# Patient Record
Sex: Female | Born: 1960 | Race: White | Hispanic: No
Health system: Southern US, Community
[De-identification: ages and names within clinical notes are randomized; demographics above are authoritative.]

## PROBLEM LIST (undated history)

## (undated) DIAGNOSIS — F419 Anxiety disorder, unspecified: Secondary | ICD-10-CM

## (undated) DIAGNOSIS — R7303 Prediabetes: Secondary | ICD-10-CM

## (undated) DIAGNOSIS — J449 Chronic obstructive pulmonary disease, unspecified: Secondary | ICD-10-CM

## (undated) DIAGNOSIS — I10 Essential (primary) hypertension: Secondary | ICD-10-CM

## (undated) DIAGNOSIS — E7800 Pure hypercholesterolemia, unspecified: Secondary | ICD-10-CM

## (undated) DIAGNOSIS — K219 Gastro-esophageal reflux disease without esophagitis: Secondary | ICD-10-CM

## (undated) DIAGNOSIS — J45909 Unspecified asthma, uncomplicated: Secondary | ICD-10-CM

## (undated) HISTORY — PX: TUBAL LIGATION: SHX77

---

## 2002-07-17 ENCOUNTER — Emergency Department (HOSPITAL_COMMUNITY): Admission: EM | Admit: 2002-07-17 | Discharge: 2002-07-18 | Payer: Self-pay | Admitting: Emergency Medicine

## 2002-08-24 ENCOUNTER — Encounter: Payer: Self-pay | Admitting: Emergency Medicine

## 2002-08-24 ENCOUNTER — Emergency Department (HOSPITAL_COMMUNITY): Admission: EM | Admit: 2002-08-24 | Discharge: 2002-08-24 | Payer: Self-pay | Admitting: Emergency Medicine

## 2002-10-20 ENCOUNTER — Emergency Department (HOSPITAL_COMMUNITY): Admission: EM | Admit: 2002-10-20 | Discharge: 2002-10-20 | Payer: Self-pay | Admitting: Emergency Medicine

## 2002-11-06 ENCOUNTER — Emergency Department (HOSPITAL_COMMUNITY): Admission: AD | Admit: 2002-11-06 | Discharge: 2002-11-06 | Payer: Self-pay | Admitting: Emergency Medicine

## 2002-11-19 ENCOUNTER — Emergency Department (HOSPITAL_COMMUNITY): Admission: EM | Admit: 2002-11-19 | Discharge: 2002-11-20 | Payer: Self-pay | Admitting: Emergency Medicine

## 2004-08-19 ENCOUNTER — Emergency Department (HOSPITAL_COMMUNITY): Admission: EM | Admit: 2004-08-19 | Discharge: 2004-08-19 | Payer: Self-pay | Admitting: Emergency Medicine

## 2004-08-31 ENCOUNTER — Emergency Department (HOSPITAL_COMMUNITY): Admission: EM | Admit: 2004-08-31 | Discharge: 2004-09-01 | Payer: Self-pay | Admitting: Emergency Medicine

## 2004-09-24 ENCOUNTER — Emergency Department (HOSPITAL_COMMUNITY): Admission: EM | Admit: 2004-09-24 | Discharge: 2004-09-24 | Payer: Self-pay | Admitting: Emergency Medicine

## 2017-10-24 ENCOUNTER — Encounter (HOSPITAL_COMMUNITY): Payer: Self-pay

## 2017-10-24 ENCOUNTER — Other Ambulatory Visit: Payer: Self-pay

## 2017-10-24 ENCOUNTER — Emergency Department (HOSPITAL_COMMUNITY)
Admission: EM | Admit: 2017-10-24 | Discharge: 2017-10-24 | Disposition: A | Discharge status: Home / Self Care | Payer: Medicare Other | Attending: Emergency Medicine | Admitting: Emergency Medicine

## 2017-10-24 DIAGNOSIS — Z5321 Procedure and treatment not carried out due to patient leaving prior to being seen by health care provider: Secondary | ICD-10-CM | POA: Diagnosis not present

## 2017-10-24 DIAGNOSIS — M79672 Pain in left foot: Secondary | ICD-10-CM | POA: Insufficient documentation

## 2017-10-24 HISTORY — DX: Essential (primary) hypertension: I10

## 2017-10-24 HISTORY — DX: Unspecified asthma, uncomplicated: J45.909

## 2017-10-24 HISTORY — DX: Pure hypercholesterolemia, unspecified: E78.00

## 2017-10-24 NOTE — ED Triage Notes (Addendum)
Pt endorses her left foot sliding off of her motorized wheelchair yesterday by accident and twisting her left ankle. Swelling noted and unable to bear full weight on it. PMS intact. Pt went to [facility] yesterday and was told ankle x-ray was negative. VSS.

## 2017-10-24 NOTE — ED Notes (Signed)
Called pt to check vitals. No response.

## 2017-10-28 ENCOUNTER — Other Ambulatory Visit: Payer: Self-pay

## 2017-10-28 ENCOUNTER — Ambulatory Visit (HOSPITAL_COMMUNITY)
Admission: EM | Admit: 2017-10-28 | Discharge: 2017-10-28 | Disposition: A | Discharge status: Home / Self Care | Payer: Medicare Other | Attending: Family Medicine | Admitting: Family Medicine

## 2017-10-28 ENCOUNTER — Encounter (HOSPITAL_COMMUNITY): Payer: Self-pay | Admitting: *Deleted

## 2017-10-28 ENCOUNTER — Ambulatory Visit (INDEPENDENT_AMBULATORY_CARE_PROVIDER_SITE_OTHER): Payer: Medicare Other

## 2017-10-28 DIAGNOSIS — S93402A Sprain of unspecified ligament of left ankle, initial encounter: Secondary | ICD-10-CM

## 2017-10-28 MED ORDER — NAPROXEN 375 MG PO TABS: 375.0000 mg | ORAL_TABLET | Freq: Two times a day (BID) | ORAL | 0 refills | Status: DC

## 2017-10-28 NOTE — ED Triage Notes (Addendum)
Reports getting left foot caught in wheelchair approx 2 wks ago.  States not much pain, but c/o continued intermittent swelling. Pain in left lateral ankle, not foot.

## 2017-10-28 NOTE — Discharge Instructions (Addendum)
Ice and elevate the ankle. Naproxen twice a day, take with food, don't take with additional ibuprofen. Activity as tolerated. See exercises. If worsening or no improvement please follow up with your primary care provider and/or sports medicine.

## 2017-10-28 NOTE — ED Provider Notes (Signed)
[facility]    CSN: [id number] Arrival date & time: 10/28/17  1720     History   Chief Complaint Chief Complaint  Patient presents with  . Foot Injury    HPI Ashley Hodge is a 57 y.o. female.   Ashley Hodge presents with complaints of left lateral ankle pain after injury approximately 6/9. Was seen in ER on 6/10 with negative foot films. Still has swelling. Occasional pain, worse with touching and some pain with ambulatory. States her foot caught under her wheelchair causing it to twist and hyperextend before she could reverse. No numbness or tingling. Pain 6/10. Has taken ibuprofen intermittently which has not helped, none today. No previous ankle or foot injury. Hx of asthma, htn, high cholesterol.   ROS per HPI.      Past Medical History:  Diagnosis Date  . Asthma   . High cholesterol   . Hypertension     There are no active problems to display for this patient.   Past Surgical History:  Procedure Laterality Date  . TUBAL LIGATION      OB History   None      Home Medications    Prior to Admission medications   Medication Sig Start Date End Date Taking? Authorizing Provider  amLODipine (NORVASC) 5 MG tablet Take 5 mg by mouth daily. 09/07/17  Yes [provider]  hydrochlorothiazide (HYDRODIURIL) 12.5 MG tablet Take 12.5 mg by mouth daily. 09/07/17  Yes [provider]  losartan (COZAAR) 50 MG tablet Take 50 mg by mouth daily. 09/07/17  Yes [provider]  pravastatin (PRAVACHOL) 80 MG tablet Take 80 mg by mouth daily. 09/07/17  Yes [provider]  PROAIR HFA 108 (90 Base) MCG/ACT inhaler Inhale 2 puffs into the lungs every 6 (six) hours as needed for wheezing. 09/07/17  Yes [provider]  naproxen (NAPROSYN) 375 MG tablet Take 1 tablet (375 mg total) by mouth 2 (two) times daily. 10/28/17   Georgetta Haber, NP    Family History Family History  Problem Relation Age of Onset  . Heart disease Mother   .  Hypertension Mother     Social History Social History   Tobacco Use  . Smoking status: Current Every Day Smoker    Packs/day: 1.00    Types: Cigarettes  . Smokeless tobacco: Never Used  Substance Use Topics  . Alcohol use: Not Currently    Frequency: Never    Comment: "clean x 4 yrs"  . Drug use: Never     Allergies   Lisinopril   Review of Systems Review of Systems   Physical Exam Triage Vital Signs ED Triage Vitals [10/28/17 1841]  Enc Vitals Group     BP 126/61     Pulse Rate 90     Resp 16     Temp 98.7 F (37.1 C)     Temp Source Oral     SpO2 99 %     Weight      Height      Head Circumference      Peak Flow      Pain Score      Pain Loc      Pain Edu?      Excl. in GC?    No data found.  Updated Vital Signs BP 126/61   Pulse 90   Temp 98.7 F (37.1 C) (Oral)   Resp 16   SpO2 99%   Physical Exam  Constitutional: She is oriented  to person, place, and time. She appears well-developed and well-nourished. No distress.  Cardiovascular: Normal rate, regular rhythm and normal heart sounds.  Pulmonary/Chest: Effort normal and breath sounds normal.  Musculoskeletal:       Left ankle: She exhibits swelling and ecchymosis. She exhibits normal range of motion, no deformity, no laceration and normal pulse. Tenderness. Lateral malleolus tenderness found. Achilles tendon normal.       Left foot: Normal.       Feet:  Tenderness to lateral malleolus and surrounding soft tissue with bruising and swelling noted; Full ROM noted   Neurological: She is alert and oriented to person, place, and time.  Skin: Skin is warm and dry.     UC Treatments / Results  Labs (all labs ordered are listed, but only abnormal results are displayed) Labs Reviewed - No data to display  EKG None  Radiology Dg Ankle Complete Left  Result Date: 10/28/2017 CLINICAL DATA:  Injury 2 weeks ago, lateral malleolar pain. Swelling. EXAM: LEFT ANKLE COMPLETE - 3+ VIEW COMPARISON:   None. FINDINGS: There is no evidence of fracture, dislocation, or joint effusion. There is no evidence of arthropathy or other focal bone abnormality. Soft tissues are unremarkable. Incidental note made of chronic spurring at the dorsal and plantar margins of the posterior calcaneus. IMPRESSION: No acute findings.  No osseous fracture or dislocation. Electronically Signed   By: Bary RichardStan  Maynard M.D.   On: 10/28/2017 19:13    Procedures Procedures (including critical care time)  Medications Ordered in UC Medications - No data to display  Initial Impression / Assessment and Plan / UC Course  I have reviewed the triage vital signs and the nursing notes.  Pertinent labs & imaging results that were available during my care of the patient were reviewed by me and considered in my medical decision making (see chart for details).     Xray without acute findings. Physical and xray consistent with strain. Ice, elevation, brace, ibuprofen for pain control. Patient verbalized understanding and agreeable to plan.    Final Clinical Impressions(s) / UC Diagnoses   Final diagnoses:  Sprain of left ankle, unspecified ligament, initial encounter     Discharge Instructions     Ice and elevate the ankle. Naproxen twice a day, take with food, don't take with additional ibuprofen. Activity as tolerated. See exercises. If worsening or no improvement please follow up with your primary care provider and/or sports medicine.     ED Prescriptions    Medication Sig Dispense Auth. Provider   naproxen (NAPROSYN) 375 MG tablet Take 1 tablet (375 mg total) by mouth 2 (two) times daily. 20 tablet Georgetta HaberBurky, Natalie B, NP     Controlled Substance Prescriptions [place] Controlled Substance Registry consulted? Not Applicable   Georgetta HaberBurky, Natalie B, NP 10/28/17 [phone]

## 2017-12-14 NOTE — ED Provider Notes (Addendum)
  [facility]   Error      Doreene ElandEniola, Azalynn Maxim T, MD 12/14/17 [phone]

## 2017-12-25 ENCOUNTER — Encounter (HOSPITAL_COMMUNITY): Payer: Self-pay | Admitting: Emergency Medicine

## 2017-12-25 ENCOUNTER — Other Ambulatory Visit: Payer: Self-pay

## 2017-12-25 ENCOUNTER — Emergency Department (HOSPITAL_COMMUNITY)
Admission: EM | Admit: 2017-12-25 | Discharge: 2017-12-25 | Disposition: A | Discharge status: Home / Self Care | Payer: Medicare Other | Attending: Emergency Medicine | Admitting: Emergency Medicine

## 2017-12-25 DIAGNOSIS — I10 Essential (primary) hypertension: Secondary | ICD-10-CM | POA: Diagnosis not present

## 2017-12-25 DIAGNOSIS — Z79899 Other long term (current) drug therapy: Secondary | ICD-10-CM | POA: Insufficient documentation

## 2017-12-25 DIAGNOSIS — F419 Anxiety disorder, unspecified: Secondary | ICD-10-CM | POA: Insufficient documentation

## 2017-12-25 DIAGNOSIS — F17210 Nicotine dependence, cigarettes, uncomplicated: Secondary | ICD-10-CM | POA: Diagnosis not present

## 2017-12-25 DIAGNOSIS — J45909 Unspecified asthma, uncomplicated: Secondary | ICD-10-CM | POA: Insufficient documentation

## 2017-12-25 NOTE — Discharge Instructions (Addendum)
Please read attached information. If you experience any new or worsening signs or symptoms please return to the emergency room for evaluation. Please follow-up with your primary care provider or specialist as discussed.  °

## 2017-12-25 NOTE — ED Provider Notes (Signed)
[facility] Provider Note   CSN: [id number] Arrival date & time: 12/25/17  1726     History   Chief Complaint Chief Complaint  Patient presents with  . Tachycardia  . Hypertension  . Anxiety    HPI Ashley Hodge is a 57 y.o. female.  HPI    57 year old female presents today with complaints of anxiety.  Patient notes her blood pressure was elevated today along with her heart rate.  She notes this was after reading effects on medications that were prescribed to her.  Patient also notes that she is homeless and has significant anxiety about this.  Patient notes a history of same in the past, she has had cardiac work-ups for similar presentations with no significant findings.  She denies any present chest pain or shortness of breath.  No  history of DVT in the past.   Past Medical History:  Diagnosis Date  . Asthma   . High cholesterol   . Hypertension     There are no active problems to display for this patient.   Past Surgical History:  Procedure Laterality Date  . TUBAL LIGATION       OB History   None      Home Medications    Prior to Admission medications   Medication Sig Start Date End Date Taking? Authorizing Provider  amLODipine (NORVASC) 5 MG tablet Take 5 mg by mouth daily. 09/07/17   [provider]  hydrochlorothiazide (HYDRODIURIL) 12.5 MG tablet Take 12.5 mg by mouth daily. 09/07/17   [provider]  losartan (COZAAR) 50 MG tablet Take 50 mg by mouth daily. 09/07/17   [provider]  naproxen (NAPROSYN) 375 MG tablet Take 1 tablet (375 mg total) by mouth 2 (two) times daily. 10/28/17   Georgetta HaberBurky, Natalie B, NP  pravastatin (PRAVACHOL) 80 MG tablet Take 80 mg by mouth daily. 09/07/17   [provider]  PROAIR HFA 108 (90 Base) MCG/ACT inhaler Inhale 2 puffs into the lungs every 6 (six) hours as needed for wheezing. 09/07/17   [provider]    Family History Family History   Problem Relation Age of Onset  . Heart disease Mother   . Hypertension Mother     Social History Social History   Tobacco Use  . Smoking status: Current Every Day Smoker    Packs/day: 1.00    Types: Cigarettes  . Smokeless tobacco: Never Used  Substance Use Topics  . Alcohol use: Not Currently    Frequency: Never    Comment: "clean x 4 yrs"  . Drug use: Never     Allergies   Lisinopril   Review of Systems Review of Systems  All other systems reviewed and are negative.    Physical Exam Updated Vital Signs BP 112/70 (BP Location: Left Arm)   Pulse 83   Temp 97.7 F (36.5 C) (Oral)   Resp 15   Ht 5' (1.524 m)   Wt 67.1 kg   SpO2 95%   BMI 28.90 kg/m   Physical Exam  Constitutional: She is oriented to person, place, and time. She appears well-developed and well-nourished.  HENT:  Head: Normocephalic and atraumatic.  Eyes: Pupils are equal, round, and reactive to light. Conjunctivae are normal. Right eye exhibits no discharge. Left eye exhibits no discharge. No scleral icterus.  Neck: Normal range of motion. No JVD present. No tracheal deviation present.  Cardiovascular: Normal rate, regular rhythm, normal heart sounds and intact distal pulses.  Pulmonary/Chest: Effort normal and breath sounds normal. No stridor. No respiratory distress. She has no wheezes. She has no rales. She exhibits no tenderness.  Neurological: She is alert and oriented to person, place, and time. Coordination normal.  Psychiatric: She has a normal mood and affect. Her behavior is normal. Judgment and thought content normal.  Nursing note and vitals reviewed.    ED Treatments / Results  Labs (all labs ordered are listed, but only abnormal results are displayed) Labs Reviewed - No data to display  EKG None  Radiology No results found.  Procedures Procedures (including critical care time)  Medications Ordered in ED Medications - No data to display   Initial Impression /  Assessment and Plan / ED Course  I have reviewed the triage vital signs and the nursing notes.  Pertinent labs & imaging results that were available during my care of the patient were reviewed by me and considered in my medical decision making (see chart for details).      Final Clinical Impressions(s) / ED Diagnoses   Final diagnoses:  Anxiety    Labs:   Imaging:  Consults:  Therapeutics:  Discharge Meds:   Assessment/Plan: 57 year old female presents today with complaints of anxiety. She has no pulmonary or cardiac etiology here today, vital signs are reassuring. Patient discharged with outpatient follow-up and return precautions. She verbalized understanding and agreement to today's plan.     ED Discharge Orders    None       Rosalio LoudHedges, Carlise Stofer, PA-C 12/25/17 2159    Loren RacerYelverton, David, MD 12/28/17 1536

## 2017-12-25 NOTE — ED Triage Notes (Addendum)
Pt checked her BP and pulse at home and noticed it was high. Pt reports BP 141/82 and P 104. Pt denies pain. Pt believes this is anxiety. Pt is homeless.

## 2017-12-27 ENCOUNTER — Encounter (HOSPITAL_COMMUNITY): Payer: Self-pay

## 2017-12-27 ENCOUNTER — Emergency Department (HOSPITAL_COMMUNITY)
Admission: EM | Admit: 2017-12-27 | Discharge: 2017-12-27 | Disposition: A | Discharge status: Home / Self Care | Payer: Medicare Other | Attending: Emergency Medicine | Admitting: Emergency Medicine

## 2017-12-27 ENCOUNTER — Emergency Department (HOSPITAL_COMMUNITY): Payer: Medicare Other

## 2017-12-27 ENCOUNTER — Other Ambulatory Visit: Payer: Self-pay

## 2017-12-27 DIAGNOSIS — F17210 Nicotine dependence, cigarettes, uncomplicated: Secondary | ICD-10-CM | POA: Insufficient documentation

## 2017-12-27 DIAGNOSIS — M546 Pain in thoracic spine: Secondary | ICD-10-CM

## 2017-12-27 DIAGNOSIS — J45909 Unspecified asthma, uncomplicated: Secondary | ICD-10-CM | POA: Insufficient documentation

## 2017-12-27 DIAGNOSIS — I10 Essential (primary) hypertension: Secondary | ICD-10-CM | POA: Diagnosis not present

## 2017-12-27 DIAGNOSIS — Z79899 Other long term (current) drug therapy: Secondary | ICD-10-CM | POA: Diagnosis not present

## 2017-12-27 MED ORDER — IBUPROFEN 600 MG PO TABS: 600.0000 mg | ORAL_TABLET | Freq: Three times a day (TID) | ORAL | 0 refills | Status: DC | PRN

## 2017-12-27 MED ORDER — IBUPROFEN 200 MG PO TABS
600.0000 mg | ORAL_TABLET | Freq: Once | ORAL | Status: AC
  Administered 2017-12-27: 600 mg via ORAL
  Filled 2017-12-27: qty 3

## 2017-12-27 NOTE — ED Notes (Signed)
Patient transported to X-ray 

## 2017-12-27 NOTE — ED Provider Notes (Signed)
[facility]-EMERGENCY DEPT Provider Note   CSN: [id number] Arrival date & time: 12/27/17  1016     History   Chief Complaint Chief Complaint  Patient presents with  . Back Pain  . Shoulder Pain    HPI Ashley Hodge is a 57 y.o. female.  HPI  57 year old female presents with left-sided back pain.  Started this morning around 7 AM.  She was previously up and feeling no pain when it seemed all of a sudden start.  She is wondering if she slept on her back wrong as she frequently sleeps on her left side.  She is also having some neck pain.  There is no pleuritic pain and it does seem a little worse with movement.  She has a chronic cough from smoking but no new cough or shortness of breath.  No chest pain.  No weakness or numbness in her extremities.  After this started she took a muscle relaxer that she does not know the name of at her motel but states that it has not helped and made her little nauseated.  No urinary symptoms.  She has also been feeling on and off anxious, currently at this time I am talking to her she states she is not feeling anxious but it seems to come and go without obvious cause.  No suicidal thoughts.  Past Medical History:  Diagnosis Date  . Asthma   . High cholesterol   . Hypertension     There are no active problems to display for this patient.   Past Surgical History:  Procedure Laterality Date  . TUBAL LIGATION       OB History   None      Home Medications    Prior to Admission medications   Medication Sig Start Date End Date Taking? Authorizing Provider  amLODipine (NORVASC) 5 MG tablet Take 5 mg by mouth daily. 09/07/17   [provider]  hydrochlorothiazide (HYDRODIURIL) 12.5 MG tablet Take 12.5 mg by mouth daily. 09/07/17   [provider]  losartan (COZAAR) 50 MG tablet Take 50 mg by mouth daily. 09/07/17   [provider]  naproxen (NAPROSYN) 375 MG tablet Take 1 tablet (375 mg total) by mouth  2 (two) times daily. 10/28/17   Georgetta Haber, NP  pravastatin (PRAVACHOL) 80 MG tablet Take 80 mg by mouth daily. 09/07/17   [provider]  PROAIR HFA 108 (90 Base) MCG/ACT inhaler Inhale 2 puffs into the lungs every 6 (six) hours as needed for wheezing. 09/07/17   [provider]    Family History Family History  Problem Relation Age of Onset  . Heart disease Mother   . Hypertension Mother     Social History Social History   Tobacco Use  . Smoking status: Current Every Day Smoker    Packs/day: 1.00    Types: Cigarettes  . Smokeless tobacco: Never Used  Substance Use Topics  . Alcohol use: Not Currently    Frequency: Never    Comment: "clean x 4 yrs"  . Drug use: Never     Allergies   Lisinopril   Review of Systems Review of Systems  Respiratory: Positive for cough. Negative for shortness of breath.   Cardiovascular: Negative for chest pain.  Gastrointestinal: Positive for nausea. Negative for abdominal pain and vomiting.  Musculoskeletal: Positive for back pain and neck pain.  Neurological: Negative for weakness and numbness.  Psychiatric/Behavioral: Negative for suicidal ideas. The patient is nervous/anxious.   All  other systems reviewed and are negative.    Physical Exam Updated Vital Signs BP 127/80   Pulse (!) 110   Temp 98.5 F (36.9 C) (Oral)   Resp 16   Ht 5' (1.524 m)   Wt 67.1 kg   SpO2 97%   BMI 28.90 kg/m   Physical Exam  Constitutional: She is oriented to person, place, and time. She appears well-developed and well-nourished. No distress.  HENT:  Head: Normocephalic and atraumatic.  Right Ear: External ear normal.  Left Ear: External ear normal.  Nose: Nose normal.  Eyes: Right eye exhibits no discharge. Left eye exhibits no discharge.  Neck: Normal range of motion. Neck supple. Muscular tenderness present.    Cardiovascular: Normal rate, regular rhythm and normal heart sounds.  HR~100  Pulmonary/Chest: Effort  normal and breath sounds normal.  Abdominal: Soft. There is no tenderness. There is no CVA tenderness.  Musculoskeletal:       Cervical back: She exhibits tenderness.       Thoracic back: She exhibits tenderness.       Back:  Neurological: She is alert and oriented to person, place, and time.  5/5 strength in all 4 extremities.   Skin: Skin is warm and dry. She is not diaphoretic.  Nursing note and vitals reviewed.    ED Treatments / Results  Labs (all labs ordered are listed, but only abnormal results are displayed) Labs Reviewed - No data to display  EKG EKG Interpretation  Date/Time:  Wednesday December 27 2017 11:15:29 EDT Ventricular Rate:  83 PR Interval:    QRS Duration: 83 QT Interval:  361 QTC Calculation: 425 R Axis:   15 Text Interpretation:  Sinus rhythm Low voltage, precordial leads Abnormal R-wave progression, early transition tachycardia resolved, otherwise no significant change since Dec 25 2017 Confirmed by Pricilla LovelessGoldston, Noemie Devivo [phone]([id number]) on 12/27/2017 11:19:27 AM   Radiology Dg Chest 2 View  Result Date: 12/27/2017 CLINICAL DATA:  Onset back and left arm pain this morning. EXAM: CHEST - 2 VIEW COMPARISON:  None. FINDINGS: The lungs are clear. Heart size is normal. No pneumothorax or pleural effusion. Aortic atherosclerosis is identified. No acute or focal abnormality. IMPRESSION: No acute disease. Atherosclerosis. Electronically Signed   By: Drusilla Kannerhomas  Dalessio M.D.   On: 12/27/2017 11:10    Procedures Procedures (including critical care time)  Medications Ordered in ED Medications  ibuprofen (ADVIL,MOTRIN) tablet 600 mg (600 mg Oral Given 12/27/17 1054)     Initial Impression / Assessment and Plan / ED Course  I have reviewed the triage vital signs and the nursing notes.  Pertinent labs & imaging results that were available during my care of the patient were reviewed by me and considered in my medical decision making (see chart for details).     Patient's  pain is most likely muscular.  It is worse with range of motion and palpation.  While she was initially tachycardic on arrival, after she has rested her heart rate is now in the 80s.  She does endorse some anxiety just prior to arrival which probably drove up her heart rate.  There is no pleuritic pain and no shortness of breath or hypoxia.  I highly doubt PE.  I do not think work-up for ACS or PE is needed, especially with benign appearing ECG.  Ibuprofen will be added to her muscle relaxer and recommend she follow-up with PCP.  Return precautions.  Final Clinical Impressions(s) / ED Diagnoses   Final diagnoses:  Acute left-sided thoracic  back pain    ED Discharge Orders    None       Pricilla LovelessGoldston, Yesenia Fontenette, MD 12/27/17 1143

## 2017-12-27 NOTE — ED Triage Notes (Signed)
Pt arrives via POV from home. Pt reports that she woke today with upper back and neck pain and left shoulder pain. Pt denies injury or trauma. Pt reports that she is a left sided sleeper and she thought that was contributing to her pain. Pt reports anxiety at this time.

## 2017-12-27 NOTE — Discharge Instructions (Signed)
If your back pain worsens, you develop chest pain, shortness of breath, fever, or weakness or numbness in your arms or legs, abdominal pain, or urinary or bowel incontinence or any other new/concerning symptoms or return to the ER for evaluation.  Otherwise follow-up with your primary care physician.

## 2017-12-28 ENCOUNTER — Emergency Department (HOSPITAL_COMMUNITY)
Admission: EM | Admit: 2017-12-28 | Discharge: 2017-12-28 | Disposition: A | Discharge status: Home / Self Care | Payer: Medicare Other | Attending: Emergency Medicine | Admitting: Emergency Medicine

## 2017-12-28 DIAGNOSIS — F17210 Nicotine dependence, cigarettes, uncomplicated: Secondary | ICD-10-CM | POA: Diagnosis not present

## 2017-12-28 DIAGNOSIS — J45909 Unspecified asthma, uncomplicated: Secondary | ICD-10-CM | POA: Diagnosis not present

## 2017-12-28 DIAGNOSIS — Z79899 Other long term (current) drug therapy: Secondary | ICD-10-CM | POA: Insufficient documentation

## 2017-12-28 DIAGNOSIS — I10 Essential (primary) hypertension: Secondary | ICD-10-CM | POA: Diagnosis not present

## 2017-12-28 DIAGNOSIS — R42 Dizziness and giddiness: Secondary | ICD-10-CM | POA: Diagnosis present

## 2017-12-28 LAB — CBC WITH DIFFERENTIAL/PLATELET
Abs Immature Granulocytes: 0 10*3/uL (ref 0.0–0.1)
Basophils Absolute: 0.1 10*3/uL (ref 0.0–0.1)
Basophils Relative: 1 %
Eosinophils Absolute: 0.4 10*3/uL (ref 0.0–0.7)
Eosinophils Relative: 3 %
HEMATOCRIT: 40.7 % (ref 36.0–46.0)
HEMOGLOBIN: 12.9 g/dL (ref 12.0–15.0)
Immature Granulocytes: 0 %
LYMPHS ABS: 2.1 10*3/uL (ref 0.7–4.0)
LYMPHS PCT: 18 %
MCH: 30.5 pg (ref 26.0–34.0)
MCHC: 31.7 g/dL (ref 30.0–36.0)
MCV: 96.2 fL (ref 78.0–100.0)
MONO ABS: 0.8 10*3/uL (ref 0.1–1.0)
Monocytes Relative: 7 %
Neutro Abs: 8.4 10*3/uL — ABNORMAL HIGH (ref 1.7–7.7)
Neutrophils Relative %: 71 %
Platelets: 293 10*3/uL (ref 150–400)
RBC: 4.23 MIL/uL (ref 3.87–5.11)
RDW: 11.8 % (ref 11.5–15.5)
WBC: 11.9 10*3/uL — ABNORMAL HIGH (ref 4.0–10.5)

## 2017-12-28 LAB — I-STAT TROPONIN, ED
Troponin i, poc: 0 ng/mL (ref 0.00–0.08)
Troponin i, poc: 0 ng/mL (ref 0.00–0.08)

## 2017-12-28 LAB — BASIC METABOLIC PANEL
Anion gap: 7 (ref 5–15)
BUN: 9 mg/dL (ref 6–20)
CHLORIDE: 106 mmol/L (ref 98–111)
CO2: 24 mmol/L (ref 22–32)
CREATININE: 0.76 mg/dL (ref 0.44–1.00)
Calcium: 8.7 mg/dL — ABNORMAL LOW (ref 8.9–10.3)
GFR calc Af Amer: >60 mL/min (ref >60)
GLUCOSE: 104 mg/dL — AB (ref 70–99)
POTASSIUM: 3.6 mmol/L (ref 3.5–5.1)
Sodium: 137 mmol/L (ref 135–145)

## 2017-12-28 LAB — D-DIMER, QUANTITATIVE: D-Dimer, Quant: 0.51 ug/mL-FEU — ABNORMAL HIGH (ref 0.00–0.50)

## 2017-12-28 MED ORDER — SODIUM CHLORIDE 0.9 % IV BOLUS
1000.0000 mL | Freq: Once | INTRAVENOUS | Status: AC
  Administered 2017-12-28: 1000 mL via INTRAVENOUS

## 2017-12-28 NOTE — ED Notes (Signed)
Pt ambulated to lobby after being given d/c paperwork. Pt had no questions or concerns at time of discharge.

## 2017-12-28 NOTE — Discharge Instructions (Addendum)
If your dizziness recurs or you develop chest pain, palpitations, shortness of breath, or any other new/concerning symptoms and return to the ER for evaluation.

## 2017-12-28 NOTE — ED Provider Notes (Signed)
[facility]   CSN: [id number] Arrival date & time: 12/28/17  1417     History   Chief Complaint No chief complaint on file.   HPI Karis JubaKathy Bernardini is a 57 y.o. female.  HPI  57 year old female presents with near syncope.  She states that she was outside in the heat and then when she went back to her motel she went to go take a shower.  After she had gotten up to go take a shower about 5 minutes later she started to feel lightheaded while in the shower.  She thinks overall the lightheadedness lasted 5 minutes.  She did not pass out.  Never felt a room spinning sensation.  Mild headache earlier in the day but none now and no severe or thunderclap headache.  She is had a little bit of shooting pain across her left breast on and off since this started but none now.  No shortness of breath or leg swelling.  She is not sure what made the symptoms improved.  She is currently asymptomatic.  She presented to the hospital yesterday for neck and back pain yesterday but states that went away.  Past Medical History:  Diagnosis Date  . Asthma   . High cholesterol   . Hypertension     There are no active problems to display for this patient.   Past Surgical History:  Procedure Laterality Date  . TUBAL LIGATION       OB History   None      Home Medications    Prior to Admission medications   Medication Sig Start Date End Date Taking? Authorizing Provider  amLODipine (NORVASC) 5 MG tablet Take 5 mg by mouth daily. 09/07/17  Yes [provider]  hydrochlorothiazide (HYDRODIURIL) 12.5 MG tablet Take 12.5 mg by mouth daily. 09/07/17  Yes [provider]  ibuprofen (ADVIL,MOTRIN) 600 MG tablet Take 1 tablet (600 mg total) by mouth every 8 (eight) hours as needed. 12/27/17  Yes Pricilla LovelessGoldston, Alvino Lechuga, MD  losartan (COZAAR) 50 MG tablet Take 50 mg by mouth daily. 09/07/17  Yes [provider]  pravastatin (PRAVACHOL) 80 MG  tablet Take 80 mg by mouth daily. 09/07/17  Yes [provider]  PROAIR HFA 108 (90 Base) MCG/ACT inhaler Inhale 2 puffs into the lungs every 6 (six) hours as needed for wheezing. 09/07/17  Yes [provider]  naproxen (NAPROSYN) 375 MG tablet Take 1 tablet (375 mg total) by mouth 2 (two) times daily. Patient not taking: Reported on 12/28/2017 10/28/17   Georgetta HaberBurky, Natalie B, NP    Family History Family History  Problem Relation Age of Onset  . Heart disease Mother   . Hypertension Mother     Social History Social History   Tobacco Use  . Smoking status: Current Every Day Smoker    Packs/day: 1.00    Types: Cigarettes  . Smokeless tobacco: Never Used  Substance Use Topics  . Alcohol use: Not Currently    Frequency: Never    Comment: "clean x 4 yrs"  . Drug use: Never     Allergies   Lisinopril   Review of Systems Review of Systems  Constitutional: Negative for fever.  Respiratory: Negative for shortness of breath.   Cardiovascular: Positive for chest pain. Negative for leg swelling.  Gastrointestinal: Negative for abdominal pain, diarrhea and vomiting.  Musculoskeletal: Negative for back pain and neck pain.  Neurological: Positive for light-headedness.  All other systems reviewed and are  negative.    Physical Exam Updated Vital Signs BP 106/71   Pulse 85   Temp 97.9 F (36.6 C) (Oral)   Resp 16   SpO2 92%   Physical Exam  Constitutional: She is oriented to person, place, and time. She appears well-developed and well-nourished. No distress.  Sitting up in stretcher, resting comfortably. Playing a game on her phone  HENT:  Head: Normocephalic and atraumatic.  Right Ear: External ear normal.  Left Ear: External ear normal.  Nose: Nose normal.  Eyes: EOM are normal. Right eye exhibits no discharge. Left eye exhibits no discharge.  Cardiovascular: Regular rhythm and normal heart sounds. Tachycardia present.  Pulmonary/Chest: Effort normal and  breath sounds normal. She exhibits no tenderness.  Abdominal: Soft. She exhibits no distension. There is no tenderness.  Neurological: She is alert and oriented to person, place, and time.  CN 3-12 grossly intact. 5/5 strength in all 4 extremities. Grossly normal sensation. Normal finger to nose.   Skin: Skin is warm and dry. She is not diaphoretic.  Nursing note and vitals reviewed.    ED Treatments / Results  Labs (all labs ordered are listed, but only abnormal results are displayed) Labs Reviewed  BASIC METABOLIC PANEL - Abnormal; Notable for the following components:      Result Value   Glucose, Bld 104 (*)    Calcium 8.7 (*)    All other components within normal limits  CBC WITH DIFFERENTIAL/PLATELET - Abnormal; Notable for the following components:   WBC 11.9 (*)    Neutro Abs 8.4 (*)    All other components within normal limits  D-DIMER, QUANTITATIVE (NOT AT [facility]) - Abnormal; Notable for the following components:   D-Dimer, Quant 0.51 (*)    All other components within normal limits  I-STAT TROPONIN, ED  I-STAT TROPONIN, ED    EKG EKG Interpretation  Date/Time:  Thursday December 28 2017 14:47:11 EDT Ventricular Rate:  84 PR Interval:    QRS Duration: 94 QT Interval:  367 QTC Calculation: 434 R Axis:   -5 Text Interpretation:  Normal sinus rhythm Low voltage, precordial leads Baseline wander in lead(s) II III aVR aVF no acute ST/T changes no significant change since yesterday. Confirmed by Pricilla Loveless [phone]) on 12/28/2017 3:33:03 PM   Radiology Dg Chest 2 View  Result Date: 12/27/2017 CLINICAL DATA:  Onset back and left arm pain this morning. EXAM: CHEST - 2 VIEW COMPARISON:  None. FINDINGS: The lungs are clear. Heart size is normal. No pneumothorax or pleural effusion. Aortic atherosclerosis is identified. No acute or focal abnormality. IMPRESSION: No acute disease. Atherosclerosis. Electronically Signed   By: Drusilla Kanner M.D.   On: 12/27/2017 11:10     Procedures Procedures (including critical care time)  Medications Ordered in ED Medications  sodium chloride 0.9 % bolus 1,000 mL (0 mLs Intravenous Stopped 12/28/17 1643)     Initial Impression / Assessment and Plan / ED Course  I have reviewed the triage vital signs and the nursing notes.  Pertinent labs & imaging results that were available during my care of the patient were reviewed by me and considered in my medical decision making (see chart for details).     Feels well while here. Neuro exam benign. No thunderclap or severe headache. Age adjusted ddimer negative, and with no hypoxia, increased work of breathing or dyspnea, I think PE is unlikely in this otherwise low risk patient. Doubt this is cause of syncope. Doubt ACS, CP is vague with benign  ECG. Troponins negative x 2. Doubt CNS cause of her transient dizziness. No arrhythmias seen, and I doubt she had a cardiac cause of near-syncope. Will d/c home, f/u with PCP. Return precautions.  Final Clinical Impressions(s) / ED Diagnoses   Final diagnoses:  Dizziness    ED Discharge Orders    None       Pricilla LovelessGoldston, Jackelynn Hosie, MD 12/28/17 2140

## 2017-12-28 NOTE — ED Triage Notes (Signed)
Pt arrived from local hotel after calling for dizziness lasting 15 mins following a shower. Pt had no other symptoms. Pt is alert and oriented x4 at time of triage. Pt has hx of anxiety but states she has not been taking her medications for tx. Pt stated she was concerned with increased HR after shower and therefore called 911. EMS v/s 154/74, hr 110, cbg 155. Denies DM hx.

## 2017-12-29 ENCOUNTER — Ambulatory Visit (HOSPITAL_COMMUNITY)
Admission: EM | Admit: 2017-12-29 | Discharge: 2017-12-29 | Disposition: A | Discharge status: Home / Self Care | Payer: Medicare Other | Attending: Family Medicine | Admitting: Family Medicine

## 2017-12-29 ENCOUNTER — Encounter (HOSPITAL_COMMUNITY): Payer: Self-pay

## 2017-12-29 DIAGNOSIS — I10 Essential (primary) hypertension: Secondary | ICD-10-CM | POA: Diagnosis not present

## 2017-12-29 DIAGNOSIS — E7800 Pure hypercholesterolemia, unspecified: Secondary | ICD-10-CM | POA: Insufficient documentation

## 2017-12-29 DIAGNOSIS — Z791 Long term (current) use of non-steroidal anti-inflammatories (NSAID): Secondary | ICD-10-CM | POA: Diagnosis not present

## 2017-12-29 DIAGNOSIS — N3090 Cystitis, unspecified without hematuria: Secondary | ICD-10-CM | POA: Diagnosis present

## 2017-12-29 DIAGNOSIS — Z780 Asymptomatic menopausal state: Secondary | ICD-10-CM | POA: Diagnosis not present

## 2017-12-29 DIAGNOSIS — Z888 Allergy status to other drugs, medicaments and biological substances status: Secondary | ICD-10-CM | POA: Diagnosis not present

## 2017-12-29 DIAGNOSIS — Z8249 Family history of ischemic heart disease and other diseases of the circulatory system: Secondary | ICD-10-CM | POA: Diagnosis not present

## 2017-12-29 DIAGNOSIS — N390 Urinary tract infection, site not specified: Secondary | ICD-10-CM | POA: Diagnosis not present

## 2017-12-29 DIAGNOSIS — Z79899 Other long term (current) drug therapy: Secondary | ICD-10-CM | POA: Diagnosis not present

## 2017-12-29 DIAGNOSIS — J45909 Unspecified asthma, uncomplicated: Secondary | ICD-10-CM | POA: Diagnosis not present

## 2017-12-29 DIAGNOSIS — Z9889 Other specified postprocedural states: Secondary | ICD-10-CM | POA: Diagnosis not present

## 2017-12-29 DIAGNOSIS — R8281 Pyuria: Secondary | ICD-10-CM

## 2017-12-29 DIAGNOSIS — F17210 Nicotine dependence, cigarettes, uncomplicated: Secondary | ICD-10-CM | POA: Insufficient documentation

## 2017-12-29 MED ORDER — CEPHALEXIN 500 MG PO CAPS: 500.0000 mg | ORAL_CAPSULE | Freq: Two times a day (BID) | ORAL | 0 refills | Status: DC

## 2017-12-29 MED ORDER — CEPHALEXIN 500 MG PO CAPS: 500.0000 mg | ORAL_CAPSULE | Freq: Two times a day (BID) | ORAL | 0 refills | Status: AC

## 2017-12-29 NOTE — ED Provider Notes (Signed)
[facility]   CC: UTI  SUBJECTIVE:  Ashley Hodge is a 57 y.o. female who complains of left flank pain and dysuria that began today.  Patient denies a precipitating event, recent sexual encounter, excessive caffeine intake.  Localizes the pain to the left flank.  Pain is intermittent and described as dull.  Has NOT tried OTC medications.  Symptoms are made worse with urination.  Admits to similar symptoms in the past and treated with antibiotics one month ago.  Denies fever, chills, nausea, vomiting, flank pain, abnormal vaginal discharge or bleeding, hematuria.    LMP: No LMP recorded. Patient is postmenopausal.  ROS: As in HPI.  Past Medical History:  Diagnosis Date  . Asthma   . High cholesterol   . Hypertension    Past Surgical History:  Procedure Laterality Date  . TUBAL LIGATION     Allergies  Allergen Reactions  . Lisinopril Other (See Comments)    cough   No current facility-administered medications on file prior to encounter.    Current Outpatient Medications on File Prior to Encounter  Medication Sig Dispense Refill  . amLODipine (NORVASC) 5 MG tablet Take 5 mg by mouth daily.  3  . hydrochlorothiazide (HYDRODIURIL) 12.5 MG tablet Take 12.5 mg by mouth daily.  3  . losartan (COZAAR) 50 MG tablet Take 50 mg by mouth daily.  3  . pravastatin (PRAVACHOL) 80 MG tablet Take 80 mg by mouth daily.  3  . ibuprofen (ADVIL,MOTRIN) 600 MG tablet Take 1 tablet (600 mg total) by mouth every 8 (eight) hours as needed. 30 tablet 0  . naproxen (NAPROSYN) 375 MG tablet Take 1 tablet (375 mg total) by mouth 2 (two) times daily. (Patient not taking: Reported on 12/28/2017) 20 tablet 0  . PROAIR HFA 108 (90 Base) MCG/ACT inhaler Inhale 2 puffs into the lungs every 6 (six) hours as needed for wheezing.  11   Social History   Socioeconomic History  . Marital status: Legally Separated    Spouse name: Not on file  . Number of children: Not on file  . Years of education: Not  on file  . Highest education level: Not on file  Occupational History  . Not on file  Social Needs  . Financial resource strain: Not on file  . Food insecurity:    Worry: Not on file    Inability: Not on file  . Transportation needs:    Medical: Not on file    Non-medical: Not on file  Tobacco Use  . Smoking status: Current Every Day Smoker    Packs/day: 1.00    Types: Cigarettes  . Smokeless tobacco: Never Used  Substance and Sexual Activity  . Alcohol use: Not Currently    Frequency: Never    Comment: "clean x 4 yrs"  . Drug use: Never  . Sexual activity: Not on file  Lifestyle  . Physical activity:    Days per week: Not on file    Minutes per session: Not on file  . Stress: Not on file  Relationships  . Social connections:    Talks on phone: Not on file    Gets together: Not on file    Attends religious service: Not on file    Active member of club or organization: Not on file    Attends meetings of clubs or organizations: Not on file    Relationship status: Not on file  . Intimate partner violence:    Fear of current or ex  partner: Not on file    Emotionally abused: Not on file    Physically abused: Not on file    Forced sexual activity: Not on file  Other Topics Concern  . Not on file  Social History Narrative  . Not on file   Family History  Problem Relation Age of Onset  . Heart disease Mother   . Hypertension Mother     OBJECTIVE:  Vitals:   12/29/17 1910  BP: 137/67  Pulse: 97  Resp: 18  Temp: 98.5 F (36.9 C)  SpO2: 100%   General appearance: AOx3 in no acute distress; sitting in motorized wheelchair HEENT: NCAT.  Oropharynx clear.  Lungs: clear to auscultation bilaterally without adventitious breath sounds Heart: regular rate and rhythm.  Radial pulses 2+ symmetrical bilaterally Abdomen: soft; non-distended; NT to palpation; bowel sounds present; no guarding Back: no CVA tenderness Extremities: no edema; symmetrical with no gross  deformities Skin: warm and dry Psychological: alert and cooperative; anxious and tearful at conclusion of encounter   Labs Reviewed  URINE CULTURE    ASSESSMENT & PLAN:  1. Pyuria   2. Cystitis     Meds ordered this encounter  Medications  . cephALEXin (KEFLEX) 500 MG capsule    Sig: Take 1 capsule (500 mg total) by mouth 2 (two) times daily for 7 days.    Dispense:  14 capsule    Refill:  0    Order Specific Question:   Supervising Provider    Answer:   Isa RankinMURRAY, LAURA WILSON [[id number]]    Urine culture sent.  We will call you with the results.   Push fluids and get plenty of rest.   Take antibiotic as directed and to completion Follow up with here or with PCP if symptoms persists Return here or go to ER if you have any new or worsening symptoms such as fever, worsening abdominal pain, nausea/vomiting, flank pain, etc...  Outlined signs and symptoms indicating need for more acute intervention. Patient verbalized understanding. After Visit Summary given.     Rennis HardingWurst, Hermela Hardt, PA-C 12/29/17 2035

## 2017-12-29 NOTE — ED Triage Notes (Signed)
Pt presents with left flank pain and dysuria. History of uti, unsure if it cleared up.

## 2017-12-29 NOTE — ED Notes (Signed)
Urinalysis testing exceeded the limitations of the Clinitex capabilties

## 2017-12-29 NOTE — Discharge Instructions (Addendum)
Urine showed signs of infection. Urine culture sent.  We will call you with the results.   Push fluids and get plenty of rest.   Take antibiotic as directed and to completion Follow up here or with PCP if symptoms persists Return here or go to ER if you have any new or worsening symptoms such as fever, worsening abdominal pain, nausea/vomiting, flank pain, etc...Marland Kitchen

## 2017-12-30 ENCOUNTER — Emergency Department (HOSPITAL_COMMUNITY)
Admission: EM | Admit: 2017-12-30 | Discharge: 2017-12-31 | Discharge status: Against Medical Advice | Payer: Medicare Other | Source: Home / Self Care

## 2017-12-31 ENCOUNTER — Emergency Department (HOSPITAL_COMMUNITY)
Admission: EM | Admit: 2017-12-31 | Discharge: 2017-12-31 | Discharge status: Against Medical Advice | Payer: Medicare Other | Attending: Emergency Medicine | Admitting: Emergency Medicine

## 2017-12-31 ENCOUNTER — Encounter (HOSPITAL_COMMUNITY): Payer: Self-pay | Admitting: Emergency Medicine

## 2017-12-31 DIAGNOSIS — F17210 Nicotine dependence, cigarettes, uncomplicated: Secondary | ICD-10-CM | POA: Diagnosis not present

## 2017-12-31 DIAGNOSIS — R1013 Epigastric pain: Secondary | ICD-10-CM | POA: Diagnosis not present

## 2017-12-31 DIAGNOSIS — I10 Essential (primary) hypertension: Secondary | ICD-10-CM | POA: Insufficient documentation

## 2017-12-31 DIAGNOSIS — Z79899 Other long term (current) drug therapy: Secondary | ICD-10-CM | POA: Diagnosis not present

## 2017-12-31 DIAGNOSIS — J45909 Unspecified asthma, uncomplicated: Secondary | ICD-10-CM | POA: Diagnosis not present

## 2017-12-31 LAB — COMPREHENSIVE METABOLIC PANEL
ALBUMIN: 3.5 g/dL (ref 3.5–5.0)
ALK PHOS: 88 U/L (ref 38–126)
ALT: 17 U/L (ref 0–44)
ANION GAP: 8 (ref 5–15)
AST: 17 U/L (ref 15–41)
BILIRUBIN TOTAL: 0.5 mg/dL (ref 0.3–1.2)
BUN: 13 mg/dL (ref 6–20)
CALCIUM: 8.8 mg/dL — AB (ref 8.9–10.3)
CO2: 28 mmol/L (ref 22–32)
Chloride: 106 mmol/L (ref 98–111)
Creatinine, Ser: 0.93 mg/dL (ref 0.44–1.00)
GFR calc Af Amer: >60 mL/min (ref >60)
GFR calc non Af Amer: >60 mL/min (ref >60)
GLUCOSE: 128 mg/dL — AB (ref 70–99)
POTASSIUM: 3.6 mmol/L (ref 3.5–5.1)
Sodium: 142 mmol/L (ref 135–145)
Total Protein: 6.4 g/dL — ABNORMAL LOW (ref 6.5–8.1)

## 2017-12-31 LAB — CBC
HEMATOCRIT: 41 % (ref 36.0–46.0)
HEMOGLOBIN: 12.9 g/dL (ref 12.0–15.0)
MCH: 30.6 pg (ref 26.0–34.0)
MCHC: 31.5 g/dL (ref 30.0–36.0)
MCV: 97.4 fL (ref 78.0–100.0)
Platelets: 314 10*3/uL (ref 150–400)
RBC: 4.21 MIL/uL (ref 3.87–5.11)
RDW: 11.6 % (ref 11.5–15.5)
WBC: 10.9 10*3/uL — ABNORMAL HIGH (ref 4.0–10.5)

## 2017-12-31 LAB — URINE CULTURE: CULTURE: NO GROWTH

## 2017-12-31 LAB — I-STAT TROPONIN, ED: Troponin i, poc: 0 ng/mL (ref 0.00–0.08)

## 2017-12-31 LAB — LIPASE, BLOOD: Lipase: 40 U/L (ref 11–51)

## 2017-12-31 LAB — I-STAT BETA HCG BLOOD, ED (MC, WL, AP ONLY): I-stat hCG, quantitative: 5.9 m[IU]/mL — ABNORMAL HIGH (ref <5)

## 2017-12-31 MED ORDER — GI COCKTAIL ~~LOC~~: 30.0000 mL | Freq: Once | ORAL | Status: DC

## 2017-12-31 MED ORDER — ONDANSETRON 4 MG PO TBDP: 4.0000 mg | ORAL_TABLET | Freq: Once | ORAL | Status: DC

## 2017-12-31 NOTE — ED Notes (Signed)
Patient back in ED at this time

## 2017-12-31 NOTE — ED Notes (Signed)
Pt not in room, Dahlia ClientHannah EMT made nurse aware patient left. Dr. Adela LankFloyd notified.

## 2017-12-31 NOTE — ED Triage Notes (Signed)
Patient reports left abdominal pain onset yesterday , denies emesis or diarrhea , no fever or chills , currently taking Cephalexin antibiotic , seen here 2 days ago diagnosed with Cystitis .

## 2017-12-31 NOTE — ED Provider Notes (Signed)
[facility] EMERGENCY DEPARTMENT Provider Note   CSN: [id number] Arrival date & time: 12/31/17  9562     History   Chief Complaint Chief Complaint  Patient presents with  . Abdominal Pain    HPI Ashley Hodge is a 57 y.o. female.  HPI  Patient is a 57 year old female with a history of hypertension, hypercholesterolemia, and asthma presenting for epigastric abdominal pain.  Patient reports symptoms began yesterday and are constant.  Patient describes the pain as sharp.  Patient denies that it radiates into her back or lower abdomen.  Patient reports nausea without vomiting.  Patient reports last bowel movement yesterday and normal for her.  Patient denies any dysuria, urgency, or frequency, or flank pain at this time.  Patient denies vaginal bleeding or vaginal discharge.  Patient denies fever or chills.  Patient was treated for urinary tract infection 2 days ago, and has been taking Keflex twice daily ever since.  Patient reports daily use of 600 mg of ibuprofen, denies any alcohol use or drug use.  Patient does report tobacco use.  Last menses within 5 years.  Patient reports last sexual activity 2 months ago with one female partner.  Of note, patient reports she uses electric scooter for "ligament pain" in her legs, and she has been using it for 1 year.  No recent trauma.  Past Medical History:  Diagnosis Date  . Asthma   . High cholesterol   . Hypertension     There are no active problems to display for this patient.   Past Surgical History:  Procedure Laterality Date  . TUBAL LIGATION       OB History   None      Home Medications    Prior to Admission medications   Medication Sig Start Date End Date Taking? Authorizing Provider  amLODipine (NORVASC) 5 MG tablet Take 5 mg by mouth daily. 09/07/17   [provider]  cephALEXin (KEFLEX) 500 MG capsule Take 1 capsule (500 mg total) by mouth 2 (two) times daily for 7 days. 12/29/17 01/05/18   Wurst, Lowanda Foster, PA-C  hydrochlorothiazide (HYDRODIURIL) 12.5 MG tablet Take 12.5 mg by mouth daily. 09/07/17   [provider]  ibuprofen (ADVIL,MOTRIN) 600 MG tablet Take 1 tablet (600 mg total) by mouth every 8 (eight) hours as needed. 12/27/17   Pricilla Loveless, MD  losartan (COZAAR) 50 MG tablet Take 50 mg by mouth daily. 09/07/17   [provider]  naproxen (NAPROSYN) 375 MG tablet Take 1 tablet (375 mg total) by mouth 2 (two) times daily. Patient not taking: Reported on 12/28/2017 10/28/17   Linus Mako B, NP  pravastatin (PRAVACHOL) 80 MG tablet Take 80 mg by mouth daily. 09/07/17   [provider]  PROAIR HFA 108 (90 Base) MCG/ACT inhaler Inhale 2 puffs into the lungs every 6 (six) hours as needed for wheezing. 09/07/17   [provider]    Family History Family History  Problem Relation Age of Onset  . Heart disease Mother   . Hypertension Mother     Social History Social History   Tobacco Use  . Smoking status: Current Every Day Smoker    Packs/day: 1.00    Types: Cigarettes  . Smokeless tobacco: Never Used  Substance Use Topics  . Alcohol use: Not Currently    Frequency: Never    Comment: "clean x 4 yrs"  . Drug use: Never     Allergies   Lisinopril   Review of Systems  Review of Systems  Constitutional: Negative for chills and fever.  HENT: Negative for congestion and rhinorrhea.   Respiratory: Negative for shortness of breath.   Cardiovascular: Negative for chest pain.  Gastrointestinal: Positive for abdominal pain and nausea. Negative for blood in stool, constipation, diarrhea and vomiting.  Genitourinary: Negative for dysuria, flank pain, frequency, urgency, vaginal bleeding and vaginal discharge.  Musculoskeletal: Negative for back pain.  All other systems reviewed and are negative.    Physical Exam Updated Vital Signs BP 136/74 (BP Location: Left Arm)   Pulse 89   Temp 98.1 F (36.7 C) (Oral)   Resp 16   SpO2  97%   Physical Exam  Constitutional: She appears well-developed and well-nourished. No distress.  HENT:  Head: Normocephalic and atraumatic.  Mouth/Throat: Oropharynx is clear and moist.  Eyes: Pupils are equal, round, and reactive to light. Conjunctivae and EOM are normal.  Neck: Normal range of motion. Neck supple.  Cardiovascular: Normal rate, regular rhythm, S1 normal and S2 normal.  No murmur heard. Pulmonary/Chest: Effort normal and breath sounds normal. She has no wheezes. She has no rales.  Abdominal: Soft. Normal appearance and bowel sounds are normal. She exhibits no distension. There is tenderness in the epigastric area and periumbilical area. There is no guarding.  Epigastric pain in a bandlike pattern without guarding or rebound.  Musculoskeletal: Normal range of motion. She exhibits no edema or deformity.  Lymphadenopathy:    She has no cervical adenopathy.  Neurological: She is alert.  Cranial nerves grossly intact. Patient moves extremities symmetrically and with good coordination.  Skin: Skin is warm and dry. No rash noted. No erythema.  Psychiatric: She has a normal mood and affect. Her behavior is normal. Judgment and thought content normal.  Nursing note and vitals reviewed.    ED Treatments / Results  Labs (all labs ordered are listed, but only abnormal results are displayed) Labs Reviewed  COMPREHENSIVE METABOLIC PANEL - Abnormal; Notable for the following components:      Result Value   Glucose, Bld 128 (*)    Calcium 8.8 (*)    Total Protein 6.4 (*)    All other components within normal limits  CBC - Abnormal; Notable for the following components:   WBC 10.9 (*)    All other components within normal limits  I-STAT BETA HCG BLOOD, ED ([facility], [facility], [facility] ONLY) - Abnormal; Notable for the following components:   I-stat hCG, quantitative 5.9 (*)    All other components within normal limits  LIPASE, BLOOD  URINALYSIS, ROUTINE W REFLEX MICROSCOPIC  URINALYSIS,  ROUTINE W REFLEX MICROSCOPIC  I-STAT TROPONIN, ED  POC URINE PREG, ED    EKG None  Radiology No results found.  Procedures Procedures (including critical care time)  Medications Ordered in ED Medications  ondansetron (ZOFRAN-ODT) disintegrating tablet 4 mg (has no administration in time range)  gi cocktail (Maalox,Lidocaine,Donnatal) (has no administration in time range)     Initial Impression / Assessment and Plan / ED Course  I have reviewed the triage vital signs and the nursing notes.  Pertinent labs & imaging results that were available during my care of the patient were reviewed by me and considered in my medical decision making (see chart for details).  Clinical Course as of Dec 31 701  Wynelle LinkSun Dec 31, 2017  [id number] Patient left room in motorized scooter. Did not notify staff or provider of intent to leave.  Prior to elopement, I did discuss with patient the risks and  benefits of not receiving a full work-up, as well as my differential diagnosis for her symptoms.  Patient was in full understanding and stated that she was "ready to go".   [AM]  0702 Feel this is likely false positive in the setting of postmenopausal for many years.   I-stat hCG, quantitative(!): 5.9 [AM]  0703 Nonspecific. Patient has benign abdomen.   WBC(!): 10.9 [AM]    Clinical Course User Index [AM] Elisha PonderMurray, Breandan People B, PA-C    Patient nontoxic-appearing, afebrile, with benign abdomen.  Effective diagnosis includes gastritis, peptic ulcer disease, cholecystitis, cholelithiasis, gastroenteritis, ACS.  I discussed with patient her lab work, and discussed that rechecking her urinalysis would be prudent given her recent diagnosis of pyuria, the patient reports she would only be able to go she had coffee.  I also discussed with patient that based on age and risk factors, recommend troponin and EKG.  Patient was actively declining further work-up on my exam.  Patient left AGAINST MEDICAL ADVICE as she was  counseled on the risks and benefits of continuing or declining further work-up.  Patient encouraged to return for further work-up should her symptoms worsen prior to her elopement.  Final Clinical Impressions(s) / ED Diagnoses   Final diagnoses:  Epigastric pain  Elevated blood pressure reading with diagnosis of hypertension    ED Discharge Orders    None       Delia ChimesMurray, Taurean Ju B, PA-C 12/31/17 0703    Melene PlanFloyd, Dan, DO 01/02/18 1459

## 2017-12-31 NOTE — ED Notes (Signed)
Patient seen leaving ED lobby

## 2018-01-02 ENCOUNTER — Emergency Department (HOSPITAL_COMMUNITY)
Admission: EM | Admit: 2018-01-02 | Discharge: 2018-01-02 | Disposition: A | Discharge status: Home / Self Care | Payer: Medicare Other | Attending: Emergency Medicine | Admitting: Emergency Medicine

## 2018-01-02 ENCOUNTER — Other Ambulatory Visit: Payer: Self-pay

## 2018-01-02 ENCOUNTER — Emergency Department (HOSPITAL_COMMUNITY): Payer: Medicare Other

## 2018-01-02 ENCOUNTER — Encounter (HOSPITAL_COMMUNITY): Payer: Self-pay | Admitting: Emergency Medicine

## 2018-01-02 DIAGNOSIS — Z79899 Other long term (current) drug therapy: Secondary | ICD-10-CM | POA: Insufficient documentation

## 2018-01-02 DIAGNOSIS — F17210 Nicotine dependence, cigarettes, uncomplicated: Secondary | ICD-10-CM | POA: Diagnosis not present

## 2018-01-02 DIAGNOSIS — R079 Chest pain, unspecified: Secondary | ICD-10-CM | POA: Insufficient documentation

## 2018-01-02 DIAGNOSIS — J449 Chronic obstructive pulmonary disease, unspecified: Secondary | ICD-10-CM | POA: Diagnosis not present

## 2018-01-02 DIAGNOSIS — I10 Essential (primary) hypertension: Secondary | ICD-10-CM | POA: Insufficient documentation

## 2018-01-02 HISTORY — DX: Chronic obstructive pulmonary disease, unspecified: J44.9

## 2018-01-02 LAB — BASIC METABOLIC PANEL
ANION GAP: 12 (ref 5–15)
BUN: 9 mg/dL (ref 6–20)
CALCIUM: 9.7 mg/dL (ref 8.9–10.3)
CHLORIDE: 103 mmol/L (ref 98–111)
CO2: 26 mmol/L (ref 22–32)
Creatinine, Ser: 0.9 mg/dL (ref 0.44–1.00)
GFR calc non Af Amer: >60 mL/min (ref >60)
GLUCOSE: 163 mg/dL — AB (ref 70–99)
POTASSIUM: 3.8 mmol/L (ref 3.5–5.1)
Sodium: 141 mmol/L (ref 135–145)

## 2018-01-02 LAB — CBC
HEMATOCRIT: 42 % (ref 36.0–46.0)
HEMOGLOBIN: 13.4 g/dL (ref 12.0–15.0)
MCH: 30.5 pg (ref 26.0–34.0)
MCHC: 31.9 g/dL (ref 30.0–36.0)
MCV: 95.5 fL (ref 78.0–100.0)
Platelets: 342 10*3/uL (ref 150–400)
RBC: 4.4 MIL/uL (ref 3.87–5.11)
RDW: 11.8 % (ref 11.5–15.5)
WBC: 14.3 10*3/uL — ABNORMAL HIGH (ref 4.0–10.5)

## 2018-01-02 LAB — I-STAT TROPONIN, ED
Troponin i, poc: 0 ng/mL (ref 0.00–0.08)
Troponin i, poc: 0.01 ng/mL (ref 0.00–0.08)

## 2018-01-02 MED ORDER — IBUPROFEN 800 MG PO TABS
800.0000 mg | ORAL_TABLET | Freq: Once | ORAL | Status: AC
  Administered 2018-01-02: 800 mg via ORAL
  Filled 2018-01-02: qty 1

## 2018-01-02 NOTE — ED Notes (Signed)
Pt given gram crackers with peanut butter.  Coke with ice also.

## 2018-01-02 NOTE — ED Provider Notes (Signed)
[facility] EMERGENCY DEPARTMENT Provider Note   CSN: [id number] Arrival date & time: 01/02/18  1004     History   Chief Complaint Chief Complaint  Patient presents with  . Chest Pain    HPI Ashley Hodge is a 57 y.o. female.  The history is provided by the patient.  Chest Pain   This is a new problem. The current episode started 3 to 5 hours ago. The problem has been resolved. The pain is associated with raising an arm and movement. The pain is present in the substernal region. The pain is at a severity of 3/10. The pain is mild. The quality of the pain is described as sharp. The pain does not radiate. The symptoms are aggravated by certain positions. Pertinent negatives include no abdominal pain, no back pain, no cough, no exertional chest pressure, no fever, no leg pain, no lower extremity edema, no malaise/fatigue, no nausea, no palpitations, no PND, no shortness of breath, no sputum production, no vomiting and no weakness. She has tried rest for the symptoms. The treatment provided no relief.  Her past medical history is significant for hyperlipidemia and hypertension.  Pertinent negatives for past medical history include no seizures.    Past Medical History:  Diagnosis Date  . Asthma   . COPD (chronic obstructive pulmonary disease) (HCC)   . High cholesterol   . Hypertension     There are no active problems to display for this patient.   Past Surgical History:  Procedure Laterality Date  . TUBAL LIGATION       OB History   None      Home Medications    Prior to Admission medications   Medication Sig Start Date End Date Taking? Authorizing Provider  amLODipine (NORVASC) 5 MG tablet Take 5 mg by mouth daily. 09/07/17  Yes [provider]  cephALEXin (KEFLEX) 500 MG capsule Take 1 capsule (500 mg total) by mouth 2 (two) times daily for 7 days. 12/29/17 01/05/18 Yes Wurst, [place], PA-C  citalopram (CELEXA) 20 MG tablet Take 20 mg by  mouth daily. 12/25/17  Yes [provider]  hydrochlorothiazide (HYDRODIURIL) 12.5 MG tablet Take 12.5 mg by mouth daily. 09/07/17  Yes [provider]  ibuprofen (ADVIL,MOTRIN) 600 MG tablet Take 1 tablet (600 mg total) by mouth every 8 (eight) hours as needed. Patient taking differently: Take 600 mg by mouth every 8 (eight) hours as needed for fever, headache or mild pain.  12/27/17  Yes Pricilla Loveless, MD  losartan (COZAAR) 50 MG tablet Take 50 mg by mouth daily. 09/07/17  Yes [provider]  pravastatin (PRAVACHOL) 80 MG tablet Take 80 mg by mouth daily. 09/07/17  Yes [provider]    Family History Family History  Problem Relation Age of Onset  . Heart disease Mother   . Hypertension Mother     Social History Social History   Tobacco Use  . Smoking status: Current Every Day Smoker    Packs/day: 1.00    Types: Cigarettes  . Smokeless tobacco: Never Used  Substance Use Topics  . Alcohol use: Not Currently    Frequency: Never    Comment: "clean x 4 yrs"  . Drug use: Never     Allergies   Lisinopril   Review of Systems Review of Systems  Constitutional: Negative for chills, fever and malaise/fatigue.  HENT: Negative for ear pain and sore throat.   Eyes: Negative for pain and visual disturbance.  Respiratory: Negative for  cough, sputum production and shortness of breath.   Cardiovascular: Positive for chest pain. Negative for palpitations and PND.  Gastrointestinal: Negative for abdominal pain, nausea and vomiting.  Genitourinary: Negative for dysuria and hematuria.  Musculoskeletal: Negative for arthralgias and back pain.  Skin: Negative for color change and rash.  Neurological: Negative for seizures, syncope and weakness.  All other systems reviewed and are negative.    Physical Exam Updated Vital Signs  ED Triage Vitals  Enc Vitals Group     BP 01/02/18 1014 125/71     Pulse Rate 01/02/18 1014 (!) 115     Resp 01/02/18  1014 16     Temp 01/02/18 1014 97.8 F (36.6 C)     Temp Source 01/02/18 1014 Oral     SpO2 01/02/18 1014 96 %     Weight --      Height --      Head Circumference --      Peak Flow --      Pain Score 01/02/18 1020 5     Pain Loc --      Pain Edu? --      Excl. in GC? --     Physical Exam  Constitutional: She appears well-developed and well-nourished. No distress.  HENT:  Head: Normocephalic and atraumatic.  Eyes: Pupils are equal, round, and reactive to light. Conjunctivae and EOM are normal.  Neck: Normal range of motion. Neck supple.  Cardiovascular: Normal rate, regular rhythm, intact distal pulses and normal pulses.  No murmur heard. Pulmonary/Chest: Effort normal and breath sounds normal. No respiratory distress. She has no decreased breath sounds. She has no wheezes. She has no rales.  Abdominal: Soft. There is no tenderness.  Musculoskeletal: Normal range of motion. She exhibits no edema.       Right lower leg: She exhibits no edema.       Left lower leg: She exhibits no edema.  Tenderness over the left side of the chest wall  Neurological: She is alert.  Skin: Skin is warm and dry.  Psychiatric: She has a normal mood and affect.  Nursing note and vitals reviewed.    ED Treatments / Results  Labs (all labs ordered are listed, but only abnormal results are displayed) Labs Reviewed  BASIC METABOLIC PANEL - Abnormal; Notable for the following components:      Result Value   Glucose, Bld 163 (*)    All other components within normal limits  CBC - Abnormal; Notable for the following components:   WBC 14.3 (*)    All other components within normal limits  I-STAT TROPONIN, ED  I-STAT TROPONIN, ED    EKG EKG Interpretation  Date/Time:  Tuesday January 02 2018 10:07:58 EDT Ventricular Rate:  110 PR Interval:  132 QRS Duration: 88 QT Interval:  338 QTC Calculation: 457 R Axis:   63 Text Interpretation:  Sinus tachycardia Otherwise normal ECG Confirmed by  Virgina NorfolkAdam, Leler Brion [phone]([id number]) on 01/02/2018 12:08:16 PM   Radiology Dg Chest 2 View  Result Date: 01/02/2018 CLINICAL DATA:  Chest pain. EXAM: CHEST - 2 VIEW COMPARISON:  12/27/2017. FINDINGS: Mediastinum and hilar structures normal. Lungs are clear. No pleural effusion or pneumothorax. Heart size normal. No acute bony abnormality. IMPRESSION: No acute cardiopulmonary disease. Electronically Signed   By: Maisie Fushomas  Register   On: 01/02/2018 10:52    Procedures Procedures (including critical care time)  Medications Ordered in ED Medications  ibuprofen (ADVIL,MOTRIN) tablet 800 mg (800 mg Oral Given 01/02/18 1234)  Initial Impression / Assessment and Plan / ED Course  I have reviewed the triage vital signs and the nursing notes.  Pertinent labs & imaging results that were available during my care of the patient were reviewed by me and considered in my medical decision making (see chart for details).     Karis JubaKathy Guadarrama is a 57 year old female with history of high cholesterol, hypertension, COPD who presents to the ED with chest pain.  Patient with normal vitals.  No fever.  Patient states that she has had some chest discomfort over the last several hours after sleeping on concrete last night.  Patient is homeless.  Patient states that pain is now gone away.  Patient is tender over the left side of the chest wall.  There is no rash.  Patient has some cardiac risk factors but history is not consistent with ACS.  EKG shows sinus tachycardia with no signs of ischemic changes.  Unchanged from prior.  Serial troponins within normal limits and doubt ACS.  Patient is Wells criteria 0 no concern for PE.  Chest x-ray showed no signs of pneumonia, pneumothorax, pleural effusion. Patient w/ clear breath sounds bilaterally, no wheezing, no cough, no sputum production.  Doubt COPD exacerbation.  Patient otherwise with unremarkable electrolytes, kidney function, no signs of anemia.  Patient given Motrin with improvement  of pain.  Suspect patient likely with muscular skeletal chest pain and discharged from ED in good condition.  Told to return to the ED if symptoms worsen.  This chart was dictated using voice recognition software.  Despite best efforts to proofread,  errors can occur which can change the documentation meaning.   Final Clinical Impressions(s) / ED Diagnoses   Final diagnoses:  Chest pain, unspecified type    ED Discharge Orders    None       Virgina NorfolkCuratolo, Cross Jorge, DO 01/02/18 1403

## 2018-01-02 NOTE — ED Triage Notes (Signed)
Patient to ED c/o recurrent central, non-radiating CP x 45 minutes. Reports it is the same as she's had before and was told last time it was not due to her heart. Patient hx COPD, states she still smokes, but denies accompanying SOB, lightheadedness, or N/V. Resp e/u, skin warm/dry. Patient adds that her heart feels like it is racing because she is nervous.

## 2018-01-02 NOTE — ED Notes (Signed)
Discharge instructions discussed with Pt. Pt verbalized understanding. Pt stable and ambulatory.    

## 2018-01-06 ENCOUNTER — Encounter (HOSPITAL_COMMUNITY): Payer: Self-pay | Admitting: Emergency Medicine

## 2018-01-06 ENCOUNTER — Emergency Department (HOSPITAL_COMMUNITY): Payer: Medicare Other

## 2018-01-06 ENCOUNTER — Other Ambulatory Visit: Payer: Self-pay

## 2018-01-06 ENCOUNTER — Emergency Department (HOSPITAL_COMMUNITY)
Admission: EM | Admit: 2018-01-06 | Discharge: 2018-01-06 | Disposition: A | Discharge status: Home / Self Care | Payer: Medicare Other | Attending: Emergency Medicine | Admitting: Emergency Medicine

## 2018-01-06 DIAGNOSIS — M79641 Pain in right hand: Secondary | ICD-10-CM | POA: Diagnosis not present

## 2018-01-06 DIAGNOSIS — Z79899 Other long term (current) drug therapy: Secondary | ICD-10-CM | POA: Diagnosis not present

## 2018-01-06 DIAGNOSIS — I10 Essential (primary) hypertension: Secondary | ICD-10-CM | POA: Diagnosis not present

## 2018-01-06 DIAGNOSIS — F17210 Nicotine dependence, cigarettes, uncomplicated: Secondary | ICD-10-CM | POA: Diagnosis not present

## 2018-01-06 DIAGNOSIS — J449 Chronic obstructive pulmonary disease, unspecified: Secondary | ICD-10-CM | POA: Insufficient documentation

## 2018-01-06 NOTE — ED Triage Notes (Signed)
Pt. Stated, I started rt. Hand pain yesterday.

## 2018-01-06 NOTE — Discharge Instructions (Signed)
Please read and follow all provided instructions.  You have been seen today for right hand pain  Tests performed today include: An x-ray of the affected area - does NOT show any broken bones or dislocations.  Vital signs. See below for your results today.   Home care instructions: -- *PRICE in the first 24-48 hours after injury: Protect (with brace, splint, sling), if given by your provider Rest Ice- Do not apply ice pack directly to your skin, place towel or similar between your skin and ice/ice pack. Apply ice for 20 min, then remove for 40 min while awake Compression- Wear brace, elastic bandage, splint as directed by your provider Elevate affected extremity above the level of your heart when not walking around for the first 24-48 hours   Take 1000mg  of Tylenol every 6 hours as needed for pain. Do not take more then 4000mg  in a 24 hour period.   Follow-up instructions: Please follow-up with your primary care provider if you continue to have significant pain in 1 week. In this case you may have a more severe injury that requires further care.   Return instructions:  Please return if your toes or feet are numb or tingling, appear gray or blue, or you have severe pain (also elevate the leg and loosen splint or wrap if you were given one) Please return to the Emergency Department if you experience worsening symptoms.  Please return if you have any other emergent concerns. Additional Information:  Your vital signs today were: BP 120/70 (BP Location: Left Arm)    Pulse 75    Temp 97.8 F (36.6 C) (Oral)    Resp 18    Ht 5' (1.524 m)    Wt 30.4 kg    SpO2 97%    BMI 13.09 kg/m  If your blood pressure (BP) was elevated above 135/85 this visit, please have this repeated by your doctor within one month. ---------------

## 2018-01-06 NOTE — ED Provider Notes (Signed)
[facility] Provider Note   CSN: [id number] Arrival date & time: 01/06/18  [id number]     History   Chief Complaint Chief Complaint  Patient presents with  . Hand Pain    HPI Ashley Hodge is a 57 y.o. female with a history of hypertension, hyperlipidemia, COPD, asthma, wheelchair dependent who presents the emergent department today for right hand pain that began yesterday.  Patient reports that she started having pain at the base of her right thumb after using her wheelchair yesterday.  She is not sure if she banged it on the wheel or if she overexerted her hand.  Scribes the pain is a constant, achy pain that is worse with range of motion.  She notes is worse with movement of the right thumb however denies any numbness/tingling/weakness.  Patient denies any fever, decreased range of motion, overlying erythema, heat, skin changes, joint swelling or open wounds.  She has been trying over-the-counter medications for symptoms with relief.  She has no other complaints at this time.  HPI  Past Medical History:  Diagnosis Date  . Asthma   . COPD (chronic obstructive pulmonary disease) (HCC)   . High cholesterol   . Hypertension     There are no active problems to display for this patient.   Past Surgical History:  Procedure Laterality Date  . TUBAL LIGATION       OB History   None      Home Medications    Prior to Admission medications   Medication Sig Start Date End Date Taking? Authorizing Provider  amLODipine (NORVASC) 5 MG tablet Take 5 mg by mouth daily. 09/07/17   [provider]  citalopram (CELEXA) 20 MG tablet Take 20 mg by mouth daily. 12/25/17   [provider]  hydrochlorothiazide (HYDRODIURIL) 12.5 MG tablet Take 12.5 mg by mouth daily. 09/07/17   [provider]  ibuprofen (ADVIL,MOTRIN) 600 MG tablet Take 1 tablet (600 mg total) by mouth every 8 (eight) hours as needed. Patient taking differently:  Take 600 mg by mouth every 8 (eight) hours as needed for fever, headache or mild pain.  12/27/17   Pricilla LovelessGoldston, Scott, MD  losartan (COZAAR) 50 MG tablet Take 50 mg by mouth daily. 09/07/17   [provider]  pravastatin (PRAVACHOL) 80 MG tablet Take 80 mg by mouth daily. 09/07/17   [provider]    Family History Family History  Problem Relation Age of Onset  . Heart disease Mother   . Hypertension Mother     Social History Social History   Tobacco Use  . Smoking status: Current Every Day Smoker    Packs/day: 1.00    Types: Cigarettes  . Smokeless tobacco: Never Used  Substance Use Topics  . Alcohol use: Not Currently    Frequency: Never    Comment: "clean x 4 yrs"  . Drug use: Never     Allergies   Lisinopril   Review of Systems Review of Systems  Constitutional: Negative for fever.  Gastrointestinal: Negative for nausea and vomiting.  Musculoskeletal: Positive for arthralgias. Negative for joint swelling.  Skin: Negative for color change and wound.  Neurological: Negative for weakness and numbness.     Physical Exam Updated Vital Signs BP 138/69 (BP Location: Left Arm)   Pulse 94   Temp 97.7 F (36.5 C) (Oral)   Resp 20   Ht 5' (1.524 m)   Wt 30.4 kg   SpO2 95%   BMI 13.09  kg/m   Physical Exam  Constitutional: She appears well-developed and well-nourished.  HENT:  Head: Normocephalic and atraumatic.  Right Ear: External ear normal.  Left Ear: External ear normal.  Eyes: Conjunctivae are normal. Right eye exhibits no discharge. Left eye exhibits no discharge. No scleral icterus.  Cardiovascular:  Pulses:      Radial pulses are 2+ on the right side.  Pulmonary/Chest: Effort normal. No respiratory distress.  Musculoskeletal:       Right wrist: Normal.       Right hand: She exhibits tenderness. She exhibits normal range of motion, normal capillary refill, no deformity, no laceration and no swelling. Normal sensation noted. Normal  strength noted.       Hands: No snuffbox ttp.   Neurological: She is alert.  Skin: No pallor.  Psychiatric: She has a normal mood and affect.  Nursing note and vitals reviewed.    ED Treatments / Results  Labs (all labs ordered are listed, but only abnormal results are displayed) Labs Reviewed - No data to display  EKG None  Radiology Dg Hand Complete Right  Result Date: 01/06/2018 CLINICAL DATA:  Acute right thumb pain without known injury. EXAM: RIGHT HAND - COMPLETE 3+ VIEW COMPARISON:  None. FINDINGS: There is no evidence of fracture or dislocation. There is no evidence of arthropathy or other focal bone abnormality. Soft tissues are unremarkable. IMPRESSION: Normal right hand. Electronically Signed   By: Lupita Raider, M.D.   On: 01/06/2018 11:52    Procedures Procedures (including critical care time)  Medications Ordered in ED Medications - No data to display   Initial Impression / Assessment and Plan / ED Course  I have reviewed the triage vital signs and the nursing notes.  Pertinent labs & imaging results that were available during my care of the patient were reviewed by me and considered in my medical decision making (see chart for details).     57 y.o. female with right hand pain. She is NVI.  Compartments are soft.  No bony tenderness.  X-rays are negative.  No evidence of septic joint. Patient X-Ray reviewed by myself & negative for obvious fracture or dislocation. Pain declined pain medication in ED. Pt advised to follow up with PCP if symptoms persist for possibility of missed fracture diagnosis. Patient given brace while in ED, conservative therapy recommended and discussed. Return precautions discussed. Patient will be dc home & is agreeable with above plan.  Final Clinical Impressions(s) / ED Diagnoses   Final diagnoses:  Right hand pain    ED Discharge Orders    None       Princella Pellegrini 01/06/18 1208    Sabas Sous,  MD 01/06/18 1700

## 2018-01-06 NOTE — Progress Notes (Signed)
Orthopedic Tech Progress Note Patient Details:  Ashley JubaKathy Hodge [birth year] [id number]  Ortho Devices Type of Ortho Device: Thumb velcro splint Ortho Device/Splint Interventions: Application   Post Interventions Patient Tolerated: Well Instructions Provided: Care of device   Saul FordyceJennifer C Stalin Gruenberg 01/06/2018, 12:22 PM

## 2018-01-08 ENCOUNTER — Emergency Department (HOSPITAL_COMMUNITY)
Admission: EM | Admit: 2018-01-08 | Discharge: 2018-01-08 | Disposition: A | Discharge status: Home / Self Care | Payer: Medicare Other | Attending: Emergency Medicine | Admitting: Emergency Medicine

## 2018-01-08 DIAGNOSIS — I10 Essential (primary) hypertension: Secondary | ICD-10-CM | POA: Insufficient documentation

## 2018-01-08 DIAGNOSIS — J449 Chronic obstructive pulmonary disease, unspecified: Secondary | ICD-10-CM | POA: Insufficient documentation

## 2018-01-08 DIAGNOSIS — Z79899 Other long term (current) drug therapy: Secondary | ICD-10-CM | POA: Diagnosis not present

## 2018-01-08 DIAGNOSIS — F17210 Nicotine dependence, cigarettes, uncomplicated: Secondary | ICD-10-CM | POA: Insufficient documentation

## 2018-01-08 DIAGNOSIS — R5383 Other fatigue: Secondary | ICD-10-CM

## 2018-01-08 LAB — CBC WITH DIFFERENTIAL/PLATELET
ABS IMMATURE GRANULOCYTES: 0 10*3/uL (ref 0.0–0.1)
Basophils Absolute: 0.1 10*3/uL (ref 0.0–0.1)
Basophils Relative: 1 %
Eosinophils Absolute: 0.4 10*3/uL (ref 0.0–0.7)
Eosinophils Relative: 4 %
HEMATOCRIT: 40.9 % (ref 36.0–46.0)
HEMOGLOBIN: 13.3 g/dL (ref 12.0–15.0)
Immature Granulocytes: 0 %
LYMPHS ABS: 2.1 10*3/uL (ref 0.7–4.0)
LYMPHS PCT: 20 %
MCH: 31.1 pg (ref 26.0–34.0)
MCHC: 32.5 g/dL (ref 30.0–36.0)
MCV: 95.8 fL (ref 78.0–100.0)
MONO ABS: 0.8 10*3/uL (ref 0.1–1.0)
MONOS PCT: 8 %
NEUTROS ABS: 6.8 10*3/uL (ref 1.7–7.7)
Neutrophils Relative %: 67 %
Platelets: 314 10*3/uL (ref 150–400)
RBC: 4.27 MIL/uL (ref 3.87–5.11)
RDW: 11.8 % (ref 11.5–15.5)
WBC: 10.1 10*3/uL (ref 4.0–10.5)

## 2018-01-08 LAB — BASIC METABOLIC PANEL
Anion gap: 14 (ref 5–15)
BUN: 10 mg/dL (ref 6–20)
CO2: 26 mmol/L (ref 22–32)
Calcium: 9.3 mg/dL (ref 8.9–10.3)
Chloride: 104 mmol/L (ref 98–111)
Creatinine, Ser: 0.78 mg/dL (ref 0.44–1.00)
GFR calc Af Amer: >60 mL/min (ref >60)
GFR calc non Af Amer: >60 mL/min (ref >60)
GLUCOSE: 114 mg/dL — AB (ref 70–99)
POTASSIUM: 3.6 mmol/L (ref 3.5–5.1)
Sodium: 144 mmol/L (ref 135–145)

## 2018-01-08 MED ORDER — ONDANSETRON 4 MG PO TBDP
8.0000 mg | ORAL_TABLET | Freq: Once | ORAL | Status: AC
  Administered 2018-01-08: 8 mg via ORAL
  Filled 2018-01-08: qty 2

## 2018-01-08 MED ORDER — ONDANSETRON 8 MG PO TBDP: 8.0000 mg | ORAL_TABLET | Freq: Three times a day (TID) | ORAL | 0 refills | Status: DC | PRN

## 2018-01-08 NOTE — ED Triage Notes (Signed)
Patient to ED c/o generalized weakness/fatigue and nausea after having flu shot yesterday. She denies CP/SOB, dizziness, extremity weakness, or numbness/tingling. Patient ambulated from wheelchair to stretcher chair without difficulty.

## 2018-01-08 NOTE — ED Provider Notes (Signed)
Patient placed in Quick Look pathway, seen and evaluated   Chief Complaint: Fatigue  HPI:   57 y.o. female who states she has felt tired and nauseous that began an hour ago.  Patient reports she got a flu shot yesterday.  She states that since then, she has felt rundown but today it got worse.  No chest pain, difficulty breathing, vision changes, numbness/weakness of arms or legs.  ROS: Fatigue  Physical Exam:   Gen: No distress  Neuro: Awake and Alert  Skin: Warm    Focused Exam: CN II-XII intact. 5/5 strength of BUE and BLE, RRR. Lungs CTAB   Initiation of care has begun. The patient has been counseled on the process, plan, and necessity for staying for the completion/evaluation, and the remainder of the medical screening examination    Maxwell CaulLayden, Lindsey A, PA-C 01/08/18 1428    Tegeler, Canary Brimhristopher J, MD 01/08/18 [phone]

## 2018-01-08 NOTE — ED Provider Notes (Signed)
[facility] EMERGENCY DEPARTMENT Provider Note   CSN: [id number] Arrival date & time: 01/08/18  1402     History   Chief Complaint Chief Complaint  Patient presents with  . Fatigue    HPI Ashley Hodge is a 57 y.o. female.  HPI Patient presents to the emergency room for evaluation of fatigue.  Patient states she had a flu shot yesterday.  Since then she has felt fatigued.  Patient states she has been nauseated and does not feel like she has any energy.  She denies any trouble with fevers or chills.  No chest pain or shortness of breath.  No abdominal pain.  No vomiting or diarrhea.  No numbness or weakness.  No urinary frequency or dysuria. Past Medical History:  Diagnosis Date  . Asthma   . COPD (chronic obstructive pulmonary disease) (HCC)   . High cholesterol   . Hypertension     There are no active problems to display for this patient.   Past Surgical History:  Procedure Laterality Date  . TUBAL LIGATION       OB History   None      Home Medications    Prior to Admission medications   Medication Sig Start Date End Date Taking? Authorizing Provider  amLODipine (NORVASC) 5 MG tablet Take 5 mg by mouth daily. 09/07/17   [provider]  citalopram (CELEXA) 20 MG tablet Take 20 mg by mouth daily. 12/25/17   [provider]  hydrochlorothiazide (HYDRODIURIL) 12.5 MG tablet Take 12.5 mg by mouth daily. 09/07/17   [provider]  ibuprofen (ADVIL,MOTRIN) 600 MG tablet Take 1 tablet (600 mg total) by mouth every 8 (eight) hours as needed. Patient taking differently: Take 600 mg by mouth every 8 (eight) hours as needed for fever, headache or mild pain.  12/27/17   Pricilla Loveless, MD  losartan (COZAAR) 50 MG tablet Take 50 mg by mouth daily. 09/07/17   [provider]  ondansetron (ZOFRAN ODT) 8 MG disintegrating tablet Take 1 tablet (8 mg total) by mouth every 8 (eight) hours as needed for nausea or vomiting. 01/08/18    Linwood Dibbles, MD  pravastatin (PRAVACHOL) 80 MG tablet Take 80 mg by mouth daily. 09/07/17   [provider]    Family History Family History  Problem Relation Age of Onset  . Heart disease Mother   . Hypertension Mother     Social History Social History   Tobacco Use  . Smoking status: Current Every Day Smoker    Packs/day: 1.00    Types: Cigarettes  . Smokeless tobacco: Never Used  Substance Use Topics  . Alcohol use: Not Currently    Frequency: Never    Comment: "clean x 4 yrs"  . Drug use: Never     Allergies   Lisinopril   Review of Systems Review of Systems  All other systems reviewed and are negative.    Physical Exam Updated Vital Signs BP 127/74 (BP Location: Left Arm)   Pulse 95   Temp 98.7 F (37.1 C) (Oral)   Resp 18   SpO2 98%   Physical Exam  Constitutional: No distress.  HENT:  Head: Normocephalic and atraumatic.  Right Ear: External ear normal.  Left Ear: External ear normal.  Eyes: Conjunctivae are normal. Right eye exhibits no discharge. Left eye exhibits no discharge. No scleral icterus.  Neck: Neck supple. No tracheal deviation present.  Cardiovascular: Normal rate, regular rhythm and intact distal pulses.  Pulmonary/Chest: Effort  normal and breath sounds normal. No stridor. No respiratory distress. She has no wheezes. She has no rales.  Abdominal: Soft. Bowel sounds are normal. She exhibits no distension. There is no tenderness. There is no rebound and no guarding.  Musculoskeletal: She exhibits no edema or tenderness.  Neurological: She is alert. She has normal strength. No cranial nerve deficit (no facial droop, extraocular movements intact, no slurred speech) or sensory deficit. She exhibits normal muscle tone. She displays no seizure activity. Coordination normal.  Skin: Skin is warm and dry. No rash noted. She is not diaphoretic.  Psychiatric: She has a normal mood and affect.  Nursing note and vitals reviewed.    ED  Treatments / Results  Labs (all labs ordered are listed, but only abnormal results are displayed) Labs Reviewed  BASIC METABOLIC PANEL - Abnormal; Notable for the following components:      Result Value   Glucose, Bld 114 (*)    All other components within normal limits  CBC WITH DIFFERENTIAL/PLATELET    EKG EKG Interpretation  Date/Time:  Monday January 08 2018 14:27:44 EDT Ventricular Rate:  89 PR Interval:  130 QRS Duration: 90 QT Interval:  368 QTC Calculation: 447 R Axis:   66 Text Interpretation:  Normal sinus rhythm Low voltage QRS Cannot rule out Anterior infarct , age undetermined Abnormal ECG Since last tracing rate slower Confirmed by Linwood DibblesKnapp, Nandi Tonnesen [phone]([id number]) on 01/08/2018 3:28:31 PM    Procedures Procedures (including critical care time)  Medications Ordered in ED Medications  ondansetron (ZOFRAN-ODT) disintegrating tablet 8 mg (has no administration in time range)     Initial Impression / Assessment and Plan / ED Course  I have reviewed the triage vital signs and the nursing notes.  Pertinent labs & imaging results that were available during my care of the patient were reviewed by me and considered in my medical decision making (see chart for details).   Patient presents to the emergency room with complaints of fatigue after flu shot yesterday.  She has no other specific complaints such as chest pain or shortness of breath.  No fevers or chills.  No areas of pain.  Her laboratory tests are normal.  Patient's vital signs and physical exam are reassuring.  She could be doing a mild reaction to the flu shot.  At this time there does not appear to be any evidence of an acute emergency medical condition and the patient appears stable for discharge with appropriate outpatient follow up.  Final Clinical Impressions(s) / ED Diagnoses   Final diagnoses:  Fatigue, unspecified type    ED Discharge Orders         Ordered    ondansetron (ZOFRAN ODT) 8 MG disintegrating tablet   Every 8 hours PRN     01/08/18 1546           Linwood DibblesKnapp, Joanthony Hamza, MD 01/08/18 1546

## 2018-01-08 NOTE — Discharge Instructions (Addendum)
Follow-up with your primary care doctor as needed, return for fever , vomiting, or other concerning symptoms

## 2018-01-08 NOTE — ED Triage Notes (Signed)
Pt reports getting a Flu shot on Sunday and thinks that is why she feels fatigued.

## 2018-01-09 ENCOUNTER — Emergency Department (HOSPITAL_COMMUNITY)
Admission: EM | Admit: 2018-01-09 | Discharge: 2018-01-10 | Disposition: A | Discharge status: Home / Self Care | Payer: Medicare Other | Attending: Emergency Medicine | Admitting: Emergency Medicine

## 2018-01-09 ENCOUNTER — Encounter (HOSPITAL_COMMUNITY): Payer: Self-pay | Admitting: Emergency Medicine

## 2018-01-09 ENCOUNTER — Other Ambulatory Visit: Payer: Self-pay

## 2018-01-09 DIAGNOSIS — N3000 Acute cystitis without hematuria: Secondary | ICD-10-CM

## 2018-01-09 DIAGNOSIS — F17210 Nicotine dependence, cigarettes, uncomplicated: Secondary | ICD-10-CM | POA: Diagnosis not present

## 2018-01-09 DIAGNOSIS — I10 Essential (primary) hypertension: Secondary | ICD-10-CM | POA: Diagnosis not present

## 2018-01-09 DIAGNOSIS — Z79899 Other long term (current) drug therapy: Secondary | ICD-10-CM | POA: Diagnosis not present

## 2018-01-09 DIAGNOSIS — Z5900 Homelessness unspecified: Secondary | ICD-10-CM

## 2018-01-09 DIAGNOSIS — R350 Frequency of micturition: Secondary | ICD-10-CM | POA: Diagnosis present

## 2018-01-09 DIAGNOSIS — J45909 Unspecified asthma, uncomplicated: Secondary | ICD-10-CM | POA: Diagnosis not present

## 2018-01-09 LAB — URINALYSIS, ROUTINE W REFLEX MICROSCOPIC
BILIRUBIN URINE: NEGATIVE
Glucose, UA: NEGATIVE mg/dL
KETONES UR: NEGATIVE mg/dL
Nitrite: NEGATIVE
Protein, ur: NEGATIVE mg/dL
Specific Gravity, Urine: 1.002 — ABNORMAL LOW (ref 1.005–1.030)
pH: 7 (ref 5.0–8.0)

## 2018-01-09 NOTE — ED Notes (Signed)
Pt driving circles around lobby in electric wheel chair

## 2018-01-09 NOTE — ED Notes (Signed)
Pt at nurses station asking when they will be coming to get her.  Advised pt that they are aware she is here and they will get her into a room just as soon as they can but there are several people also waiting to be seen.

## 2018-01-09 NOTE — ED Triage Notes (Signed)
Pt states that she has had increased urinary frequency, and states that she "wants to find out why she keeps running to the hospital all the time" and thinks she may need mental health resources. Denies SI/HI.

## 2018-01-10 ENCOUNTER — Emergency Department (HOSPITAL_COMMUNITY)
Admission: EM | Admit: 2018-01-10 | Discharge: 2018-01-11 | Disposition: A | Discharge status: Home / Self Care | Payer: Medicare Other | Source: Home / Self Care | Attending: Emergency Medicine | Admitting: Emergency Medicine

## 2018-01-10 ENCOUNTER — Other Ambulatory Visit: Payer: Self-pay

## 2018-01-10 ENCOUNTER — Encounter (HOSPITAL_COMMUNITY): Payer: Self-pay | Admitting: Obstetrics and Gynecology

## 2018-01-10 DIAGNOSIS — N3000 Acute cystitis without hematuria: Secondary | ICD-10-CM | POA: Diagnosis not present

## 2018-01-10 DIAGNOSIS — R1013 Epigastric pain: Secondary | ICD-10-CM

## 2018-01-10 LAB — COMPREHENSIVE METABOLIC PANEL
ALBUMIN: 4.1 g/dL (ref 3.5–5.0)
ALT: 22 U/L (ref 0–44)
ANION GAP: 9 (ref 5–15)
AST: 20 U/L (ref 15–41)
Alkaline Phosphatase: 98 U/L (ref 38–126)
BILIRUBIN TOTAL: 0.2 mg/dL — AB (ref 0.3–1.2)
BUN: 13 mg/dL (ref 6–20)
CHLORIDE: 105 mmol/L (ref 98–111)
CO2: 28 mmol/L (ref 22–32)
Calcium: 9 mg/dL (ref 8.9–10.3)
Creatinine, Ser: 0.87 mg/dL (ref 0.44–1.00)
GFR calc Af Amer: >60 mL/min (ref >60)
Glucose, Bld: 106 mg/dL — ABNORMAL HIGH (ref 70–99)
Potassium: 3.5 mmol/L (ref 3.5–5.1)
Sodium: 142 mmol/L (ref 135–145)
TOTAL PROTEIN: 7.6 g/dL (ref 6.5–8.1)

## 2018-01-10 LAB — CBC
HCT: 39.7 % (ref 36.0–46.0)
HEMOGLOBIN: 13.1 g/dL (ref 12.0–15.0)
MCH: 31.2 pg (ref 26.0–34.0)
MCHC: 33 g/dL (ref 30.0–36.0)
MCV: 94.5 fL (ref 78.0–100.0)
Platelets: 350 10*3/uL (ref 150–400)
RBC: 4.2 MIL/uL (ref 3.87–5.11)
RDW: 12.4 % (ref 11.5–15.5)
WBC: 10.4 10*3/uL (ref 4.0–10.5)

## 2018-01-10 LAB — LIPASE, BLOOD: LIPASE: 36 U/L (ref 11–51)

## 2018-01-10 MED ORDER — CIPROFLOXACIN HCL 500 MG PO TABS
500.0000 mg | ORAL_TABLET | Freq: Once | ORAL | Status: AC
  Administered 2018-01-10: 500 mg via ORAL
  Filled 2018-01-10: qty 1

## 2018-01-10 MED ORDER — CIPROFLOXACIN HCL 500 MG PO TABS: 500.0000 mg | ORAL_TABLET | Freq: Two times a day (BID) | ORAL | 0 refills | Status: DC

## 2018-01-10 NOTE — Discharge Instructions (Addendum)
Cipro as prescribed.  Aloe up with a primary doctor or counselor to discuss your issues.

## 2018-01-10 NOTE — ED Provider Notes (Signed)
[facility] EMERGENCY DEPARTMENT Provider Note   CSN: [id number] Arrival date & time: 01/09/18  2150     History   Chief Complaint Chief Complaint  Patient presents with  . Urinary Frequency  . Psychiatric Evaluation    HPI Ashley Hodge is a 57 y.o. female.  Patient is a 57 year old female with past medical history of homelessness, asthma, COPD, hypertension, and prior UTI.  She presents today for evaluation of possible UTI.  She states she was recently treated with Keflex for a urinary tract infection and wants to know if this is cleared up.  She denies to me that she is experiencing any specific abdominal pain, fever, or flank pain.  She also wants to know "why she is here so much".  She states that she goes to the emergency room with great regularity and would like to know why.  The history is provided by the patient.  Urinary Frequency  This is a recurrent problem. The current episode started yesterday. The problem occurs constantly. The problem has not changed since onset.Nothing aggravates the symptoms. Nothing relieves the symptoms.    Past Medical History:  Diagnosis Date  . Asthma   . COPD (chronic obstructive pulmonary disease) (HCC)   . High cholesterol   . Hypertension     There are no active problems to display for this patient.   Past Surgical History:  Procedure Laterality Date  . TUBAL LIGATION       OB History   None      Home Medications    Prior to Admission medications   Medication Sig Start Date End Date Taking? Authorizing Provider  amLODipine (NORVASC) 5 MG tablet Take 5 mg by mouth daily. 09/07/17   [provider]  citalopram (CELEXA) 20 MG tablet Take 20 mg by mouth daily. 12/25/17   [provider]  hydrochlorothiazide (HYDRODIURIL) 12.5 MG tablet Take 12.5 mg by mouth daily. 09/07/17   [provider]  ibuprofen (ADVIL,MOTRIN) 600 MG tablet Take 1 tablet (600 mg total) by mouth every 8  (eight) hours as needed. Patient taking differently: Take 600 mg by mouth every 8 (eight) hours as needed for fever, headache or mild pain.  12/27/17   Pricilla Loveless, MD  losartan (COZAAR) 50 MG tablet Take 50 mg by mouth daily. 09/07/17   [provider]  ondansetron (ZOFRAN ODT) 8 MG disintegrating tablet Take 1 tablet (8 mg total) by mouth every 8 (eight) hours as needed for nausea or vomiting. 01/08/18   Linwood Dibbles, MD  pravastatin (PRAVACHOL) 80 MG tablet Take 80 mg by mouth daily. 09/07/17   [provider]    Family History Family History  Problem Relation Age of Onset  . Heart disease Mother   . Hypertension Mother     Social History Social History   Tobacco Use  . Smoking status: Current Every Day Smoker    Packs/day: 1.00    Types: Cigarettes  . Smokeless tobacco: Never Used  Substance Use Topics  . Alcohol use: Not Currently    Frequency: Never    Comment: "clean x 4 yrs"  . Drug use: Never     Allergies   Lisinopril   Review of Systems Review of Systems  Genitourinary: Positive for frequency.  All other systems reviewed and are negative.    Physical Exam Updated Vital Signs BP 112/64   Pulse 69   Temp 98.6 F (37 C) (Oral)   Resp 18   Ht 5' (  1.524 m)   Wt 30.4 kg   SpO2 93%   BMI 13.09 kg/m   Physical Exam  Constitutional: She is oriented to person, place, and time. She appears well-developed and well-nourished. No distress.  HENT:  Head: Normocephalic and atraumatic.  Neck: Normal range of motion. Neck supple.  Cardiovascular: Normal rate and regular rhythm. Exam reveals no gallop and no friction rub.  No murmur heard. Pulmonary/Chest: Effort normal and breath sounds normal. No respiratory distress. She has no wheezes.  Abdominal: Soft. Bowel sounds are normal. She exhibits no distension. There is no tenderness.  Musculoskeletal: Normal range of motion.  Neurological: She is alert and oriented to person, place, and time.    Skin: Skin is warm and dry. She is not diaphoretic.  Nursing note and vitals reviewed.    ED Treatments / Results  Labs (all labs ordered are listed, but only abnormal results are displayed) Labs Reviewed  URINALYSIS, ROUTINE W REFLEX MICROSCOPIC - Abnormal; Notable for the following components:      Result Value   Color, Urine STRAW (*)    APPearance HAZY (*)    Specific Gravity, Urine 1.002 (*)    Hgb urine dipstick SMALL (*)    Leukocytes, UA LARGE (*)    Bacteria, UA RARE (*)    All other components within normal limits    EKG None  Radiology No results found.  Procedures Procedures (including critical care time)  Medications Ordered in ED Medications  ciprofloxacin (CIPRO) tablet 500 mg (has no administration in time range)     Initial Impression / Assessment and Plan / ED Course  I have reviewed the triage vital signs and the nursing notes.  Pertinent labs & imaging results that were available during my care of the patient were reviewed by me and considered in my medical decision making (see chart for details).  Patient's urine is consistent with a urinary tract infection.  She will be treated with Cipro.  Her abdominal exam is benign and I see no indication for any further medical work-up.  She does report to me that she is in the ER with great regularity and is concerned she may be a "hypochondriac".  She tells me she was told this by the EMS worker this evening.  She denies any suicidal or homicidal ideation and does not appear to be acutely psychotic.  I see no indication for inpatient psychiatric care and believe she is appropriate for discharge with outpatient resources.  Final Clinical Impressions(s) / ED Diagnoses   Final diagnoses:  None    ED Discharge Orders    None       Geoffery Lyonselo, Mckayla Mulcahey, MD 01/10/18 [phone]

## 2018-01-10 NOTE — ED Triage Notes (Signed)
Pt reports she has been having emesis every time she tried to eat. Pt reports she is also having abdominal pain on the right side. Pt state she is homeless and the pain may be from sleeping on concrete.

## 2018-01-11 ENCOUNTER — Other Ambulatory Visit: Payer: Self-pay

## 2018-01-11 ENCOUNTER — Ambulatory Visit (HOSPITAL_COMMUNITY)
Admission: EM | Admit: 2018-01-11 | Discharge: 2018-01-11 | Disposition: A | Discharge status: Home / Self Care | Payer: Medicare Other | Attending: Family Medicine | Admitting: Family Medicine

## 2018-01-11 DIAGNOSIS — R0789 Other chest pain: Secondary | ICD-10-CM

## 2018-01-11 MED ORDER — GI COCKTAIL ~~LOC~~
30.0000 mL | Freq: Once | ORAL | Status: DC
  Filled 2018-01-11: qty 30

## 2018-01-11 MED ORDER — FAMOTIDINE 20 MG PO TABS
ORAL_TABLET | ORAL | Status: AC
  Filled 2018-01-11: qty 1

## 2018-01-11 MED ORDER — OMEPRAZOLE 20 MG PO CPDR: 20.0000 mg | DELAYED_RELEASE_CAPSULE | Freq: Every day | ORAL | 0 refills | Status: DC

## 2018-01-11 MED ORDER — FAMOTIDINE 20 MG PO TABS
20.0000 mg | ORAL_TABLET | Freq: Once | ORAL | Status: AC
  Administered 2018-01-11: 20 mg via ORAL

## 2018-01-11 MED FILL — OMEPRAZOLE 20 MG CAP: 20 | 14 days supply | Qty: 14 | Fill #0

## 2018-01-11 NOTE — ED Provider Notes (Signed)
[facility]-EMERGENCY DEPT Provider Note   CSN: [id number] Arrival date & time: 01/10/18  2057     History   Chief Complaint Chief Complaint  Patient presents with  . Abdominal Pain  . Nausea  . Emesis    HPI Ashley Hodge is a 57 y.o. female.  Patient returns to the emergency department with complaint of epigastric abdominal pain that started today. No nausea, vomiting. She denies previous history of epigastric pain. No radiation of the symptoms. No aggravating or alleviating factors. She denies alcohol use. No fever. She has not taken anything for the pain. Seen yesterday for recheck of UTI without complaint of abdominal pain.   The history is provided by the patient. No language interpreter was used.    Past Medical History:  Diagnosis Date  . Asthma   . COPD (chronic obstructive pulmonary disease) (HCC)   . High cholesterol   . Hypertension     There are no active problems to display for this patient.   Past Surgical History:  Procedure Laterality Date  . TUBAL LIGATION       OB History    Gravida      Para      Term      Preterm      AB      Living  5     SAB      TAB      Ectopic      Multiple      Live Births               Home Medications    Prior to Admission medications   Medication Sig Start Date End Date Taking? Authorizing Provider  amLODipine (NORVASC) 5 MG tablet Take 5 mg by mouth daily. 09/07/17  Yes [provider]  hydrochlorothiazide (HYDRODIURIL) 12.5 MG tablet Take 12.5 mg by mouth daily. 09/07/17  Yes [provider]  losartan (COZAAR) 50 MG tablet Take 50 mg by mouth daily. 09/07/17  Yes [provider]  pravastatin (PRAVACHOL) 80 MG tablet Take 80 mg by mouth daily. 09/07/17  Yes [provider]  ciprofloxacin (CIPRO) 500 MG tablet Take 1 tablet (500 mg total) by mouth 2 (two) times daily. One po bid x 7 days 01/10/18   Geoffery Lyonselo, Douglas, MD  ibuprofen (ADVIL,MOTRIN)  600 MG tablet Take 1 tablet (600 mg total) by mouth every 8 (eight) hours as needed. Patient not taking: Reported on 01/10/2018 12/27/17   Pricilla LovelessGoldston, Scott, MD  ondansetron (ZOFRAN ODT) 8 MG disintegrating tablet Take 1 tablet (8 mg total) by mouth every 8 (eight) hours as needed for nausea or vomiting. 01/08/18   Linwood DibblesKnapp, Jon, MD    Family History Family History  Problem Relation Age of Onset  . Heart disease Mother   . Hypertension Mother     Social History Social History   Tobacco Use  . Smoking status: Current Every Day Smoker    Packs/day: 1.00    Types: Cigarettes  . Smokeless tobacco: Never Used  Substance Use Topics  . Alcohol use: Not Currently    Frequency: Never    Comment: "clean x 4 yrs"  . Drug use: Never     Allergies   Lisinopril   Review of Systems Review of Systems  Constitutional: Negative for chills and fever.  HENT: Negative.   Respiratory: Negative.  Negative for shortness of breath.   Cardiovascular: Negative.   Gastrointestinal: Positive for abdominal pain. Negative for nausea and vomiting.  Musculoskeletal: Negative.   Skin: Negative.   Neurological: Negative.      Physical Exam Updated Vital Signs BP 119/75 (BP Location: Left Arm)   Pulse 77   Temp 98.2 F (36.8 C) (Oral)   Resp 18   Ht 5' (1.524 m)   Wt 69.9 kg   SpO2 96%   BMI 30.08 kg/m   Physical Exam  Constitutional: She appears well-developed and well-nourished.  Non-toxic appearance. No distress.  HENT:  Head: Normocephalic.  Neck: Normal range of motion. Neck supple.  Cardiovascular: Normal rate and regular rhythm.  Pulmonary/Chest: Effort normal and breath sounds normal. She has no wheezes. She has no rhonchi. She has no rales.  Abdominal: Soft. Bowel sounds are normal. There is no tenderness. There is no rebound and no guarding.  Musculoskeletal: Normal range of motion.  Neurological: She is alert. No cranial nerve deficit.  Skin: Skin is warm and dry. No rash noted.    Psychiatric: She has a normal mood and affect.     ED Treatments / Results  Labs (all labs ordered are listed, but only abnormal results are displayed) Labs Reviewed  COMPREHENSIVE METABOLIC PANEL - Abnormal; Notable for the following components:      Result Value   Glucose, Bld 106 (*)    Total Bilirubin 0.2 (*)    All other components within normal limits  LIPASE, BLOOD  CBC  URINALYSIS, ROUTINE W REFLEX MICROSCOPIC    EKG None  Radiology No results found.  Procedures Procedures (including critical care time)  Medications Ordered in ED Medications - No data to display   Initial Impression / Assessment and Plan / ED Course  I have reviewed the triage vital signs and the nursing notes.  Pertinent labs & imaging results that were available during my care of the patient were reviewed by me and considered in my medical decision making (see chart for details).     Patient with history of homelessness, COPD, HTN presents with c/o abdominal pain limited to epigastrium for less than one day. No vomiting.   She has a benign exam now, without tenderness. She is sleeping on initial evaluation. VSS. No tachycardia or fever.   GI cocktail provided. Labs are completely normal, without change when compared to 8/26 when previously evaluated in the ED. She is felt stable for discharge home without evidence or concern for acute process.  Final Clinical Impressions(s) / ED Diagnoses   Final diagnoses:  None   1. Abdominal pain 2. Homelessness  ED Discharge Orders    None       Elpidio Anis, PA-C 01/11/18 1610    Gilda Crease, MD 01/11/18 [phone]

## 2018-01-11 NOTE — Discharge Instructions (Signed)
We gave you pepcid today Please begin omeprazole daily for 2 weeks  Please follow-up if symptoms not improving, developing worsening chest pain, noticing chest discomfort worsening with exertion, shortness of breath, chest discomfort with breathing, leg swelling or leg pain, dizziness or lightheadedness.

## 2018-01-11 NOTE — Discharge Instructions (Addendum)
Recommend Zantac twice daily to help with epigastric pain. Follow up with your doctor as needed.

## 2018-01-11 NOTE — ED Triage Notes (Signed)
Pt states she has  left breast pain ( sharp ). X  3 days and has nausea.

## 2018-01-11 NOTE — ED Provider Notes (Signed)
[facility]    CSN: [id number] Arrival date & time: 01/11/18  1014     History   Chief Complaint Chief Complaint  Patient presents with  . left breast pain    HPI Ashley Hodge is a 57 y.o. female history of asthma, COPD, hypertension, hyperlipidemia and tobacco use presenting today for evaluation of chest burning.  Patient states that for the past 3 days she has had a burning sensation in her chest especially after eating with associated nausea.  Typically is worse on the left side, but will radiate to the right occasionally.  Denies worsening with exertion, will happen when she is sitting still.  Denies shortness of breath.  Does note a cough.  Has of approximately 30-pack-year history.  Denies abdominal pain, diarrhea or changes in bowel movements.  She denies pain at time of visit.  Denies chest discomfort worsening with breathing, or coughing.  Patient is currently homeless, plans to get housing on Tuesday. HPI  Past Medical History:  Diagnosis Date  . Asthma   . COPD (chronic obstructive pulmonary disease) (HCC)   . High cholesterol   . Hypertension     There are no active problems to display for this patient.   Past Surgical History:  Procedure Laterality Date  . TUBAL LIGATION      OB History    Gravida      Para      Term      Preterm      AB      Living  5     SAB      TAB      Ectopic      Multiple      Live Births               Home Medications    Prior to Admission medications   Medication Sig Start Date End Date Taking? Authorizing Provider  amLODipine (NORVASC) 5 MG tablet Take 5 mg by mouth daily. 09/07/17   [provider]  ciprofloxacin (CIPRO) 500 MG tablet Take 1 tablet (500 mg total) by mouth 2 (two) times daily. One po bid x 7 days 01/10/18   Geoffery Lyons, MD  hydrochlorothiazide (HYDRODIURIL) 12.5 MG tablet Take 12.5 mg by mouth daily. 09/07/17   [provider]  losartan (COZAAR) 50 MG tablet  Take 50 mg by mouth daily. 09/07/17   [provider]  omeprazole (PRILOSEC) 20 MG capsule Take 1 capsule (20 mg total) by mouth daily for 14 days. 01/11/18 01/25/18  Evaleen Sant C, PA-C  ondansetron (ZOFRAN ODT) 8 MG disintegrating tablet Take 1 tablet (8 mg total) by mouth every 8 (eight) hours as needed for nausea or vomiting. 01/08/18   Linwood Dibbles, MD  pravastatin (PRAVACHOL) 80 MG tablet Take 80 mg by mouth daily. 09/07/17   [provider]    Family History Family History  Problem Relation Age of Onset  . Heart disease Mother   . Hypertension Mother     Social History Social History   Tobacco Use  . Smoking status: Current Every Day Smoker    Packs/day: 1.00    Types: Cigarettes  . Smokeless tobacco: Never Used  Substance Use Topics  . Alcohol use: Not Currently    Frequency: Never    Comment: "clean x 4 yrs"  . Drug use: Never     Allergies   Lisinopril   Review of Systems Review of Systems  Constitutional: Negative for fatigue and fever.  HENT: Negative for congestion, sinus pressure and sore throat.   Eyes: Negative for photophobia, pain and visual disturbance.  Respiratory: Positive for cough. Negative for shortness of breath.   Cardiovascular: Positive for chest pain.  Gastrointestinal: Negative for abdominal pain, nausea and vomiting.  Genitourinary: Negative for decreased urine volume and hematuria.  Musculoskeletal: Negative for myalgias, neck pain and neck stiffness.  Neurological: Negative for dizziness, syncope, facial asymmetry, speech difficulty, weakness, light-headedness, numbness and headaches.     Physical Exam Triage Vital Signs ED Triage Vitals  Enc Vitals Group     BP 01/11/18 1119 119/74     Pulse Rate 01/11/18 1119 86     Resp 01/11/18 1119 18     Temp 01/11/18 1119 97.7 F (36.5 C)     Temp Source 01/11/18 1119 Oral     SpO2 01/11/18 1119 98 %     Weight 01/11/18 1124 154 lb (69.9 kg)     Height --      Head  Circumference --      Peak Flow --      Pain Score 01/11/18 1100 5     Pain Loc --      Pain Edu? --      Excl. in GC? --    No data found.  Updated Vital Signs BP 119/74   Pulse 86   Temp 97.7 F (36.5 C) (Oral)   Resp 18   Wt 154 lb (69.9 kg)   SpO2 98%   BMI 30.08 kg/m   Visual Acuity Right Eye Distance:   Left Eye Distance:   Bilateral Distance:    Right Eye Near:   Left Eye Near:    Bilateral Near:     Physical Exam  Constitutional: She appears well-developed and well-nourished. No distress.  HENT:  Head: Normocephalic and atraumatic.  Mouth/Throat: Oropharynx is clear and moist.  Eyes: Pupils are equal, round, and reactive to light. Conjunctivae and EOM are normal.  Neck: Neck supple.  Cardiovascular: Normal rate and regular rhythm.  No murmur heard. Pulmonary/Chest: Effort normal and breath sounds normal. No respiratory distress.  Breathing comfortably at rest, CTABL, no wheezing, rales or other adventitious sounds auscultated; no pain with deep inspiration  Chest discomfort not reproducible with exam.  Abdominal: Soft. There is no tenderness.  Mild tenderness to palpation of right lower flank, negative McBurney's, negative Murphy's, mild epigastric tenderness, nontender to palpation of left upper and lower quadrant.  Negative rebound, negative Rovsing.  Abdomen soft, no guarding during exam.  Musculoskeletal: She exhibits no edema.  No calf tenderness or swelling, negative Homans bilaterally.  Neurological: She is alert.  Skin: Skin is warm and dry.  Psychiatric: She has a normal mood and affect.  Nursing note and vitals reviewed.    UC Treatments / Results  Labs (all labs ordered are listed, but only abnormal results are displayed) Labs Reviewed - No data to display  EKG None  Radiology No results found.  Procedures Procedures (including critical care time)  Medications Ordered in UC Medications  famotidine (PEPCID) tablet 20 mg (20 mg  Oral Given 01/11/18 1226)    Initial Impression / Assessment and Plan / UC Course  I have reviewed the triage vital signs and the nursing notes.  Pertinent labs & imaging results that were available during my care of the patient were reviewed by me and considered in my medical decision making (see chart for details).     Patient with intermittent chest discomfort, worse after  eating.  EKG normal sinus rhythm, no acute signs of ischemia or infarction, does have a low voltage.  Low voltage present on previous EKGs as well.  Patient without discomfort at time of visit, will defer GI cocktail.  Provided Pepcid in clinic, will send home with Prilosec for 2 weeks for trial.  Return here or go to emergency room if developing worsening pain, shortness of breath, chest pain that is not resolving, chest pain related to exertion, lower leg swelling or pain.  PE seems less likely although patient has increased risk given smoking history.Discussed strict return precautions. Patient verbalized understanding and is agreeable with plan.  Final Clinical Impressions(s) / UC Diagnoses   Final diagnoses:  Chest discomfort     Discharge Instructions     We gave you pepcid today Please begin omeprazole daily for 2 weeks  Please follow-up if symptoms not improving, developing worsening chest pain, noticing chest discomfort worsening with exertion, shortness of breath, chest discomfort with breathing, leg swelling or leg pain, dizziness or lightheadedness.   ED Prescriptions    Medication Sig Dispense Auth. Provider   omeprazole (PRILOSEC) 20 MG capsule Take 1 capsule (20 mg total) by mouth daily for 14 days. 14 capsule Shayan Bramhall C, PA-C     Controlled Substance Prescriptions [place] Controlled Substance Registry consulted? Not Applicable   Lew DawesWieters, Rodney Wigger C, [place]PA-C 01/11/18 1317

## 2018-01-12 ENCOUNTER — Other Ambulatory Visit: Payer: Self-pay

## 2018-01-12 ENCOUNTER — Emergency Department (HOSPITAL_COMMUNITY)
Admission: EM | Admit: 2018-01-12 | Discharge: 2018-01-12 | Disposition: A | Discharge status: Home / Self Care | Payer: Medicare Other | Attending: Emergency Medicine | Admitting: Emergency Medicine

## 2018-01-12 ENCOUNTER — Emergency Department (HOSPITAL_COMMUNITY): Payer: Medicare Other

## 2018-01-12 DIAGNOSIS — F17210 Nicotine dependence, cigarettes, uncomplicated: Secondary | ICD-10-CM | POA: Insufficient documentation

## 2018-01-12 DIAGNOSIS — J449 Chronic obstructive pulmonary disease, unspecified: Secondary | ICD-10-CM | POA: Diagnosis not present

## 2018-01-12 DIAGNOSIS — R0789 Other chest pain: Secondary | ICD-10-CM | POA: Insufficient documentation

## 2018-01-12 DIAGNOSIS — I10 Essential (primary) hypertension: Secondary | ICD-10-CM | POA: Insufficient documentation

## 2018-01-12 DIAGNOSIS — R079 Chest pain, unspecified: Secondary | ICD-10-CM | POA: Diagnosis present

## 2018-01-12 DIAGNOSIS — Z7902 Long term (current) use of antithrombotics/antiplatelets: Secondary | ICD-10-CM | POA: Insufficient documentation

## 2018-01-12 DIAGNOSIS — Z79899 Other long term (current) drug therapy: Secondary | ICD-10-CM | POA: Insufficient documentation

## 2018-01-12 LAB — BASIC METABOLIC PANEL
Anion gap: 8 (ref 5–15)
BUN: 12 mg/dL (ref 6–20)
CO2: 28 mmol/L (ref 22–32)
CREATININE: 0.86 mg/dL (ref 0.44–1.00)
Calcium: 9.2 mg/dL (ref 8.9–10.3)
Chloride: 105 mmol/L (ref 98–111)
GFR calc Af Amer: >60 mL/min (ref >60)
GFR calc non Af Amer: >60 mL/min (ref >60)
Glucose, Bld: 103 mg/dL — ABNORMAL HIGH (ref 70–99)
POTASSIUM: 3.7 mmol/L (ref 3.5–5.1)
Sodium: 141 mmol/L (ref 135–145)

## 2018-01-12 LAB — CBC
HCT: 40.3 % (ref 36.0–46.0)
HEMOGLOBIN: 12.9 g/dL (ref 12.0–15.0)
MCH: 30.9 pg (ref 26.0–34.0)
MCHC: 32 g/dL (ref 30.0–36.0)
MCV: 96.4 fL (ref 78.0–100.0)
PLATELETS: 326 10*3/uL (ref 150–400)
RBC: 4.18 MIL/uL (ref 3.87–5.11)
RDW: 11.8 % (ref 11.5–15.5)
WBC: 11.3 10*3/uL — AB (ref 4.0–10.5)

## 2018-01-12 LAB — I-STAT TROPONIN, ED: Troponin i, poc: 0.01 ng/mL (ref 0.00–0.08)

## 2018-01-12 NOTE — ED Notes (Signed)
Denies chest pains at the moment.

## 2018-01-12 NOTE — ED Notes (Signed)
Patient wheeled herself outside to smoke a cigarette, again.

## 2018-01-12 NOTE — ED Triage Notes (Addendum)
Patient states that she has been sitting in the lobby with her friend, went outside and began to have CP. Was seen yesterday for same.

## 2018-01-12 NOTE — ED Notes (Signed)
Pt outside to smoke

## 2018-01-12 NOTE — ED Provider Notes (Signed)
[facility] Note  CSN: [id number] Arrival date & time: 01/12/18 0144  Chief Complaint(s) Chest Pain  HPI Ashley Hodge is a 57 y.o. female with a history of asthma, COPD, hypertension, hyperlipidemia and tobacco use who presents to the emergency department with brief sudden onset sternal sharp pain that lasted "a few seconds."  Patient was here with another friend and states that after going outside into the cold she felt the pain.  Pain quickly resolved spontaneously.  There was no associated shortness of breath or nausea.  Pain was nonradiating.  She denied any recent fevers or infections.  No abdominal pain.  HPI  Past Medical History Past Medical History:  Diagnosis Date  . Asthma   . COPD (chronic obstructive pulmonary disease) (HCC)   . High cholesterol   . Hypertension    There are no active problems to display for this patient.  Home Medication(s) Prior to Admission medications   Medication Sig Start Date End Date Taking? Authorizing Provider  amLODipine (NORVASC) 5 MG tablet Take 5 mg by mouth daily. 09/07/17   [provider]  ciprofloxacin (CIPRO) 500 MG tablet Take 1 tablet (500 mg total) by mouth 2 (two) times daily. One po bid x 7 days 01/10/18   Geoffery Lyonselo, Douglas, MD  hydrochlorothiazide (HYDRODIURIL) 12.5 MG tablet Take 12.5 mg by mouth daily. 09/07/17   [provider]  losartan (COZAAR) 50 MG tablet Take 50 mg by mouth daily. 09/07/17   [provider]  omeprazole (PRILOSEC) 20 MG capsule Take 1 capsule (20 mg total) by mouth daily for 14 days. 01/11/18 01/25/18  Wieters, Hallie C, PA-C  ondansetron (ZOFRAN ODT) 8 MG disintegrating tablet Take 1 tablet (8 mg total) by mouth every 8 (eight) hours as needed for nausea or vomiting. 01/08/18   Linwood DibblesKnapp, Jon, MD  pravastatin (PRAVACHOL) 80 MG tablet Take 80 mg by mouth daily. 09/07/17   [provider]                                                                                 Past Surgical History Past Surgical History:  Procedure Laterality Date  . TUBAL LIGATION     Family History Family History  Problem Relation Age of Onset  . Heart disease Mother   . Hypertension Mother     Social History Social History   Tobacco Use  . Smoking status: Current Every Day Smoker    Packs/day: 1.00    Types: Cigarettes  . Smokeless tobacco: Never Used  Substance Use Topics  . Alcohol use: Not Currently    Frequency: Never    Comment: "clean x 4 yrs"  . Drug use: Never   Allergies Lisinopril  Review of Systems Review of Systems All other systems are reviewed and are negative for acute change except as noted in the HPI  Physical Exam Vital Signs  I have reviewed the triage vital signs BP (!) 151/78 (BP Location: Left Arm)   Pulse 90   Temp 98.1 F (36.7 C) (Oral)   Resp 18   SpO2 97%   Physical Exam  Constitutional: She is oriented to person, place, and time. She appears  well-developed and well-nourished. No distress.  HENT:  Head: Normocephalic and atraumatic.  Nose: Nose normal.  Eyes: Pupils are equal, round, and reactive to light. Conjunctivae and EOM are normal. Right eye exhibits no discharge. Left eye exhibits no discharge. No scleral icterus.  Neck: Normal range of motion. Neck supple.  Cardiovascular: Normal rate and regular rhythm. Exam reveals no gallop and no friction rub.  No murmur heard. Pulmonary/Chest: Effort normal and breath sounds normal. No stridor. No respiratory distress. She has no rales.  Abdominal: Soft. She exhibits no distension. There is no tenderness.  Musculoskeletal: She exhibits no edema or tenderness.  Neurological: She is alert and oriented to person, place, and time.  Skin: Skin is warm and dry. No rash noted. She is not diaphoretic. No erythema.  Psychiatric: She has a normal mood and affect.  Vitals reviewed.   ED Results and  Treatments Labs (all labs ordered are listed, but only abnormal results are displayed) Labs Reviewed  BASIC METABOLIC PANEL - Abnormal; Notable for the following components:      Result Value   Glucose, Bld 103 (*)    All other components within normal limits  CBC - Abnormal; Notable for the following components:   WBC 11.3 (*)    All other components within normal limits  I-STAT TROPONIN, ED                                                                                                                         EKG  EKG Interpretation  Date/Time:  Friday January 12 2018 01:52:03 EDT Ventricular Rate:  82 PR Interval:  146 QRS Duration: 84 QT Interval:  368 QTC Calculation: 429 R Axis:   23 Text Interpretation:  Normal sinus rhythm Normal ECG No significant change since last tracing Confirmed by Drema Pry [phone]) on 01/12/2018 4:55:17 AM      Radiology Dg Chest 2 View  Result Date: 01/12/2018 CLINICAL DATA:  Centralized chest pain EXAM: CHEST - 2 VIEW COMPARISON:  01/02/2018 FINDINGS: Heart and mediastinal contours are within normal limits. No focal opacities or effusions. No acute bony abnormality. IMPRESSION: No active cardiopulmonary disease. Electronically Signed   By: Charlett Nose M.D.   On: 01/12/2018 02:24   Pertinent labs & imaging results that were available during my care of the patient were reviewed by me and considered in my medical decision making (see chart for details).  Medications Ordered in ED Medications - No data to display  Procedures Procedures  (including critical care time)  Medical Decision Making / ED Course I have reviewed the nursing notes for this encounter and the patient's prior records (if available in EHR or on provided paperwork).    Highly atypical chest pain.  EKG without acute ischemic changes or evidence of  pericarditis.  Initial troponin negative.  Doubt ACS.  No need for additional cardiac markers.  Low suspicion for pulmonary embolism.  Presentation not classic for aortic dissection or esophageal perforation.  Chest x-ray without evidence suggestive of pneumonia, pneumothorax, pneumomediastinum.  No abnormal contour of the mediastinum to suggest dissection. No evidence of acute injuries.  The patient is safe for discharge with strict return precautions.   Final Clinical Impression(s) / ED Diagnoses Final diagnoses:  Atypical chest pain   Disposition: Discharge  Condition: Good  I have discussed the results, Dx and Tx plan with the patient who expressed understanding and agree(s) with the plan. Discharge instructions discussed at great length. The patient was given strict return precautions who verbalized understanding of the instructions. No further questions at time of discharge.    ED Discharge Orders    None       Follow Up: [facility] [address] [place] [address] [phone]  Schedule an appointment as soon as possible for a visit  As needed       This chart was dictated using voice recognition software.  Despite best efforts to proofread,  errors can occur which can change the documentation meaning.   Nira Conn, MD 01/12/18 [phone]

## 2018-01-12 NOTE — ED Notes (Signed)
RN went in to check on pt, "why do so many people come in and bother me.... I'm fine."

## 2018-01-12 NOTE — ED Notes (Signed)
Refused an iv to be inserted. Per patient "I don't have chest pains anymore and I need to get out of this place"

## 2018-01-13 ENCOUNTER — Encounter (HOSPITAL_COMMUNITY): Payer: Self-pay

## 2018-01-13 ENCOUNTER — Emergency Department (HOSPITAL_COMMUNITY)
Admission: EM | Admit: 2018-01-13 | Discharge: 2018-01-13 | Disposition: A | Discharge status: Home / Self Care | Payer: Medicare Other | Attending: Emergency Medicine | Admitting: Emergency Medicine

## 2018-01-13 ENCOUNTER — Other Ambulatory Visit: Payer: Self-pay

## 2018-01-13 DIAGNOSIS — J45909 Unspecified asthma, uncomplicated: Secondary | ICD-10-CM | POA: Insufficient documentation

## 2018-01-13 DIAGNOSIS — F17210 Nicotine dependence, cigarettes, uncomplicated: Secondary | ICD-10-CM | POA: Insufficient documentation

## 2018-01-13 DIAGNOSIS — L03011 Cellulitis of right finger: Secondary | ICD-10-CM | POA: Diagnosis not present

## 2018-01-13 DIAGNOSIS — I10 Essential (primary) hypertension: Secondary | ICD-10-CM | POA: Insufficient documentation

## 2018-01-13 DIAGNOSIS — M79644 Pain in right finger(s): Secondary | ICD-10-CM | POA: Diagnosis present

## 2018-01-13 MED ORDER — CEPHALEXIN 500 MG PO CAPS: 500.0000 mg | ORAL_CAPSULE | Freq: Four times a day (QID) | ORAL | 0 refills | Status: DC

## 2018-01-13 NOTE — ED Triage Notes (Signed)
Pt states her right middle finger is hurting her. Pt states she was clipping her nails and got the skin.

## 2018-01-13 NOTE — ED Provider Notes (Signed)
[facility]-EMERGENCY DEPT Provider Note   CSN: [id number] Arrival date & time: 01/13/18  1021     History   Chief Complaint Chief Complaint  Patient presents with  . Hand Pain    HPI Ashley Hodge is a 57 y.o. female.  57 year old female with prior history as documented below presents with complaint of finger pain. She complains of pain to the lateral nail fold of her right middle finger. Symptoms started 3 days prior. She denies fever. She reportedly has been taking Cipro for a UTI. She denies injury.   The history is provided by the patient.  Illness  This is a new problem. The current episode started more than 2 days ago. The problem occurs constantly. The problem has not changed since onset.Pertinent negatives include no chest pain, no abdominal pain, no headaches and no shortness of breath. Nothing aggravates the symptoms. Nothing relieves the symptoms. She has tried nothing for the symptoms.    Past Medical History:  Diagnosis Date  . Asthma   . COPD (chronic obstructive pulmonary disease) (HCC)   . High cholesterol   . Hypertension     There are no active problems to display for this patient.   Past Surgical History:  Procedure Laterality Date  . TUBAL LIGATION       OB History    Gravida      Para      Term      Preterm      AB      Living  5     SAB      TAB      Ectopic      Multiple      Live Births               Home Medications    Prior to Admission medications   Medication Sig Start Date End Date Taking? Authorizing Provider  amLODipine (NORVASC) 5 MG tablet Take 5 mg by mouth daily. 09/07/17   [provider]  ciprofloxacin (CIPRO) 500 MG tablet Take 1 tablet (500 mg total) by mouth 2 (two) times daily. One po bid x 7 days 01/10/18   Geoffery Lyons, MD  hydrochlorothiazide (HYDRODIURIL) 12.5 MG tablet Take 12.5 mg by mouth daily. 09/07/17   [provider]  losartan (COZAAR) 50 MG tablet  Take 50 mg by mouth daily. 09/07/17   [provider]  omeprazole (PRILOSEC) 20 MG capsule Take 1 capsule (20 mg total) by mouth daily for 14 days. 01/11/18 01/25/18  Wieters, Hallie C, PA-C  ondansetron (ZOFRAN ODT) 8 MG disintegrating tablet Take 1 tablet (8 mg total) by mouth every 8 (eight) hours as needed for nausea or vomiting. 01/08/18   Linwood Dibbles, MD  pravastatin (PRAVACHOL) 80 MG tablet Take 80 mg by mouth daily. 09/07/17   [provider]    Family History Family History  Problem Relation Age of Onset  . Heart disease Mother   . Hypertension Mother     Social History Social History   Tobacco Use  . Smoking status: Current Every Day Smoker    Packs/day: 1.00    Types: Cigarettes  . Smokeless tobacco: Never Used  Substance Use Topics  . Alcohol use: Not Currently    Frequency: Never    Comment: "clean x 4 yrs"  . Drug use: Never     Allergies   Lisinopril   Review of Systems Review of Systems  Respiratory: Negative for shortness of breath.  Cardiovascular: Negative for chest pain.  Gastrointestinal: Negative for abdominal pain.  Musculoskeletal:       Right middle finger pain  Neurological: Negative for headaches.  All other systems reviewed and are negative.    Physical Exam Updated Vital Signs BP 116/72   Pulse 72   Temp 98.1 F (36.7 C) (Oral)   Resp 16   Ht 5' (1.524 m)   Wt 68 kg   SpO2 98%   BMI 29.29 kg/m   Physical Exam  Constitutional: She is oriented to person, place, and time. She appears well-developed and well-nourished. No distress.  HENT:  Head: Normocephalic and atraumatic.  Mouth/Throat: Oropharynx is clear and moist.  Eyes: Pupils are equal, round, and reactive to light. Conjunctivae and EOM are normal.  Neck: Normal range of motion. Neck supple.  Cardiovascular: Normal rate, regular rhythm and normal heart sounds.  Pulmonary/Chest: Effort normal. No respiratory distress.  Abdominal: Soft. She exhibits no  distension. There is no tenderness.  Musculoskeletal: Normal range of motion. She exhibits no edema or deformity.  Mild tenderness to lateral nail fold of the right middle finger  No discrete purulence seen on exam   No overlying erythema or edema  Neurological: She is alert and oriented to person, place, and time.  Skin: Skin is warm and dry.  Nursing note and vitals reviewed.    ED Treatments / Results  Labs (all labs ordered are listed, but only abnormal results are displayed) Labs Reviewed - No data to display  EKG None  Radiology Dg Chest 2 View  Result Date: 01/12/2018 CLINICAL DATA:  Centralized chest pain EXAM: CHEST - 2 VIEW COMPARISON:  01/02/2018 FINDINGS: Heart and mediastinal contours are within normal limits. No focal opacities or effusions. No acute bony abnormality. IMPRESSION: No active cardiopulmonary disease. Electronically Signed   By: Charlett NoseKevin  Dover M.D.   On: 01/12/2018 02:24    Procedures Procedures (including critical care time)  Medications Ordered in ED Medications - No data to display   Initial Impression / Assessment and Plan / ED Course  I have reviewed the triage vital signs and the nursing notes.  Pertinent labs & imaging results that were available during my care of the patient were reviewed by me and considered in my medical decision making (see chart for details).     MDM  Screen complete  Patient is presenting with complaint of finger pain.  I suspect that the patient may have a very early paronychia.  Patient will be given Keflex.  Patient offered I&D.  She declines the same.  She prefers to apply warm soaks and take antibiotics at this time.  Patient is advised that if the pain or swelling worsens she should absolutely return to the ED for an I&D at that time.  Close follow-up stressed.  Strict return precautions given and understood.  Final Clinical Impressions(s) / ED Diagnoses   Final diagnoses:  Paronychia of finger of right  hand    ED Discharge Orders         Ordered    cephALEXin (KEFLEX) 500 MG capsule  4 times daily     01/13/18 1136           Wynetta FinesMessick, Legaci Tarman C, MD 01/13/18 1153

## 2018-01-13 NOTE — ED Notes (Signed)
Pt finger wrapped per MD

## 2018-01-13 NOTE — ED Notes (Signed)
MD at bedside. 

## 2018-01-13 NOTE — Discharge Instructions (Addendum)
Please return for any problem.  Follow-up with your regular doctor as instructed.  If pain or swelling worsens please return to the Ed. Stop taking Cipro.

## 2018-01-14 ENCOUNTER — Other Ambulatory Visit: Payer: Self-pay

## 2018-01-14 ENCOUNTER — Encounter (HOSPITAL_COMMUNITY): Payer: Self-pay

## 2018-01-14 ENCOUNTER — Emergency Department (HOSPITAL_COMMUNITY)
Admission: EM | Admit: 2018-01-14 | Discharge: 2018-01-14 | Disposition: A | Discharge status: Home / Self Care | Payer: Medicare Other | Attending: Emergency Medicine | Admitting: Emergency Medicine

## 2018-01-14 DIAGNOSIS — J449 Chronic obstructive pulmonary disease, unspecified: Secondary | ICD-10-CM | POA: Diagnosis not present

## 2018-01-14 DIAGNOSIS — F17210 Nicotine dependence, cigarettes, uncomplicated: Secondary | ICD-10-CM | POA: Insufficient documentation

## 2018-01-14 DIAGNOSIS — L03011 Cellulitis of right finger: Secondary | ICD-10-CM | POA: Diagnosis not present

## 2018-01-14 DIAGNOSIS — Z7902 Long term (current) use of antithrombotics/antiplatelets: Secondary | ICD-10-CM | POA: Diagnosis not present

## 2018-01-14 DIAGNOSIS — I10 Essential (primary) hypertension: Secondary | ICD-10-CM | POA: Diagnosis not present

## 2018-01-14 DIAGNOSIS — Z79899 Other long term (current) drug therapy: Secondary | ICD-10-CM | POA: Insufficient documentation

## 2018-01-14 DIAGNOSIS — M79644 Pain in right finger(s): Secondary | ICD-10-CM | POA: Diagnosis present

## 2018-01-14 MED ORDER — ACETAMINOPHEN 325 MG PO TABS
650.0000 mg | ORAL_TABLET | Freq: Once | ORAL | Status: AC
  Administered 2018-01-14: 650 mg via ORAL
  Filled 2018-01-14: qty 2

## 2018-01-14 NOTE — ED Triage Notes (Signed)
Pt returns for reeval of her R middle finger. Endorses pain and states that she wants it drained. A&Ox4.

## 2018-01-14 NOTE — Discharge Instructions (Signed)
You are seen today for small abscess of your finger.  Please soak this in warm water at least 4 times a day.  Return to the emergency department if you develop any increasing pain, swelling, or redness of the finger.  Please contact the clinic located on the phone your paperwork to establish primary care.

## 2018-01-14 NOTE — ED Provider Notes (Signed)
[facility]-EMERGENCY DEPT Provider Note   CSN: [id number] Arrival date & time: 01/14/18  1458     History   Chief Complaint Chief Complaint  Patient presents with  . Hand Pain    HPI Ashley Hodge is a 57 y.o. female.  HPI   Patient is a 57-year female with history of asthma, COPD, hyperlipidemia, and hypertension presenting for pain to the tip of the right middle finger.  Patient reports that for the past 2 to 3 days, she has had increasing swelling at the tip, and was evaluated yesterday and given antibiotics that she has not been able to pick up yet.  Patient believes that there is now an abscess that needs to be drained so she presents for reevaluation.  Patient denies any pain or swelling to the pad of the finger, fusiform swelling of the digit, pain or erythema extending to the hand, or fever chills.  Tetanus shot is up-to-date.  Patient reports that at present, she is homeless, and is unable to get into the [facility] due to having insurance.  Patient reports she is without a PCP at present after moving from [place].  Past Medical History:  Diagnosis Date  . Asthma   . COPD (chronic obstructive pulmonary disease) (HCC)   . High cholesterol   . Hypertension     There are no active problems to display for this patient.   Past Surgical History:  Procedure Laterality Date  . TUBAL LIGATION       OB History    Gravida      Para      Term      Preterm      AB      Living  5     SAB      TAB      Ectopic      Multiple      Live Births               Home Medications    Prior to Admission medications   Medication Sig Start Date End Date Taking? Authorizing Provider  amLODipine (NORVASC) 5 MG tablet Take 5 mg by mouth daily. 09/07/17   [provider]  cephALEXin (KEFLEX) 500 MG capsule Take 1 capsule (500 mg total) by mouth 4 (four) times daily. 01/13/18   Wynetta Fines, MD  ciprofloxacin (CIPRO) 500 MG tablet Take  1 tablet (500 mg total) by mouth 2 (two) times daily. One po bid x 7 days 01/10/18   Geoffery Lyons, MD  hydrochlorothiazide (HYDRODIURIL) 12.5 MG tablet Take 12.5 mg by mouth daily. 09/07/17   [provider]  losartan (COZAAR) 50 MG tablet Take 50 mg by mouth daily. 09/07/17   [provider]  omeprazole (PRILOSEC) 20 MG capsule Take 1 capsule (20 mg total) by mouth daily for 14 days. 01/11/18 01/25/18  Wieters, Hallie C, PA-C  ondansetron (ZOFRAN ODT) 8 MG disintegrating tablet Take 1 tablet (8 mg total) by mouth every 8 (eight) hours as needed for nausea or vomiting. 01/08/18   Linwood Dibbles, MD  pravastatin (PRAVACHOL) 80 MG tablet Take 80 mg by mouth daily. 09/07/17   [provider]    Family History Family History  Problem Relation Age of Onset  . Heart disease Mother   . Hypertension Mother     Social History Social History   Tobacco Use  . Smoking status: Current Every Day Smoker    Packs/day: 1.00    Types: Cigarettes  .  Smokeless tobacco: Never Used  Substance Use Topics  . Alcohol use: Not Currently    Frequency: Never    Comment: "clean x 4 yrs"  . Drug use: Never     Allergies   Lisinopril   Review of Systems Review of Systems  Constitutional: Negative for chills and fever.  Musculoskeletal: Positive for arthralgias.  Skin: Positive for color change.  Neurological: Negative for weakness and numbness.     Physical Exam Updated Vital Signs BP 122/79 (BP Location: Left Arm)   Pulse 87   Temp 98.7 F (37.1 C) (Oral)   Resp 19   SpO2 98%   Physical Exam  Constitutional: She appears well-developed and well-nourished. No distress.  Sitting comfortably in bed.  HENT:  Head: Normocephalic and atraumatic.  Eyes: Conjunctivae are normal. Right eye exhibits no discharge. Left eye exhibits no discharge.  EOMs normal to gross examination.  Neck: Normal range of motion.  Cardiovascular: Normal rate and regular rhythm.  Intact, 2+ radial  pulse of RUE.  Pulmonary/Chest:  Normal respiratory effort. Patient converses comfortably. No audible wheeze or stridor.  Abdominal: She exhibits no distension.  Musculoskeletal: Normal range of motion.  Right middle finger exhibits small area of purulence on the ulnar aspect of nailbed.  Minimal surrounding erythema.  No tenderness palpation of the pad of the finger.  No fusiform swelling of the digit.  Patient has full range of motion with flexion extension of all joints of her right middle finger.  Neurological: She is alert.  Cranial nerves intact to gross observation. Patient moves extremities without difficulty.  Skin: Skin is warm and dry. She is not diaphoretic.  Psychiatric: She has a normal mood and affect. Her behavior is normal. Judgment and thought content normal.  Nursing note and vitals reviewed.    ED Treatments / Results  Labs (all labs ordered are listed, but only abnormal results are displayed) Labs Reviewed - No data to display  EKG None  Radiology No results found.  Procedures .Marland KitchenIncision and Drainage Date/Time: 01/14/2018 6:06 PM Performed by: Elisha Ponder, PA-C Authorized by: Elisha Ponder, PA-C   Consent:    Consent obtained:  Verbal   Consent given by:  Patient   Risks discussed:  Incomplete drainage and pain   Alternatives discussed:  No treatment Location:    Indications for incision and drainage: paronychia.   Location:  Upper extremity Anesthesia (see MAR for exact dosages):    Anesthesia method:  None Procedure type:    Complexity:  Simple Procedure details:    Incision types:  Stab incision   Incision depth:  Dermal   Scalpel blade:  11   Drainage:  Purulent   Drainage amount:  Scant   (including critical care time)  Medications Ordered in ED Medications  acetaminophen (TYLENOL) tablet 650 mg (650 mg Oral Given 01/14/18 1748)     Initial Impression / Assessment and Plan / ED Course  I have reviewed the triage vital signs and  the nursing notes.  Pertinent labs & imaging results that were available during my care of the patient were reviewed by me and considered in my medical decision making (see chart for details).     Patient nontoxic-appearing, afebrile, and in no acute distress.  Patient with small paronychia to the of nailbed of the right middle finger, amenable to I&D.  This was performed successfully and patient tolerated procedure well.  There is no evidence of extending infection, felon, or cellulitis or flexor tenosynovitis.  I encouraged patient to pick up her Keflex prescription prescribed yesterday, as well as perform frequent warm water soaks to promote drainage.  Patient was given return precautions for any increasing erythema, swelling, pain or purulence.  Patient expressed to me difficulty obtaining primary care provider in [facility], as well as her home situation.  Case management and social work consultations placed today, but patient reported that she could not stay. Encouraged patient to return for any new or worsening symptoms.  Final Clinical Impressions(s) / ED Diagnoses   Final diagnoses:  Paronychia of finger, right    ED Discharge Orders    None       Delia Chimes 01/14/18 1815    Mancel Bale, MD 01/15/18 [phone]

## 2018-01-15 ENCOUNTER — Emergency Department (HOSPITAL_COMMUNITY)
Admission: EM | Admit: 2018-01-15 | Discharge: 2018-01-15 | Disposition: A | Discharge status: Home / Self Care | Payer: Medicare Other | Attending: Emergency Medicine | Admitting: Emergency Medicine

## 2018-01-15 ENCOUNTER — Other Ambulatory Visit: Payer: Self-pay

## 2018-01-15 ENCOUNTER — Emergency Department (HOSPITAL_COMMUNITY): Payer: Medicare Other

## 2018-01-15 ENCOUNTER — Encounter (HOSPITAL_COMMUNITY): Payer: Self-pay | Admitting: Emergency Medicine

## 2018-01-15 DIAGNOSIS — I10 Essential (primary) hypertension: Secondary | ICD-10-CM | POA: Insufficient documentation

## 2018-01-15 DIAGNOSIS — F17210 Nicotine dependence, cigarettes, uncomplicated: Secondary | ICD-10-CM | POA: Insufficient documentation

## 2018-01-15 DIAGNOSIS — J449 Chronic obstructive pulmonary disease, unspecified: Secondary | ICD-10-CM | POA: Insufficient documentation

## 2018-01-15 DIAGNOSIS — L03011 Cellulitis of right finger: Secondary | ICD-10-CM | POA: Diagnosis not present

## 2018-01-15 DIAGNOSIS — M25512 Pain in left shoulder: Secondary | ICD-10-CM

## 2018-01-15 DIAGNOSIS — Z79899 Other long term (current) drug therapy: Secondary | ICD-10-CM | POA: Insufficient documentation

## 2018-01-15 DIAGNOSIS — Z7902 Long term (current) use of antithrombotics/antiplatelets: Secondary | ICD-10-CM | POA: Diagnosis not present

## 2018-01-15 MED ORDER — CEPHALEXIN 500 MG PO CAPS: 500.0000 mg | ORAL_CAPSULE | Freq: Three times a day (TID) | ORAL | Status: DC

## 2018-01-15 MED ORDER — CEPHALEXIN 500 MG PO CAPS: 500.0000 mg | ORAL_CAPSULE | Freq: Four times a day (QID) | ORAL | 0 refills | Status: AC

## 2018-01-15 MED ORDER — IBUPROFEN 800 MG PO TABS
800.0000 mg | ORAL_TABLET | Freq: Once | ORAL | Status: AC
  Administered 2018-01-15: 800 mg via ORAL
  Filled 2018-01-15: qty 1

## 2018-01-15 NOTE — ED Provider Notes (Signed)
[facility]-EMERGENCY DEPT Provider Note  CSN: [id number] Arrival date & time: 01/15/18  1321    History   Chief Complaint Chief Complaint  Patient presents with  . Shoulder Pain    HPI Ashley Hodge is a 57 y.o. female with a medical history of asthma, COPD and HTN who presented to the ED for left shoulder pain x1 day. She describes dull aching which is worse with movement. Denies paresthesias, weakness or neck pain. Denies recent trauma, injuries or falls. Patient also requests wound check for paronychia that was drained yesterday. Denies fever, erythema, swelling or warmth. Reports decreased ROM and tenderness over the phalanxes. Patient has tried nothing prior to coming to the ED.  Past Medical History:  Diagnosis Date  . Asthma   . COPD (chronic obstructive pulmonary disease) (HCC)   . High cholesterol   . Hypertension     There are no active problems to display for this patient.   Past Surgical History:  Procedure Laterality Date  . TUBAL LIGATION       OB History    Gravida      Para      Term      Preterm      AB      Living  5     SAB      TAB      Ectopic      Multiple      Live Births               Home Medications    Prior to Admission medications   Medication Sig Start Date End Date Taking? Authorizing Provider  amLODipine (NORVASC) 5 MG tablet Take 5 mg by mouth daily. 09/07/17   [provider]  cephALEXin (KEFLEX) 500 MG capsule Take 1 capsule (500 mg total) by mouth 4 (four) times daily for 7 days. 01/15/18 01/22/18  Illya Gienger, Jerrel Ivory I, PA-C  ciprofloxacin (CIPRO) 500 MG tablet Take 1 tablet (500 mg total) by mouth 2 (two) times daily. One po bid x 7 days 01/10/18   Geoffery Lyons, MD  hydrochlorothiazide (HYDRODIURIL) 12.5 MG tablet Take 12.5 mg by mouth daily. 09/07/17   [provider]  losartan (COZAAR) 50 MG tablet Take 50 mg by mouth daily. 09/07/17   [provider]  omeprazole  (PRILOSEC) 20 MG capsule Take 1 capsule (20 mg total) by mouth daily for 14 days. 01/11/18 01/25/18  Wieters, Hallie C, PA-C  ondansetron (ZOFRAN ODT) 8 MG disintegrating tablet Take 1 tablet (8 mg total) by mouth every 8 (eight) hours as needed for nausea or vomiting. 01/08/18   Linwood Dibbles, MD  pravastatin (PRAVACHOL) 80 MG tablet Take 80 mg by mouth daily. 09/07/17   [provider]    Family History Family History  Problem Relation Age of Onset  . Heart disease Mother   . Hypertension Mother     Social History Social History   Tobacco Use  . Smoking status: Current Every Day Smoker    Packs/day: 1.00    Types: Cigarettes  . Smokeless tobacco: Never Used  Substance Use Topics  . Alcohol use: Not Currently    Frequency: Never    Comment: "clean x 4 yrs"  . Drug use: Never     Allergies   Lisinopril   Review of Systems Review of Systems  Constitutional: Negative for chills, fatigue and fever.  Musculoskeletal: Positive for arthralgias. Negative for joint swelling.  Skin: Negative.   Neurological: Negative  for weakness and numbness.  Psychiatric/Behavioral: Negative.    Physical Exam Updated Vital Signs BP 128/76 (BP Location: Left Arm)   Pulse (!) 107   Temp 98.7 F (37.1 C) (Oral)   Resp 18   SpO2 98%   Physical Exam  Constitutional:  Undernourished.  Neck: Normal range of motion and full passive range of motion without pain. Neck supple. No spinous process tenderness and no muscular tenderness present. Normal range of motion present.  Musculoskeletal:       Left shoulder: She exhibits decreased range of motion and tenderness. She exhibits no bony tenderness, no swelling and no deformity.  Bony tenderness along middle phalanx of 3rd digit. Decreased flexion in this finger. Left shoulder: No bony tenderness or deformity. Full ROM with 5/5 strength, but endorses pain with movements. + nerve impingement tests.  Neurological: She has normal strength. No  sensory deficit. She exhibits normal muscle tone.  Reflex Scores:      Bicep reflexes are 2+ on the right side and 2+ on the left side.      Brachioradialis reflexes are 2+ on the right side and 2+ on the left side. Skin: Skin is warm and intact. No bruising and no rash noted. No erythema.  Right 3rd digit non-erythematous or edematous. Tenderness along medial nail fold without fluctuance. Not actively draining or bleeding.  Nursing note and vitals reviewed.  ED Treatments / Results  Labs (all labs ordered are listed, but only abnormal results are displayed) Labs Reviewed - No data to display  EKG None  Radiology Dg Hand Complete Right  Result Date: 01/15/2018 CLINICAL DATA:  Pain and swelling at the distal aspect of the right long finger after trimming nails 2 days ago. Possible infection. EXAM: RIGHT HAND - COMPLETE 3+ VIEW COMPARISON:  Plain films right hand 01/06/2018. FINDINGS: There is no evidence of fracture or dislocation. There is no evidence of arthropathy or other focal bone abnormality. Soft tissues are unremarkable. IMPRESSION: Normal exam. Electronically Signed   By: Drusilla Kanner M.D.   On: 01/15/2018 15:05    Procedures Procedures (including critical care time)  Medications Ordered in ED Medications  cephALEXin (KEFLEX) capsule 500 mg (500 mg Oral Not Given 01/15/18 1608)  ibuprofen (ADVIL,MOTRIN) tablet 800 mg (800 mg Oral Given 01/15/18 1506)     Initial Impression / Assessment and Plan / ED Course  Triage vital signs and the nursing notes have been reviewed.  Pertinent labs & imaging results that were available during care of the patient were reviewed and considered in medical decision making (see chart for details).  Patient presents for acute onset left shoulder pain. Patient is in no distress and well appearing. Patient has full sensation in left upper extremity. She also has full active and passive ROM, but endorses pain with abduction mostly. No deformities,  decreased muscle tone or other abnormalities visualized. Neurovascular function is intact. Physical exam overall reassuring. There are no other physical exam findings or s/s that suggest an underlying infectious or rheumatologic process that warrant further evaluation or intervention today.  Patient had paronychia on right 3rd digit drained yesterday 01/14/18 and this area was inspected. No visual signs of worsening infection, but patient endorses painful finger flexion and bony tenderness, especially along middle phalanx. Will obtain imaging to evaluate for possible osteomyelitis.  Clinical Course as of Jan 15 1649  Mon Jan 15, 2018  1520 No fracture or dislocation of 3rd metacarpal. No evidence of bony infiltration either.   [GM]  Clinical Course User Index [GM] Loria Lacina, Sharyon Medicus, PA-C   It is suspected that patient is malingering for the secondary gains of housing. Patient has been to the ED 15 times in the last 21 days for various medical complaints. She states that she is without housing until tomorrow 01/16/18. Patient asks "Can you please just keep me in the hospital until tomorrow?"  Final Clinical Impressions(s) / ED Diagnoses  1. Left Shoulder Pain. Education provided on OTC and supportive treatment for pain relief. 2. Paronychia Follow-Up. No evidence of pus recollection. Patient has not started antibiotic therapy because of unable to get her Rx filled over the weekend and with the holiday. Will give one dose of Keflex in the ED. New Rx written as patient states she lost Rx written on 01/13/18.  Dispo: Home. After thorough clinical evaluation, this patient is determined to be medically stable and can be safely discharged with the previously mentioned treatment and/or outpatient follow-up/referral(s). At this time, there are no other apparent medical conditions that require further screening, evaluation or treatment.   Final diagnoses:  Acute pain of left shoulder  Paronychia of  finger of right hand    ED Discharge Orders         Ordered    cephALEXin (KEFLEX) 500 MG capsule  4 times daily     01/15/18 [address] [place] I, PA-C 01/15/18 1650    Arby Barrette, MD 02/01/18 1020

## 2018-01-15 NOTE — ED Triage Notes (Signed)
Pt complaint of left shoulder pain when awoke from sleep; denies chest pain.

## 2018-01-15 NOTE — Discharge Instructions (Addendum)
I have written you a new prescription for the antibiotics you are supposed to take for your nail.   For the shoulder and finger pain, you may use Tylenol (650mg  three-four times daily) and/or Ibuprofen (800mg  three-four times daily) for pain relief and swelling. You received ibuprofen today. You may follow-up with your PCP if you continue to have issues for more than 4-6 weeks.  Good luck and stay safe and cool outside today!

## 2018-01-16 NOTE — Care Management (Signed)
CM consulted for no pcp.  CM spoke with pt via phone who stated she is permanently in the [place] area and is planning to call her insurance company today to find [employer].  Updated [place] Scrape, [place] and [place], [place] via messages.  No further CM needs noted at this time.

## 2018-01-20 ENCOUNTER — Emergency Department (HOSPITAL_COMMUNITY)
Admission: EM | Admit: 2018-01-20 | Discharge: 2018-01-20 | Disposition: A | Discharge status: Home / Self Care | Payer: Medicare Other | Attending: Emergency Medicine | Admitting: Emergency Medicine

## 2018-01-20 ENCOUNTER — Emergency Department (HOSPITAL_COMMUNITY): Payer: Medicare Other

## 2018-01-20 ENCOUNTER — Other Ambulatory Visit: Payer: Self-pay

## 2018-01-20 ENCOUNTER — Encounter (HOSPITAL_COMMUNITY): Payer: Self-pay

## 2018-01-20 DIAGNOSIS — Z5321 Procedure and treatment not carried out due to patient leaving prior to being seen by health care provider: Secondary | ICD-10-CM | POA: Diagnosis not present

## 2018-01-20 DIAGNOSIS — R0789 Other chest pain: Secondary | ICD-10-CM | POA: Diagnosis present

## 2018-01-20 LAB — BASIC METABOLIC PANEL
ANION GAP: 10 (ref 5–15)
BUN: 11 mg/dL (ref 6–20)
CALCIUM: 8.9 mg/dL (ref 8.9–10.3)
CO2: 27 mmol/L (ref 22–32)
CREATININE: 0.9 mg/dL (ref 0.44–1.00)
Chloride: 106 mmol/L (ref 98–111)
GFR calc Af Amer: >60 mL/min (ref >60)
GLUCOSE: 114 mg/dL — AB (ref 70–99)
Potassium: 4 mmol/L (ref 3.5–5.1)
Sodium: 143 mmol/L (ref 135–145)

## 2018-01-20 LAB — I-STAT TROPONIN, ED: TROPONIN I, POC: 0.01 ng/mL (ref 0.00–0.08)

## 2018-01-20 LAB — CBC
HCT: 40.5 % (ref 36.0–46.0)
HEMOGLOBIN: 13.3 g/dL (ref 12.0–15.0)
MCH: 31.2 pg (ref 26.0–34.0)
MCHC: 32.8 g/dL (ref 30.0–36.0)
MCV: 95.1 fL (ref 78.0–100.0)
PLATELETS: 333 10*3/uL (ref 150–400)
RBC: 4.26 MIL/uL (ref 3.87–5.11)
RDW: 12.4 % (ref 11.5–15.5)
WBC: 12.7 10*3/uL — ABNORMAL HIGH (ref 4.0–10.5)

## 2018-01-20 NOTE — ED Triage Notes (Addendum)
Patient c/o mid lower chest pain this AM. Patient states she had left rib cage area pain 2 days ago. Patient denies any SOB.   Patient rates pain 7/10.

## 2018-01-21 ENCOUNTER — Other Ambulatory Visit: Payer: Self-pay

## 2018-01-21 ENCOUNTER — Encounter (HOSPITAL_COMMUNITY): Payer: Self-pay

## 2018-01-21 ENCOUNTER — Emergency Department (HOSPITAL_COMMUNITY)
Admission: EM | Admit: 2018-01-21 | Discharge: 2018-01-22 | Disposition: A | Discharge status: Home / Self Care | Payer: Medicare Other | Attending: Emergency Medicine | Admitting: Emergency Medicine

## 2018-01-21 DIAGNOSIS — Z79899 Other long term (current) drug therapy: Secondary | ICD-10-CM | POA: Diagnosis not present

## 2018-01-21 DIAGNOSIS — N390 Urinary tract infection, site not specified: Secondary | ICD-10-CM | POA: Insufficient documentation

## 2018-01-21 DIAGNOSIS — I10 Essential (primary) hypertension: Secondary | ICD-10-CM | POA: Insufficient documentation

## 2018-01-21 DIAGNOSIS — R002 Palpitations: Secondary | ICD-10-CM | POA: Diagnosis present

## 2018-01-21 DIAGNOSIS — J449 Chronic obstructive pulmonary disease, unspecified: Secondary | ICD-10-CM | POA: Insufficient documentation

## 2018-01-21 DIAGNOSIS — F17210 Nicotine dependence, cigarettes, uncomplicated: Secondary | ICD-10-CM | POA: Insufficient documentation

## 2018-01-21 DIAGNOSIS — R8281 Pyuria: Secondary | ICD-10-CM

## 2018-01-21 DIAGNOSIS — F419 Anxiety disorder, unspecified: Secondary | ICD-10-CM | POA: Insufficient documentation

## 2018-01-21 DIAGNOSIS — F411 Generalized anxiety disorder: Secondary | ICD-10-CM

## 2018-01-21 NOTE — ED Notes (Signed)
Pt BIB [facility] for anxiety. She reports that she feels like her heart is racing. VSS.

## 2018-01-22 ENCOUNTER — Other Ambulatory Visit: Payer: Self-pay

## 2018-01-22 DIAGNOSIS — R002 Palpitations: Secondary | ICD-10-CM | POA: Diagnosis not present

## 2018-01-22 LAB — URINALYSIS, ROUTINE W REFLEX MICROSCOPIC
BILIRUBIN URINE: NEGATIVE
Glucose, UA: NEGATIVE mg/dL
Ketones, ur: NEGATIVE mg/dL
NITRITE: NEGATIVE
Protein, ur: NEGATIVE mg/dL
Specific Gravity, Urine: 1.005 (ref 1.005–1.030)
pH: 7 (ref 5.0–8.0)

## 2018-01-22 MED ORDER — FOSFOMYCIN TROMETHAMINE 3 G PO PACK
3.0000 g | PACK | Freq: Once | ORAL | Status: AC
  Administered 2018-01-22: 3 g via ORAL
  Filled 2018-01-22: qty 3

## 2018-01-22 NOTE — ED Notes (Signed)
Bus pass and sandwhich given per pt request

## 2018-01-22 NOTE — Discharge Instructions (Signed)
We believe that your palpitations/rapid heart rate are due to episodes of anxiety.  We recommend follow-up with [facility] or other behavioral health provider for further evaluation of the symptoms.  Given ongoing discomfort with urination, you had a urinalysis performed.  There is evidence of white blood cells in your urine which may represent untreated infection.  You were given a dose of Monurol which should appropriately treat any UTI.  Follow-up with your primary care doctor to ensure that this resolves.  You may return for new or concerning symptoms.

## 2018-01-22 NOTE — ED Provider Notes (Signed)
[facility]-EMERGENCY DEPT Provider Note   CSN: [id number] Arrival date & time: 01/21/18  2214     History   Chief Complaint Chief Complaint  Patient presents with  . Anxiety    HPI Ashley Hodge is a 57 y.o. female.   57 year old female presents to the emergency department for evaluation of palpitations and chest pain.  History of asthma, COPD, hypertension.  Reports that she has been experiencing pain in her mid to lower chest since yesterday morning.  This has been intermittent, nonradiating over the past 2 days.  Palpitations associated with chest discomfort characterized by rapidly beating heart. She denies any known aggravating factors of her symptoms, but does acknowledge that she feels like her symptoms worsen when her anxiety is heightened.  She has not taken any medications for her symptoms.  Does check her blood pressure when experiencing palpitations and reports that she is often hypertensive with systolic in the 140s.  No associated shortness of breath, diaphoresis, vomiting.  Has been experiencing recurrence of the symptoms since her boyfriend died only of a heart attack in June.  As an aside, patient reports that she has been experiencing waxing and waning dysuria.  This has been present since the end of August.  She was previously prescribed a course of ciprofloxacin which she did not complete.  Has not had any fevers, inability to void.  Denies sexual activity.     Past Medical History:  Diagnosis Date  . Asthma   . COPD (chronic obstructive pulmonary disease) (HCC)   . High cholesterol   . Hypertension     There are no active problems to display for this patient.   Past Surgical History:  Procedure Laterality Date  . TUBAL LIGATION       OB History    Gravida      Para      Term      Preterm      AB      Living  5     SAB      TAB      Ectopic      Multiple      Live Births               Home Medications     Prior to Admission medications   Medication Sig Start Date End Date Taking? Authorizing Provider  amLODipine (NORVASC) 5 MG tablet Take 5 mg by mouth daily. 09/07/17   [provider]  cephALEXin (KEFLEX) 500 MG capsule Take 1 capsule (500 mg total) by mouth 4 (four) times daily for 7 days. 01/15/18 01/22/18  Mortis, Jerrel Ivory I, PA-C  ciprofloxacin (CIPRO) 500 MG tablet Take 1 tablet (500 mg total) by mouth 2 (two) times daily. One po bid x 7 days 01/10/18   Geoffery Lyons, MD  hydrochlorothiazide (HYDRODIURIL) 12.5 MG tablet Take 12.5 mg by mouth daily. 09/07/17   [provider]  losartan (COZAAR) 50 MG tablet Take 50 mg by mouth daily. 09/07/17   [provider]  omeprazole (PRILOSEC) 20 MG capsule Take 1 capsule (20 mg total) by mouth daily for 14 days. 01/11/18 01/25/18  Wieters, Hallie C, PA-C  ondansetron (ZOFRAN ODT) 8 MG disintegrating tablet Take 1 tablet (8 mg total) by mouth every 8 (eight) hours as needed for nausea or vomiting. 01/08/18   Linwood Dibbles, MD  pravastatin (PRAVACHOL) 80 MG tablet Take 80 mg by mouth daily. 09/07/17   [provider]    Family  History Family History  Problem Relation Age of Onset  . Heart disease Mother   . Hypertension Mother     Social History Social History   Tobacco Use  . Smoking status: Current Every Day Smoker    Packs/day: 1.00    Types: Cigarettes  . Smokeless tobacco: Never Used  Substance Use Topics  . Alcohol use: Not Currently    Frequency: Never    Comment: "clean x 4 yrs"  . Drug use: Never     Allergies   Lisinopril   Review of Systems Review of Systems Ten systems reviewed and are negative for acute change, except as noted in the HPI.    Physical Exam Updated Vital Signs BP 105/62   Pulse 85   Temp 98.9 F (37.2 C) (Oral)   Resp 18   SpO2 94%   Physical Exam  Constitutional: She is oriented to person, place, and time. She appears well-developed and well-nourished. No distress.   Nontoxic appearing and in no distress  HENT:  Head: Normocephalic and atraumatic.  Eyes: Conjunctivae and EOM are normal. No scleral icterus.  Neck: Normal range of motion.  Cardiovascular: Normal rate, regular rhythm and intact distal pulses.  Not tachycardic as noted in triage  Pulmonary/Chest: Effort normal. No stridor. No respiratory distress. She has no wheezes.  Respirations even and unlabored  Musculoskeletal: Normal range of motion.  Neurological: She is alert and oriented to person, place, and time. She exhibits normal muscle tone. Coordination normal.  Skin: Skin is warm and dry. No rash noted. She is not diaphoretic. No erythema. No pallor.  Psychiatric: She has a normal mood and affect. Her behavior is normal.  Nursing note and vitals reviewed.    ED Treatments / Results  Labs (all labs ordered are listed, but only abnormal results are displayed) Labs Reviewed  URINALYSIS, ROUTINE W REFLEX MICROSCOPIC - Abnormal; Notable for the following components:      Result Value   APPearance HAZY (*)    Hgb urine dipstick SMALL (*)    Leukocytes, UA LARGE (*)    Bacteria, UA RARE (*)    All other components within normal limits  URINE CULTURE    EKG EKG Interpretation  Date/Time:  Monday January 22 2018 00:43:55 EDT Ventricular Rate:  88 PR Interval:    QRS Duration: 92 QT Interval:  364 QTC Calculation: 441 R Axis:   23 Text Interpretation:  Sinus rhythm Low voltage, precordial leads No significant change since last tracing 20 Jan 2018 Confirmed by Devoria Albe ([id number]) on 01/22/2018 12:47:37 AM   Radiology Dg Chest 2 View  Result Date: 01/20/2018 CLINICAL DATA:  Chest pain EXAM: CHEST - 2 VIEW COMPARISON:  01/12/2018 chest radiograph. FINDINGS: Stable cardiomediastinal silhouette with normal heart size. No pneumothorax. No pleural effusion. Lungs appear clear, with no acute consolidative airspace disease and no pulmonary edema. IMPRESSION: No active cardiopulmonary  disease. Electronically Signed   By: Delbert Phenix M.D.   On: 01/20/2018 17:01    Procedures Procedures (including critical care time)  Medications Ordered in ED Medications  fosfomycin (MONUROL) packet 3 g (3 g Oral Given 01/22/18 0865)     Initial Impression / Assessment and Plan / ED Course  I have reviewed the triage vital signs and the nursing notes.  Pertinent labs & imaging results that were available during my care of the patient were reviewed by me and considered in my medical decision making (see chart for details).     57 year old female  presents to the emergency department for ongoing chest discomfort and palpitations.  I appreciate that her symptoms are secondary to anxiety.  She has been experiencing more of these episodes since the sudden passing of her boyfriend in June from a heart attack.  She had blood work completed yesterday with negative troponin, stable CBC and metabolic panel.  Her EKG today is nonischemic.  Chest x-ray from 01/20/2018 without evidence of acute cardiopulmonary abnormality.  Patient has been referred to [facility] for follow-up.  She was also given an outpatient resource guide for psychiatric follow-up.  Patient complaining of intermittent dysuria as well.  She has persistent pyuria on urinalysis today.  Patient treated for suspected cystitis with 1 dose of Monurol.  She has been encouraged to follow-up with her primary care doctor to ensure clearance of presumed UTI.  Return precautions discussed and provided. Patient discharged in stable condition with no unaddressed concerns.   Final Clinical Impressions(s) / ED Diagnoses   Final diagnoses:  Palpitations  Anxiety state  Pyuria    ED Discharge Orders    None       Antony Madura, PA-C 01/22/18 1610    Devoria Albe, MD 01/22/18 [id number]

## 2018-01-23 LAB — URINE CULTURE

## 2018-01-29 ENCOUNTER — Encounter (HOSPITAL_COMMUNITY): Payer: Self-pay | Admitting: Emergency Medicine

## 2018-01-29 ENCOUNTER — Emergency Department (HOSPITAL_COMMUNITY)
Admission: EM | Admit: 2018-01-29 | Discharge: 2018-01-30 | Disposition: A | Discharge status: Home / Self Care | Payer: Medicare Other | Attending: Emergency Medicine | Admitting: Emergency Medicine

## 2018-01-29 ENCOUNTER — Other Ambulatory Visit: Payer: Self-pay

## 2018-01-29 DIAGNOSIS — J449 Chronic obstructive pulmonary disease, unspecified: Secondary | ICD-10-CM | POA: Insufficient documentation

## 2018-01-29 DIAGNOSIS — F17210 Nicotine dependence, cigarettes, uncomplicated: Secondary | ICD-10-CM | POA: Insufficient documentation

## 2018-01-29 DIAGNOSIS — R1013 Epigastric pain: Secondary | ICD-10-CM | POA: Diagnosis not present

## 2018-01-29 DIAGNOSIS — R1010 Upper abdominal pain, unspecified: Secondary | ICD-10-CM | POA: Diagnosis present

## 2018-01-29 DIAGNOSIS — Z79899 Other long term (current) drug therapy: Secondary | ICD-10-CM | POA: Insufficient documentation

## 2018-01-29 DIAGNOSIS — I10 Essential (primary) hypertension: Secondary | ICD-10-CM | POA: Diagnosis not present

## 2018-01-29 LAB — COMPREHENSIVE METABOLIC PANEL
ALT: 20 U/L (ref 0–44)
AST: 24 U/L (ref 15–41)
Albumin: 3.9 g/dL (ref 3.5–5.0)
Alkaline Phosphatase: 86 U/L (ref 38–126)
Anion gap: 15 (ref 5–15)
BUN: 8 mg/dL (ref 6–20)
CHLORIDE: 106 mmol/L (ref 98–111)
CO2: 22 mmol/L (ref 22–32)
Calcium: 9.4 mg/dL (ref 8.9–10.3)
Creatinine, Ser: 0.85 mg/dL (ref 0.44–1.00)
Glucose, Bld: 126 mg/dL — ABNORMAL HIGH (ref 70–99)
POTASSIUM: 3.5 mmol/L (ref 3.5–5.1)
SODIUM: 143 mmol/L (ref 135–145)
Total Bilirubin: 0.6 mg/dL (ref 0.3–1.2)
Total Protein: 7.1 g/dL (ref 6.5–8.1)

## 2018-01-29 LAB — LIPASE, BLOOD: LIPASE: 31 U/L (ref 11–51)

## 2018-01-29 LAB — CBC
HEMATOCRIT: 42.6 % (ref 36.0–46.0)
HEMOGLOBIN: 13.7 g/dL (ref 12.0–15.0)
MCH: 31 pg (ref 26.0–34.0)
MCHC: 32.2 g/dL (ref 30.0–36.0)
MCV: 96.4 fL (ref 78.0–100.0)
Platelets: 325 10*3/uL (ref 150–400)
RBC: 4.42 MIL/uL (ref 3.87–5.11)
RDW: 11.9 % (ref 11.5–15.5)
WBC: 11.5 10*3/uL — AB (ref 4.0–10.5)

## 2018-01-29 LAB — I-STAT TROPONIN, ED: Troponin i, poc: 0 ng/mL (ref 0.00–0.08)

## 2018-01-29 NOTE — ED Notes (Addendum)
Patient left facility and coming back to facility to be seen.  Patient continues with epigastric pain, non radiating.  No nausea or vomiting.  Patient to have repeat EKG and vital signs.

## 2018-01-29 NOTE — ED Triage Notes (Signed)
Pt to ER for evaluation of LUQ abdominal pain/substernal chest pain x1 hour. Significant hx of GERD. EMS reports 12 lead unremarkable. No IV in place. VSS.

## 2018-01-30 DIAGNOSIS — R1013 Epigastric pain: Secondary | ICD-10-CM | POA: Diagnosis not present

## 2018-01-30 MED ORDER — FAMOTIDINE 20 MG PO TABS: 20.0000 mg | ORAL_TABLET | Freq: Two times a day (BID) | ORAL | 0 refills | Status: DC

## 2018-01-30 MED ORDER — FAMOTIDINE 20 MG PO TABS
20.0000 mg | ORAL_TABLET | Freq: Once | ORAL | Status: AC
  Administered 2018-01-30: 20 mg via ORAL
  Filled 2018-01-30: qty 1

## 2018-01-30 MED ORDER — PANTOPRAZOLE SODIUM 40 MG PO TBEC
40.0000 mg | DELAYED_RELEASE_TABLET | Freq: Every day | ORAL | Status: DC
  Administered 2018-01-30: 40 mg via ORAL
  Filled 2018-01-30: qty 1

## 2018-01-30 MED ORDER — PANTOPRAZOLE SODIUM 20 MG PO TBEC: 20.0000 mg | DELAYED_RELEASE_TABLET | Freq: Every day | ORAL | 1 refills | Status: DC

## 2018-01-30 MED FILL — FAMOTIDINE 20 MG TABLET: 20 | 5 days supply | Qty: 10 | Fill #0

## 2018-01-30 MED FILL — PANTOPRAZOLE SOD DR 20 MG T: 20 | 30 days supply | Qty: 30 | Fill #0

## 2018-01-30 NOTE — Discharge Instructions (Signed)
Acid Reflux Treatment  Diet: Start with a clear liquid diet, progressed to a full liquid diet, and then bland solids as you are able. Please adhere to the enclosed dietary suggestions.  In general, avoid NSAIDs (i.e. ibuprofen, naproxen, etc.), caffeine, alcohol, spicy foods, fatty foods, or any other foods that seem to cause your symptoms to arise.  Protonix: Take this medication daily, 20-30 minutes prior to your first meal, for the next 8 weeks.  Continue to take this medication even if you begin to feel better.  Pepcid: Take this medication twice a day for the next 5 days.  Follow-up: Please follow-up with your primary care provider on this matter.  Return: Return to the ED for significantly worsening symptoms, persistent vomiting, persistent fever, vomiting blood, blood in the stools, dark stools, or any other major concerns.

## 2018-01-30 NOTE — ED Provider Notes (Signed)
[facility] Provider Note   CSN: [id number] Arrival date & time: 01/29/18  1606     History   Chief Complaint Chief Complaint  Patient presents with  . Chest Pain    HPI Karis JubaKathy Kirkendall is a 57 y.o. female.  HPI   Karis JubaKathy Kleinman is a 57 y.o. female, with a history of asthma, COPD, high cholesterol, and HTN, presenting to the ED with upper abdominal pain  beginning 9/15. Pain is sharp, intermittent, radiating from epigastrium to LUQ, 8/10, lasts for about 5 minutes at a time, seems to occur with eating and lying down. Accompanied by one loose stool each on 9/15 and 9/16 as well as nausea. States she has had similar pain in the past and was diagnosed with GERD. Was prescribed a short course of prilosec, which helped with the above symptoms when they occurred previously, but now that she is out, the symptoms have returned.   Denies fever/chills, shortness of breath, fever, vomiting, hematochezia/melena, diaphoresis, dizziness, or any other complaints.    Past Medical History:  Diagnosis Date  . Asthma   . COPD (chronic obstructive pulmonary disease) (HCC)   . High cholesterol   . Hypertension     There are no active problems to display for this patient.   Past Surgical History:  Procedure Laterality Date  . TUBAL LIGATION       OB History    Gravida      Para      Term      Preterm      AB      Living  5     SAB      TAB      Ectopic      Multiple      Live Births               Home Medications    Prior to Admission medications   Medication Sig Start Date End Date Taking? Authorizing Provider  amLODipine (NORVASC) 5 MG tablet Take 5 mg by mouth daily. 09/07/17   [provider]  famotidine (PEPCID) 20 MG tablet Take 1 tablet (20 mg total) by mouth 2 (two) times daily for 5 days. 01/30/18 02/04/18  Calistro Rauf C, PA-C  hydrochlorothiazide (HYDRODIURIL) 12.5 MG tablet Take 12.5 mg by mouth daily. 09/07/17    [provider]  losartan (COZAAR) 50 MG tablet Take 50 mg by mouth daily. 09/07/17   [provider]  omeprazole (PRILOSEC) 20 MG capsule Take 1 capsule (20 mg total) by mouth daily for 14 days. 01/11/18 01/25/18  Wieters, Hallie C, PA-C  ondansetron (ZOFRAN ODT) 8 MG disintegrating tablet Take 1 tablet (8 mg total) by mouth every 8 (eight) hours as needed for nausea or vomiting. 01/08/18   Linwood DibblesKnapp, Jon, MD  pantoprazole (PROTONIX) 20 MG tablet Take 1 tablet (20 mg total) by mouth daily. 01/30/18 03/31/18  Lareta Bruneau C, PA-C  pravastatin (PRAVACHOL) 80 MG tablet Take 80 mg by mouth daily. 09/07/17   [provider]    Family History Family History  Problem Relation Age of Onset  . Heart disease Mother   . Hypertension Mother     Social History Social History   Tobacco Use  . Smoking status: Current Every Day Smoker    Packs/day: 1.00    Types: Cigarettes  . Smokeless tobacco: Never Used  Substance Use Topics  . Alcohol use: Not Currently    Frequency: Never  Comment: "clean x 4 yrs"  . Drug use: Never     Allergies   Lisinopril   Review of Systems Review of Systems  Constitutional: Negative for chills, diaphoresis and fever.  Respiratory: Negative for shortness of breath.   Cardiovascular: Negative for chest pain and leg swelling.  Gastrointestinal: Positive for abdominal pain and nausea. Negative for blood in stool, constipation and vomiting.  Genitourinary: Negative for dysuria, flank pain, frequency and hematuria.  Neurological: Negative for weakness.  All other systems reviewed and are negative.    Physical Exam Updated Vital Signs BP (!) 144/77 (BP Location: Right Arm)   Pulse 86   Temp 98.9 F (37.2 C) (Oral)   Resp 17   SpO2 100%   Physical Exam  Constitutional: She appears well-developed and well-nourished. No distress.  HENT:  Head: Normocephalic and atraumatic.  Eyes: Conjunctivae are normal.  Neck: Neck supple.    Cardiovascular: Normal rate, regular rhythm, normal heart sounds and intact distal pulses.  Pulmonary/Chest: Effort normal and breath sounds normal. No respiratory distress.  Abdominal: Soft. There is no tenderness. There is no guarding.  Musculoskeletal: She exhibits no edema.  Lymphadenopathy:    She has no cervical adenopathy.  Neurological: She is alert.  Skin: Skin is warm and dry. She is not diaphoretic.  Psychiatric: She has a normal mood and affect. Her behavior is normal.  Nursing note and vitals reviewed.    ED Treatments / Results  Labs (all labs ordered are listed, but only abnormal results are displayed) Labs Reviewed  COMPREHENSIVE METABOLIC PANEL - Abnormal; Notable for the following components:      Result Value   Glucose, Bld 126 (*)    All other components within normal limits  CBC - Abnormal; Notable for the following components:   WBC 11.5 (*)    All other components within normal limits  LIPASE, BLOOD  URINALYSIS, ROUTINE W REFLEX MICROSCOPIC  I-STAT TROPONIN, ED    EKG EKG Interpretation  Date/Time:  Monday January 29 2018 22:14:02 EDT Ventricular Rate:  86 PR Interval:  140 QRS Duration: 86 QT Interval:  378 QTC Calculation: 452 R Axis:   32 Text Interpretation:  Normal sinus rhythm Low voltage QRS Borderline ECG Confirmed by Ross Marcus ([id number]) on 01/30/2018 3:43:36 AM   Radiology No results found.  Procedures Procedures (including critical care time)  Medications Ordered in ED Medications  pantoprazole (PROTONIX) EC tablet 40 mg (40 mg Oral Given 01/30/18 0206)  famotidine (PEPCID) tablet 20 mg (20 mg Oral Given 01/30/18 0206)     Initial Impression / Assessment and Plan / ED Course  I have reviewed the triage vital signs and the nursing notes.  Pertinent labs & imaging results that were available during my care of the patient were reviewed by me and considered in my medical decision making (see chart for details).  Clinical  Course as of Jan 31 343  Tue Jan 30, 2018  0135 Patient not yet in the room.   [SJ]    Clinical Course User Index [SJ] Ayianna Darnold C, PA-C    Patient presents with epigastric pain that she states is consistent with her previously diagnosed GERD.  Pain-free since arrival in the ED.  Benign abdominal exam. Patient is nontoxic appearing, afebrile, not tachycardic, not tachypneic, not hypotensive, maintains excellent SPO2  on room air, and is in no apparent distress.  The patient was given instructions for home care as well as return precautions. Patient voices understanding of these instructions,  accepts the plan, and is comfortable with discharge.  Vitals:   01/29/18 1935 01/29/18 2206 01/30/18 0145 01/30/18 0200  BP: 126/80 (!) 144/77 118/71 118/67  Pulse: 84 86 90 84  Resp: 14 17 15 16   Temp:  98.9 F (37.2 C)    TempSrc:  Oral    SpO2: 96% 100% 97% 98%      Final Clinical Impressions(s) / ED Diagnoses   Final diagnoses:  Epigastric abdominal pain    ED Discharge Orders         Ordered    pantoprazole (PROTONIX) 20 MG tablet  Daily,   Status:  Discontinued     01/30/18 0212    famotidine (PEPCID) 20 MG tablet  2 times daily,   Status:  Discontinued     01/30/18 0212    famotidine (PEPCID) 20 MG tablet  2 times daily     01/30/18 0242    pantoprazole (PROTONIX) 20 MG tablet  Daily     01/30/18 0242           Anselm Pancoast, PA-C 01/30/18 0344    Shon Baton, MD 01/30/18 [phone]

## 2018-02-05 ENCOUNTER — Emergency Department (HOSPITAL_COMMUNITY)
Admission: EM | Admit: 2018-02-05 | Discharge: 2018-02-05 | Disposition: A | Discharge status: Home / Self Care | Payer: Medicare Other | Attending: Emergency Medicine | Admitting: Emergency Medicine

## 2018-02-05 ENCOUNTER — Encounter (HOSPITAL_COMMUNITY): Payer: Self-pay

## 2018-02-05 DIAGNOSIS — I10 Essential (primary) hypertension: Secondary | ICD-10-CM | POA: Insufficient documentation

## 2018-02-05 DIAGNOSIS — M25511 Pain in right shoulder: Secondary | ICD-10-CM | POA: Diagnosis not present

## 2018-02-05 DIAGNOSIS — J449 Chronic obstructive pulmonary disease, unspecified: Secondary | ICD-10-CM | POA: Insufficient documentation

## 2018-02-05 DIAGNOSIS — Z79899 Other long term (current) drug therapy: Secondary | ICD-10-CM | POA: Insufficient documentation

## 2018-02-05 DIAGNOSIS — F17210 Nicotine dependence, cigarettes, uncomplicated: Secondary | ICD-10-CM | POA: Diagnosis not present

## 2018-02-05 HISTORY — DX: Anxiety disorder, unspecified: F41.9

## 2018-02-05 MED ORDER — METHOCARBAMOL 500 MG PO TABS
500.0000 mg | ORAL_TABLET | Freq: Once | ORAL | Status: AC
  Administered 2018-02-05: 500 mg via ORAL
  Filled 2018-02-05: qty 1

## 2018-02-05 MED ORDER — METHOCARBAMOL 500 MG PO TABS: 500.0000 mg | ORAL_TABLET | Freq: Two times a day (BID) | ORAL | 0 refills | Status: DC

## 2018-02-05 NOTE — ED Notes (Signed)
Bed: [id number] Expected date:  Expected time:  Means of arrival:  Comments: 57yo F/Back pain

## 2018-02-05 NOTE — Discharge Instructions (Addendum)
1. Medications: alternate naprosyn and tylenol for pain control, Robaxin for muscle spasm, usual home medications 2. Treatment: rest, ice, drink plenty of fluids, gentle stretching 3. Follow Up: Please followup with your PCP in 1 week if no improvement for discussion of your diagnoses and further evaluation after today's visit; if you do not have a primary care doctor use the resource guide provided to find one; Please return to the ER for worsening symptoms or other concerns

## 2018-02-05 NOTE — ED Provider Notes (Signed)
[facility]-EMERGENCY DEPT Provider Note   CSN: [id number] Arrival date & time: 02/05/18  0358     History   Chief Complaint Chief Complaint  Patient presents with  . Panic Attack  . Back Pain    Upper    HPI Ashley Hodge is a 57 y.o. female with a hx of anxiety, asthma, COPD, HTN, high cholesterol,  presents to the Emergency Department complaining of gradual, persistent, progressively worsening upper back pain and right shoulder pain onset 3 hours PTA.  Pt reports the pain is severe, localized without radiation.  She reports taking ibuprofen without relief.  Movement of her shoulder makes it worse.  Patient denies falls or known trauma.  Pt denies neck pain, chest pain, shortness of breath, numbness, weakness in the arm.  Record review shows the patient has been seen several times for similar complaints in the left upper back and shoulder without acute findings.  Triage note reports that patient is having a panic attack.  When questioned about this she reports that she did have a panic attack but this has resolved.       The history is provided by the patient and medical records. No language interpreter was used.    Past Medical History:  Diagnosis Date  . Anxiety   . Asthma   . COPD (chronic obstructive pulmonary disease) (HCC)   . High cholesterol   . Hypertension     There are no active problems to display for this patient.   Past Surgical History:  Procedure Laterality Date  . TUBAL LIGATION       OB History    Gravida      Para      Term      Preterm      AB      Living  5     SAB      TAB      Ectopic      Multiple      Live Births               Home Medications    Prior to Admission medications   Medication Sig Start Date End Date Taking? Authorizing Provider  amLODipine (NORVASC) 5 MG tablet Take 5 mg by mouth daily. 09/07/17  Yes [provider]  hydrochlorothiazide (HYDRODIURIL) 12.5 MG tablet Take  12.5 mg by mouth daily. 09/07/17  Yes [provider]  ibuprofen (ADVIL,MOTRIN) 600 MG tablet Take 600 mg by mouth every 6 (six) hours as needed for headache, mild pain, moderate pain or cramping.  01/26/18  Yes [provider]  losartan (COZAAR) 50 MG tablet Take 50 mg by mouth daily. 09/07/17  Yes [provider]  pantoprazole (PROTONIX) 20 MG tablet Take 1 tablet (20 mg total) by mouth daily. 01/30/18 03/31/18 Yes Joy, Shawn C, PA-C  pravastatin (PRAVACHOL) 80 MG tablet Take 80 mg by mouth daily. 09/07/17  Yes [provider]  methocarbamol (ROBAXIN) 500 MG tablet Take 1 tablet (500 mg total) by mouth 2 (two) times daily. 02/05/18   Vegas Coffin, Dahlia Client, PA-C  ondansetron (ZOFRAN ODT) 8 MG disintegrating tablet Take 1 tablet (8 mg total) by mouth every 8 (eight) hours as needed for nausea or vomiting. Patient not taking: Reported on 02/05/2018 01/08/18   Linwood Dibbles, MD    Family History Family History  Problem Relation Age of Onset  . Heart disease Mother   . Hypertension Mother     Social History Social History  Tobacco Use  . Smoking status: Current Every Day Smoker    Packs/day: 1.00    Types: Cigarettes  . Smokeless tobacco: Never Used  Substance Use Topics  . Alcohol use: Not Currently    Frequency: Never    Comment: "clean x 4 yrs"  . Drug use: Never     Allergies   Lisinopril   Review of Systems Review of Systems  Constitutional: Negative for chills and fever.  Gastrointestinal: Negative for nausea and vomiting.  Musculoskeletal: Positive for arthralgias. Negative for back pain, joint swelling, neck pain and neck stiffness.  Skin: Negative for wound.  Neurological: Negative for numbness.  Hematological: Does not bruise/bleed easily.  Psychiatric/Behavioral: The patient is not nervous/anxious.   All other systems reviewed and are negative.    Physical Exam Updated Vital Signs BP 135/79 (BP Location: Left Arm)   Pulse 90    Temp 98.1 F (36.7 C) (Oral)   Resp 13   Ht 5' (1.524 m)   Wt 68 kg   SpO2 97%   BMI 29.29 kg/m   Physical Exam  Constitutional: She appears well-developed and well-nourished. No distress.  HENT:  Head: Normocephalic and atraumatic.  Eyes: Conjunctivae are normal.  Neck: Normal range of motion.  Cardiovascular: Normal rate, regular rhythm and intact distal pulses.  Capillary refill < 3 sec  Pulmonary/Chest: Effort normal and breath sounds normal. She has no wheezes.  Musculoskeletal: She exhibits tenderness. She exhibits no edema.  Tenderness to palpation along the right trapezius.  Palpation reproduces pain.  Full range of motion of the right shoulder without difficulty.  No midline or paraspinal tenderness of the cervical spine.  No joint line tenderness of the right shoulder.  Full range of motion of the right upper extremity.  Neurological: She is alert. Coordination normal.  Sensation tach to normal touch in the entire right upper extremity Strength 5/5 in the right upper extremity including grip strength  Skin: Skin is warm and dry. She is not diaphoretic.  No tenting of the skin No open wounds of the shoulder or arm.  Psychiatric: She has a normal mood and affect.  Nursing note and vitals reviewed.    ED Treatments / Results    Procedures Procedures (including critical care time)  Medications Ordered in ED Medications  methocarbamol (ROBAXIN) tablet 500 mg (500 mg Oral Given 02/05/18 0524)     Initial Impression / Assessment and Plan / ED Course  I have reviewed the triage vital signs and the nursing notes.  Pertinent labs & imaging results that were available during my care of the patient were reviewed by me and considered in my medical decision making (see chart for details).     Presents with right shoulder pain.  No chest pain or shortness of breath.  Pain is not worsened with exertion.  Doubt acute coronary syndrome.  Pain was nontraumatic and patient has  full range of motion of the shoulder without difficulty.  Doubt fracture or septic joint.  Tenderness to palpation along the trapezius and reproduction of pain with palpation.  Suspect muscle spasm.  Patient has already taken anti-inflammatory.  Muscle relaxer given here in the emergency room.  Patient will be discharged home with prescription for same.  Discussed reasons to return immediately to the emergency room.  Patient states understanding and is in agreement with the plan.  Final Clinical Impressions(s) / ED Diagnoses   Final diagnoses:  Acute pain of right shoulder    ED Discharge Orders  Ordered    methocarbamol (ROBAXIN) 500 MG tablet  2 times daily     02/05/18 0542           Cannie Muckle, Dahlia ClientHannah, PA-C 02/05/18 0543    Cardama, Amadeo GarnetPedro Eduardo, MD 02/06/18 [phone]

## 2018-02-05 NOTE — ED Notes (Signed)
Sandwhich and coke provided

## 2018-02-05 NOTE — ED Triage Notes (Signed)
Pt states she is having a panic attack, was walking today when she is supposed to be using a wheelchair.

## 2018-02-05 NOTE — ED Notes (Signed)
Coffee given per request.

## 2018-02-06 ENCOUNTER — Encounter (HOSPITAL_COMMUNITY): Payer: Self-pay | Admitting: Emergency Medicine

## 2018-02-06 ENCOUNTER — Emergency Department (HOSPITAL_COMMUNITY): Payer: Medicare Other

## 2018-02-06 ENCOUNTER — Emergency Department (HOSPITAL_COMMUNITY)
Admission: EM | Admit: 2018-02-06 | Discharge: 2018-02-06 | Disposition: A | Discharge status: Home / Self Care | Payer: Medicare Other | Attending: Emergency Medicine | Admitting: Emergency Medicine

## 2018-02-06 ENCOUNTER — Emergency Department (HOSPITAL_COMMUNITY)
Admission: EM | Admit: 2018-02-06 | Discharge: 2018-02-07 | Disposition: A | Discharge status: Home / Self Care | Payer: Medicare Other | Source: Home / Self Care | Attending: Emergency Medicine | Admitting: Emergency Medicine

## 2018-02-06 ENCOUNTER — Other Ambulatory Visit: Payer: Self-pay

## 2018-02-06 DIAGNOSIS — M549 Dorsalgia, unspecified: Secondary | ICD-10-CM | POA: Insufficient documentation

## 2018-02-06 DIAGNOSIS — I10 Essential (primary) hypertension: Secondary | ICD-10-CM

## 2018-02-06 DIAGNOSIS — M542 Cervicalgia: Secondary | ICD-10-CM

## 2018-02-06 DIAGNOSIS — F17210 Nicotine dependence, cigarettes, uncomplicated: Secondary | ICD-10-CM | POA: Insufficient documentation

## 2018-02-06 DIAGNOSIS — Z79899 Other long term (current) drug therapy: Secondary | ICD-10-CM | POA: Insufficient documentation

## 2018-02-06 DIAGNOSIS — K219 Gastro-esophageal reflux disease without esophagitis: Secondary | ICD-10-CM | POA: Insufficient documentation

## 2018-02-06 DIAGNOSIS — J449 Chronic obstructive pulmonary disease, unspecified: Secondary | ICD-10-CM

## 2018-02-06 DIAGNOSIS — Z5321 Procedure and treatment not carried out due to patient leaving prior to being seen by health care provider: Secondary | ICD-10-CM | POA: Diagnosis not present

## 2018-02-06 DIAGNOSIS — R079 Chest pain, unspecified: Secondary | ICD-10-CM | POA: Diagnosis present

## 2018-02-06 DIAGNOSIS — M25512 Pain in left shoulder: Secondary | ICD-10-CM | POA: Insufficient documentation

## 2018-02-06 LAB — CBC
HCT: 44 % (ref 36.0–46.0)
Hemoglobin: 14.2 g/dL (ref 12.0–15.0)
MCH: 31.1 pg (ref 26.0–34.0)
MCHC: 32.3 g/dL (ref 30.0–36.0)
MCV: 96.3 fL (ref 78.0–100.0)
PLATELETS: 332 10*3/uL (ref 150–400)
RBC: 4.57 MIL/uL (ref 3.87–5.11)
RDW: 11.8 % (ref 11.5–15.5)
WBC: 13.2 10*3/uL — AB (ref 4.0–10.5)

## 2018-02-06 LAB — BASIC METABOLIC PANEL
Anion gap: 11 (ref 5–15)
BUN: 11 mg/dL (ref 6–20)
CALCIUM: 9.5 mg/dL (ref 8.9–10.3)
CO2: 27 mmol/L (ref 22–32)
CREATININE: 0.83 mg/dL (ref 0.44–1.00)
Chloride: 102 mmol/L (ref 98–111)
GFR calc Af Amer: >60 mL/min (ref >60)
GLUCOSE: 150 mg/dL — AB (ref 70–99)
POTASSIUM: 3.8 mmol/L (ref 3.5–5.1)
SODIUM: 140 mmol/L (ref 135–145)

## 2018-02-06 LAB — I-STAT TROPONIN, ED: TROPONIN I, POC: 0 ng/mL (ref 0.00–0.08)

## 2018-02-06 LAB — I-STAT BETA HCG BLOOD, ED (MC, WL, AP ONLY): HCG, QUANTITATIVE: 8.2 m[IU]/mL — AB (ref <5)

## 2018-02-06 NOTE — ED Notes (Signed)
Pt called x's 3 without response 

## 2018-02-06 NOTE — ED Triage Notes (Signed)
[facility]. Labs, ekg, and [facility]. Patient complaining of pain in left arm and upper back pain. Patient states it started three days ago.

## 2018-02-06 NOTE — ED Triage Notes (Signed)
Pt arrives to the ED with 3 days of upper mid back pain that this morning also was in the center of her chest - describes it as a sharp pain. Pt denies any sob, pt states pain is worse with walking or moving. Pt states she took motrin and relieved the pain slightly.

## 2018-02-07 ENCOUNTER — Emergency Department (HOSPITAL_COMMUNITY): Payer: Medicare Other

## 2018-02-07 DIAGNOSIS — R079 Chest pain, unspecified: Secondary | ICD-10-CM | POA: Diagnosis not present

## 2018-02-07 MED ORDER — METHOCARBAMOL 500 MG PO TABS
500.0000 mg | ORAL_TABLET | Freq: Once | ORAL | Status: AC
  Administered 2018-02-07: 500 mg via ORAL
  Filled 2018-02-07: qty 1

## 2018-02-07 MED ORDER — TRAMADOL HCL 50 MG PO TABS: 50.0000 mg | ORAL_TABLET | Freq: Four times a day (QID) | ORAL | 0 refills | Status: DC | PRN

## 2018-02-07 MED ORDER — METHOCARBAMOL 500 MG PO TABS: 500.0000 mg | ORAL_TABLET | Freq: Every evening | ORAL | 0 refills | Status: DC | PRN

## 2018-02-07 MED ORDER — FAMOTIDINE 20 MG PO TABS
20.0000 mg | ORAL_TABLET | Freq: Once | ORAL | Status: AC
  Administered 2018-02-07: 20 mg via ORAL
  Filled 2018-02-07: qty 1

## 2018-02-07 MED ORDER — GI COCKTAIL ~~LOC~~
30.0000 mL | Freq: Once | ORAL | Status: AC
  Administered 2018-02-07: 30 mL via ORAL
  Filled 2018-02-07: qty 30

## 2018-02-07 MED ORDER — OMEPRAZOLE 20 MG PO CPDR: 20.0000 mg | DELAYED_RELEASE_CAPSULE | Freq: Every day | ORAL | 0 refills | Status: DC

## 2018-02-07 NOTE — ED Provider Notes (Signed)
[facility]-EMERGENCY DEPT Provider Note   CSN: [id number] Arrival date & time: 02/06/18  2054     History   Chief Complaint Chief Complaint  Patient presents with  . Arm Pain  . Back Pain    HPI Ashley Hodge is a 57 y.o. female with multiple ED visits who presents with upper back pain, L shoulder pain, chest pain. PMH significant for anxiety, COPD, HLD, HTN. She states that she's had upper back pain and L shoulder pain for the past three days. She has difficulty describing it stating that it just hurts. She was seen on 9/23 for this and told it was muscle spasms. She was given Robaxin and thinks it may have helped but her pain came back and she was unable to refill the medicine because of cost. Additionally she reports an acute onset of diffuse chest pain which started while I was in the room. She states it's a burning pain and goes to her throat. She states that she is burping a lot too. She was seen for similar symptoms on 9/17 and was given rx for GERD which she took but is out of this. She denies fever, chills, SOB, cough, abdominal pain, N/V/D, urinary symptoms. She went to [facility] last night for the problem again but LWBS due to wait times.  HPI  Past Medical History:  Diagnosis Date  . Anxiety   . Asthma   . COPD (chronic obstructive pulmonary disease) (HCC)   . High cholesterol   . Hypertension     There are no active problems to display for this patient.   Past Surgical History:  Procedure Laterality Date  . TUBAL LIGATION       OB History    Gravida      Para      Term      Preterm      AB      Living  5     SAB      TAB      Ectopic      Multiple      Live Births               Home Medications    Prior to Admission medications   Medication Sig Start Date End Date Taking? Authorizing Provider  amLODipine (NORVASC) 5 MG tablet Take 5 mg by mouth daily. 09/07/17  Yes [provider]  hydrochlorothiazide  (HYDRODIURIL) 12.5 MG tablet Take 12.5 mg by mouth daily. 09/07/17  Yes [provider]  ibuprofen (ADVIL,MOTRIN) 600 MG tablet Take 600 mg by mouth every 6 (six) hours as needed for headache, mild pain, moderate pain or cramping.  01/26/18  Yes [provider]  losartan (COZAAR) 50 MG tablet Take 50 mg by mouth daily. 09/07/17  Yes [provider]  ondansetron (ZOFRAN ODT) 8 MG disintegrating tablet Take 1 tablet (8 mg total) by mouth every 8 (eight) hours as needed for nausea or vomiting. 01/08/18  Yes Linwood Dibbles, MD  pantoprazole (PROTONIX) 20 MG tablet Take 1 tablet (20 mg total) by mouth daily. 01/30/18 03/31/18 Yes Joy, Shawn C, PA-C  pravastatin (PRAVACHOL) 80 MG tablet Take 80 mg by mouth daily. 09/07/17  Yes [provider]  methocarbamol (ROBAXIN) 500 MG tablet Take 1 tablet (500 mg total) by mouth 2 (two) times daily. Patient not taking: Reported on 02/06/2018 02/05/18   Muthersbaugh, Dahlia Client, PA-C    Family History Family History  Problem Relation Age of Onset  .  Heart disease Mother   . Hypertension Mother     Social History Social History   Tobacco Use  . Smoking status: Current Every Day Smoker    Packs/day: 1.00    Types: Cigarettes  . Smokeless tobacco: Never Used  Substance Use Topics  . Alcohol use: Not Currently    Frequency: Never    Comment: "clean x 4 yrs"  . Drug use: Never     Allergies   Lisinopril   Review of Systems Review of Systems  Constitutional: Negative for appetite change, chills and fever.  Respiratory: Negative for cough and shortness of breath.   Cardiovascular: Positive for chest pain.  Gastrointestinal: Negative for abdominal pain, diarrhea, nausea and vomiting.  Genitourinary: Negative for dysuria and frequency.  Musculoskeletal: Positive for arthralgias and back pain.  All other systems reviewed and are negative.    Physical Exam Updated Vital Signs BP 123/77   Pulse 77   Temp 98.5 F (36.9 C)  (Oral)   Resp 18   Ht 5' (1.524 m)   Wt 68 kg   SpO2 94%   BMI 29.30 kg/m   Physical Exam  Constitutional: She is oriented to person, place, and time. She appears well-developed and well-nourished. No distress.  Calm, cooperative. Frustrated due to wait.   HENT:  Head: Normocephalic and atraumatic.  Eyes: Pupils are equal, round, and reactive to light. Conjunctivae are normal. Right eye exhibits no discharge. Left eye exhibits no discharge. No scleral icterus.  Neck: Normal range of motion.  Tenderness over lower C-spine and left paraspinal muscles  Cardiovascular: Normal rate and regular rhythm.  Pulmonary/Chest: Effort normal and breath sounds normal. No respiratory distress. She exhibits no tenderness.  Abdominal: Soft. Bowel sounds are normal. She exhibits no distension. There is no tenderness.  Musculoskeletal:  Left shoulder: FROM without pain  Neurological: She is alert and oriented to person, place, and time.  Skin: Skin is warm and dry.  Psychiatric: She has a normal mood and affect. Her behavior is normal.  Nursing note and vitals reviewed.    ED Treatments / Results  Labs (all labs ordered are listed, but only abnormal results are displayed) Labs Reviewed - No data to display  EKG None  Radiology Dg Chest 2 View  Result Date: 02/06/2018 CLINICAL DATA:  Chest pain, upper back pain for 3 days, cough EXAM: CHEST - 2 VIEW COMPARISON:  Chest x-ray of 01/20/2018 FINDINGS: No active infiltrate or effusion is seen. Mediastinal and hilar contours are unremarkable. The heart is within normal limits in size. No acute bony abnormality is seen. IMPRESSION: No active cardiopulmonary disease. Electronically Signed   By: Dwyane Dee M.D.   On: 02/06/2018 12:16   Dg Cervical Spine Complete  Result Date: 02/07/2018 CLINICAL DATA:  Initial evaluation for acute on chronic posterior neck pain. No injury. EXAM: CERVICAL SPINE - COMPLETE 4+ VIEW COMPARISON:  None. FINDINGS: Vertebral  bodies normally aligned with preservation of the normal cervical lordosis. No listhesis or malalignment. Vertebral body heights maintained. Normal C1-2 articulations are preserved in the dens is intact. No acute fracture or malalignment. Moderate degenerative intervertebral disc space narrowing present at C4-5 through C6-7. Mild to moderate bony foraminal narrowing bilaterally at C5-6 and C6-7. No soft tissue abnormality.  Visualized lung apices are clear. IMPRESSION: 1. No radiographic evidence for acute abnormality within the cervical spine. 2. Moderate cervical spondylolysis at C4-5 through C6-7 with bilateral bony foraminal narrowing at C5-6 and C6-7. Electronically Signed   By: Sharlet Salina  Phill Myron M.D.   On: 02/07/2018 05:06    Procedures Procedures (including critical care time)  Medications Ordered in ED Medications  gi cocktail (Maalox,Lidocaine,Donnatal) (30 mLs Oral Given 02/07/18 0138)  famotidine (PEPCID) tablet 20 mg (20 mg Oral Given 02/07/18 0137)  methocarbamol (ROBAXIN) tablet 500 mg (500 mg Oral Given 02/07/18 0439)     Initial Impression / Assessment and Plan / ED Course  I have reviewed the triage vital signs and the nursing notes.  Pertinent labs & imaging results that were available during my care of the patient were reviewed by me and considered in my medical decision making (see chart for details).  57 year old female presents with upper back/L shoulder pain for the past three days. She was evaluated for this 2 days ago and had labs, CXR, EKG done last night as well although LWBS. As I entered the room to talk to the patient she started to complain of burning chest pain and throat pain. Her vitals are normal. She was given GI cocktail and Pepcid with relief of symptoms. EKG from last night was SR. CXR was negative. Labs and troponin were normal. Pain was suspected to MSK when she was seen 2 days ago but she never had the Robaxin filled. She has C-spine tenderness on exam.  Will obtain C-spine xray and reasssess.  Xray shows spondylolysis and foraminal stenosis. Pain is likely MSK. Will rx meds for pain, muscle relaxer, and reflux meds. She was encouraged to f/u with PCP.  Final Clinical Impressions(s) / ED Diagnoses   Final diagnoses:  Neck pain  Gastroesophageal reflux disease, esophagitis presence not specified    ED Discharge Orders    None       Bethel Born, PA-C 02/07/18 [id number]    Devoria Albe, MD 02/07/18 [phone]

## 2018-02-07 NOTE — Discharge Instructions (Addendum)
Take Tramadol and Robaxin for muscle pain Take Omeprazole for acid reflux once a day Please follow up with your doctor

## 2018-02-20 ENCOUNTER — Emergency Department (HOSPITAL_COMMUNITY): Payer: Medicare Other

## 2018-02-20 ENCOUNTER — Other Ambulatory Visit: Payer: Self-pay

## 2018-02-20 ENCOUNTER — Emergency Department (HOSPITAL_COMMUNITY)
Admission: EM | Admit: 2018-02-20 | Discharge: 2018-02-20 | Disposition: A | Discharge status: Home / Self Care | Payer: Medicare Other | Attending: Emergency Medicine | Admitting: Emergency Medicine

## 2018-02-20 ENCOUNTER — Encounter (HOSPITAL_COMMUNITY): Payer: Self-pay

## 2018-02-20 DIAGNOSIS — F17210 Nicotine dependence, cigarettes, uncomplicated: Secondary | ICD-10-CM | POA: Insufficient documentation

## 2018-02-20 DIAGNOSIS — J45909 Unspecified asthma, uncomplicated: Secondary | ICD-10-CM | POA: Diagnosis not present

## 2018-02-20 DIAGNOSIS — Z79899 Other long term (current) drug therapy: Secondary | ICD-10-CM | POA: Diagnosis not present

## 2018-02-20 DIAGNOSIS — R0789 Other chest pain: Secondary | ICD-10-CM | POA: Insufficient documentation

## 2018-02-20 DIAGNOSIS — I10 Essential (primary) hypertension: Secondary | ICD-10-CM | POA: Diagnosis not present

## 2018-02-20 LAB — I-STAT BETA HCG BLOOD, ED (NOT ORDERABLE): I-stat hCG, quantitative: 5.8 m[IU]/mL — ABNORMAL HIGH (ref <5)

## 2018-02-20 LAB — BASIC METABOLIC PANEL
ANION GAP: 10 (ref 5–15)
BUN: 12 mg/dL (ref 6–20)
CALCIUM: 9.6 mg/dL (ref 8.9–10.3)
CO2: 27 mmol/L (ref 22–32)
Chloride: 109 mmol/L (ref 98–111)
Creatinine, Ser: 0.79 mg/dL (ref 0.44–1.00)
GFR calc Af Amer: >60 mL/min (ref >60)
GFR calc non Af Amer: >60 mL/min (ref >60)
GLUCOSE: 167 mg/dL — AB (ref 70–99)
POTASSIUM: 3.6 mmol/L (ref 3.5–5.1)
Sodium: 146 mmol/L — ABNORMAL HIGH (ref 135–145)

## 2018-02-20 LAB — CBC
HCT: 42.5 % (ref 36.0–46.0)
Hemoglobin: 13.7 g/dL (ref 12.0–15.0)
MCH: 30.9 pg (ref 26.0–34.0)
MCHC: 32.2 g/dL (ref 30.0–36.0)
MCV: 95.7 fL (ref 80.0–100.0)
Platelets: 299 10*3/uL (ref 150–400)
RBC: 4.44 MIL/uL (ref 3.87–5.11)
RDW: 12 % (ref 11.5–15.5)
WBC: 10.3 10*3/uL (ref 4.0–10.5)
nRBC: 0 % (ref 0.0–0.2)

## 2018-02-20 LAB — POCT I-STAT TROPONIN I: Troponin i, poc: 0 ng/mL (ref 0.00–0.08)

## 2018-02-20 MED ORDER — IBUPROFEN 600 MG PO TABS: 600.0000 mg | ORAL_TABLET | Freq: Four times a day (QID) | ORAL | 0 refills | Status: DC | PRN

## 2018-02-20 NOTE — ED Triage Notes (Signed)
Pt reports L sided chest and rib cage pain.Hx of sane, She states that [place] said that she has gallstones, but [facility] keeps treating her for muscle spasms and she doesn't know what's going on. No N/V/D. No distress.

## 2018-02-20 NOTE — ED Provider Notes (Signed)
[facility]-EMERGENCY DEPT Provider Note   CSN: [id number] Arrival date & time: 02/20/18  1819     History   Chief Complaint Chief Complaint  Patient presents with  . Chest Pain    HPI Okema Rollinson is a 57 y.o. female.  57 year old female presents with acute onset of left sharp pinpoint chest pain which lasted for approximately 8 to 10 hours which resolved after she took 600 mg ibuprofen.  Pain was worse with movement and it radiates to her left chest.  She had no associated fever or chills.  No cough or congestion.  Pain was worse with movement.  It was not pleuritic.  Pain did return again and is better with remaining still.  No syncope or near syncope.  No leg pain or swelling.     Past Medical History:  Diagnosis Date  . Anxiety   . Asthma   . COPD (chronic obstructive pulmonary disease) (HCC)   . High cholesterol   . Hypertension     There are no active problems to display for this patient.   Past Surgical History:  Procedure Laterality Date  . TUBAL LIGATION       OB History    Gravida      Para      Term      Preterm      AB      Living  5     SAB      TAB      Ectopic      Multiple      Live Births               Home Medications    Prior to Admission medications   Medication Sig Start Date End Date Taking? Authorizing Provider  amLODipine (NORVASC) 5 MG tablet Take 5 mg by mouth daily. 09/07/17   [provider]  hydrochlorothiazide (HYDRODIURIL) 12.5 MG tablet Take 12.5 mg by mouth daily. 09/07/17   [provider]  ibuprofen (ADVIL,MOTRIN) 600 MG tablet Take 600 mg by mouth every 6 (six) hours as needed for headache, mild pain, moderate pain or cramping.  01/26/18   [provider]  losartan (COZAAR) 50 MG tablet Take 50 mg by mouth daily. 09/07/17   [provider]  methocarbamol (ROBAXIN) 500 MG tablet Take 1 tablet (500 mg total) by mouth at bedtime and may repeat dose one  time if needed. 02/07/18   Bethel Born, PA-C  omeprazole (PRILOSEC) 20 MG capsule Take 1 capsule (20 mg total) by mouth daily. 02/07/18   Bethel Born, PA-C  ondansetron (ZOFRAN ODT) 8 MG disintegrating tablet Take 1 tablet (8 mg total) by mouth every 8 (eight) hours as needed for nausea or vomiting. 01/08/18   Linwood Dibbles, MD  pantoprazole (PROTONIX) 20 MG tablet Take 1 tablet (20 mg total) by mouth daily. 01/30/18 03/31/18  Joy, Shawn C, PA-C  pravastatin (PRAVACHOL) 80 MG tablet Take 80 mg by mouth daily. 09/07/17   [provider]  traMADol (ULTRAM) 50 MG tablet Take 1 tablet (50 mg total) by mouth every 6 (six) hours as needed. 02/07/18   Bethel Born, PA-C    Family History Family History  Problem Relation Age of Onset  . Heart disease Mother   . Hypertension Mother     Social History Social History   Tobacco Use  . Smoking status: Current Every Day Smoker    Packs/day: 1.00    Types: Cigarettes  .  Smokeless tobacco: Never Used  Substance Use Topics  . Alcohol use: Not Currently    Frequency: Never    Comment: "clean x 4 yrs"  . Drug use: Never     Allergies   Lisinopril   Review of Systems Review of Systems  All other systems reviewed and are negative.    Physical Exam Updated Vital Signs BP 135/73 (BP Location: Left Arm)   Pulse 92   Temp 98.1 F (36.7 C) (Oral)   Resp 20   SpO2 96%   Physical Exam  Constitutional: She is oriented to person, place, and time. She appears well-developed and well-nourished.  Non-toxic appearance. No distress.  HENT:  Head: Normocephalic and atraumatic.  Eyes: Pupils are equal, round, and reactive to light. Conjunctivae, EOM and lids are normal.  Neck: Normal range of motion. Neck supple. No tracheal deviation present. No thyroid mass present.  Cardiovascular: Normal rate, regular rhythm and normal heart sounds. Exam reveals no gallop.  No murmur heard. Pulmonary/Chest: Effort normal and breath sounds  normal. No stridor. No respiratory distress. She has no decreased breath sounds. She has no wheezes. She has no rhonchi. She has no rales.    Abdominal: Soft. Normal appearance and bowel sounds are normal. She exhibits no distension. There is no tenderness. There is no rebound and no CVA tenderness.  Musculoskeletal: Normal range of motion. She exhibits no edema or tenderness.  Neurological: She is alert and oriented to person, place, and time. She has normal strength. No cranial nerve deficit or sensory deficit. GCS eye subscore is 4. GCS verbal subscore is 5. GCS motor subscore is 6.  Skin: Skin is warm and dry. No abrasion and no rash noted.  Psychiatric: She has a normal mood and affect. Her speech is normal and behavior is normal.  Nursing note and vitals reviewed.    ED Treatments / Results  Labs (all labs ordered are listed, but only abnormal results are displayed) Labs Reviewed  BASIC METABOLIC PANEL - Abnormal; Notable for the following components:      Result Value   Sodium 146 (*)    Glucose, Bld 167 (*)    All other components within normal limits  I-STAT BETA HCG BLOOD, ED (NOT ORDERABLE) - Abnormal; Notable for the following components:   I-stat hCG, quantitative 5.8 (*)    All other components within normal limits  CBC  I-STAT TROPONIN, ED  I-STAT BETA HCG BLOOD, ED ([facility], [facility], [facility] ONLY)  POCT I-STAT TROPONIN I    EKG EKG Interpretation  Date/Time:  Tuesday February 20 2018 19:12:43 EDT Ventricular Rate:  86 PR Interval:    QRS Duration: 90 QT Interval:  369 QTC Calculation: 442 R Axis:   51 Text Interpretation:  Sinus rhythm Low voltage, precordial leads No significant change since last tracing Confirmed by Lorre Nick ([id number]) on 02/20/2018 9:47:30 PM   Radiology Dg Chest 2 View  Result Date: 02/20/2018 CLINICAL DATA:  Acute LEFT chest and rib pain. EXAM: CHEST - 2 VIEW COMPARISON:  02/06/2018 and prior radiographs FINDINGS: The cardiomediastinal silhouette  is unremarkable. There is no evidence of focal airspace disease, pulmonary edema, suspicious pulmonary nodule/mass, pleural effusion, or pneumothorax. No acute bony abnormalities are identified. IMPRESSION: No active cardiopulmonary disease. Electronically Signed   By: Harmon Pier M.D.   On: 02/20/2018 19:49    Procedures Procedures (including critical care time)  Medications Ordered in ED Medications - No data to display   Initial Impression / Assessment and Plan /  ED Course  I have reviewed the triage vital signs and the nursing notes.  Pertinent labs & imaging results that were available during my care of the patient were reviewed by me and considered in my medical decision making (see chart for details).    Patient here with chest wall pain.  Chest x-ray and troponin negative.  Low suspicion for ACS and patient stable for discharge  Final Clinical Impressions(s) / ED Diagnoses   Final diagnoses:  None    ED Discharge Orders    None       Lorre Nick, MD 02/20/18 2156

## 2018-02-20 NOTE — ED Notes (Signed)
Pt concerned about catching the bus at 2230 and didn't want to get out of her wheelchair to get in the bed

## 2018-02-21 ENCOUNTER — Emergency Department (HOSPITAL_COMMUNITY)
Admission: EM | Admit: 2018-02-21 | Discharge: 2018-02-21 | Discharge status: Against Medical Advice | Payer: Medicare Other | Attending: Emergency Medicine | Admitting: Emergency Medicine

## 2018-02-21 ENCOUNTER — Other Ambulatory Visit: Payer: Self-pay

## 2018-02-21 ENCOUNTER — Encounter (HOSPITAL_COMMUNITY): Payer: Self-pay | Admitting: Emergency Medicine

## 2018-02-21 DIAGNOSIS — R0789 Other chest pain: Secondary | ICD-10-CM | POA: Insufficient documentation

## 2018-02-21 DIAGNOSIS — I10 Essential (primary) hypertension: Secondary | ICD-10-CM | POA: Diagnosis not present

## 2018-02-21 DIAGNOSIS — Z5321 Procedure and treatment not carried out due to patient leaving prior to being seen by health care provider: Secondary | ICD-10-CM

## 2018-02-21 DIAGNOSIS — J449 Chronic obstructive pulmonary disease, unspecified: Secondary | ICD-10-CM | POA: Insufficient documentation

## 2018-02-21 LAB — BASIC METABOLIC PANEL
Anion gap: 10 (ref 5–15)
BUN: 9 mg/dL (ref 6–20)
CHLORIDE: 103 mmol/L (ref 98–111)
CO2: 26 mmol/L (ref 22–32)
CREATININE: 0.84 mg/dL (ref 0.44–1.00)
Calcium: 9.5 mg/dL (ref 8.9–10.3)
GFR calc Af Amer: >60 mL/min (ref >60)
GFR calc non Af Amer: >60 mL/min (ref >60)
Glucose, Bld: 100 mg/dL — ABNORMAL HIGH (ref 70–99)
Potassium: 4 mmol/L (ref 3.5–5.1)
SODIUM: 139 mmol/L (ref 135–145)

## 2018-02-21 LAB — CBC
HEMATOCRIT: 42.3 % (ref 36.0–46.0)
Hemoglobin: 13.2 g/dL (ref 12.0–15.0)
MCH: 29.7 pg (ref 26.0–34.0)
MCHC: 31.2 g/dL (ref 30.0–36.0)
MCV: 95.1 fL (ref 80.0–100.0)
Platelets: 314 10*3/uL (ref 150–400)
RBC: 4.45 MIL/uL (ref 3.87–5.11)
RDW: 11.9 % (ref 11.5–15.5)
WBC: 10.3 10*3/uL (ref 4.0–10.5)
nRBC: 0 % (ref 0.0–0.2)

## 2018-02-21 LAB — I-STAT TROPONIN, ED: Troponin i, poc: 0 ng/mL (ref 0.00–0.08)

## 2018-02-21 NOTE — ED Triage Notes (Signed)
Pt reports an intermittent sharp left breast and left rib cage pain that started yesterday (was more constant). Pain upon inspiration. Pt doesn't know if she did anything to pull a muscle, denies injuries. Reports it's not a chest pain. Denies sob or dizziness. Reports some nausea and upper back pain also. Hx of smoking 1ppd/5 years.

## 2018-02-21 NOTE — ED Notes (Signed)
Reply for vitals x4 30 minutes apart

## 2018-02-21 NOTE — ED Notes (Signed)
No reply for vitals 

## 2018-02-21 NOTE — ED Provider Notes (Signed)
MSE was initiated and I personally evaluated the patient and placed orders (if any) at  3:45 PM on February 21, 2018.  The patient appears stable so that the remainder of the MSE may be completed by another provider.  Patient placed in Quick Look pathway, seen and evaluated   Chief Complaint: Left chest pain  HPI:   57 year old female with past medical history of COPD, hypertension, anxiety presents to ED for evaluation of left-sided chest pain which now radiates to her back.  She reports associated nausea.  She was seen and evaluated at [facility] long ED about 24 hours ago with similar symptoms.  States that she had improvement with the ibuprofen yesterday but not today.  She is unsure if she pulled a muscle.  Patient states that she is not having any chest pain and states that "I have a perfect heart, I know it ain't my heart."  She denies any shortness of breath, vomiting, prior MI, PE or DVT.  ROS: Chest pain (one)  Physical Exam:   Gen: No distress  Neuro: Awake and Alert  Skin: Warm    Focused Exam: Tenderness palpation of the left upper/middle chest.  Tenderness palpation of the left side of the back. Lungs CTAB. RRR. Patient comfortably playing candy crush on her phone in NAD.   Initiation of care has begun. The patient has been counseled on the process, plan, and necessity for staying for the completion/evaluation, and the remainder of the medical screening examination    Dietrich Pates, PA-C 02/21/18 1547    Melene Plan, DO 02/27/18 1539

## 2018-02-24 ENCOUNTER — Emergency Department (HOSPITAL_COMMUNITY): Payer: Medicare Other

## 2018-02-24 ENCOUNTER — Encounter (HOSPITAL_COMMUNITY): Payer: Self-pay | Admitting: Emergency Medicine

## 2018-02-24 ENCOUNTER — Emergency Department (HOSPITAL_COMMUNITY)
Admission: EM | Admit: 2018-02-24 | Discharge: 2018-02-24 | Disposition: A | Discharge status: Home / Self Care | Payer: Medicare Other | Attending: Emergency Medicine | Admitting: Emergency Medicine

## 2018-02-24 DIAGNOSIS — Z79899 Other long term (current) drug therapy: Secondary | ICD-10-CM | POA: Diagnosis not present

## 2018-02-24 DIAGNOSIS — Z7902 Long term (current) use of antithrombotics/antiplatelets: Secondary | ICD-10-CM | POA: Diagnosis not present

## 2018-02-24 DIAGNOSIS — R1011 Right upper quadrant pain: Secondary | ICD-10-CM | POA: Insufficient documentation

## 2018-02-24 DIAGNOSIS — F17210 Nicotine dependence, cigarettes, uncomplicated: Secondary | ICD-10-CM | POA: Diagnosis not present

## 2018-02-24 DIAGNOSIS — R1032 Left lower quadrant pain: Secondary | ICD-10-CM | POA: Diagnosis present

## 2018-02-24 DIAGNOSIS — J449 Chronic obstructive pulmonary disease, unspecified: Secondary | ICD-10-CM | POA: Insufficient documentation

## 2018-02-24 DIAGNOSIS — I10 Essential (primary) hypertension: Secondary | ICD-10-CM | POA: Diagnosis not present

## 2018-02-24 DIAGNOSIS — R110 Nausea: Secondary | ICD-10-CM | POA: Insufficient documentation

## 2018-02-24 LAB — COMPREHENSIVE METABOLIC PANEL
ALK PHOS: 100 U/L (ref 38–126)
ALT: 276 U/L — ABNORMAL HIGH (ref 0–44)
ANION GAP: 9 (ref 5–15)
AST: 147 U/L — ABNORMAL HIGH (ref 15–41)
Albumin: 3.9 g/dL (ref 3.5–5.0)
BUN: 10 mg/dL (ref 6–20)
CALCIUM: 9.1 mg/dL (ref 8.9–10.3)
CHLORIDE: 105 mmol/L (ref 98–111)
CO2: 24 mmol/L (ref 22–32)
Creatinine, Ser: 0.78 mg/dL (ref 0.44–1.00)
GFR calc Af Amer: >60 mL/min (ref >60)
GFR calc non Af Amer: >60 mL/min (ref >60)
GLUCOSE: 140 mg/dL — AB (ref 70–99)
Potassium: 3.9 mmol/L (ref 3.5–5.1)
Sodium: 138 mmol/L (ref 135–145)
Total Bilirubin: 0.5 mg/dL (ref 0.3–1.2)
Total Protein: 7.4 g/dL (ref 6.5–8.1)

## 2018-02-24 LAB — URINALYSIS, ROUTINE W REFLEX MICROSCOPIC
BILIRUBIN URINE: NEGATIVE
Glucose, UA: NEGATIVE mg/dL
Hgb urine dipstick: NEGATIVE
KETONES UR: NEGATIVE mg/dL
Nitrite: NEGATIVE
PROTEIN: NEGATIVE mg/dL
Specific Gravity, Urine: 1.012 (ref 1.005–1.030)
pH: 6 (ref 5.0–8.0)

## 2018-02-24 LAB — CBC
HEMATOCRIT: 44.5 % (ref 36.0–46.0)
Hemoglobin: 14.2 g/dL (ref 12.0–15.0)
MCH: 30.3 pg (ref 26.0–34.0)
MCHC: 31.9 g/dL (ref 30.0–36.0)
MCV: 95.1 fL (ref 80.0–100.0)
PLATELETS: 346 10*3/uL (ref 150–400)
RBC: 4.68 MIL/uL (ref 3.87–5.11)
RDW: 11.9 % (ref 11.5–15.5)
WBC: 13.3 10*3/uL — ABNORMAL HIGH (ref 4.0–10.5)
nRBC: 0 % (ref 0.0–0.2)

## 2018-02-24 LAB — LIPASE, BLOOD: Lipase: 35 U/L (ref 11–51)

## 2018-02-24 MED ORDER — HYDROMORPHONE HCL 1 MG/ML IJ SOLN
1.0000 mg | Freq: Once | INTRAMUSCULAR | Status: AC
  Administered 2018-02-24: 1 mg via INTRAVENOUS
  Filled 2018-02-24: qty 1

## 2018-02-24 MED ORDER — ONDANSETRON HCL 4 MG/2ML IJ SOLN
4.0000 mg | Freq: Once | INTRAMUSCULAR | Status: AC
  Administered 2018-02-24: 4 mg via INTRAVENOUS
  Filled 2018-02-24: qty 2

## 2018-02-24 MED ORDER — IOHEXOL 300 MG/ML  SOLN
100.0000 mL | Freq: Once | INTRAMUSCULAR | Status: AC | PRN
  Administered 2018-02-24: 100 mL via INTRAVENOUS

## 2018-02-24 MED ORDER — IOPAMIDOL (ISOVUE-300) INJECTION 61%: 100.0000 mL | Freq: Once | INTRAVENOUS | Status: DC | PRN

## 2018-02-24 MED ORDER — ONDANSETRON HCL 4 MG PO TABS: 4.0000 mg | ORAL_TABLET | Freq: Four times a day (QID) | ORAL | 0 refills | Status: AC

## 2018-02-24 MED ORDER — SODIUM CHLORIDE 0.9 % IV BOLUS
1000.0000 mL | Freq: Once | INTRAVENOUS | Status: AC
  Administered 2018-02-24: 1000 mL via INTRAVENOUS

## 2018-02-24 NOTE — ED Provider Notes (Signed)
[facility] EMERGENCY DEPARTMENT Provider Note   CSN: [id number] Arrival date & time: 02/24/18  1127     History   Chief Complaint Chief Complaint  Patient presents with  . Abdominal Pain    HPI Ashley Hodge is a 57 y.o. female.  57 y.o female with a PMH of Anxiety, COPD, HTN, high cholesterol presents to the ED with a chief complaint of LLQ abdominal pain x 4 hours. Patient describes the pain as constant sharp around the left lower quadrant radiating from her right flank.  Reports the pain is worse with laying on her back but better with laying on her left side.  She has not tried any therapy for pain relief.  She also reports some nausea.  She reports when she was seeing at the hospital a couple of days ago she was told she had some gallstones denies any right upper quadrant pain, surgical history aside from a tubal ligation. She denies any diarrhea, vomiting, fever, urinary symptoms, chest pain or shortness of breath.     Past Medical History:  Diagnosis Date  . Anxiety   . Asthma   . COPD (chronic obstructive pulmonary disease) (HCC)   . High cholesterol   . Hypertension     There are no active problems to display for this patient.   Past Surgical History:  Procedure Laterality Date  . TUBAL LIGATION       OB History    Gravida      Para      Term      Preterm      AB      Living  5     SAB      TAB      Ectopic      Multiple      Live Births               Home Medications    Prior to Admission medications   Medication Sig Start Date End Date Taking? Authorizing Provider  amLODipine (NORVASC) 5 MG tablet Take 5 mg by mouth daily. 09/07/17  Yes [provider]  hydrochlorothiazide (HYDRODIURIL) 12.5 MG tablet Take 12.5 mg by mouth daily. 09/07/17  Yes [provider]  ibuprofen (ADVIL,MOTRIN) 600 MG tablet Take 600 mg by mouth every 6 (six) hours as needed for headache, mild pain, moderate pain or  cramping.  01/26/18  Yes [provider]  losartan (COZAAR) 50 MG tablet Take 50 mg by mouth daily. 09/07/17  Yes [provider]  methocarbamol (ROBAXIN) 500 MG tablet Take 1 tablet (500 mg total) by mouth at bedtime and may repeat dose one time if needed. 02/07/18  Yes Bethel Born, PA-C  omeprazole (PRILOSEC) 20 MG capsule Take 1 capsule (20 mg total) by mouth daily. 02/07/18  Yes Bethel Born, PA-C  pravastatin (PRAVACHOL) 80 MG tablet Take 80 mg by mouth daily. 09/07/17  Yes [provider]  ibuprofen (ADVIL,MOTRIN) 600 MG tablet Take 1 tablet (600 mg total) by mouth every 6 (six) hours as needed. Patient not taking: Reported on 02/24/2018 02/20/18   Lorre Nick, MD  ondansetron (ZOFRAN ODT) 8 MG disintegrating tablet Take 1 tablet (8 mg total) by mouth every 8 (eight) hours as needed for nausea or vomiting. Patient not taking: Reported on 02/24/2018 01/08/18   Linwood Dibbles, MD  ondansetron (ZOFRAN) 4 MG tablet Take 1 tablet (4 mg total) by mouth every 6 (six) hours for 5 days. 02/24/18 03/01/18  Claude Manges, PA-C  pantoprazole (PROTONIX) 20 MG tablet Take 1 tablet (20 mg total) by mouth daily. Patient not taking: Reported on 02/24/2018 01/30/18 03/31/18  Joy, Hillard Danker, PA-C  traMADol (ULTRAM) 50 MG tablet Take 1 tablet (50 mg total) by mouth every 6 (six) hours as needed. Patient not taking: Reported on 02/24/2018 02/07/18   Bethel Born, PA-C    Family History Family History  Problem Relation Age of Onset  . Heart disease Mother   . Hypertension Mother     Social History Social History   Tobacco Use  . Smoking status: Current Every Day Smoker    Packs/day: 1.00    Types: Cigarettes  . Smokeless tobacco: Never Used  Substance Use Topics  . Alcohol use: Not Currently    Frequency: Never    Comment: "clean x 4 yrs"  . Drug use: Never     Allergies   Lisinopril   Review of Systems Review of Systems  Constitutional: Negative for  chills and fever.  HENT: Negative for sore throat.   Respiratory: Negative for shortness of breath.   Cardiovascular: Negative for chest pain.  Gastrointestinal: Positive for abdominal pain and nausea. Negative for vomiting.  Genitourinary: Positive for flank pain. Negative for dysuria and hematuria.  Musculoskeletal: Negative for back pain.  Skin: Negative for pallor and wound.  Neurological: Negative for light-headedness and headaches.  All other systems reviewed and are negative.    Physical Exam Updated Vital Signs BP 105/61 (BP Location: Right Arm)   Pulse 67   Temp 98.2 F (36.8 C) (Oral)   Resp 18   SpO2 98%   Physical Exam  Constitutional: She is oriented to person, place, and time. She appears well-developed and well-nourished.  HENT:  Head: Normocephalic and atraumatic.  Cardiovascular: Normal rate.  Pulmonary/Chest: Effort normal. She has decreased breath sounds. She has no wheezes.  diminished breath sounds throughout, borderline COPD per patient.   Abdominal: Soft. Normal appearance and bowel sounds are normal. There is tenderness in the left lower quadrant. There is no rebound, no CVA tenderness and no tenderness at McBurney's point.  Neurological: She is alert and oriented to person, place, and time.  Skin: Skin is warm and dry.  Nursing note and vitals reviewed.    ED Treatments / Results  Labs (all labs ordered are listed, but only abnormal results are displayed) Labs Reviewed  COMPREHENSIVE METABOLIC PANEL - Abnormal; Notable for the following components:      Result Value   Glucose, Bld 140 (*)    AST 147 (*)    ALT 276 (*)    All other components within normal limits  CBC - Abnormal; Notable for the following components:   WBC 13.3 (*)    All other components within normal limits  URINALYSIS, ROUTINE W REFLEX MICROSCOPIC - Abnormal; Notable for the following components:   APPearance HAZY (*)    Leukocytes, UA LARGE (*)    Bacteria, UA RARE (*)      All other components within normal limits  URINE CULTURE  LIPASE, BLOOD    EKG None  Radiology Ct Abdomen Pelvis W Contrast  Result Date: 02/24/2018 CLINICAL DATA:  LEFT lower quadrant abdominal pain, nonradiating, for 1 day. Diverticulitis suspected. EXAM: CT ABDOMEN AND PELVIS WITH CONTRAST TECHNIQUE: Multidetector CT imaging of the abdomen and pelvis was performed using the standard protocol following bolus administration of intravenous contrast. CONTRAST:  OMNIPAQUE IOHEXOL 300 MG/ML  SOLN COMPARISON:  None. FINDINGS: Lower  chest: No acute abnormality. Hepatobiliary: No focal liver abnormality is seen. No gallstones, gallbladder wall thickening, or biliary dilatation. Pancreas: Unremarkable. No pancreatic ductal dilatation or surrounding inflammatory changes. Spleen: Normal in size without focal abnormality. Adrenals/Urinary Tract: Adrenal glands are unremarkable. Kidneys appear normal without mass, stone or hydronephrosis. No perinephric fluid. No ureteral or bladder calculi identified. Bladder appears normal, partially decompressed. Stomach/Bowel: No dilated large or small bowel loops. No evidence of bowel wall inflammation. No diverticulosis seen. Appendix is normal. Stomach appears normal, partially decompressed. Vascular/Lymphatic: Aortic atherosclerosis. No enlarged abdominal or pelvic lymph nodes. Reproductive: Uterus and bilateral adnexa are unremarkable. Other: No free fluid or abscess collection. No free intraperitoneal air. Musculoskeletal: No acute or suspicious osseous finding. IMPRESSION: 1. No acute findings within the abdomen or pelvis. No bowel obstruction or evidence of bowel wall inflammation. No diverticulitis. No evidence of acute solid organ abnormality. No renal or ureteral calculi. Appendix is normal. 2.  Aortic Atherosclerosis (ICD10-I70.0). Electronically Signed   By: Bary Richard M.D.   On: 02/24/2018 15:41   US Abdomen Limited Ruq  Result Date:  02/24/2018 CLINICAL DATA:  Right upper abdominal pain EXAM: ULTRASOUND ABDOMEN LIMITED RIGHT UPPER QUADRANT COMPARISON:  None. FINDINGS: Gallbladder: No gallstones or wall thickening visualized. Incompletely distended. 4 mm nonshadowing focus adjacent to the wall, with other areas suggestive of subtle ring-down artifact. No sonographic Murphy sign noted by sonographer. Common bile duct: Diameter: 2 mm, unremarkable Liver: No focal lesion identified. Within normal limits in parenchymal echogenicity. Portal vein is patent on color Doppler imaging with normal direction of blood flow towards the liver. IMPRESSION: 1. Negative for cholelithiasis or other ultrasound evidence of cholecystitis. 2. Gallbladder wall changes suggesting benign hyperplastic cholecystosis. Electronically Signed   By: Corlis Leak M.D.   On: 02/24/2018 13:57    Procedures Procedures (including critical care time)  Medications Ordered in ED Medications  iopamidol (ISOVUE-300) 61 % injection 100 mL (has no administration in time range)  ondansetron (ZOFRAN) injection 4 mg (4 mg Intravenous Given 02/24/18 1413)  sodium chloride 0.9 % bolus 1,000 mL (0 mLs Intravenous Stopped 02/24/18 1459)  HYDROmorphone (DILAUDID) injection 1 mg (1 mg Intravenous Given 02/24/18 1410)  iohexol (OMNIPAQUE) 300 MG/ML solution 100 mL (100 mLs Intravenous Contrast Given 02/24/18 1503)     Initial Impression / Assessment and Plan / ED Course  I have reviewed the triage vital signs and the nursing notes.  Pertinent labs & imaging results that were available during my care of the patient were reviewed by me and considered in my medical decision making (see chart for details).    Resents with left lower quadrant pain which began this morning she reports it as sharp and stabbing nature.  Upon examination she exhibits some tenderness on the right upper quadrant order an ultrasound right upper quadrant to rule out any gallbladder issues as patient was  previously told that she had some gallstones.  Ultrasound right upper quadrant is negative for gallstones or acute cholecystitis.  CT was ordered to rule out any kidney stones, diverticulitis.  CT showed no acute abnormality, within normal limits.   [facility] showed slight elevation of leukocytosis, CMP was within normal limits no electrolyte abnormality however it did show elevation of her AST and ALT 147-276 compared to her previous levels.  Will order right upper quadrant ultrasound to rule out any liver infarction.  Ultrasound was negative.  Urinalysis showed large leukocytosis, bacteria but patient is not having any urinary symptoms at this time  no nitrates, no dysuria.  Lipase was within normal limits at 35.  Patient is feeling better after receiving some pain medication she requests this to eat.  I have advised patient to follow-up with her PCP on Monday in order to have them recheck her liver enzymes, she is requesting some nausea medication we will send her home with some Zofran.  Patient's vitals stable for discharge, patient stable for discharge.   Final Clinical Impressions(s) / ED Diagnoses   Final diagnoses:  RUQ pain  Nausea  Left lower quadrant abdominal pain    ED Discharge Orders         Ordered    ondansetron (ZOFRAN) 4 MG tablet  Every 6 hours     02/24/18 1553           Claude Manges, PA-C 02/24/18 1556    Azalia Bilis, MD 02/25/18 1747

## 2018-02-24 NOTE — Discharge Instructions (Addendum)
Please follow up with your Primary Care Physician for a recheck on your Liver Enzymes. I have provided a prescription for nausea please take as directed. If you experience any fever, chest pain or worsening symptoms please return to the ED for reevaluation.

## 2018-02-24 NOTE — ED Triage Notes (Addendum)
Pt reports LLQ abdominal pain, non radiating for 1 day. She is crying during triage, HR 113. No vomiting or diarrhea. Last BM yesterday, it was normal. No urinary problems. Pt seen 9th for left breast pain and rib pain.

## 2018-02-25 LAB — URINE CULTURE: Culture: <10000 — AB

## 2018-02-28 ENCOUNTER — Other Ambulatory Visit: Payer: Self-pay

## 2018-02-28 ENCOUNTER — Emergency Department (HOSPITAL_COMMUNITY)
Admission: EM | Admit: 2018-02-28 | Discharge: 2018-02-28 | Disposition: A | Discharge status: Home / Self Care | Payer: Medicare Other | Attending: Emergency Medicine | Admitting: Emergency Medicine

## 2018-02-28 ENCOUNTER — Encounter (HOSPITAL_COMMUNITY): Payer: Self-pay | Admitting: Emergency Medicine

## 2018-02-28 ENCOUNTER — Emergency Department (HOSPITAL_COMMUNITY): Payer: Medicare Other

## 2018-02-28 DIAGNOSIS — M25512 Pain in left shoulder: Secondary | ICD-10-CM | POA: Diagnosis present

## 2018-02-28 DIAGNOSIS — F17210 Nicotine dependence, cigarettes, uncomplicated: Secondary | ICD-10-CM | POA: Diagnosis not present

## 2018-02-28 DIAGNOSIS — M549 Dorsalgia, unspecified: Secondary | ICD-10-CM | POA: Diagnosis not present

## 2018-02-28 DIAGNOSIS — I10 Essential (primary) hypertension: Secondary | ICD-10-CM | POA: Insufficient documentation

## 2018-02-28 DIAGNOSIS — Z79899 Other long term (current) drug therapy: Secondary | ICD-10-CM | POA: Insufficient documentation

## 2018-02-28 DIAGNOSIS — J449 Chronic obstructive pulmonary disease, unspecified: Secondary | ICD-10-CM | POA: Diagnosis not present

## 2018-02-28 LAB — I-STAT TROPONIN, ED: TROPONIN I, POC: 0 ng/mL (ref 0.00–0.08)

## 2018-02-28 MED ORDER — LIDOCAINE 5 % EX PTCH
1.0000 | MEDICATED_PATCH | CUTANEOUS | Status: DC
  Filled 2018-02-28: qty 1

## 2018-02-28 NOTE — ED Triage Notes (Signed)
Patient c/o upper back and neck pain since waking up this morning. Reports pain worsens with movement.

## 2018-02-28 NOTE — ED Provider Notes (Signed)
[facility]-EMERGENCY DEPT Provider Note   CSN: [id number] Arrival date & time: 02/28/18  1620     History   Chief Complaint Chief Complaint  Patient presents with  . Back Pain  . Arm Pain    HPI Hally Colella is a 57 y.o. female who is well-known to this emergency department.  She has 28 visits to the ER in the last 6 months.  She is a past medical history significant for hypertension, hyperlipidemia, asthma, COPD, difficulty ambulating and anxiety.  Patient is here for evaluation of pain in her left shoulder blade region.  She states that she thought she may have slept funny she woke up with pain, radiating into the left shoulder region.  Is worse when she tries to move her lies back against the muscular tissue in her shoulder girdle region.  Patient states that yesterday she also had an episode where she had pain radiating into her jaw and down her left arm.  She was seen by EMS and had 3- EKGs declined transport at that time.  She says that this morning she had onset of the same kind of pain however it was across her shoulder blades and down both of her arms.  She did not have any chest pain at that time she did not have jaw pain.  She denies shortness of breath or diaphoresis.  She is a chronic daily heavy smoker.  She states that she has been afraid to take any over-the-counter pain medication such as Tylenol or Motrin because she had recent elevated liver enzyme and is waiting to get to see a hepatologist.  She has a history of hypertension which is well-controlled on medications.    HPI  Past Medical History:  Diagnosis Date  . Anxiety   . Asthma   . COPD (chronic obstructive pulmonary disease) (HCC)   . High cholesterol   . Hypertension     There are no active problems to display for this patient.   Past Surgical History:  Procedure Laterality Date  . TUBAL LIGATION       OB History    Gravida      Para      Term      Preterm      AB      Living  5     SAB      TAB      Ectopic      Multiple      Live Births               Home Medications    Prior to Admission medications   Medication Sig Start Date End Date Taking? Authorizing Provider  amLODipine (NORVASC) 5 MG tablet Take 5 mg by mouth daily. 09/07/17   [provider]  hydrochlorothiazide (HYDRODIURIL) 12.5 MG tablet Take 12.5 mg by mouth daily. 09/07/17   [provider]  ibuprofen (ADVIL,MOTRIN) 600 MG tablet Take 600 mg by mouth every 6 (six) hours as needed for headache, mild pain, moderate pain or cramping.  01/26/18   [provider]  ibuprofen (ADVIL,MOTRIN) 600 MG tablet Take 1 tablet (600 mg total) by mouth every 6 (six) hours as needed. Patient not taking: Reported on 02/24/2018 02/20/18   Lorre Nick, MD  losartan (COZAAR) 50 MG tablet Take 50 mg by mouth daily. 09/07/17   [provider]  methocarbamol (ROBAXIN) 500 MG tablet Take 1 tablet (500 mg total) by mouth at bedtime and may repeat dose  one time if needed. 02/07/18   Bethel Born, PA-C  omeprazole (PRILOSEC) 20 MG capsule Take 1 capsule (20 mg total) by mouth daily. 02/07/18   Bethel Born, PA-C  ondansetron (ZOFRAN ODT) 8 MG disintegrating tablet Take 1 tablet (8 mg total) by mouth every 8 (eight) hours as needed for nausea or vomiting. Patient not taking: Reported on 02/24/2018 01/08/18   Linwood Dibbles, MD  ondansetron (ZOFRAN) 4 MG tablet Take 1 tablet (4 mg total) by mouth every 6 (six) hours for 5 days. 02/24/18 03/01/18  Claude Manges, PA-C  pantoprazole (PROTONIX) 20 MG tablet Take 1 tablet (20 mg total) by mouth daily. Patient not taking: Reported on 02/24/2018 01/30/18 03/31/18  Joy, Hillard Danker, PA-C  pravastatin (PRAVACHOL) 80 MG tablet Take 80 mg by mouth daily. 09/07/17   [provider]  traMADol (ULTRAM) 50 MG tablet Take 1 tablet (50 mg total) by mouth every 6 (six) hours as needed. Patient not taking: Reported on 02/24/2018 02/07/18    Bethel Born, PA-C    Family History Family History  Problem Relation Age of Onset  . Heart disease Mother   . Hypertension Mother     Social History Social History   Tobacco Use  . Smoking status: Current Every Day Smoker    Packs/day: 1.00    Types: Cigarettes  . Smokeless tobacco: Never Used  Substance Use Topics  . Alcohol use: Not Currently    Frequency: Never    Comment: "clean x 4 yrs"  . Drug use: Never     Allergies   Lisinopril   Review of Systems Review of Systems  Ten systems reviewed and are negative for acute change, except as noted in the HPI.   Physical Exam Updated Vital Signs BP 122/77 (BP Location: Left Arm)   Pulse 96   Temp 98.5 F (36.9 C) (Oral)   Resp 20   SpO2 95%   Physical Exam  Constitutional: She is oriented to person, place, and time. She appears well-developed and well-nourished. No distress.  HENT:  Head: Normocephalic and atraumatic.  Eyes: Conjunctivae are normal. No scleral icterus.  Neck: Normal range of motion.  Cardiovascular: Normal rate, regular rhythm and normal heart sounds. Exam reveals no gallop and no friction rub.  No murmur heard. Pulmonary/Chest: Effort normal and breath sounds normal. No respiratory distress.  Abdominal: Soft. Bowel sounds are normal. She exhibits no distension and no mass. There is no tenderness. There is no guarding.  Musculoskeletal:       Back:  Neurological: She is alert and oriented to person, place, and time.  Skin: Skin is warm and dry. She is not diaphoretic.  Psychiatric: Her behavior is normal.  Nursing note and vitals reviewed.    ED Treatments / Results  Labs (all labs ordered are listed, but only abnormal results are displayed) Labs Reviewed  I-STAT TROPONIN, ED    EKG None  Radiology No results found.  Procedures Procedures (including critical care time)  Medications Ordered in ED Medications  lidocaine (LIDODERM) 5 % 1 patch (has no administration  in time range)     Initial Impression / Assessment and Plan / ED Course  I have reviewed the triage vital signs and the nursing notes.  Pertinent labs & imaging results that were available during my care of the patient were reviewed by me and considered in my medical decision making (see chart for details).  Clinical Course as of Feb 28 2209  Wed Feb 28, 2018  2012 ECG interpretation  Rate: 87  Rhythm: normal sinus rhythm  QRS Axis: normal  Intervals: normal  ST/T Wave abnormalities: normal  Conduction Disutrbances: none  Narrative Interpretation:   Old EKG Reviewed: No significant changes noted      [AH]    Clinical Course User Index [AH] Arthor Captain, PA-C    Patient here with left back and shoulder pain.  She is been seen multiple times for the same complaint.  This appears to be trigger point in the lower trapezius fibers.  Pain is easily reproducible with palpation.  She has had multiple work-ups for cardiac complaints in the past.  Her symptoms however somewhat concerning yesterday and she has several risk factors for ACS.  Get an EKG and chest x-ray as well as a troponin to look for any suggestion of acute coronary syndrome.  She is no active chest pain, jaw pain at this time. \  With negative troponin, chest x-ray is unremarkable.  I personally reviewed the images and agree with radiologic interpretation.  EKG is normal and without any ischemic abnormalities.  Patient declined lidocaine patch.  No concern for ACS as the cause of her jaw pain and arm pain last night or the pain she is currently feeling.    Patient appears appropriate for discharge at this time Final Clinical Impressions(s) / ED Diagnoses   Final diagnoses:  Trigger point of shoulder region, left    ED Discharge Orders    None       Arthor Captain, PA-C 02/28/18 2211    Maia Plan, MD 03/01/18 [phone]

## 2018-02-28 NOTE — Discharge Instructions (Addendum)
Follow up with your primary care doctor for referral to physical therapy for evaluation and treatment of trigger points in the region between your spine and scapula on the left. You may use lidocaine 4% patches which are available over the counter. Place one on the shoulder region 12 hours on and 12 hours off.  Your caregiver has diagnosed you as having chest pain that is not specific for one problem, but does not require admission.  You are at low risk for an acute heart condition or other serious illness. Chest pain comes from many different causes.  SEEK IMMEDIATE MEDICAL ATTENTION IF: You have severe chest pain, especially if the pain is crushing or pressure-like and spreads to the arms, back, neck, or jaw, or if you have sweating, nausea (feeling sick to your stomach), or shortness of breath. THIS IS AN EMERGENCY. Don't wait to see if the pain will go away. Get medical help at once. Call 911 or 0 (operator). DO NOT drive yourself to the hospital.  Your chest pain gets worse and does not go away with rest.  You have an attack of chest pain lasting longer than usual, despite rest and treatment with the medications your caregiver has prescribed.  You wake from sleep with chest pain or shortness of breath.  You feel dizzy or faint.  You have chest pain not typical of your usual pain for which you originally saw your caregiver.

## 2018-03-02 ENCOUNTER — Other Ambulatory Visit (INDEPENDENT_AMBULATORY_CARE_PROVIDER_SITE_OTHER): Payer: Medicare Other

## 2018-03-02 ENCOUNTER — Ambulatory Visit (INDEPENDENT_AMBULATORY_CARE_PROVIDER_SITE_OTHER): Payer: Medicare Other | Admitting: Gastroenterology

## 2018-03-02 ENCOUNTER — Encounter: Payer: Self-pay | Admitting: Gastroenterology

## 2018-03-02 VITALS — BP 110/68 | HR 82 | Ht 60.0 in | Wt 151.4 lb

## 2018-03-02 DIAGNOSIS — R748 Abnormal levels of other serum enzymes: Secondary | ICD-10-CM

## 2018-03-02 DIAGNOSIS — Z1211 Encounter for screening for malignant neoplasm of colon: Secondary | ICD-10-CM | POA: Diagnosis not present

## 2018-03-02 DIAGNOSIS — R945 Abnormal results of liver function studies: Secondary | ICD-10-CM | POA: Diagnosis not present

## 2018-03-02 DIAGNOSIS — R7989 Other specified abnormal findings of blood chemistry: Secondary | ICD-10-CM

## 2018-03-02 LAB — HEPATIC FUNCTION PANEL
ALBUMIN: 4.3 g/dL (ref 3.5–5.2)
ALT: 133 U/L — AB (ref 0–35)
AST: 44 U/L — AB (ref 0–37)
Alkaline Phosphatase: 106 U/L (ref 39–117)
Bilirubin, Direct: 0.1 mg/dL (ref 0.0–0.3)
Total Bilirubin: 0.3 mg/dL (ref 0.2–1.2)
Total Protein: 7.6 g/dL (ref 6.0–8.3)

## 2018-03-02 MED ORDER — NA SULFATE-K SULFATE-MG SULF 17.5-3.13-1.6 GM/177ML PO SOLN: 1.0000 | Freq: Once | ORAL | 0 refills | Status: AC

## 2018-03-02 NOTE — Patient Instructions (Addendum)
If you are age 57 or older, your body mass index should be between 23-30. Your Body mass index is 29.56 kg/m. If this is out of the aforementioned range listed, please consider follow up with your Primary Care Provider.  If you are age 44 or younger, your body mass index should be between 19-25. Your Body mass index is 29.56 kg/m. If this is out of the aformentioned range listed, please consider follow up with your Primary Care Provider.   You have been scheduled for a colonoscopy. Please follow written instructions given to you at your visit today.  Please pick up your prep supplies at the pharmacy within the next 1-3 days. If you use inhalers (even only as needed), please bring them with you on the day of your procedure. Your physician has requested that you go to www.startemmi.com and enter the access code given to you at your visit today. This web site gives a general overview about your procedure. However, you should still follow specific instructions given to you by our office regarding your preparation for the procedure.  Your provider has requested that you go to the basement level for lab work before leaving today. Press "B" on the elevator. The lab is located at the first door on the left as you exit the elevator.  It was a pleasure to see you today!

## 2018-03-02 NOTE — Progress Notes (Signed)
HPI :  57 year old female with a history of chronic pain, anxiety, COPD, long-term tobacco use, referred here for new patient visit regarding elevated liver enzymes.  She historically denies any history of chronic liver disease. She has multiple labs within the past year showing normal liver function testing ( some in care everywhere). She reports on October 12 she was in her usual state of health but developed acute onset of left lower quadrant pain which lasted for 4 hours. It was sharp, positional in nature. She also has some associated nausea. Her labs showed a leukocytosis of 13 with a normal hemoglobin. LFTs were remarkable for an ALT of 276 and AST of 147, AP of 100 and t bil of 0.5. She denies any right upper quadrant pain at all. She does have chronic pains in her legs, she uses a scooter for long-distance ambulation and when she is out of her house. Otherwise she is able to walk around. For that she has been using a lot of ibuprofen to manage her pain. She wonders if this was related to her liver enzyme elevation. She has stopped using all ibuprofen. She was previously taking pravastatin and has been on that for several years, she also stopped that. She otherwise states she was taking an as needed medicine for nausea and reflux, she can't recall the name. She denies any alcohol use at all. She denies any Tylenol use. She denies any other over-the-counter supplements. She does endorse drinking herbal tea but has only done once recently. She denied any fevers or other viral prodrome symptoms. She is eating okay, no nausea or vomiting currently. Her left lower quadrant pain has resolved on its own has not recurred. She does endorse a history of heavy alcohol use all use in the past, she states she has been sober for the past 4 years or so.  In the ER she to CT scan of abdomen and pelvis as well as an ultrasound of the right upper quadrant. She does not have any evidence of gallstones or parenchymal  abnormalities noted on either study.    Of note, she denies any prior colon cancer screening. She denies any problems with her bowels such as blood in the stools, no constipation or diarrhea. We discussed options for colon cancer screening. She denies any family history of colon cancer.  Past Medical History:  Diagnosis Date  . Anxiety   . Asthma   . COPD (chronic obstructive pulmonary disease) (HCC)   . High cholesterol   . Hypertension      Past Surgical History:  Procedure Laterality Date  . TUBAL LIGATION     Family History  Problem Relation Age of Onset  . Heart disease Mother   . Hypertension Mother    Social History   Tobacco Use  . Smoking status: Current Every Day Smoker    Packs/day: 1.00    Types: Cigarettes  . Smokeless tobacco: Never Used  Substance Use Topics  . Alcohol use: Not Currently    Frequency: Never    Comment: "clean x 4 yrs"  . Drug use: Never   Current Outpatient Medications  Medication Sig Dispense Refill  . amLODipine (NORVASC) 5 MG tablet Take 5 mg by mouth daily.  3  . hydrochlorothiazide (HYDRODIURIL) 12.5 MG tablet Take 12.5 mg by mouth daily.  3  . losartan (COZAAR) 50 MG tablet Take 50 mg by mouth daily.  3   No current facility-administered medications for this visit.    Allergies  Allergen Reactions  . Lisinopril Other (See Comments)    cough     Review of Systems: All systems reviewed and negative except where noted in HPI.    Dg Chest 2 View  Result Date: 02/28/2018 CLINICAL DATA:  Chest pain EXAM: CHEST - 2 VIEW COMPARISON:  02/20/2018, 02/06/2017 FINDINGS: The heart size and mediastinal contours are within normal limits. Both lungs are clear. The visualized skeletal structures are unremarkable. IMPRESSION: No active cardiopulmonary disease. Electronically Signed   By: Jasmine Pang M.D.   On: 02/28/2018 19:56   Dg Chest 2 View  Result Date: 02/20/2018 CLINICAL DATA:  Acute LEFT chest and rib pain. EXAM: CHEST - 2  VIEW COMPARISON:  02/06/2018 and prior radiographs FINDINGS: The cardiomediastinal silhouette is unremarkable. There is no evidence of focal airspace disease, pulmonary edema, suspicious pulmonary nodule/mass, pleural effusion, or pneumothorax. No acute bony abnormalities are identified. IMPRESSION: No active cardiopulmonary disease. Electronically Signed   By: Harmon Pier M.D.   On: 02/20/2018 19:49   Dg Chest 2 View  Result Date: 02/06/2018 CLINICAL DATA:  Chest pain, upper back pain for 3 days, cough EXAM: CHEST - 2 VIEW COMPARISON:  Chest x-ray of 01/20/2018 FINDINGS: No active infiltrate or effusion is seen. Mediastinal and hilar contours are unremarkable. The heart is within normal limits in size. No acute bony abnormality is seen. IMPRESSION: No active cardiopulmonary disease. Electronically Signed   By: Dwyane Dee M.D.   On: 02/06/2018 12:16   Dg Cervical Spine Complete  Result Date: 02/07/2018 CLINICAL DATA:  Initial evaluation for acute on chronic posterior neck pain. No injury. EXAM: CERVICAL SPINE - COMPLETE 4+ VIEW COMPARISON:  None. FINDINGS: Vertebral bodies normally aligned with preservation of the normal cervical lordosis. No listhesis or malalignment. Vertebral body heights maintained. Normal C1-2 articulations are preserved in the dens is intact. No acute fracture or malalignment. Moderate degenerative intervertebral disc space narrowing present at C4-5 through C6-7. Mild to moderate bony foraminal narrowing bilaterally at C5-6 and C6-7. No soft tissue abnormality.  Visualized lung apices are clear. IMPRESSION: 1. No radiographic evidence for acute abnormality within the cervical spine. 2. Moderate cervical spondylolysis at C4-5 through C6-7 with bilateral bony foraminal narrowing at C5-6 and C6-7. Electronically Signed   By: Rise Mu M.D.   On: 02/07/2018 05:06   Ct Abdomen Pelvis W Contrast  Result Date: 02/24/2018 CLINICAL DATA:  LEFT lower quadrant abdominal pain,  nonradiating, for 1 day. Diverticulitis suspected. EXAM: CT ABDOMEN AND PELVIS WITH CONTRAST TECHNIQUE: Multidetector CT imaging of the abdomen and pelvis was performed using the standard protocol following bolus administration of intravenous contrast. CONTRAST:  OMNIPAQUE IOHEXOL 300 MG/ML  SOLN COMPARISON:  None. FINDINGS: Lower chest: No acute abnormality. Hepatobiliary: No focal liver abnormality is seen. No gallstones, gallbladder wall thickening, or biliary dilatation. Pancreas: Unremarkable. No pancreatic ductal dilatation or surrounding inflammatory changes. Spleen: Normal in size without focal abnormality. Adrenals/Urinary Tract: Adrenal glands are unremarkable. Kidneys appear normal without mass, stone or hydronephrosis. No perinephric fluid. No ureteral or bladder calculi identified. Bladder appears normal, partially decompressed. Stomach/Bowel: No dilated large or small bowel loops. No evidence of bowel wall inflammation. No diverticulosis seen. Appendix is normal. Stomach appears normal, partially decompressed. Vascular/Lymphatic: Aortic atherosclerosis. No enlarged abdominal or pelvic lymph nodes. Reproductive: Uterus and bilateral adnexa are unremarkable. Other: No free fluid or abscess collection. No free intraperitoneal air. Musculoskeletal: No acute or suspicious osseous finding. IMPRESSION: 1. No acute findings within the abdomen or pelvis. No  bowel obstruction or evidence of bowel wall inflammation. No diverticulitis. No evidence of acute solid organ abnormality. No renal or ureteral calculi. Appendix is normal. 2.  Aortic Atherosclerosis (ICD10-I70.0). Electronically Signed   By: Bary Richard M.D.   On: 02/24/2018 15:41   US Abdomen Limited Ruq  Result Date: 02/24/2018 CLINICAL DATA:  Right upper abdominal pain EXAM: ULTRASOUND ABDOMEN LIMITED RIGHT UPPER QUADRANT COMPARISON:  None. FINDINGS: Gallbladder: No gallstones or wall thickening visualized. Incompletely distended. 4 mm  nonshadowing focus adjacent to the wall, with other areas suggestive of subtle ring-down artifact. No sonographic Murphy sign noted by sonographer. Common bile duct: Diameter: 2 mm, unremarkable Liver: No focal lesion identified. Within normal limits in parenchymal echogenicity. Portal vein is patent on color Doppler imaging with normal direction of blood flow towards the liver. IMPRESSION: 1. Negative for cholelithiasis or other ultrasound evidence of cholecystitis. 2. Gallbladder wall changes suggesting benign hyperplastic cholecystosis. Electronically Signed   By: Corlis Leak M.D.   On: 02/24/2018 13:57   Lab Results  Component Value Date   ALT 276 (H) 02/24/2018   AST 147 (H) 02/24/2018   ALKPHOS 100 02/24/2018   BILITOT 0.5 02/24/2018      Physical Exam: BP 110/68   Pulse 82   Ht 5' (1.524 m)   Wt 151 lb 6 oz (68.7 kg)   BMI 29.56 kg/m  Constitutional: Pleasant, female in no acute distress. HEENT: Normocephalic and atraumatic. Conjunctivae are normal. No scleral icterus. Neck supple.  Cardiovascular: Normal rate, regular rhythm.  Pulmonary/chest: Effort normal and breath sounds normal. No wheezing, rales or rhonchi. Abdominal: Soft, nondistended, nontender. There are no masses palpable. No hepatomegaly. Extremities: no edema Lymphadenopathy: No cervical adenopathy noted. Neurological: Alert and oriented to person place and time. Skin: Skin is warm and dry. No rashes noted. Psychiatric: Normal mood and affect. Behavior is normal.   ASSESSMENT AND PLAN: 57 year old female here for new patient evaluation of the following issues:  Elevated liver enzymes - acute ALT greater than AST elevation in the setting of left lower quadrant pain. Her pain has since resolved, she had no other symptoms at the time. She was using a significant amount of NSAIDs leading up to that time, perhaps she had drug-induced liver injury related to that. Otherwise no other supplements. Her other medications  would be less likely to be related to cause her liver enzyme elevation and she has been on them chronically. Her pravastatin has been held although this would be unusual to acutely causes elevation as she has been on it for several years. I discussed differential with her. Her imaging is normal and reassuring. Recommend we repeat LFTs today, since it has been about a week since her initial elevation and see where this is trending. I will also send an acute hepatitis panel to ensure that is negative. If the liver enzymes are down trending or have normalized we will trend these over time. If elevation is rising or persistently elevated, we'll send additional serologic evaluation. She will continue to hold the pravastatin at this time until her liver enzymes have improved. She will continue to abstain from alcohol. She will continue to avoid NSAIDs. All questions answered, she agreed with the plan.  Colon cancer screening - no prior screening, discussed options to include stool based testing and colonoscopy. I discussed risks and benefits of colonoscopy and she wanted to proceed. We'll schedule this for the next month or so, evaluate her liver issue first as above and ensure stable  prior to proceeding with colonoscopy. She agreed.  Ileene Patrick, MD [facility]

## 2018-03-03 LAB — HEPATITIS PANEL, ACUTE
HEP A IGM: NONREACTIVE
HEP B S AG: NONREACTIVE
HEP C AB: NONREACTIVE
Hep B C IgM: NONREACTIVE
SIGNAL TO CUT-OFF: 0.02 (ref <1.00)

## 2018-03-05 ENCOUNTER — Telehealth: Payer: Self-pay | Admitting: Gastroenterology

## 2018-03-05 ENCOUNTER — Encounter (HOSPITAL_COMMUNITY): Payer: Self-pay

## 2018-03-05 ENCOUNTER — Emergency Department (HOSPITAL_COMMUNITY)
Admission: EM | Admit: 2018-03-05 | Discharge: 2018-03-05 | Disposition: A | Discharge status: Home / Self Care | Payer: Medicare Other | Attending: Emergency Medicine | Admitting: Emergency Medicine

## 2018-03-05 ENCOUNTER — Other Ambulatory Visit: Payer: Self-pay

## 2018-03-05 DIAGNOSIS — J449 Chronic obstructive pulmonary disease, unspecified: Secondary | ICD-10-CM | POA: Insufficient documentation

## 2018-03-05 DIAGNOSIS — F17210 Nicotine dependence, cigarettes, uncomplicated: Secondary | ICD-10-CM | POA: Insufficient documentation

## 2018-03-05 DIAGNOSIS — Z79899 Other long term (current) drug therapy: Secondary | ICD-10-CM | POA: Diagnosis not present

## 2018-03-05 DIAGNOSIS — R748 Abnormal levels of other serum enzymes: Secondary | ICD-10-CM

## 2018-03-05 DIAGNOSIS — R1013 Epigastric pain: Secondary | ICD-10-CM | POA: Insufficient documentation

## 2018-03-05 DIAGNOSIS — I10 Essential (primary) hypertension: Secondary | ICD-10-CM | POA: Diagnosis not present

## 2018-03-05 LAB — CBC
HCT: 44.5 % (ref 36.0–46.0)
HEMOGLOBIN: 14.1 g/dL (ref 12.0–15.0)
MCH: 30.1 pg (ref 26.0–34.0)
MCHC: 31.7 g/dL (ref 30.0–36.0)
MCV: 95.1 fL (ref 80.0–100.0)
NRBC: 0 % (ref 0.0–0.2)
PLATELETS: 361 10*3/uL (ref 150–400)
RBC: 4.68 MIL/uL (ref 3.87–5.11)
RDW: 11.9 % (ref 11.5–15.5)
WBC: 12 10*3/uL — AB (ref 4.0–10.5)

## 2018-03-05 LAB — COMPREHENSIVE METABOLIC PANEL
ALK PHOS: 91 U/L (ref 38–126)
ALT: 84 U/L — ABNORMAL HIGH (ref 0–44)
ANION GAP: 10 (ref 5–15)
AST: 34 U/L (ref 15–41)
Albumin: 4 g/dL (ref 3.5–5.0)
BILIRUBIN TOTAL: 0.3 mg/dL (ref 0.3–1.2)
BUN: 8 mg/dL (ref 6–20)
CALCIUM: 9.5 mg/dL (ref 8.9–10.3)
CO2: 24 mmol/L (ref 22–32)
Chloride: 104 mmol/L (ref 98–111)
Creatinine, Ser: 0.74 mg/dL (ref 0.44–1.00)
GFR calc non Af Amer: >60 mL/min (ref >60)
GLUCOSE: 92 mg/dL (ref 70–99)
POTASSIUM: 3.8 mmol/L (ref 3.5–5.1)
Sodium: 138 mmol/L (ref 135–145)
TOTAL PROTEIN: 7.7 g/dL (ref 6.5–8.1)

## 2018-03-05 LAB — I-STAT BETA HCG BLOOD, ED (MC, WL, AP ONLY): I-stat hCG, quantitative: 7.1 m[IU]/mL — ABNORMAL HIGH (ref <5)

## 2018-03-05 LAB — LIPASE, BLOOD: Lipase: 34 U/L (ref 11–51)

## 2018-03-05 MED ORDER — FAMOTIDINE 20 MG PO TABS: 20.0000 mg | ORAL_TABLET | Freq: Two times a day (BID) | ORAL | 0 refills | Status: DC

## 2018-03-05 MED ORDER — FAMOTIDINE 20 MG PO TABS: 40.0000 mg | ORAL_TABLET | Freq: Once | ORAL | Status: DC

## 2018-03-05 NOTE — ED Notes (Signed)
ED Provider at bedside. 

## 2018-03-05 NOTE — Progress Notes (Signed)
Repeat labs in 2 to 3 weeks

## 2018-03-05 NOTE — ED Provider Notes (Signed)
[facility] EMERGENCY DEPARTMENT Provider Note   CSN: [id number] Arrival date & time: 03/05/18  1519     History   Chief Complaint Chief Complaint  Patient presents with  . Abdominal Pain  . Back Pain    HPI Ashley Hodge is a 57 y.o. female.  Patient presents with complaint of upper abdominal pain that is sharp and radiates across upper abdomen into back. She reports similar symptoms in the past and states "I think its reflux". She did not take anything prior to arrival. No nausea or vomiting. No fever. She states her bowel movements have been harder than usual but she continues to have regular, daily BM's. No chest pain, SOB, cough. No urinary symptoms. She states she has had elevated liver enzymes diagnosed recently and this was of concern to her.   The history is provided by the patient.    Past Medical History:  Diagnosis Date  . Anxiety   . Asthma   . COPD (chronic obstructive pulmonary disease) (HCC)   . High cholesterol   . Hypertension     There are no active problems to display for this patient.   Past Surgical History:  Procedure Laterality Date  . TUBAL LIGATION       OB History    Gravida      Para      Term      Preterm      AB      Living  5     SAB      TAB      Ectopic      Multiple      Live Births               Home Medications    Prior to Admission medications   Medication Sig Start Date End Date Taking? Authorizing Provider  amLODipine (NORVASC) 5 MG tablet Take 5 mg by mouth daily. 09/07/17   [provider]  hydrochlorothiazide (HYDRODIURIL) 12.5 MG tablet Take 12.5 mg by mouth daily. 09/07/17   [provider]  losartan (COZAAR) 50 MG tablet Take 50 mg by mouth daily. 09/07/17   [provider]    Family History Family History  Problem Relation Age of Onset  . Heart disease Mother   . Hypertension Mother     Social History Social History   Tobacco Use  .  Smoking status: Current Every Day Smoker    Packs/day: 1.00    Types: Cigarettes  . Smokeless tobacco: Never Used  Substance Use Topics  . Alcohol use: Not Currently    Frequency: Never    Comment: "clean x 4 yrs"  . Drug use: Never     Allergies   Lisinopril   Review of Systems Review of Systems  Constitutional: Negative for chills and fever.  HENT: Negative.   Respiratory: Negative.  Negative for shortness of breath.   Cardiovascular: Negative.  Negative for chest pain.  Gastrointestinal: Positive for abdominal pain. Negative for nausea and vomiting.  Genitourinary: Negative.   Musculoskeletal: Positive for back pain (Radiating from abdomen.).  Skin: Negative.   Neurological: Negative.      Physical Exam Updated Vital Signs BP 119/70   Pulse 86   Temp 98.4 F (36.9 C) (Oral)   Resp 18   SpO2 97%   Physical Exam  Constitutional: She appears well-developed and well-nourished.  HENT:  Head: Normocephalic.  Neck: Normal range of motion. Neck supple.  Cardiovascular: Normal rate and regular  rhythm.  No murmur heard. Pulmonary/Chest: Effort normal and breath sounds normal. She has no wheezes. She has no rhonchi. She has no rales.  Abdominal: Soft. Bowel sounds are normal. She exhibits no distension. There is tenderness in the right upper quadrant, epigastric area and left upper quadrant. There is no rebound and no guarding.  Musculoskeletal: Normal range of motion.  Neurological: She is alert. No cranial nerve deficit.  Skin: Skin is warm and dry. No rash noted.  Psychiatric: She has a normal mood and affect.     ED Treatments / Results  Labs (all labs ordered are listed, but only abnormal results are displayed) Labs Reviewed  COMPREHENSIVE METABOLIC PANEL - Abnormal; Notable for the following components:      Result Value   ALT 84 (*)    All other components within normal limits  CBC - Abnormal; Notable for the following components:   WBC 12.0 (*)    All  other components within normal limits  I-STAT BETA HCG BLOOD, ED ([facility], [facility], [facility] ONLY) - Abnormal; Notable for the following components:   I-stat hCG, quantitative 7.1 (*)    All other components within normal limits  LIPASE, BLOOD  URINALYSIS, ROUTINE W REFLEX MICROSCOPIC    EKG None  Radiology No results found.  Procedures Procedures (including critical care time)  Medications Ordered in ED Medications  famotidine (PEPCID) tablet 40 mg (has no administration in time range)     Initial Impression / Assessment and Plan / ED Course  I have reviewed the triage vital signs and the nursing notes.  Pertinent labs & imaging results that were available during my care of the patient were reviewed by me and considered in my medical decision making (see chart for details).     Patient presents with recurrent upper abdominal pain described as sharp, radiates across abdomen and into back. No N, V, CP, SOB. Recent elevated diagnosis of elevated liver enzymes.   Labs reassuring. Liver enzymes normalizing. Chart reviewed. REcent RUQ [place] and CT abd/pel (02/24/18) negative. She has had GI follow u pin the interim. VSS.   Offered GI cocktail but patient reports "I don't like that stuff". Pepcid offered and provided. Doubt acute abdominal process. She has a history of similar pain with previous diagnosis of GERD.   She can be discharged home with Rx pepcid. Recommend PCP follow up.  Final Clinical Impressions(s) / ED Diagnoses   Final diagnoses:  None   1. Epigastric abdominal pain  ED Discharge Orders    None       Elpidio Anis, PA-C 03/06/18 0005    Gwyneth Sprout, MD 03/06/18 1452

## 2018-03-05 NOTE — ED Provider Notes (Signed)
Patient placed in Quick Look pathway, seen and evaluated   Chief Complaint: abdominal pain  HPI: Ashley Hodge is a 57 y.o. female with hx of asthma, COPD, HTN anxiety and elevated liver enzymes who presents to the ED with abdominal pain that started suddenly today. The pain is located in the upper abdomen and patient rates the pain as 10/10. Patient denies n/v, fever or chills.   ROS: GI: abdominal pain  Physical Exam:  BP 126/74   Pulse 96   Temp 98.7 F (37.1 C) (Oral)   Resp 18   SpO2 97%    Gen: No distress, patient crying due to pain  Neuro: Awake and Alert  Skin: Warm and dry  Abdomen: tender with palpation upper abdomen     Initiation of care has begun. The patient has been counseled on the process, plan, and necessity for staying for the completion/evaluation, and the remainder of the medical screening examination    Janne Napoleon, NP 03/05/18 1529    Gwyneth Sprout, MD 03/06/18 1447

## 2018-03-05 NOTE — Discharge Instructions (Addendum)
Follow up with your primary care doctor for recheck next week. Take Pepcid twice daily for current symptoms.

## 2018-03-05 NOTE — ED Triage Notes (Signed)
Pt here for evaluation of upper back and central abdominal pain that began this morning.  Did not take any medications for the pain yet.  Hx of acid reflux and drank carbonated beverages prior to the pain starting.  Seen a week ago and found new elevated liver enzymes.

## 2018-03-05 NOTE — ED Notes (Signed)
Patient left at this time with all belongings. 

## 2018-03-05 NOTE — Telephone Encounter (Signed)
You may have already done this.

## 2018-03-19 ENCOUNTER — Telehealth: Payer: Self-pay

## 2018-03-19 NOTE — Telephone Encounter (Signed)
-----   Message from Cooper Render, CMA sent at 03/05/2018 10:46 AM EDT ----- Regarding: LFTs due Repeat LFTs due week of Nov 4th or 11th.  Orders are in.  Call pt to remind

## 2018-03-19 NOTE — Telephone Encounter (Signed)
Called and spoke to pt.  She will go to the lab one day this week or next. LFT order is in

## 2018-04-03 ENCOUNTER — Encounter: Payer: Self-pay | Admitting: Gastroenterology

## 2018-04-11 NOTE — ED Provider Notes (Signed)
Patient left without being seen  1. Patient left without being seen       Melene PlanFloyd, Caci Orren, DO 04/11/18 1214

## 2018-07-25 ENCOUNTER — Emergency Department (HOSPITAL_COMMUNITY): Payer: Medicare Other

## 2018-07-25 ENCOUNTER — Other Ambulatory Visit: Payer: Self-pay

## 2018-07-25 ENCOUNTER — Emergency Department (HOSPITAL_COMMUNITY)
Admission: EM | Admit: 2018-07-25 | Discharge: 2018-07-25 | Disposition: A | Discharge status: Home / Self Care | Payer: Medicare Other | Attending: Emergency Medicine | Admitting: Emergency Medicine

## 2018-07-25 ENCOUNTER — Encounter (HOSPITAL_COMMUNITY): Payer: Self-pay

## 2018-07-25 DIAGNOSIS — F17210 Nicotine dependence, cigarettes, uncomplicated: Secondary | ICD-10-CM | POA: Diagnosis not present

## 2018-07-25 DIAGNOSIS — M25512 Pain in left shoulder: Secondary | ICD-10-CM | POA: Diagnosis not present

## 2018-07-25 DIAGNOSIS — J45909 Unspecified asthma, uncomplicated: Secondary | ICD-10-CM | POA: Insufficient documentation

## 2018-07-25 DIAGNOSIS — I10 Essential (primary) hypertension: Secondary | ICD-10-CM | POA: Insufficient documentation

## 2018-07-25 DIAGNOSIS — R079 Chest pain, unspecified: Secondary | ICD-10-CM | POA: Diagnosis not present

## 2018-07-25 DIAGNOSIS — Z79899 Other long term (current) drug therapy: Secondary | ICD-10-CM | POA: Diagnosis not present

## 2018-07-25 DIAGNOSIS — J449 Chronic obstructive pulmonary disease, unspecified: Secondary | ICD-10-CM | POA: Insufficient documentation

## 2018-07-25 DIAGNOSIS — R1084 Generalized abdominal pain: Secondary | ICD-10-CM | POA: Diagnosis present

## 2018-07-25 LAB — CBC WITH DIFFERENTIAL/PLATELET
ABS IMMATURE GRANULOCYTES: 0.06 10*3/uL (ref 0.00–0.07)
BASOS PCT: 1 %
Basophils Absolute: 0.1 10*3/uL (ref 0.0–0.1)
Eosinophils Absolute: 0.1 10*3/uL (ref 0.0–0.5)
Eosinophils Relative: 1 %
HCT: 43.6 % (ref 36.0–46.0)
Hemoglobin: 14.1 g/dL (ref 12.0–15.0)
IMMATURE GRANULOCYTES: 1 %
Lymphocytes Relative: 33 %
Lymphs Abs: 4.2 10*3/uL — ABNORMAL HIGH (ref 0.7–4.0)
MCH: 31.1 pg (ref 26.0–34.0)
MCHC: 32.3 g/dL (ref 30.0–36.0)
MCV: 96 fL (ref 80.0–100.0)
MONOS PCT: 7 %
Monocytes Absolute: 0.9 10*3/uL (ref 0.1–1.0)
NEUTROS ABS: 7.6 10*3/uL (ref 1.7–7.7)
NEUTROS PCT: 57 %
PLATELETS: 357 10*3/uL (ref 150–400)
RBC: 4.54 MIL/uL (ref 3.87–5.11)
RDW: 11.7 % (ref 11.5–15.5)
WBC: 12.9 10*3/uL — ABNORMAL HIGH (ref 4.0–10.5)
nRBC: 0 % (ref 0.0–0.2)

## 2018-07-25 LAB — BASIC METABOLIC PANEL
ANION GAP: 11 (ref 5–15)
BUN: 14 mg/dL (ref 6–20)
CALCIUM: 9 mg/dL (ref 8.9–10.3)
CO2: 25 mmol/L (ref 22–32)
Chloride: 105 mmol/L (ref 98–111)
Creatinine, Ser: 0.81 mg/dL (ref 0.44–1.00)
GFR calc Af Amer: >60 mL/min (ref >60)
GFR calc non Af Amer: >60 mL/min (ref >60)
GLUCOSE: 147 mg/dL — AB (ref 70–99)
POTASSIUM: 3.1 mmol/L — AB (ref 3.5–5.1)
SODIUM: 141 mmol/L (ref 135–145)

## 2018-07-25 LAB — I-STAT TROPONIN, ED
Troponin i, poc: 0 ng/mL (ref 0.00–0.08)
Troponin i, poc: 0 ng/mL (ref 0.00–0.08)

## 2018-07-25 MED ORDER — ACETAMINOPHEN 325 MG PO TABS: 650.0000 mg | ORAL_TABLET | Freq: Once | ORAL | Status: DC

## 2018-07-25 MED ORDER — LIDOCAINE 5 % EX PTCH
1.0000 | MEDICATED_PATCH | Freq: Once | CUTANEOUS | Status: DC
  Filled 2018-07-25: qty 1

## 2018-07-25 MED ORDER — POTASSIUM CHLORIDE CRYS ER 20 MEQ PO TBCR
40.0000 meq | EXTENDED_RELEASE_TABLET | Freq: Once | ORAL | Status: AC
  Administered 2018-07-25: 40 meq via ORAL
  Filled 2018-07-25: qty 2

## 2018-07-25 NOTE — ED Triage Notes (Signed)
Pt states that she is having left side axilla and arm pain since yesterday. Denies injury/and trauma.

## 2018-07-25 NOTE — ED Provider Notes (Signed)
[facility]-EMERGENCY DEPT Provider Note   CSN: [id number] Arrival date & time: 07/25/18  1017    History   Chief Complaint Chief Complaint  Patient presents with  . Generalized Body Aches    HPI Ashley Hodge is a 58 y.o. female.  HPI: A 58 year old patient with a history of hypertension and hypercholesterolemia presents for evaluation of chest pain. Initial onset of pain was approximately 1-3 hours ago. The patient's chest pain is well-localized, is sharp and is not worse with exertion. The patient complains of nausea. The patient's chest pain is middle- or left-sided, is not described as heaviness/pressure/tightness and does radiate to the arms/jaw/neck. The patient denies diaphoresis. The patient has smoked in the past 90 days and has a family history of coronary artery disease in a first-degree relative with onset less than age 2. The patient has no history of stroke, has no history of peripheral artery disease, denies any history of treated diabetes and does not have an elevated BMI (>=30).  Patient denies any trauma to the arm.  She has been treated before with pain felt secondary to GERD, as well as left rotator cuff arthropathy.  Past Medical History:  Diagnosis Date  . Anxiety   . Asthma   . COPD (chronic obstructive pulmonary disease) (HCC)   . High cholesterol   . Hypertension     There are no active problems to display for this patient.   Past Surgical History:  Procedure Laterality Date  . TUBAL LIGATION       OB History    Gravida      Para      Term      Preterm      AB      Living  5     SAB      TAB      Ectopic      Multiple      Live Births               Home Medications    Prior to Admission medications   Medication Sig Start Date End Date Taking? Authorizing Provider  amLODipine (NORVASC) 5 MG tablet Take 5 mg by mouth daily. 09/07/17  Yes [provider]  hydrochlorothiazide (HYDRODIURIL) 12.5  MG tablet Take 12.5 mg by mouth daily. 09/07/17  Yes [provider]  losartan (COZAAR) 50 MG tablet Take 50 mg by mouth daily. 09/07/17  Yes [provider]  pravastatin (PRAVACHOL) 80 MG tablet Take 80 mg by mouth daily. 03/16/18 07/04/19 Yes [provider]  famotidine (PEPCID) 20 MG tablet Take 1 tablet (20 mg total) by mouth 2 (two) times daily. Patient not taking: Reported on 07/25/2018 03/05/18   Elpidio Anis, PA-C    Family History Family History  Problem Relation Age of Onset  . Heart disease Mother   . Hypertension Mother     Social History Social History   Tobacco Use  . Smoking status: Current Every Day Smoker    Packs/day: 1.00    Types: Cigarettes  . Smokeless tobacco: Never Used  Substance Use Topics  . Alcohol use: Not Currently    Frequency: Never    Comment: "clean x 4 yrs"  . Drug use: Never     Allergies   Lisinopril   Review of Systems Review of Systems  Constitutional: Negative for chills and fever.  HENT: Negative for congestion, rhinorrhea, sinus pain and sore throat.   Eyes: Negative for visual disturbance.  Respiratory:  Negative for cough, chest tightness and shortness of breath.   Cardiovascular: Positive for chest pain. Negative for palpitations and leg swelling.  Gastrointestinal: Negative for abdominal pain, nausea and vomiting.  Genitourinary: Negative for dysuria and flank pain.  Musculoskeletal: Positive for arthralgias and myalgias. Negative for back pain.  Skin: Negative for rash.  Neurological: Negative for dizziness, syncope, weakness, light-headedness, numbness and headaches.     Physical Exam Updated Vital Signs BP 120/74 (BP Location: Left Arm)   Pulse 89   Temp 98.2 F (36.8 C) (Oral)   Resp 15   Ht 5' (1.524 m)   Wt 70.3 kg   SpO2 97%   BMI 30.27 kg/m   Physical Exam Vitals signs and nursing note reviewed.  Constitutional:      General: She is not in acute distress.    Appearance: She  is well-developed.  HENT:     Head: Normocephalic and atraumatic.     Mouth/Throat:     Comments: Poor dentition. Eyes:     Conjunctiva/sclera: Conjunctivae normal.     Pupils: Pupils are equal, round, and reactive to light.  Neck:     Musculoskeletal: Normal range of motion and neck supple.  Cardiovascular:     Rate and Rhythm: Normal rate and regular rhythm.     Heart sounds: S1 normal and S2 normal. No murmur.  Pulmonary:     Effort: Pulmonary effort is normal.     Breath sounds: Normal breath sounds. No wheezing or rales.  Abdominal:     General: There is no distension.     Palpations: Abdomen is soft.     Tenderness: There is no abdominal tenderness. There is no guarding.  Musculoskeletal: Normal range of motion.        General: No deformity.     Comments: Left shoulder with tenderness to palpation over scapula. No erythema.  Normal ROM. Negative empty can test, negative Neer's,  No swelling, erythema or ecchymosis present. No step-off, crepitus, or deformity appreciated. 5/5 muscle strength of LUE. 2+ radial pulse, sensation intact and all compartments soft.   Lymphadenopathy:     Cervical: No cervical adenopathy.  Skin:    General: Skin is warm and dry.     Findings: No erythema or rash.  Neurological:     Mental Status: She is alert.     Comments: Cranial nerves grossly intact. Patient moves extremities symmetrically and with good coordination.  Psychiatric:        Behavior: Behavior normal.        Thought Content: Thought content normal.        Judgment: Judgment normal.      ED Treatments / Results  Labs (all labs ordered are listed, but only abnormal results are displayed) Labs Reviewed  CBC WITH DIFFERENTIAL/PLATELET - Abnormal; Notable for the following components:      Result Value   WBC 12.9 (*)    Lymphs Abs 4.2 (*)    All other components within normal limits  BASIC METABOLIC PANEL - Abnormal; Notable for the following components:   Potassium 3.1  (*)    Glucose, Bld 147 (*)    All other components within normal limits  I-STAT TROPONIN, ED  I-STAT TROPONIN, ED    EKG EKG Interpretation  Date/Time:  Wednesday July 25 2018 11:51:14 EDT Ventricular Rate:  71 PR Interval:    QRS Duration: 87 QT Interval:  371 QTC Calculation: 404 R Axis:   23 Text Interpretation:  Sinus rhythm Low voltage,  precordial leads No significant change since last tracing Confirmed by Richardean Canal [phone]) on 07/25/2018 12:28:48 PM   Radiology Dg Chest 2 View  Result Date: 07/25/2018 CLINICAL DATA:  Chest pain and cough. EXAM: CHEST - 2 VIEW COMPARISON:  02/28/2018 FINDINGS: The heart size and mediastinal contours are within normal limits. Both lungs are clear. Stable mild eventration of the right hemidiaphragm. The visualized skeletal structures are unremarkable. IMPRESSION: Normal chest x-ray. Electronically Signed   By: Rudie Meyer M.D.   On: 07/25/2018 12:53    Procedures Procedures (including critical care time)  Medications Ordered in ED Medications  acetaminophen (TYLENOL) tablet 650 mg (650 mg Oral Refused 07/25/18 1153)  potassium chloride SA (K-DUR,KLOR-CON) CR tablet 40 mEq (40 mEq Oral Given 07/25/18 1346)     Initial Impression / Assessment and Plan / ED Course  I have reviewed the triage vital signs and the nursing notes.  Pertinent labs & imaging results that were available during my care of the patient were reviewed by me and considered in my medical decision making (see chart for details).  Clinical Course as of Jul 25 1447  Wed Jul 25, 2018  1414 Pt has chronic leukocytosis.   WBC(!): 12.9 [AM]    Clinical Course User Index [AM] Elisha Ponder, PA-C    HEAR Score: 3  Differential diagnosis includes ACS, PE, thoracic aortic dissection, Boerhaave's syndrome, cardiac tamponade, pneumothorax, incarcerated diaphragmatic hernia, cholecystitis, esophageal spasm, gastroesophageal reflux, herpes zoster of the thorax,  pericarditis, pneumonia, chest wall pain, costochondritis.   Patient's pain is not exclusively reproducible in the shoulder, and she does have cardiac risk factors.  However primary musculoskeletal shoulder pathologies were considered.  There is no evidence of septic arthritis of the shoulder.  She does have a history of rotator cuff tendinopathy which may be contributing to her symptoms.  Doubt ACS, as delta troponin is negative, initial and repeat EKGs show no signs of ischemia, infarction, or arrhythmia, and HEART score 3 (age, risk factors). Doubt PE as Well's score 1 and PERC negative, and patient not tachycardic. Doubt TAD by hx, CXR showed no widening mediastinum, and pulses equal in all extremities. Patient remained nontoxic appearing and in no acute distress during emergency department course. Vital signs stable in the emergency department. Pericarditis less likely due to no preceding infectious symptoms and pain not improved in upright positions. Abnormal labs include chronic leukocytosis, and hypokalemia, repleted.  Case reviewed with attending physician, Dr. Chaney Malling, who is in agreement.  Patient did have some borderline soft blood pressure readings.  She is on 3 different antihypertensives.  She discussed her blood pressure log with me, and I reviewed the chart for recent blood pressure and it appears that she has had some soft blood pressures in emergency department visits over the past 1.5 weeks.  Discussed with patient that it is reasonable to stop amlodipine well obtaining several blood pressure readings and following up with her primary care provider.  Will determine if she continues to need this medication.  She is on 2 other antihypertensives.  Patient and family understand and are in agreement with plan of care.   Final Clinical Impressions(s) / ED Diagnoses   Final diagnoses:  Left-sided chest pain  Acute pain of left shoulder    ED Discharge Orders    None        Delia Chimes 07/25/18 2037    Charlynne Pander, MD 07/27/18 [phone]

## 2018-07-25 NOTE — ED Notes (Signed)
Patient refusing IV. PA made aware.

## 2018-07-25 NOTE — ED Notes (Signed)
Patient ambulatory to restroom without difficulty. 

## 2018-07-25 NOTE — ED Notes (Signed)
Patient given sandwich, coke, and water.

## 2018-07-25 NOTE — ED Notes (Signed)
Patient resting with eyes closed in wheel chair.

## 2018-07-25 NOTE — Discharge Instructions (Addendum)
Please read and follow all provided instructions.  Your diagnoses today include:  1. Left-sided chest pain   2. Acute pain of left shoulder     Tests performed today include: An EKG of your heart A chest x-ray Cardiac enzymes - a blood test for heart muscle damage Blood counts and electrolytes Vital signs. See below for your results today.   Medications prescribed:   Take any prescribed medications only as directed.  Since you have had some low blood pressures, it is reasonable to do a trial of stopping your amlodipine.  Please take your blood pressure twice a day so he has some data to provide to your primary care provider.  Follow-up instructions: Please follow-up with your primary care provider as soon as you can for further evaluation of your symptoms.   Return instructions:  SEEK IMMEDIATE MEDICAL ATTENTION IF: You have severe chest pain, especially if the pain is crushing or pressure-like and spreads to the arms, back, neck, or jaw, or if you have sweating, nausea (feeling sick to your stomach), or shortness of breath. THIS IS AN EMERGENCY. Don't wait to see if the pain will go away. Get medical help at once. Call 911 or [phone] (operator). DO NOT drive yourself to the hospital.  Your chest pain gets worse and does not go away with rest.  You have an attack of chest pain lasting longer than usual, despite rest and treatment with the medications your caregiver has prescribed.  You wake from sleep with chest pain or shortness of breath. You feel dizzy or faint. You have chest pain not typical of your usual pain for which you originally saw your caregiver.  You have any other emergent concerns regarding your health.  Additional Information: Chest pain comes from many different causes. Your caregiver has diagnosed you as having chest pain that is not specific for one problem, but does not require admission.  You are at low risk for an acute heart condition or other serious illness.    Your vital signs today were: BP 120/74 (BP Location: Left Arm)    Pulse 89    Temp 98.2 F (36.8 C) (Oral)    Resp 15    Ht 5' (1.524 m)    Wt 70.3 kg    SpO2 97%    BMI 30.27 kg/m  If your blood pressure (BP) was elevated above 135/85 this visit, please have this repeated by your doctor within one month. --------------

## 2018-07-25 NOTE — ED Notes (Signed)
Patient ambulatory to XR

## 2018-08-03 ENCOUNTER — Emergency Department (HOSPITAL_COMMUNITY): Payer: Medicare Other

## 2018-08-03 ENCOUNTER — Emergency Department (HOSPITAL_COMMUNITY)
Admission: EM | Admit: 2018-08-03 | Discharge: 2018-08-03 | Disposition: A | Discharge status: Home / Self Care | Payer: Medicare Other | Attending: Emergency Medicine | Admitting: Emergency Medicine

## 2018-08-03 ENCOUNTER — Other Ambulatory Visit: Payer: Self-pay

## 2018-08-03 ENCOUNTER — Encounter (HOSPITAL_COMMUNITY): Payer: Self-pay | Admitting: Emergency Medicine

## 2018-08-03 DIAGNOSIS — R0789 Other chest pain: Secondary | ICD-10-CM

## 2018-08-03 DIAGNOSIS — I10 Essential (primary) hypertension: Secondary | ICD-10-CM | POA: Diagnosis not present

## 2018-08-03 DIAGNOSIS — J449 Chronic obstructive pulmonary disease, unspecified: Secondary | ICD-10-CM | POA: Insufficient documentation

## 2018-08-03 DIAGNOSIS — J45909 Unspecified asthma, uncomplicated: Secondary | ICD-10-CM | POA: Insufficient documentation

## 2018-08-03 DIAGNOSIS — F17210 Nicotine dependence, cigarettes, uncomplicated: Secondary | ICD-10-CM | POA: Diagnosis not present

## 2018-08-03 DIAGNOSIS — Z79899 Other long term (current) drug therapy: Secondary | ICD-10-CM | POA: Insufficient documentation

## 2018-08-03 DIAGNOSIS — R1013 Epigastric pain: Secondary | ICD-10-CM | POA: Diagnosis not present

## 2018-08-03 DIAGNOSIS — L299 Pruritus, unspecified: Secondary | ICD-10-CM | POA: Diagnosis not present

## 2018-08-03 LAB — BASIC METABOLIC PANEL
ANION GAP: 10 (ref 5–15)
BUN: 14 mg/dL (ref 6–20)
CHLORIDE: 105 mmol/L (ref 98–111)
CO2: 23 mmol/L (ref 22–32)
Calcium: 9.2 mg/dL (ref 8.9–10.3)
Creatinine, Ser: 0.74 mg/dL (ref 0.44–1.00)
GFR calc Af Amer: >60 mL/min (ref >60)
GLUCOSE: 81 mg/dL (ref 70–99)
POTASSIUM: 3.1 mmol/L — AB (ref 3.5–5.1)
Sodium: 138 mmol/L (ref 135–145)

## 2018-08-03 LAB — CBC
HEMATOCRIT: 43.3 % (ref 36.0–46.0)
HEMOGLOBIN: 14.3 g/dL (ref 12.0–15.0)
MCH: 31 pg (ref 26.0–34.0)
MCHC: 33 g/dL (ref 30.0–36.0)
MCV: 93.9 fL (ref 80.0–100.0)
Platelets: 356 10*3/uL (ref 150–400)
RBC: 4.61 MIL/uL (ref 3.87–5.11)
RDW: 11.6 % (ref 11.5–15.5)
WBC: 13.9 10*3/uL — ABNORMAL HIGH (ref 4.0–10.5)
nRBC: 0 % (ref 0.0–0.2)

## 2018-08-03 LAB — I-STAT BETA HCG BLOOD, ED (NOT ORDERABLE): I-stat hCG, quantitative: 7.7 m[IU]/mL — ABNORMAL HIGH (ref <5)

## 2018-08-03 LAB — POCT I-STAT TROPONIN I: Troponin i, poc: 0 ng/mL (ref 0.00–0.08)

## 2018-08-03 MED ORDER — PERMETHRIN 5 % EX CREA: 1.0000 "application " | TOPICAL_CREAM | Freq: Once | CUTANEOUS | 0 refills | Status: AC

## 2018-08-03 NOTE — ED Triage Notes (Signed)
Pt c/o left sided chest pain and back pain that been going on all day.

## 2018-08-03 NOTE — ED Provider Notes (Signed)
[facility]-EMERGENCY DEPT Provider Note   CSN: [id number] Arrival date & time: 08/03/18  1859    History   Chief Complaint Chief Complaint  Patient presents with  . chest pain  . Back Pain    HPI Ashley Hodge is a 58 y.o. female.     The history is provided by the patient and medical records. No language interpreter was used.  Back Pain   Ashley Hodge is a 58 y.o. female who presents to the Emergency Department complaining of chest pain/back pain.  She reports three days of central chest pain that is waxing and waining in nature.  No clear alleviating or worsening factors.  Three weeks ago she was diagnosed with bronchitis - overall that is improving.  Denies fevers, injuries/accidents, diaphoresis, leg swelling/pain.  She has mild epigastric pain/nausea, diarrhea.  She is currently taking amoxicillin (started about 5 days ago). She denies any history of blood clots. She also complains of itching to her back and scalp. Her grandson also has scalp itching and is here for evaluation. Past Medical History:  Diagnosis Date  . Anxiety   . Asthma   . COPD (chronic obstructive pulmonary disease) (HCC)   . High cholesterol   . Hypertension     There are no active problems to display for this patient.   Past Surgical History:  Procedure Laterality Date  . TUBAL LIGATION       OB History    Gravida      Para      Term      Preterm      AB      Living  5     SAB      TAB      Ectopic      Multiple      Live Births               Home Medications    Prior to Admission medications   Medication Sig Start Date End Date Taking? Authorizing Provider  amLODipine (NORVASC) 5 MG tablet Take 5 mg by mouth daily. 09/07/17  Yes [provider]  hydrochlorothiazide (HYDRODIURIL) 12.5 MG tablet Take 12.5 mg by mouth daily. 09/07/17  Yes [provider]  losartan (COZAAR) 50 MG tablet Take 50 mg by mouth daily. 09/07/17  Yes  [provider]  pravastatin (PRAVACHOL) 80 MG tablet Take 80 mg by mouth daily. 03/16/18 07/04/19 Yes [provider]  albuterol (PROVENTIL HFA;VENTOLIN HFA) 108 (90 Base) MCG/ACT inhaler Inhale 1-2 puffs into the lungs every 6 (six) hours as needed for shortness of breath or wheezing. 08/02/18   [provider]  permethrin (ELIMITE) 5 % cream Apply 1 application topically once for 1 dose. 08/03/18 08/03/18  Tilden Fossa, MD    Family History Family History  Problem Relation Age of Onset  . Heart disease Mother   . Hypertension Mother     Social History Social History   Tobacco Use  . Smoking status: Current Every Day Smoker    Packs/day: 1.00    Types: Cigarettes  . Smokeless tobacco: Never Used  Substance Use Topics  . Alcohol use: Not Currently    Frequency: Never    Comment: "clean x 4 yrs"  . Drug use: Never     Allergies   Lisinopril   Review of Systems Review of Systems  Musculoskeletal: Positive for back pain.  All other systems reviewed and are negative.    Physical Exam Updated Vital  Signs BP 105/69   Pulse 80   Temp 98.4 F (36.9 C) (Oral)   Resp 13   SpO2 97%   Physical Exam Vitals signs and nursing note reviewed.  Constitutional:      Appearance: She is well-developed.  HENT:     Head: Normocephalic and atraumatic.  Cardiovascular:     Rate and Rhythm: Normal rate and regular rhythm.     Heart sounds: No murmur.  Pulmonary:     Effort: Pulmonary effort is normal. No respiratory distress.     Breath sounds: Normal breath sounds.  Chest:     Chest wall: No tenderness.  Abdominal:     Palpations: Abdomen is soft.     Tenderness: There is no abdominal tenderness. There is no guarding or rebound.  Musculoskeletal:        General: No swelling or tenderness.  Skin:    General: Skin is warm and dry.     Comments: Scattered macules to the upper back  Neurological:     Mental Status: She is alert and oriented to  person, place, and time.  Psychiatric:        Mood and Affect: Mood normal.        Behavior: Behavior normal.      ED Treatments / Results  Labs (all labs ordered are listed, but only abnormal results are displayed) Labs Reviewed  BASIC METABOLIC PANEL - Abnormal; Notable for the following components:      Result Value   Potassium 3.1 (*)    All other components within normal limits  CBC - Abnormal; Notable for the following components:   WBC 13.9 (*)    All other components within normal limits  I-STAT BETA HCG BLOOD, ED (NOT ORDERABLE) - Abnormal; Notable for the following components:   I-stat hCG, quantitative 7.7 (*)    All other components within normal limits  I-STAT TROPONIN, ED  I-STAT BETA HCG BLOOD, ED ([facility], [facility], [facility] ONLY)  POCT I-STAT TROPONIN I    EKG None ED ECG REPORT   Date: 08/03/2018  Rate: 106  Rhythm: sinus tachycardia  QRS Axis: normal  Intervals: normal  ST/T Wave abnormalities: normal  Conduction Disutrbances:none  Narrative Interpretation:   Old EKG Reviewed: unchanged Atrial premature complex I have personally reviewed the EKG tracing and agree with the computerized printout as noted.  Radiology Dg Chest Port 1 View  Result Date: 08/03/2018 CLINICAL DATA:  Pt c/o left sided chest pain and back pain that been going on all day. Pt HX: COPD, HTN, current smoker EXAM: PORTABLE CHEST 1 VIEW COMPARISON:  07/25/2018 FINDINGS: The heart size and mediastinal contours are within normal limits. Both lungs are clear. The visualized skeletal structures are unremarkable. IMPRESSION: No active disease. Electronically Signed   By: Norva Pavlov M.D.   On: 08/03/2018 20:20    Procedures Procedures (including critical care time)  Medications Ordered in ED Medications - No data to display   Initial Impression / Assessment and Plan / ED Course  I have reviewed the triage vital signs and the nursing notes.  Pertinent labs & imaging results that were  available during my care of the patient were reviewed by me and considered in my medical decision making (see chart for details).        Patient presents to the emergency department complaining of chest pain and back pain, recent URI. She was tachycardic on ED arrival, resolved on repeat assessment. She has had multiple prior emergency department and  PCP visits for similar symptoms. Records reviewed in epic. Current presentation is not consistent with PE, dissection, pneumonia, CHF, ACS. Discussed with patient home care for atypical chest pain as well as itching, possible lice. Discussed outpatient follow-up and return precautions.  Final Clinical Impressions(s) / ED Diagnoses   Final diagnoses:  Atypical chest pain  Itching    ED Discharge Orders         Ordered    permethrin (ELIMITE) 5 % cream   Once     08/03/18 2135           Tilden Fossa, MD 08/03/18 2136

## 2019-02-22 IMAGING — DX DG CHEST 2V
2 series · 2 of 2 positions shown · non-contrast
Comparison: Chest x-ray of 01/20/2018

CLINICAL DATA: Chest pain, upper back pain for 3 days, cough

EXAM:
CHEST - 2 VIEW

[w chest pa]
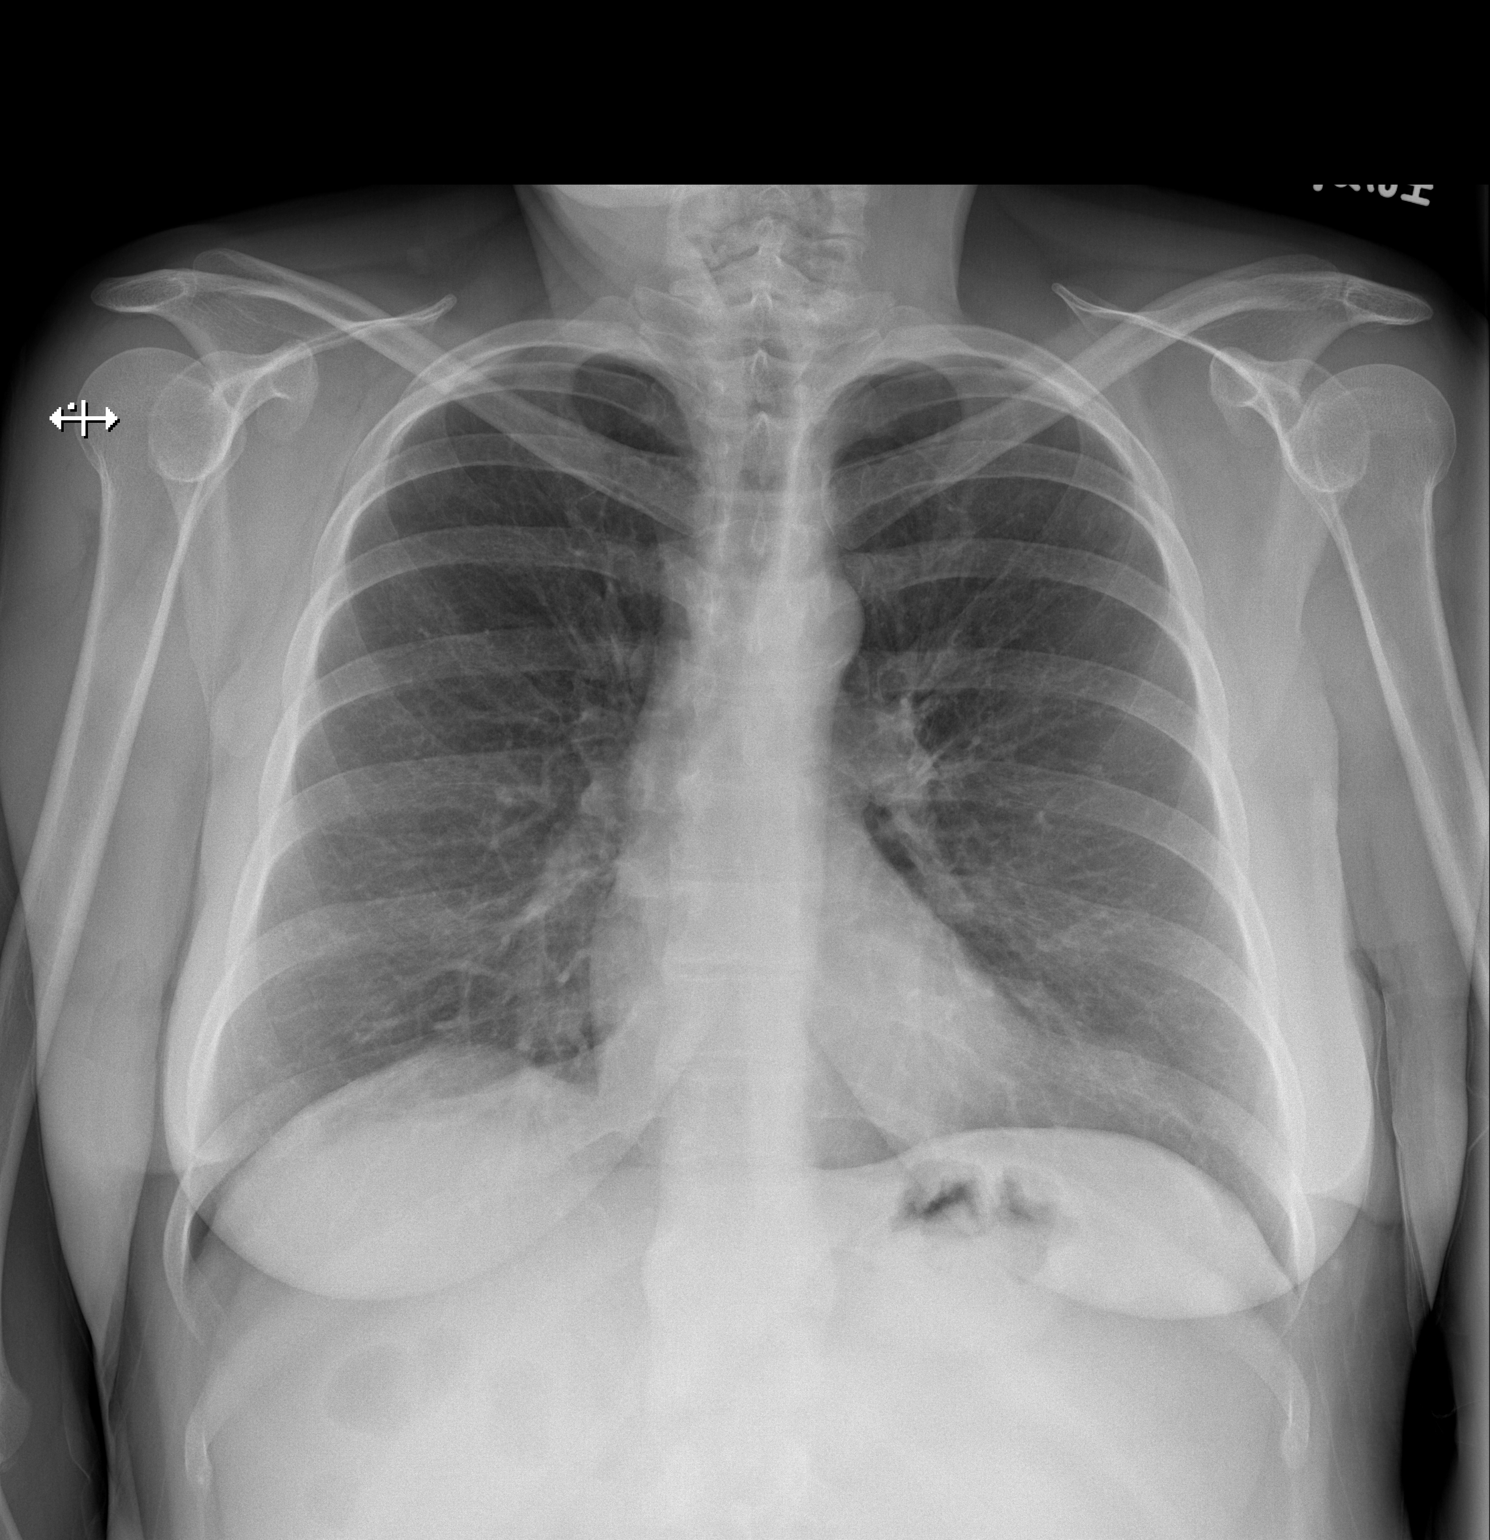

[w chest lat]
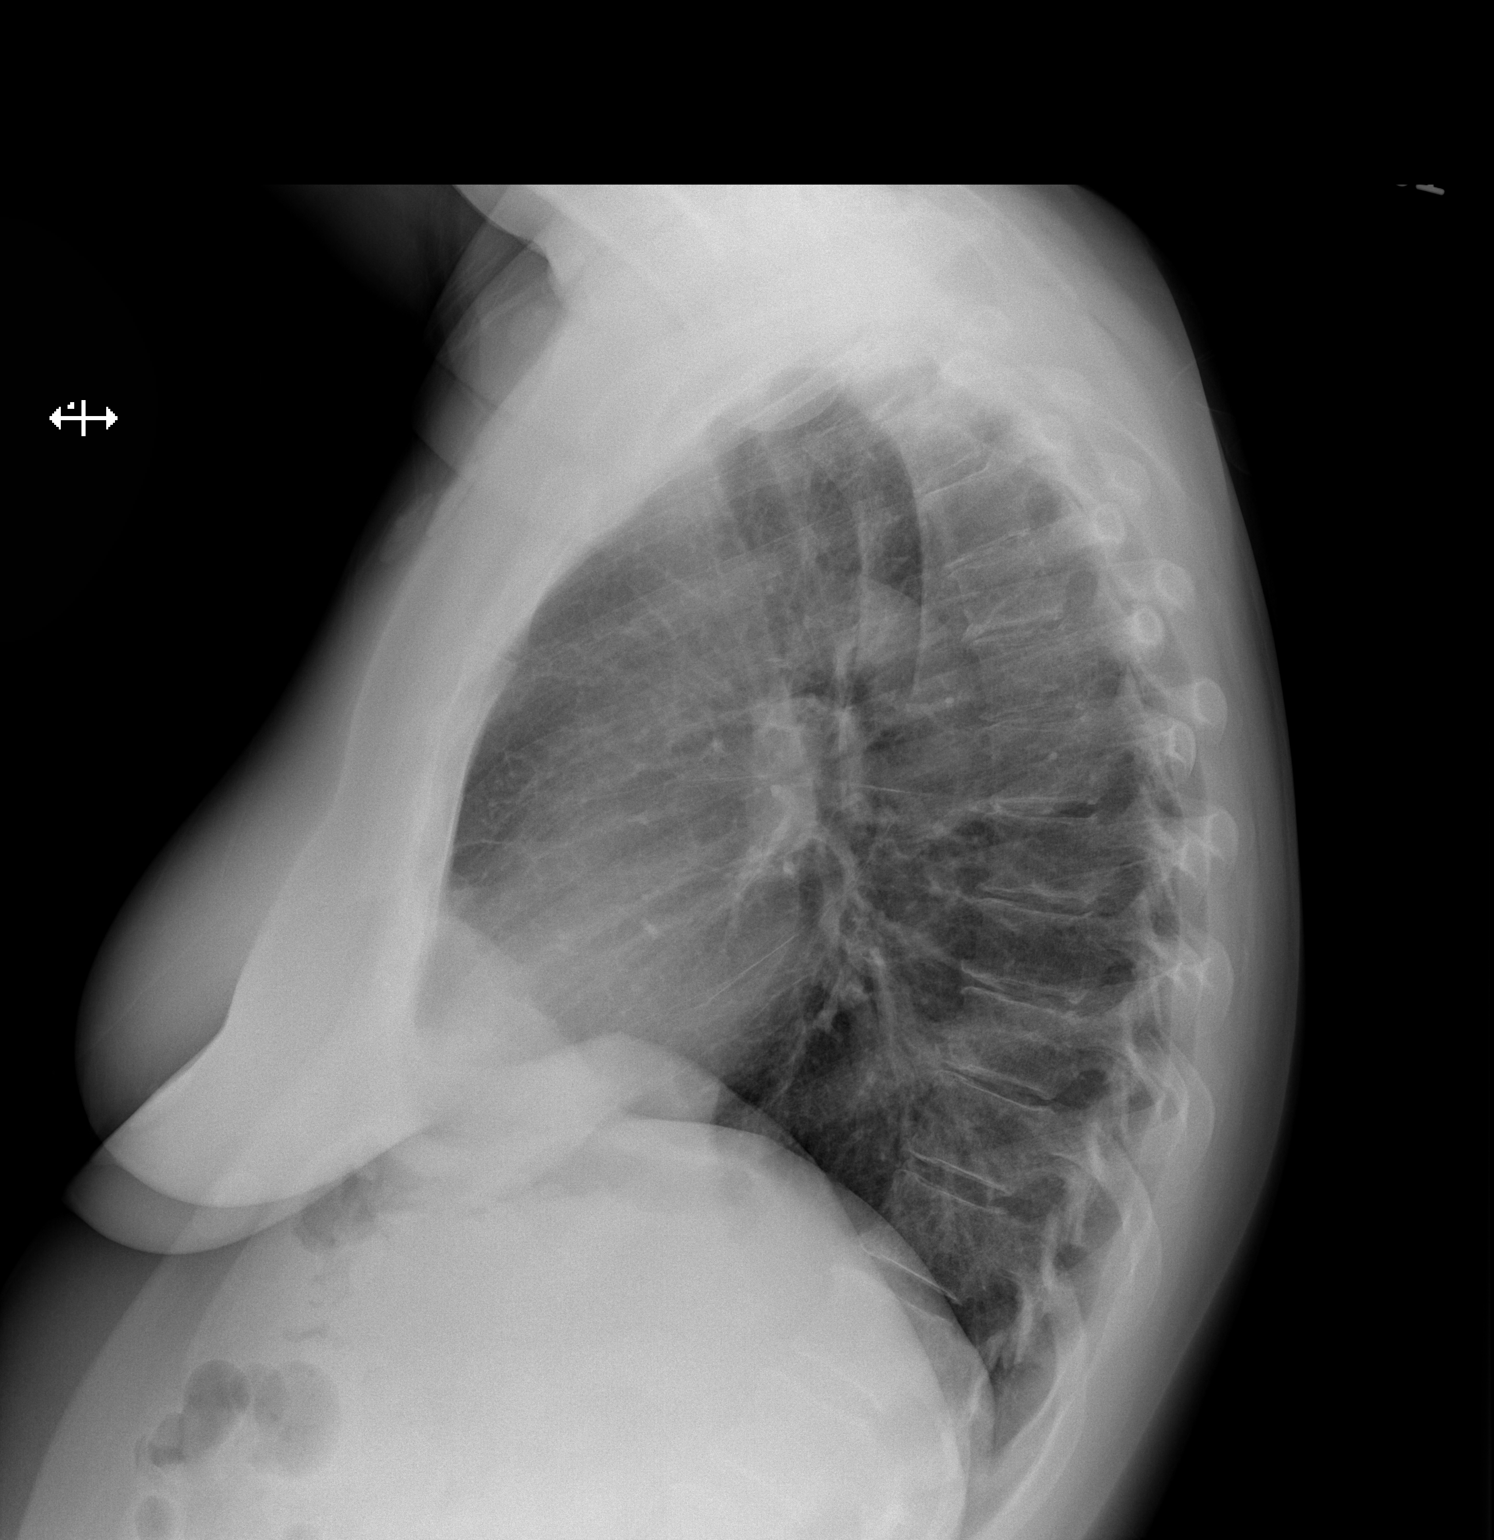

[2 of 2 positions shown; findings below may reference images not displayed]

FINDINGS: No active infiltrate or effusion is seen. Mediastinal and hilar
contours are unremarkable. The heart is within normal limits in
size. No acute bony abnormality is seen.
IMPRESSION: No active cardiopulmonary disease.

## 2019-03-21 ENCOUNTER — Other Ambulatory Visit: Payer: Self-pay

## 2019-03-21 ENCOUNTER — Encounter (HOSPITAL_COMMUNITY): Payer: Self-pay | Admitting: Emergency Medicine

## 2019-03-21 DIAGNOSIS — M542 Cervicalgia: Secondary | ICD-10-CM | POA: Diagnosis present

## 2019-03-21 DIAGNOSIS — M62838 Other muscle spasm: Secondary | ICD-10-CM | POA: Insufficient documentation

## 2019-03-21 DIAGNOSIS — F17210 Nicotine dependence, cigarettes, uncomplicated: Secondary | ICD-10-CM | POA: Diagnosis not present

## 2019-03-21 DIAGNOSIS — J449 Chronic obstructive pulmonary disease, unspecified: Secondary | ICD-10-CM | POA: Diagnosis not present

## 2019-03-21 DIAGNOSIS — I10 Essential (primary) hypertension: Secondary | ICD-10-CM | POA: Insufficient documentation

## 2019-03-21 DIAGNOSIS — Z79899 Other long term (current) drug therapy: Secondary | ICD-10-CM | POA: Insufficient documentation

## 2019-03-21 DIAGNOSIS — Z20828 Contact with and (suspected) exposure to other viral communicable diseases: Secondary | ICD-10-CM | POA: Insufficient documentation

## 2019-03-21 MED ORDER — LIDOCAINE 5 % EX PTCH
1.0000 | MEDICATED_PATCH | CUTANEOUS | Status: DC
  Filled 2019-03-21: qty 1

## 2019-03-21 MED ORDER — ACETAMINOPHEN 500 MG PO TABS
1000.0000 mg | ORAL_TABLET | Freq: Once | ORAL | Status: AC
  Administered 2019-03-22: 1000 mg via ORAL
  Filled 2019-03-21: qty 2

## 2019-03-21 MED ORDER — IBUPROFEN 800 MG PO TABS
800.0000 mg | ORAL_TABLET | Freq: Once | ORAL | Status: AC
  Administered 2019-03-22: 800 mg via ORAL
  Filled 2019-03-21: qty 1

## 2019-03-21 NOTE — ED Triage Notes (Signed)
Patient complaining of left arm pain and neck pain. Patient states that she dont what happened.

## 2019-03-22 ENCOUNTER — Emergency Department (HOSPITAL_COMMUNITY)
Admission: EM | Admit: 2019-03-22 | Discharge: 2019-03-23 | Disposition: A | Discharge status: Home / Self Care | Payer: Medicare Other | Source: Home / Self Care | Attending: Emergency Medicine | Admitting: Emergency Medicine

## 2019-03-22 ENCOUNTER — Emergency Department (HOSPITAL_COMMUNITY)
Admission: EM | Admit: 2019-03-22 | Discharge: 2019-03-22 | Disposition: A | Discharge status: Home / Self Care | Payer: Medicare Other | Attending: Emergency Medicine | Admitting: Emergency Medicine

## 2019-03-22 ENCOUNTER — Encounter (HOSPITAL_COMMUNITY): Payer: Self-pay | Admitting: Emergency Medicine

## 2019-03-22 ENCOUNTER — Other Ambulatory Visit: Payer: Self-pay

## 2019-03-22 DIAGNOSIS — M62838 Other muscle spasm: Secondary | ICD-10-CM | POA: Diagnosis not present

## 2019-03-22 DIAGNOSIS — I10 Essential (primary) hypertension: Secondary | ICD-10-CM | POA: Insufficient documentation

## 2019-03-22 DIAGNOSIS — X58XXXA Exposure to other specified factors, initial encounter: Secondary | ICD-10-CM | POA: Insufficient documentation

## 2019-03-22 DIAGNOSIS — Y998 Other external cause status: Secondary | ICD-10-CM | POA: Insufficient documentation

## 2019-03-22 DIAGNOSIS — S46912A Strain of unspecified muscle, fascia and tendon at shoulder and upper arm level, left arm, initial encounter: Secondary | ICD-10-CM | POA: Insufficient documentation

## 2019-03-22 DIAGNOSIS — Y929 Unspecified place or not applicable: Secondary | ICD-10-CM | POA: Insufficient documentation

## 2019-03-22 DIAGNOSIS — J449 Chronic obstructive pulmonary disease, unspecified: Secondary | ICD-10-CM | POA: Insufficient documentation

## 2019-03-22 DIAGNOSIS — F17210 Nicotine dependence, cigarettes, uncomplicated: Secondary | ICD-10-CM | POA: Insufficient documentation

## 2019-03-22 DIAGNOSIS — Z79899 Other long term (current) drug therapy: Secondary | ICD-10-CM | POA: Insufficient documentation

## 2019-03-22 DIAGNOSIS — Z20822 Contact with and (suspected) exposure to covid-19: Secondary | ICD-10-CM

## 2019-03-22 DIAGNOSIS — Z20828 Contact with and (suspected) exposure to other viral communicable diseases: Secondary | ICD-10-CM

## 2019-03-22 DIAGNOSIS — Y939 Activity, unspecified: Secondary | ICD-10-CM | POA: Insufficient documentation

## 2019-03-22 LAB — BASIC METABOLIC PANEL
Anion gap: 10 (ref 5–15)
BUN: 14 mg/dL (ref 6–20)
CO2: 24 mmol/L (ref 22–32)
Calcium: 9.1 mg/dL (ref 8.9–10.3)
Chloride: 105 mmol/L (ref 98–111)
Creatinine, Ser: 0.94 mg/dL (ref 0.44–1.00)
GFR calc Af Amer: >60 mL/min (ref >60)
GFR calc non Af Amer: >60 mL/min (ref >60)
Glucose, Bld: 115 mg/dL — ABNORMAL HIGH (ref 70–99)
Potassium: 4.1 mmol/L (ref 3.5–5.1)
Sodium: 139 mmol/L (ref 135–145)

## 2019-03-22 LAB — CBC
HCT: 43.8 % (ref 36.0–46.0)
Hemoglobin: 14.3 g/dL (ref 12.0–15.0)
MCH: 31.3 pg (ref 26.0–34.0)
MCHC: 32.6 g/dL (ref 30.0–36.0)
MCV: 95.8 fL (ref 80.0–100.0)
Platelets: 348 10*3/uL (ref 150–400)
RBC: 4.57 MIL/uL (ref 3.87–5.11)
RDW: 11.6 % (ref 11.5–15.5)
WBC: 13.9 10*3/uL — ABNORMAL HIGH (ref 4.0–10.5)
nRBC: 0 % (ref 0.0–0.2)

## 2019-03-22 MED ORDER — IBUPROFEN 800 MG PO TABS: 800.0000 mg | ORAL_TABLET | Freq: Three times a day (TID) | ORAL | 0 refills | Status: DC

## 2019-03-22 MED ORDER — METHOCARBAMOL 500 MG PO TABS
500.0000 mg | ORAL_TABLET | Freq: Once | ORAL | Status: AC
  Administered 2019-03-22: 500 mg via ORAL
  Filled 2019-03-22: qty 1

## 2019-03-22 NOTE — ED Provider Notes (Signed)
[facility]-EMERGENCY DEPT Provider Note   CSN: [id number] Arrival date & time: 03/21/19  2246     History   Chief Complaint Chief Complaint  Patient presents with  . Neck Pain  . Arm Pain    HPI Ashley Hodge is a 58 y.o. female.     The history is provided by the patient.  Neck Pain Pain location:  L side Quality:  Cramping Pain radiates to:  L shoulder Pain severity:  Moderate Pain is:  Same all the time Onset quality:  Sudden Timing:  Constant Chronicity:  New Context: not fall, not jumping from heights and not lifting a heavy object   Relieved by:  Nothing Worsened by:  Nothing Ineffective treatments:  None tried Associated symptoms: no bladder incontinence, no bowel incontinence, no chest pain, no fever, no headaches, no leg pain, no numbness, no paresis, no photophobia, no syncope, no tingling, no visual change, no weakness and no weight loss   Risk factors: no recent epidural   Arm Pain Pertinent negatives include no chest pain, no headaches and no shortness of breath.  Patient with pain with movement and palpation in the left neck shoulder junction.  She checked in to be seen with the rest of her family who accepted a ride from a person who cares for someone with covid.  No f/c/r.  No DOE. No SOB.  No CP.  No n/v/d.   Past Medical History:  Diagnosis Date  . Anxiety   . Asthma   . COPD (chronic obstructive pulmonary disease) (HCC)   . High cholesterol   . Hypertension     There are no active problems to display for this patient.   Past Surgical History:  Procedure Laterality Date  . TUBAL LIGATION       OB History    Gravida      Para      Term      Preterm      AB      Living  5     SAB      TAB      Ectopic      Multiple      Live Births               Home Medications    Prior to Admission medications   Medication Sig Start Date End Date Taking? Authorizing Provider  amLODipine (NORVASC) 5 MG  tablet Take 5 mg by mouth daily. 09/07/17  Yes [provider]  hydrochlorothiazide (HYDRODIURIL) 12.5 MG tablet Take 12.5 mg by mouth daily. 09/07/17  Yes [provider]  losartan (COZAAR) 50 MG tablet Take 50 mg by mouth daily. 09/07/17  Yes [provider]  pravastatin (PRAVACHOL) 80 MG tablet Take 80 mg by mouth daily. 03/16/18 07/04/19 Yes [provider]  albuterol (PROVENTIL HFA;VENTOLIN HFA) 108 (90 Base) MCG/ACT inhaler Inhale 1-2 puffs into the lungs every 6 (six) hours as needed for shortness of breath or wheezing. 08/02/18   [provider]  ibuprofen (ADVIL) 800 MG tablet Take 1 tablet (800 mg total) by mouth 3 (three) times daily. 03/22/19   Wilmer Santillo, MD    Family History Family History  Problem Relation Age of Onset  . Heart disease Mother   . Hypertension Mother     Social History Social History   Tobacco Use  . Smoking status: Current Every Day Smoker    Packs/day: 1.00    Types: Cigarettes  . Smokeless tobacco:  Never Used  Substance Use Topics  . Alcohol use: Not Currently    Frequency: Never    Comment: "clean x 4 yrs"  . Drug use: Never     Allergies   Lisinopril   Review of Systems Review of Systems  Constitutional: Negative for fever and weight loss.  HENT: Negative for congestion.   Eyes: Negative for photophobia.  Respiratory: Negative for cough and shortness of breath.   Cardiovascular: Negative for chest pain, palpitations, leg swelling and syncope.  Gastrointestinal: Negative for bowel incontinence.  Genitourinary: Negative for bladder incontinence and difficulty urinating.  Musculoskeletal: Positive for arthralgias and neck pain.  Skin: Negative for rash.  Neurological: Negative for tingling, weakness, numbness and headaches.  Psychiatric/Behavioral: Negative for agitation.  All other systems reviewed and are negative.    Physical Exam Updated Vital Signs BP 133/72 (BP Location: Left Arm)    Pulse 93   Temp 98.5 F (36.9 C) (Oral)   Resp 16   SpO2 99%   Physical Exam Vitals signs and nursing note reviewed.  Constitutional:      Appearance: Normal appearance.  HENT:     Head: Normocephalic and atraumatic.     Nose: Nose normal.     Mouth/Throat:     Mouth: Mucous membranes are moist.     Pharynx: Oropharynx is clear.  Eyes:     Conjunctiva/sclera: Conjunctivae normal.     Pupils: Pupils are equal, round, and reactive to light.  Neck:     Musculoskeletal: Normal range of motion and neck supple.  Cardiovascular:     Rate and Rhythm: Normal rate and regular rhythm.     Pulses: Normal pulses.     Heart sounds: Normal heart sounds.  Pulmonary:     Effort: Pulmonary effort is normal.     Breath sounds: Normal breath sounds.  Abdominal:     General: Abdomen is flat. Bowel sounds are normal.     Tenderness: There is no abdominal tenderness. There is no guarding or rebound.  Musculoskeletal: Normal range of motion.  Skin:    General: Skin is warm and dry.     Capillary Refill: Capillary refill takes less than 2 seconds.  Neurological:     General: No focal deficit present.     Mental Status: She is alert and oriented to person, place, and time.     Deep Tendon Reflexes: Reflexes normal.  Psychiatric:        Mood and Affect: Mood normal.        Behavior: Behavior normal.      ED Treatments / Results  Labs (all labs ordered are listed, but only abnormal results are displayed) Labs Reviewed  NOVEL CORONAVIRUS, NAA (HOSP ORDER, SEND-OUT TO REF LAB; TAT 18-24 HRS)    EKG None  Radiology No results found.  Procedures Procedures (including critical care time)  Medications Ordered in ED Medications  lidocaine (LIDODERM) 5 % 1 patch (1 patch Transdermal Not Given 03/22/19 0051)  methocarbamol (ROBAXIN) tablet 500 mg (has no administration in time range)  acetaminophen (TYLENOL) tablet 1,000 mg (1,000 mg Oral Given 03/22/19 0049)  ibuprofen (ADVIL) tablet 800  mg (800 mg Oral Given 03/22/19 0049)     Initial Impression / Assessment and Plan / ED Course  MSK pain, patient has palpable spasm in the left neck and shoulder area.  She was covid tested with the rest of her family and strict home isolation guidelines were given verbally and printed on her discharge papers.  Strict return precautions given.      Ashley Hodge was evaluated in Emergency Department on 03/22/2019 for the symptoms described in the history of present illness. She was evaluated in the context of the global COVID-19 pandemic, which necessitated consideration that the patient might be at risk for infection with the SARS-CoV-2 virus that causes COVID-19. Institutional protocols and algorithms that pertain to the evaluation of patients at risk for COVID-19 are in a state of rapid change based on information released by regulatory bodies including the CDC and federal and state organizations. These policies and algorithms were followed during the patient's care in the ED.  Final Clinical Impressions(s) / ED Diagnoses   Final diagnoses:  Muscle spasm  Person under investigation for COVID-19   Return for weakness, numbness, changes in vision or speech, fevers >100.4 unrelieved by medication, shortness of breath, intractable vomiting, or diarrhea, abdominal pain, Inability to tolerate liquids or food, cough, altered mental status or any concerns. No signs of systemic illness or infection. The patient is nontoxic-appearing on exam and vital signs are within normal limits.   I have reviewed the triage vital signs and the nursing notes. Pertinent labs &imaging results that were available during my care of the patient were reviewed by me and considered in my medical decision making (see chart for details).  After history, exam, and medical workup I feel the patient has been appropriately medically screened and is safe for discharge home. Pertinent diagnoses were discussed with the patient.  Patient was given return precautions  ED Discharge Orders         Ordered    ibuprofen (ADVIL) 800 MG tablet  3 times daily     03/22/19 0202           Riona Lahti, MD 03/22/19 0209

## 2019-03-22 NOTE — ED Triage Notes (Signed)
Pt c/o left flank and let arm pain.

## 2019-03-22 NOTE — Discharge Instructions (Signed)
Person Under Monitoring Name: Ashley Hodge  Location: [facility] [place] [address]   Infection Prevention Recommendations for Individuals Confirmed to have, or Being Evaluated for, 2019 Novel Coronavirus (COVID-19) Infection Who Receive Care at Home  Individuals who are confirmed to have, or are being evaluated for, COVID-19 should follow the prevention steps below until a healthcare provider or local or state health department says they can return to normal activities.  Stay home except to get medical care You should restrict activities outside your home, except for getting medical care. Do not go to work, school, or public areas, and do not use public transportation or taxis.  Call ahead before visiting your doctor Before your medical appointment, call the healthcare provider and tell them that you have, or are being evaluated for, COVID-19 infection. This will help the healthcare providers office take steps to keep other people from getting infected. Ask your healthcare provider to call the local or state health department.  Monitor your symptoms Seek prompt medical attention if your illness is worsening (e.g., difficulty breathing). Before going to your medical appointment, call the healthcare provider and tell them that you have, or are being evaluated for, COVID-19 infection. Ask your healthcare provider to call the local or state health department.  Wear a facemask You should wear a facemask that covers your nose and mouth when you are in the same room with other people and when you visit a healthcare provider. People who live with or visit you should also wear a facemask while they are in the same room with you.  Separate yourself from other people in your home As much as possible, you should stay in a different room from other people in your home. Also, you should use a separate bathroom, if available.  Avoid sharing household items You should not share dishes,  drinking glasses, cups, eating utensils, towels, bedding, or other items with other people in your home. After using these items, you should wash them thoroughly with soap and water.  Cover your coughs and sneezes Cover your mouth and nose with a tissue when you cough or sneeze, or you can cough or sneeze into your sleeve. Throw used tissues in a lined trash can, and immediately wash your hands with soap and water for at least 20 seconds or use an alcohol-based hand rub.  Wash your [employer] your hands often and thoroughly with soap and water for at least 20 seconds. You can use an alcohol-based hand sanitizer if soap and water are not available and if your hands are not visibly dirty. Avoid touching your eyes, nose, and mouth with unwashed hands.   Prevention Steps for Caregivers and Household Members of Individuals Confirmed to have, or Being Evaluated for, COVID-19 Infection Being Cared for in the Home  If you live with, or provide care at home for, a person confirmed to have, or being evaluated for, COVID-19 infection please follow these guidelines to prevent infection:  Follow healthcare providers instructions Make sure that you understand and can help the patient follow any healthcare provider instructions for all care.  Provide for the patients basic needs You should help the patient with basic needs in the home and provide support for getting groceries, prescriptions, and other personal needs.  Monitor the patients symptoms If they are getting sicker, call his or her medical provider and tell them that the patient has, or is being evaluated for, COVID-19 infection. This will help the healthcare providers office take steps to  keep other people from getting infected. Ask the healthcare provider to call the local or state health department.  Limit the number of people who have contact with the patient If possible, have only one caregiver for the patient. Other household  members should stay in another home or place of residence. If this is not possible, they should stay in another room, or be separated from the patient as much as possible. Use a separate bathroom, if available. Restrict visitors who do not have an essential need to be in the home.  Keep older adults, very young children, and other sick people away from the patient Keep older adults, very young children, and those who have compromised immune systems or chronic health conditions away from the patient. This includes people with chronic heart, lung, or kidney conditions, diabetes, and cancer.  Ensure good ventilation Make sure that shared spaces in the home have good air flow, such as from an air conditioner or an opened window, weather permitting.  Wash your hands often Wash your hands often and thoroughly with soap and water for at least 20 seconds. You can use an alcohol based hand sanitizer if soap and water are not available and if your hands are not visibly dirty. Avoid touching your eyes, nose, and mouth with unwashed hands. Use disposable paper towels to dry your hands. If not available, use dedicated cloth towels and replace them when they become wet.  Wear a facemask and gloves Wear a disposable facemask at all times in the room and gloves when you touch or have contact with the patients blood, body fluids, and/or secretions or excretions, such as sweat, saliva, sputum, nasal mucus, vomit, urine, or feces.  Ensure the mask fits over your nose and mouth tightly, and do not touch it during use. Throw out disposable facemasks and gloves after using them. Do not reuse. Wash your hands immediately after removing your facemask and gloves. If your personal clothing becomes contaminated, carefully remove clothing and launder. Wash your hands after handling contaminated clothing. Place all used disposable facemasks, gloves, and other waste in a lined container before disposing them with other  household waste. Remove gloves and wash your hands immediately after handling these items.  Do not share dishes, glasses, or other household items with the patient Avoid sharing household items. You should not share dishes, drinking glasses, cups, eating utensils, towels, bedding, or other items with a patient who is confirmed to have, or being evaluated for, COVID-19 infection. After the person uses these items, you should wash them thoroughly with soap and water.  Wash laundry thoroughly Immediately remove and wash clothes or bedding that have blood, body fluids, and/or secretions or excretions, such as sweat, saliva, sputum, nasal mucus, vomit, urine, or feces, on them. Wear gloves when handling laundry from the patient. Read and follow directions on labels of laundry or clothing items and detergent. In general, wash and dry with the warmest temperatures recommended on the label.  Clean all areas the individual has used often Clean all touchable surfaces, such as counters, tabletops, doorknobs, bathroom fixtures, toilets, phones, keyboards, tablets, and bedside tables, every day. Also, clean any surfaces that may have blood, body fluids, and/or secretions or excretions on them. Wear gloves when cleaning surfaces the patient has come in contact with. Use a diluted bleach solution (e.g., dilute bleach with 1 part bleach and 10 parts water) or a household disinfectant with a label that says EPA-registered for coronaviruses. To make a bleach solution at home,  add 1 tablespoon of bleach to 1 quart (4 cups) of water. For a larger supply, add  cup of bleach to 1 gallon (16 cups) of water. Read labels of cleaning products and follow recommendations provided on product labels. Labels contain instructions for safe and effective use of the cleaning product including precautions you should take when applying the product, such as wearing gloves or eye protection and making sure you have good ventilation  during use of the product. Remove gloves and wash hands immediately after cleaning.  Monitor yourself for signs and symptoms of illness Caregivers and household members are considered close contacts, should monitor their health, and will be asked to limit movement outside of the home to the extent possible. Follow the monitoring steps for close contacts listed on the symptom monitoring form.   ? If you have additional questions, contact your local health department or call the epidemiologist on call at [phone] (available 24/7). ? This guidance is subject to change. For the most up-to-date guidance from [facility], please refer to their website: TripMetro.huhttps://www.cdc.gov/coronavirus/2019-ncov/hcp/guidance-prevent-spread.html

## 2019-03-23 LAB — NOVEL CORONAVIRUS, NAA (HOSP ORDER, SEND-OUT TO REF LAB; TAT 18-24 HRS): SARS-CoV-2, NAA: NOT DETECTED

## 2019-03-23 NOTE — Discharge Instructions (Signed)
You may use over-the-counter Motrin (Ibuprofen), Acetaminophen (Tylenol), topical muscle creams such as SalonPas, Icy Hot, Bengay, etc. Please stretch, apply heat, and have massage therapy for additional assistance. ° °

## 2019-03-23 NOTE — ED Provider Notes (Signed)
[facility] EMERGENCY DEPARTMENT Provider Note  CSN: [id number] Arrival date & time: 03/22/19 2150  Chief Complaint(s) Flank Pain (LEFT SIDE)  HPI Ashley Hodge is a 58 y.o. female who presents to the emergency department with 2 days of left shoulder girdle muscle pain exacerbated with movement and palpation.  No alleviating factors.  Patient was seen at [facility] long yesterday for the same and recommended Motrin for pain.  Patient denies any acute changes.  No shortness of breath or chest pain.  No nausea vomiting.  Denies any other physical complaints.  HPI  Past Medical History Past Medical History:  Diagnosis Date   Anxiety    Asthma    COPD (chronic obstructive pulmonary disease) (HCC)    High cholesterol    Hypertension    There are no active problems to display for this patient.  Home Medication(s) Prior to Admission medications   Medication Sig Start Date End Date Taking? Authorizing Provider  albuterol (PROVENTIL HFA;VENTOLIN HFA) 108 (90 Base) MCG/ACT inhaler Inhale 1-2 puffs into the lungs every 6 (six) hours as needed for shortness of breath or wheezing. 08/02/18   [provider]  amLODipine (NORVASC) 5 MG tablet Take 5 mg by mouth daily. 09/07/17   [provider]  hydrochlorothiazide (HYDRODIURIL) 12.5 MG tablet Take 12.5 mg by mouth daily. 09/07/17   [provider]  ibuprofen (ADVIL) 800 MG tablet Take 1 tablet (800 mg total) by mouth 3 (three) times daily. 03/22/19   Palumbo, April, MD  losartan (COZAAR) 50 MG tablet Take 50 mg by mouth daily. 09/07/17   [provider]  pravastatin (PRAVACHOL) 80 MG tablet Take 80 mg by mouth daily. 03/16/18 07/04/19  [provider]                                                                                                                                    Past Surgical History Past Surgical History:  Procedure Laterality Date   TUBAL LIGATION     Family  History Family History  Problem Relation Age of Onset   Heart disease Mother    Hypertension Mother     Social History Social History   Tobacco Use   Smoking status: Current Every Day Smoker    Packs/day: 1.00    Types: Cigarettes   Smokeless tobacco: Never Used  Substance Use Topics   Alcohol use: Not Currently    Frequency: Never    Comment: "clean x 4 yrs"   Drug use: Never   Allergies Lisinopril  Review of Systems Review of Systems All other systems are reviewed and are negative for acute change except as noted in the HPI  Physical Exam Vital Signs  I have reviewed the triage vital signs BP 114/64    Pulse 95    Temp 98.2 F (36.8 C) (Oral)    Resp 18    SpO2 95%   Physical Exam  Vitals signs reviewed.  Constitutional:      General: She is not in acute distress.    Appearance: She is well-developed. She is not diaphoretic.  HENT:     Head: Normocephalic and atraumatic.     Nose: Nose normal.  Eyes:     General: No scleral icterus.       Right eye: No discharge.        Left eye: No discharge.     Conjunctiva/sclera: Conjunctivae normal.     Pupils: Pupils are equal, round, and reactive to light.  Neck:     Musculoskeletal: Normal range of motion and neck supple.  Cardiovascular:     Rate and Rhythm: Normal rate and regular rhythm.     Heart sounds: No murmur. No friction rub. No gallop.   Pulmonary:     Effort: Pulmonary effort is normal. No respiratory distress.     Breath sounds: Normal breath sounds. No stridor. No rales.  Abdominal:     General: There is no distension.     Palpations: Abdomen is soft.     Tenderness: There is no abdominal tenderness.  Musculoskeletal:     Cervical back: She exhibits tenderness and spasm. She exhibits no bony tenderness.       Back:  Skin:    General: Skin is warm and dry.     Findings: No erythema or rash.  Neurological:     Mental Status: She is alert and oriented to person, place, and time.     ED  Results and Treatments Labs (all labs ordered are listed, but only abnormal results are displayed) Labs Reviewed  BASIC METABOLIC PANEL - Abnormal; Notable for the following components:      Result Value   Glucose, Bld 115 (*)    All other components within normal limits  CBC - Abnormal; Notable for the following components:   WBC 13.9 (*)    All other components within normal limits  URINALYSIS, ROUTINE W REFLEX MICROSCOPIC                                                                                                                         EKG  EKG Interpretation  Date/Time:    Ventricular Rate:    PR Interval:    QRS Duration:   QT Interval:    QTC Calculation:   R Axis:     Text Interpretation:        Radiology No results found.  Pertinent labs & imaging results that were available during my care of the patient were reviewed by me and considered in my medical decision making (see chart for details).  Medications Ordered in ED Medications - No data to display  Procedures Procedures  (including critical care time)  Medical Decision Making / ED Course I have reviewed the nursing notes for this encounter and the patient's prior records (if available in EHR or on provided paperwork).   Ashley Hodge was evaluated in Emergency Department on 03/23/2019 for the symptoms described in the history of present illness. She was evaluated in the context of the global COVID-19 pandemic, which necessitated consideration that the patient might be at risk for infection with the SARS-CoV-2 virus that causes COVID-19. Institutional protocols and algorithms that pertain to the evaluation of patients at risk for COVID-19 are in a state of rapid change based on information released by regulatory bodies including the CDC and federal and state organizations. These  policies and algorithms were followed during the patient's care in the ED.  Consistent with muscle strain/spasm of the left shoulder girdle muscles.  Patient denies any acute trauma.  No shortness of breath and equal bilateral breath sounds.  No associated chest pain.  Doubt PE, pneumothorax, pneumonia.  Reiterated use of NSAIDs.  Also recommended topical muscle creams, stretching, massages and heat application.  The patient appears reasonably screened and/or stabilized for discharge and I doubt any other medical condition or other [facility] requiring further screening, evaluation, or treatment in the ED at this time prior to discharge.  The patient is safe for discharge with strict return precautions.        Final Clinical Impression(s) / ED Diagnoses Final diagnoses:  Muscle strain of left shoulder region, initial encounter     The patient appears reasonably screened and/or stabilized for discharge and I doubt any other medical condition or other [facility] requiring further screening, evaluation, or treatment in the ED at this time prior to discharge.  Disposition: Discharge  Condition: Good  I have discussed the results, Dx and Tx plan with the patient who expressed understanding and agree(s) with the plan. Discharge instructions discussed at great length. The patient was given strict return precautions who verbalized understanding of the instructions. No further questions at time of discharge.    ED Discharge Orders    None       Follow Up: Levin Bacon, NP [address] [place] [address] [phone]  Schedule an appointment as soon as possible for a visit       This chart was dictated using voice recognition software.  Despite best efforts to proofread,  errors can occur which can change the documentation meaning.   Fatima Blank, MD 03/23/19 [phone]

## 2019-04-17 ENCOUNTER — Encounter (HOSPITAL_COMMUNITY): Payer: Self-pay | Admitting: Emergency Medicine

## 2019-04-17 ENCOUNTER — Other Ambulatory Visit: Payer: Self-pay

## 2019-04-17 ENCOUNTER — Emergency Department (HOSPITAL_COMMUNITY)
Admission: EM | Admit: 2019-04-17 | Discharge: 2019-04-17 | Disposition: A | Discharge status: Home / Self Care | Payer: Medicare Other | Attending: Emergency Medicine | Admitting: Emergency Medicine

## 2019-04-17 DIAGNOSIS — Y999 Unspecified external cause status: Secondary | ICD-10-CM | POA: Insufficient documentation

## 2019-04-17 DIAGNOSIS — M25512 Pain in left shoulder: Secondary | ICD-10-CM | POA: Diagnosis not present

## 2019-04-17 DIAGNOSIS — Y939 Activity, unspecified: Secondary | ICD-10-CM | POA: Insufficient documentation

## 2019-04-17 DIAGNOSIS — S199XXA Unspecified injury of neck, initial encounter: Secondary | ICD-10-CM | POA: Diagnosis present

## 2019-04-17 DIAGNOSIS — F17210 Nicotine dependence, cigarettes, uncomplicated: Secondary | ICD-10-CM | POA: Diagnosis not present

## 2019-04-17 DIAGNOSIS — X58XXXA Exposure to other specified factors, initial encounter: Secondary | ICD-10-CM | POA: Insufficient documentation

## 2019-04-17 DIAGNOSIS — Y929 Unspecified place or not applicable: Secondary | ICD-10-CM | POA: Diagnosis not present

## 2019-04-17 DIAGNOSIS — Z79899 Other long term (current) drug therapy: Secondary | ICD-10-CM | POA: Insufficient documentation

## 2019-04-17 DIAGNOSIS — I10 Essential (primary) hypertension: Secondary | ICD-10-CM | POA: Diagnosis not present

## 2019-04-17 DIAGNOSIS — S161XXA Strain of muscle, fascia and tendon at neck level, initial encounter: Secondary | ICD-10-CM | POA: Diagnosis not present

## 2019-04-17 DIAGNOSIS — J449 Chronic obstructive pulmonary disease, unspecified: Secondary | ICD-10-CM | POA: Insufficient documentation

## 2019-04-17 MED ORDER — TRAMADOL HCL 50 MG PO TABS: 50.0000 mg | ORAL_TABLET | Freq: Four times a day (QID) | ORAL | 0 refills | Status: DC | PRN

## 2019-04-17 MED ORDER — PREDNISONE 50 MG PO TABS: 50.0000 mg | ORAL_TABLET | Freq: Every day | ORAL | 0 refills | Status: DC

## 2019-04-17 NOTE — ED Triage Notes (Signed)
Pt c/o neck pains and left arm pains that started earlier that comes and goes.

## 2019-04-17 NOTE — ED Provider Notes (Signed)
[place] DEPT Provider Note   CSN: [id number] Arrival date & time: 04/17/19  1214     History   Chief Complaint Chief Complaint  Patient presents with  . Neck Pain  . Arm Pain    HPI Ashley Hodge is a 58 y.o. female.     HPI Patient presents to the emergency department with left-sided neck pain that radiates to the shoulder.  The patient states that started earlier but she is had this type of pain in the past.  The patient states nothing seems to make the condition better or worse.  She states that her pain is not as bad at this point.  But was better earlier.  Patient states that she did not have any numbness, weakness, dizziness, headache, blurred vision, chest pain, shortness of breath, nausea, vomiting, fever or syncope. Past Medical History:  Diagnosis Date  . Anxiety   . Asthma   . COPD (chronic obstructive pulmonary disease) ([place])   . High cholesterol   . Hypertension     There are no active problems to display for this patient.   Past Surgical History:  Procedure Laterality Date  . TUBAL LIGATION       OB History    Gravida      Para      Term      Preterm      AB      Living  5     SAB      TAB      Ectopic      Multiple      Live Births               Home Medications    Prior to Admission medications   Medication Sig Start Date End Date Taking? Authorizing Provider  amLODipine (NORVASC) 5 MG tablet Take 5 mg by mouth daily. 09/07/17  Yes [provider]  hydrochlorothiazide (HYDRODIURIL) 12.5 MG tablet Take 12.5 mg by mouth daily. 09/07/17  Yes [provider]  ibuprofen (ADVIL) 800 MG tablet Take 1 tablet (800 mg total) by mouth 3 (three) times daily. 03/22/19  Yes Palumbo, April, MD  losartan (COZAAR) 50 MG tablet Take 50 mg by mouth daily. 09/07/17  Yes [provider]  omeprazole (PRILOSEC) 20 MG capsule Take 20 mg by mouth daily. 04/02/19  Yes [provider]   pravastatin (PRAVACHOL) 80 MG tablet Take 80 mg by mouth daily. 03/16/18 07/04/19 Yes [provider]  albuterol (PROVENTIL HFA;VENTOLIN HFA) 108 (90 Base) MCG/ACT inhaler Inhale 1-2 puffs into the lungs every 6 (six) hours as needed for shortness of breath or wheezing. 08/02/18   [provider]  predniSONE (DELTASONE) 50 MG tablet Take 1 tablet (50 mg total) by mouth daily. 04/17/19   Ashland Osmer, Harrell Gave, PA-C  traMADol (ULTRAM) 50 MG tablet Take 1 tablet (50 mg total) by mouth every 6 (six) hours as needed for severe pain. 04/17/19   Dalia Heading, PA-C    Family History Family History  Problem Relation Age of Onset  . Heart disease Mother   . Hypertension Mother     Social History Social History   Tobacco Use  . Smoking status: Current Every Day Smoker    Packs/day: 1.00    Types: Cigarettes  . Smokeless tobacco: Never Used  Substance Use Topics  . Alcohol use: Not Currently    Frequency: Never    Comment: "clean x 4 yrs"  . Drug use: Never  Allergies   Lisinopril   Review of Systems Review of Systems All other systems negative except as documented in the HPI. All pertinent positives and negatives as reviewed in the HPI.  Physical Exam Updated Vital Signs BP 119/79 (BP Location: Left Arm)   Pulse 94   Temp 98.6 F (37 C) (Oral)   Resp 16   SpO2 99%   Physical Exam Vitals signs and nursing note reviewed.  Constitutional:      General: She is not in acute distress.    Appearance: She is well-developed.  HENT:     Head: Normocephalic and atraumatic.  Eyes:     Pupils: Pupils are equal, round, and reactive to light.  Neck:     Musculoskeletal: Normal range of motion and neck supple.   Cardiovascular:     Rate and Rhythm: Normal rate and regular rhythm.     Heart sounds: Normal heart sounds. No murmur. No friction rub. No gallop.   Pulmonary:     Effort: Pulmonary effort is normal. No respiratory distress.     Breath sounds: Normal  breath sounds. No wheezing.  Abdominal:     General: Bowel sounds are normal. There is no distension.     Palpations: Abdomen is soft.     Tenderness: There is no abdominal tenderness.  Skin:    General: Skin is warm and dry.     Capillary Refill: Capillary refill takes less than 2 seconds.     Findings: No erythema or rash.  Neurological:     Mental Status: She is alert and oriented to person, place, and time.     Motor: No abnormal muscle tone.     Coordination: Coordination normal.  Psychiatric:        Behavior: Behavior normal.      ED Treatments / Results  Labs (all labs ordered are listed, but only abnormal results are displayed) Labs Reviewed - No data to display  EKG None  Radiology No results found.  Procedures Procedures (including critical care time)  Medications Ordered in ED Medications - No data to display   Initial Impression / Assessment and Plan / ED Course  I have reviewed the triage vital signs and the nursing notes.  Pertinent labs & imaging results that were available during my care of the patient were reviewed by me and considered in my medical decision making (see chart for details).       The patient is advised to return here as needed.  At this point she does not have much neck pain and no neuro deficits.  The patient is advised to return here as needed.  Final Clinical Impressions(s) / ED Diagnoses   Final diagnoses:  Strain of neck muscle, initial encounter    ED Discharge Orders         Ordered    predniSONE (DELTASONE) 50 MG tablet  Daily     04/17/19 1444    traMADol (ULTRAM) 50 MG tablet  Every 6 hours PRN     04/17/19 1444           Charlestine Night, PA-C 04/17/19 1454    Milagros Loll, MD 04/19/19 [phone]

## 2019-04-17 NOTE — Discharge Instructions (Addendum)
Return here as needed.  Follow-up with your doctor for recheck.  Use ice and heat on your neck.

## 2019-04-17 NOTE — ED Notes (Signed)
Patient ambulated out of room and has asked RN how much longer patient will be in ED due to her family member not keeping mask on. RN has notified ED Provider.

## 2019-04-17 NOTE — ED Notes (Signed)
Patient does not want vitals and wishes to leave ASAP. Patient is now leaving in electric mobility scooter however patient ambulated out of room twice.

## 2019-04-17 NOTE — ED Notes (Signed)
Patient has urine sample in triage

## 2019-08-01 ENCOUNTER — Emergency Department (HOSPITAL_COMMUNITY): Payer: Medicare Other

## 2019-08-01 ENCOUNTER — Encounter (HOSPITAL_COMMUNITY): Payer: Self-pay

## 2019-08-01 ENCOUNTER — Other Ambulatory Visit: Payer: Self-pay

## 2019-08-01 ENCOUNTER — Emergency Department (HOSPITAL_COMMUNITY)
Admission: EM | Admit: 2019-08-01 | Discharge: 2019-08-01 | Disposition: A | Discharge status: Home / Self Care | Payer: Medicare Other | Attending: Emergency Medicine | Admitting: Emergency Medicine

## 2019-08-01 DIAGNOSIS — F17210 Nicotine dependence, cigarettes, uncomplicated: Secondary | ICD-10-CM | POA: Diagnosis not present

## 2019-08-01 DIAGNOSIS — Z79899 Other long term (current) drug therapy: Secondary | ICD-10-CM | POA: Insufficient documentation

## 2019-08-01 DIAGNOSIS — I10 Essential (primary) hypertension: Secondary | ICD-10-CM | POA: Insufficient documentation

## 2019-08-01 DIAGNOSIS — R0789 Other chest pain: Secondary | ICD-10-CM | POA: Diagnosis not present

## 2019-08-01 DIAGNOSIS — J45909 Unspecified asthma, uncomplicated: Secondary | ICD-10-CM | POA: Diagnosis not present

## 2019-08-01 DIAGNOSIS — J449 Chronic obstructive pulmonary disease, unspecified: Secondary | ICD-10-CM | POA: Insufficient documentation

## 2019-08-01 DIAGNOSIS — R079 Chest pain, unspecified: Secondary | ICD-10-CM | POA: Diagnosis present

## 2019-08-01 LAB — COMPREHENSIVE METABOLIC PANEL
ALT: 17 U/L (ref 0–44)
AST: 20 U/L (ref 15–41)
Albumin: 4.1 g/dL (ref 3.5–5.0)
Alkaline Phosphatase: 98 U/L (ref 38–126)
Anion gap: 9 (ref 5–15)
BUN: 16 mg/dL (ref 6–20)
CO2: 28 mmol/L (ref 22–32)
Calcium: 9 mg/dL (ref 8.9–10.3)
Chloride: 105 mmol/L (ref 98–111)
Creatinine, Ser: 1.07 mg/dL — ABNORMAL HIGH (ref 0.44–1.00)
GFR calc Af Amer: >60 mL/min (ref >60)
GFR calc non Af Amer: 57 mL/min — ABNORMAL LOW (ref >60)
Glucose, Bld: 130 mg/dL — ABNORMAL HIGH (ref 70–99)
Potassium: 4.1 mmol/L (ref 3.5–5.1)
Sodium: 142 mmol/L (ref 135–145)
Total Bilirubin: 0.5 mg/dL (ref 0.3–1.2)
Total Protein: 7.5 g/dL (ref 6.5–8.1)

## 2019-08-01 LAB — CBC WITH DIFFERENTIAL/PLATELET
Abs Immature Granulocytes: 0.03 10*3/uL (ref 0.00–0.07)
Basophils Absolute: 0.1 10*3/uL (ref 0.0–0.1)
Basophils Relative: 1 %
Eosinophils Absolute: 0.5 10*3/uL (ref 0.0–0.5)
Eosinophils Relative: 4 %
HCT: 43.8 % (ref 36.0–46.0)
Hemoglobin: 14.3 g/dL (ref 12.0–15.0)
Immature Granulocytes: 0 %
Lymphocytes Relative: 24 %
Lymphs Abs: 3 10*3/uL (ref 0.7–4.0)
MCH: 31.4 pg (ref 26.0–34.0)
MCHC: 32.6 g/dL (ref 30.0–36.0)
MCV: 96.3 fL (ref 80.0–100.0)
Monocytes Absolute: 0.8 10*3/uL (ref 0.1–1.0)
Monocytes Relative: 7 %
Neutro Abs: 8.2 10*3/uL — ABNORMAL HIGH (ref 1.7–7.7)
Neutrophils Relative %: 64 %
Platelets: 311 10*3/uL (ref 150–400)
RBC: 4.55 MIL/uL (ref 3.87–5.11)
RDW: 12.1 % (ref 11.5–15.5)
WBC: 12.7 10*3/uL — ABNORMAL HIGH (ref 4.0–10.5)
nRBC: 0 % (ref 0.0–0.2)

## 2019-08-01 LAB — TROPONIN I (HIGH SENSITIVITY): Troponin I (High Sensitivity): <2 ng/L (ref <18)

## 2019-08-01 MED ORDER — NAPROXEN 375 MG PO TABS: 375.0000 mg | ORAL_TABLET | Freq: Two times a day (BID) | ORAL | 0 refills | Status: DC

## 2019-08-01 MED ORDER — CYCLOBENZAPRINE HCL 10 MG PO TABS
5.0000 mg | ORAL_TABLET | Freq: Once | ORAL | Status: AC
  Administered 2019-08-01: 5 mg via ORAL
  Filled 2019-08-01: qty 1

## 2019-08-01 MED ORDER — NAPROXEN 375 MG PO TABS
375.0000 mg | ORAL_TABLET | Freq: Once | ORAL | Status: AC
  Administered 2019-08-01: 375 mg via ORAL
  Filled 2019-08-01: qty 1

## 2019-08-01 MED ORDER — CYCLOBENZAPRINE HCL 10 MG PO TABS: 10.0000 mg | ORAL_TABLET | Freq: Two times a day (BID) | ORAL | 0 refills | Status: DC | PRN

## 2019-08-01 NOTE — Discharge Instructions (Addendum)
Take the medications as needed for pain, follow up with a primary care doctor if not improving

## 2019-08-01 NOTE — ED Triage Notes (Signed)
Arrived POV from home. Patient reports SHOB and left side pain. Patient states "I have asthma and smoke, and I'm supposed to be in a wheel chair but I've been walking around and it makes my SHOB worse".

## 2019-08-01 NOTE — ED Provider Notes (Signed)
[place] DEPT Provider Note   CSN: [id number] Arrival date & time: 08/01/19  1934     History Chief Complaint  Patient presents with  . Shortness of Breath    Ashley Hodge is a 59 y.o. female.  HPI  HPI: A 59 year old patient with a history of hypertension and hypercholesterolemia presents for evaluation of chest pain. Initial onset of pain was approximately 3-6 hours ago. The patient's chest pain is sharp and is not worse with exertion. The patient's chest pain is middle- or left-sided, is not well-localized, is not described as heaviness/pressure/tightness and does not radiate to the arms/jaw/neck. The patient does not complain of nausea and denies diaphoresis. The patient has smoked in the past 90 days. The patient has no history of stroke, has no history of peripheral artery disease, denies any history of treated diabetes, has no relevant family history of coronary artery disease (first degree relative at less than age 82) and does not have an elevated BMI (>=30).  Patient states the pain is sharp.  She does not have any history of heart disease.  No history of PE.  She denies any leg swelling or leg cramping.  Patient does notice that certain movements and positions cause her to have the sharp pain in her chest wall.  Patient was moving boxes recently and thinks she may have pulled a muscle. Past Medical History:  Diagnosis Date  . Anxiety   . Asthma   . COPD (chronic obstructive pulmonary disease) ([place])   . High cholesterol   . Hypertension     There are no problems to display for this patient.   Past Surgical History:  Procedure Laterality Date  . TUBAL LIGATION       OB History    Gravida      Para      Term      Preterm      AB      Living  5     SAB      TAB      Ectopic      Multiple      Live Births              Family History  Problem Relation Age of Onset  . Heart disease Mother   . Hypertension Mother      Social History   Tobacco Use  . Smoking status: Current Every Day Smoker    Packs/day: 1.00    Types: Cigarettes  . Smokeless tobacco: Never Used  Substance Use Topics  . 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 Drug use: Never    Home Medications Prior to Admission medications   Medication Sig Start Date End Date Taking? Authorizing Provider  albuterol (PROVENTIL HFA;VENTOLIN HFA) 108 (90 Base) MCG/ACT inhaler Inhale 1-2 puffs into the lungs every 6 (six) hours as needed for shortness of breath or wheezing. 08/02/18   [provider]  amLODipine (NORVASC) 5 MG tablet Take 5 mg by mouth daily. 09/07/17   [provider]  cyclobenzaprine (FLEXERIL) 10 MG tablet Take 1 tablet (10 mg total) by mouth 2 (two) times daily as needed for muscle spasms. 08/01/19   Dorie Rank, MD  hydrochlorothiazide (HYDRODIURIL) 12.5 MG tablet Take 12.5 mg by mouth daily. 09/07/17   [provider]  ibuprofen (ADVIL) 800 MG tablet Take 1 tablet (800 mg total) by mouth 3 (three) times daily. 03/22/19   Palumbo, April, MD  losartan (COZAAR) 50 MG tablet Take 50 mg by mouth daily. 09/07/17   [provider]  naproxen (NAPROSYN) 375 MG tablet Take 1 tablet (375 mg total) by mouth 2 (two) times daily. 08/01/19   Linwood Dibbles, MD  omeprazole (PRILOSEC) 20 MG capsule Take 20 mg by mouth daily. 04/02/19   [provider]  predniSONE (DELTASONE) 50 MG tablet Take 1 tablet (50 mg total) by mouth daily. 04/17/19   Lawyer, Cristal Deer, PA-C  traMADol (ULTRAM) 50 MG tablet Take 1 tablet (50 mg total) by mouth every 6 (six) hours as needed for severe pain. 04/17/19   Lawyer, Cristal Deer, PA-C    Allergies    Lisinopril  Review of Systems   Review of Systems  All other systems reviewed and are negative.   Physical Exam Updated Vital Signs BP 107/72 (BP Location: Left Arm)   Pulse 85   Temp 98.3 F (36.8 C) (Oral)   Resp 20   Ht 1.524 m (5')   Wt 72.6 kg    SpO2 99%   BMI 31.25 kg/m   Physical Exam Vitals and nursing note reviewed.  Constitutional:      General: She is not in acute distress.    Appearance: She is well-developed.  HENT:     Head: Normocephalic and atraumatic.     Right Ear: External ear normal.     Left Ear: External ear normal.  Eyes:     General: No scleral icterus.       Right eye: No discharge.        Left eye: No discharge.     Conjunctiva/sclera: Conjunctivae normal.  Neck:     Trachea: No tracheal deviation.  Cardiovascular:     Rate and Rhythm: Normal rate and regular rhythm.  Pulmonary:     Effort: Pulmonary effort is normal. No respiratory distress.     Breath sounds: Normal breath sounds. No stridor. No wheezing or rales.     Comments: Tenderness palpation left chest wall Abdominal:     General: Bowel sounds are normal. There is no distension.     Palpations: Abdomen is soft.     Tenderness: There is no abdominal tenderness. There is no guarding or rebound.  Musculoskeletal:        General: No tenderness.     Cervical back: Neck supple.  Skin:    General: Skin is warm and dry.     Findings: No rash.  Neurological:     Mental Status: She is alert.     Cranial Nerves: No cranial nerve deficit (no facial droop, extraocular movements intact, no slurred speech).     Sensory: No sensory deficit.     Motor: No abnormal muscle tone or seizure activity.     Coordination: Coordination normal.     ED Results / Procedures / Treatments   Labs (all labs ordered are listed, but only abnormal results are displayed) Labs Reviewed  COMPREHENSIVE METABOLIC PANEL - Abnormal; Notable for the following components:      Result Value   Glucose, Bld 130 (*)    Creatinine, Ser 1.07 (*)    GFR calc non Af Amer 57 (*)    All other components within normal limits  CBC WITH DIFFERENTIAL/PLATELET - Abnormal; Notable for the following components:   WBC 12.7 (*)    Neutro Abs 8.2 (*)    All other components within  normal limits  TROPONIN I (HIGH SENSITIVITY)  TROPONIN I (HIGH SENSITIVITY)    EKG EKG  Interpretation  Date/Time:  Thursday August 01 2019 19:50:26 EDT Ventricular Rate:  94 PR Interval:    QRS Duration: 91 QT Interval:  351 QTC Calculation: 439 R Axis:   43 Text Interpretation: Sinus rhythm Low voltage, precordial leads No significant change since last tracing Confirmed by Linwood Dibbles [phone]) on 08/01/2019 10:29:56 PM   Radiology DG Chest 2 View  Result Date: 08/01/2019 CLINICAL DATA:  Shortness of breath. Left-sided pain. EXAM: CHEST - 2 VIEW COMPARISON:  Radiograph 08/03/2018 FINDINGS: The cardiomediastinal contours are normal. Mild bronchial thickening that appears similar to prior. Pulmonary vasculature is normal. No consolidation, pleural effusion, or pneumothorax. No acute osseous abnormalities are seen. IMPRESSION: Unchanged bronchial thickening. No acute abnormality. Electronically Signed   By: Narda Rutherford M.D.   On: 08/01/2019 20:24    Procedures Procedures (including critical care time)  Medications Ordered in ED Medications  naproxen (NAPROSYN) tablet 375 mg (has no administration in time range)  cyclobenzaprine (FLEXERIL) tablet 5 mg (has no administration in time range)    ED Course  I have reviewed the triage vital signs and the nursing notes.  Pertinent labs & imaging results that were available during my care of the patient were reviewed by me and considered in my medical decision making (see chart for details).    MDM Rules/Calculators/A&P HEAR Score: 3                    Patient presents with atypical chest pain.  ED work-up is reassuring.  She has low risk heart score.  Initial troponin is negative.  Patient's laboratory tests and x-rays are reassuring.  X-ray does not show pneumonia.  I have low suspicion for PE.  I doubt acute coronary syndrome.  I suspect symptoms are related to chest wall pain.  Plan on discharge home with anti-inflammatory  medications and muscle relaxant. Final Clinical Impression(s) / ED Diagnoses Final diagnoses:  Chest wall pain    Rx / DC Orders ED Discharge Orders         Ordered    naproxen (NAPROSYN) 375 MG tablet  2 times daily     08/01/19 2249    cyclobenzaprine (FLEXERIL) 10 MG tablet  2 times daily PRN     08/01/19 2249           Linwood Dibbles, MD 08/01/19 2252

## 2019-08-10 IMAGING — CR CHEST - 2 VIEW
2 series · 2 of 2 positions shown · non-contrast
Comparison: 02/28/2018

CLINICAL DATA: Chest pain and cough.

EXAM:
CHEST - 2 VIEW

[w chest pa]
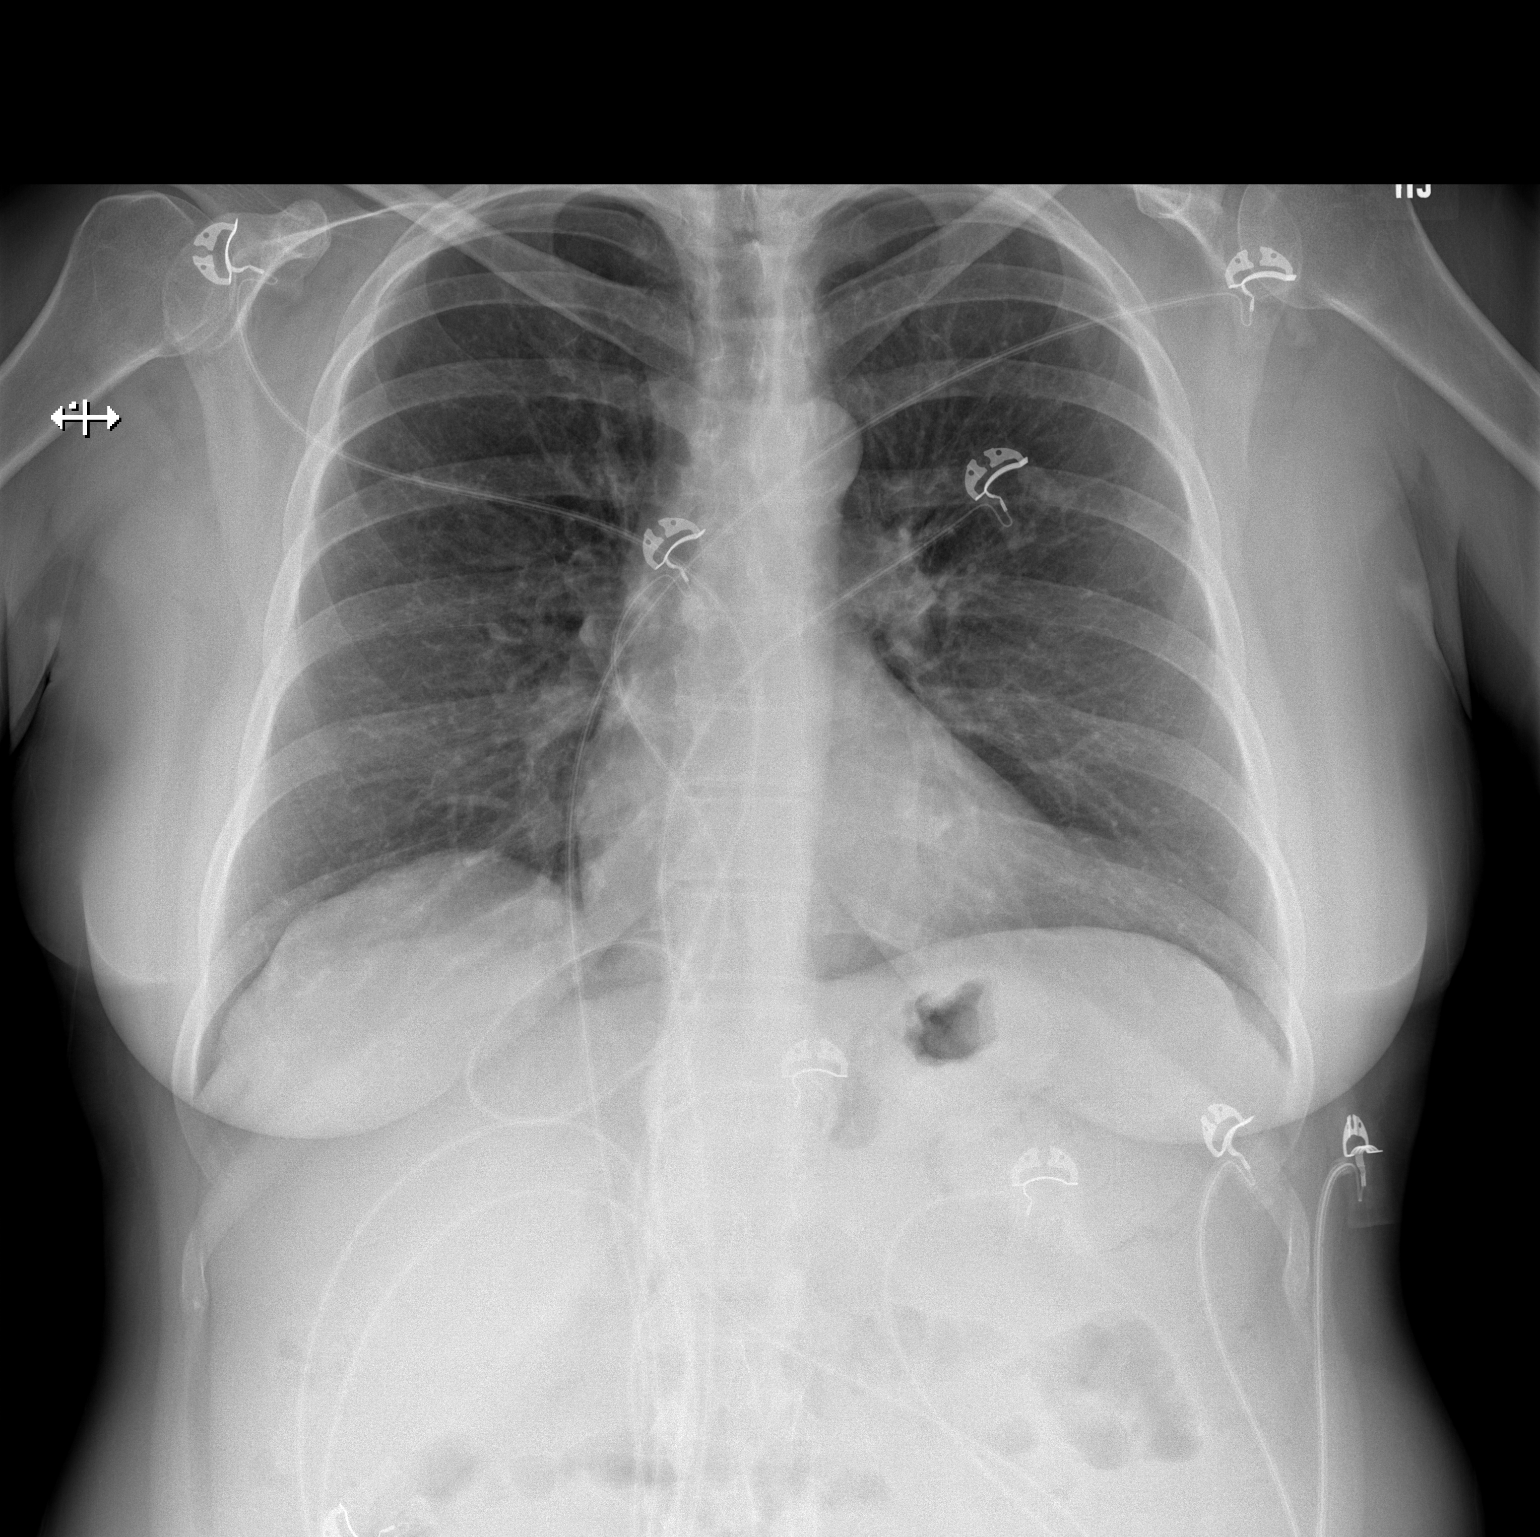

[w chest lat]
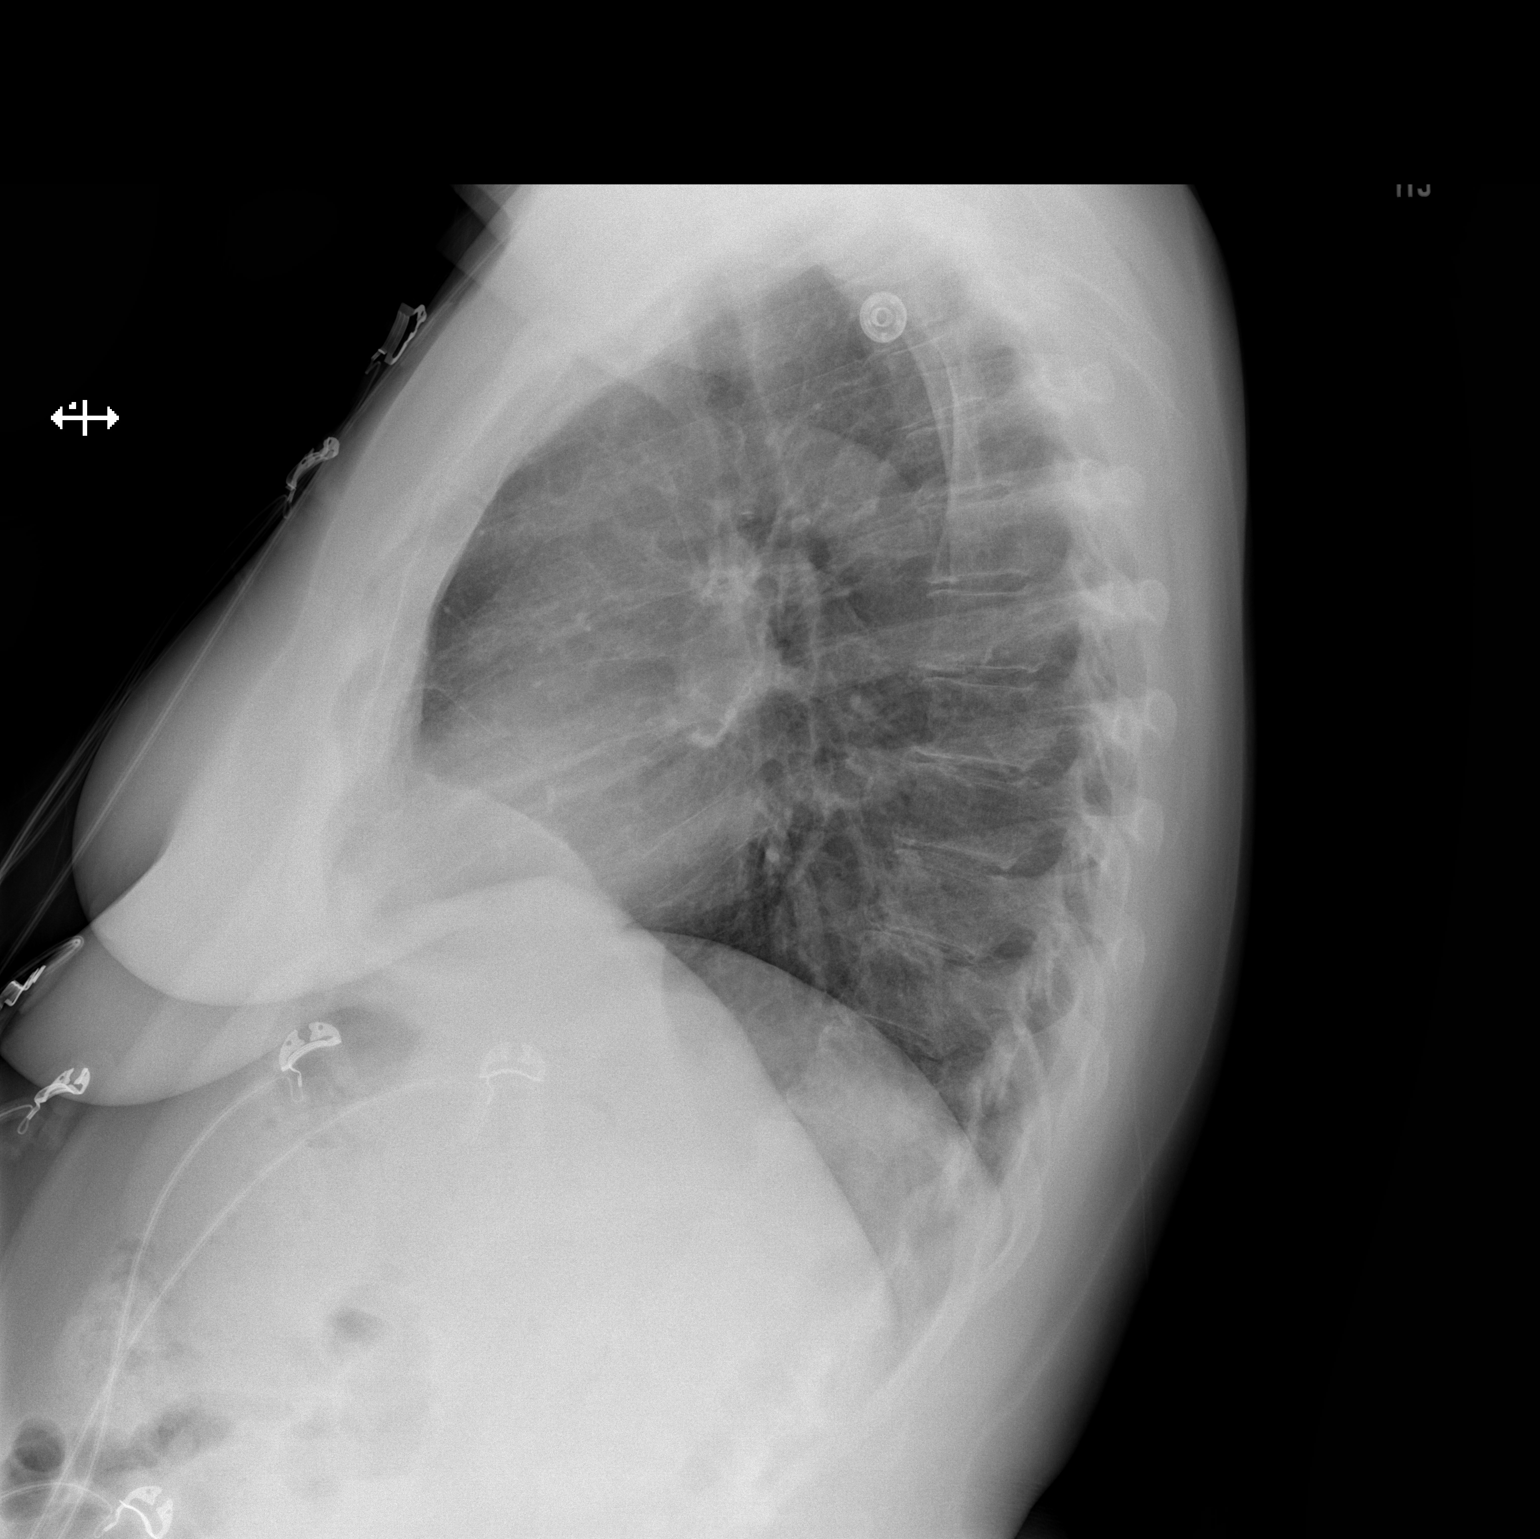

[2 of 2 positions shown; findings below may reference images not displayed]

FINDINGS: The heart size and mediastinal contours are within normal limits.
Both lungs are clear. Stable mild eventration of the right
hemidiaphragm. The visualized skeletal structures are unremarkable.
IMPRESSION: Normal chest x-ray.

## 2019-08-12 ENCOUNTER — Ambulatory Visit: Payer: Medicaid Other

## 2019-08-12 ENCOUNTER — Other Ambulatory Visit: Payer: Self-pay

## 2019-08-12 ENCOUNTER — Emergency Department (HOSPITAL_COMMUNITY): Payer: Medicare Other

## 2019-08-12 ENCOUNTER — Emergency Department (HOSPITAL_COMMUNITY)
Admission: EM | Admit: 2019-08-12 | Discharge: 2019-08-13 | Disposition: A | Discharge status: Home / Self Care | Payer: Medicare Other | Attending: Emergency Medicine | Admitting: Emergency Medicine

## 2019-08-12 DIAGNOSIS — Z79899 Other long term (current) drug therapy: Secondary | ICD-10-CM | POA: Insufficient documentation

## 2019-08-12 DIAGNOSIS — T07XXXA Unspecified multiple injuries, initial encounter: Secondary | ICD-10-CM

## 2019-08-12 DIAGNOSIS — S80212A Abrasion, left knee, initial encounter: Secondary | ICD-10-CM | POA: Insufficient documentation

## 2019-08-12 DIAGNOSIS — Y939 Activity, unspecified: Secondary | ICD-10-CM | POA: Insufficient documentation

## 2019-08-12 DIAGNOSIS — Y999 Unspecified external cause status: Secondary | ICD-10-CM | POA: Diagnosis not present

## 2019-08-12 DIAGNOSIS — S7011XA Contusion of right thigh, initial encounter: Secondary | ICD-10-CM | POA: Insufficient documentation

## 2019-08-12 DIAGNOSIS — S61411A Laceration without foreign body of right hand, initial encounter: Secondary | ICD-10-CM | POA: Insufficient documentation

## 2019-08-12 DIAGNOSIS — R519 Headache, unspecified: Secondary | ICD-10-CM | POA: Diagnosis not present

## 2019-08-12 DIAGNOSIS — M542 Cervicalgia: Secondary | ICD-10-CM | POA: Diagnosis not present

## 2019-08-12 DIAGNOSIS — Y929 Unspecified place or not applicable: Secondary | ICD-10-CM | POA: Insufficient documentation

## 2019-08-12 LAB — CBC WITH DIFFERENTIAL/PLATELET
Abs Immature Granulocytes: 0.05 10*3/uL (ref 0.00–0.07)
Basophils Absolute: 0.1 10*3/uL (ref 0.0–0.1)
Basophils Relative: 0 %
Eosinophils Absolute: 0.4 10*3/uL (ref 0.0–0.5)
Eosinophils Relative: 2 %
HCT: 44.8 % (ref 36.0–46.0)
Hemoglobin: 15 g/dL (ref 12.0–15.0)
Immature Granulocytes: 0 %
Lymphocytes Relative: 17 %
Lymphs Abs: 2.9 10*3/uL (ref 0.7–4.0)
MCH: 31.3 pg (ref 26.0–34.0)
MCHC: 33.5 g/dL (ref 30.0–36.0)
MCV: 93.5 fL (ref 80.0–100.0)
Monocytes Absolute: 1.1 10*3/uL — ABNORMAL HIGH (ref 0.1–1.0)
Monocytes Relative: 6 %
Neutro Abs: 12.3 10*3/uL — ABNORMAL HIGH (ref 1.7–7.7)
Neutrophils Relative %: 75 %
Platelets: 317 10*3/uL (ref 150–400)
RBC: 4.79 MIL/uL (ref 3.87–5.11)
RDW: 12 % (ref 11.5–15.5)
WBC: 16.7 10*3/uL — ABNORMAL HIGH (ref 4.0–10.5)
nRBC: 0 % (ref 0.0–0.2)

## 2019-08-12 LAB — COMPREHENSIVE METABOLIC PANEL
ALT: 18 U/L (ref 0–44)
AST: 32 U/L (ref 15–41)
Albumin: 3.9 g/dL (ref 3.5–5.0)
Alkaline Phosphatase: 95 U/L (ref 38–126)
Anion gap: 14 (ref 5–15)
BUN: 12 mg/dL (ref 6–20)
CO2: 20 mmol/L — ABNORMAL LOW (ref 22–32)
Calcium: 8.9 mg/dL (ref 8.9–10.3)
Chloride: 104 mmol/L (ref 98–111)
Creatinine, Ser: 0.97 mg/dL (ref 0.44–1.00)
GFR calc Af Amer: >60 mL/min (ref >60)
GFR calc non Af Amer: >60 mL/min (ref >60)
Glucose, Bld: 197 mg/dL — ABNORMAL HIGH (ref 70–99)
Potassium: 5.2 mmol/L — ABNORMAL HIGH (ref 3.5–5.1)
Sodium: 138 mmol/L (ref 135–145)
Total Bilirubin: 0.7 mg/dL (ref 0.3–1.2)
Total Protein: 7 g/dL (ref 6.5–8.1)

## 2019-08-12 MED ORDER — TETANUS-DIPHTH-ACELL PERTUSSIS 5-2.5-18.5 LF-MCG/0.5 IM SUSP
0.5000 mL | Freq: Once | INTRAMUSCULAR | Status: DC
  Filled 2019-08-12: qty 0.5

## 2019-08-12 MED ORDER — SODIUM CHLORIDE 0.9 % IV BOLUS
500.0000 mL | Freq: Once | INTRAVENOUS | Status: AC
  Administered 2019-08-12: 23:00:00 500 mL via INTRAVENOUS

## 2019-08-12 NOTE — Discharge Instructions (Addendum)
Please take Tylenol (acetaminophen) to relieve your pain.  You may take tylenol, up to 1,000 mg (two extra strength pills).  Do not take more than 3,000 mg tylenol in a 24 hour period.  Please check all medication labels as many medications such as pain and cold medications may contain tylenol. Please do not drink alcohol while taking this medication.  ° °
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
## 2019-08-13 NOTE — ED Notes (Signed)
Pt was able to ambulate without assistance.  

## 2019-08-13 NOTE — ED Notes (Signed)
Patient verbalizes understanding of discharge instructions. Opportunity for questioning and answers were provided. Armband removed by staff, pt discharged from ED. Pt. ambulatory and discharged home.  

## 2019-08-19 IMAGING — DX PORTABLE CHEST - 1 VIEW
1 series · 1 of 1 positions shown · non-contrast
Comparison: 07/25/2018

CLINICAL DATA: Pt c/o left sided chest pain and back pain that been
going on all day. Pt HX: COPD, HTN, current smoker

EXAM:
PORTABLE CHEST 1 VIEW

[chest ap]
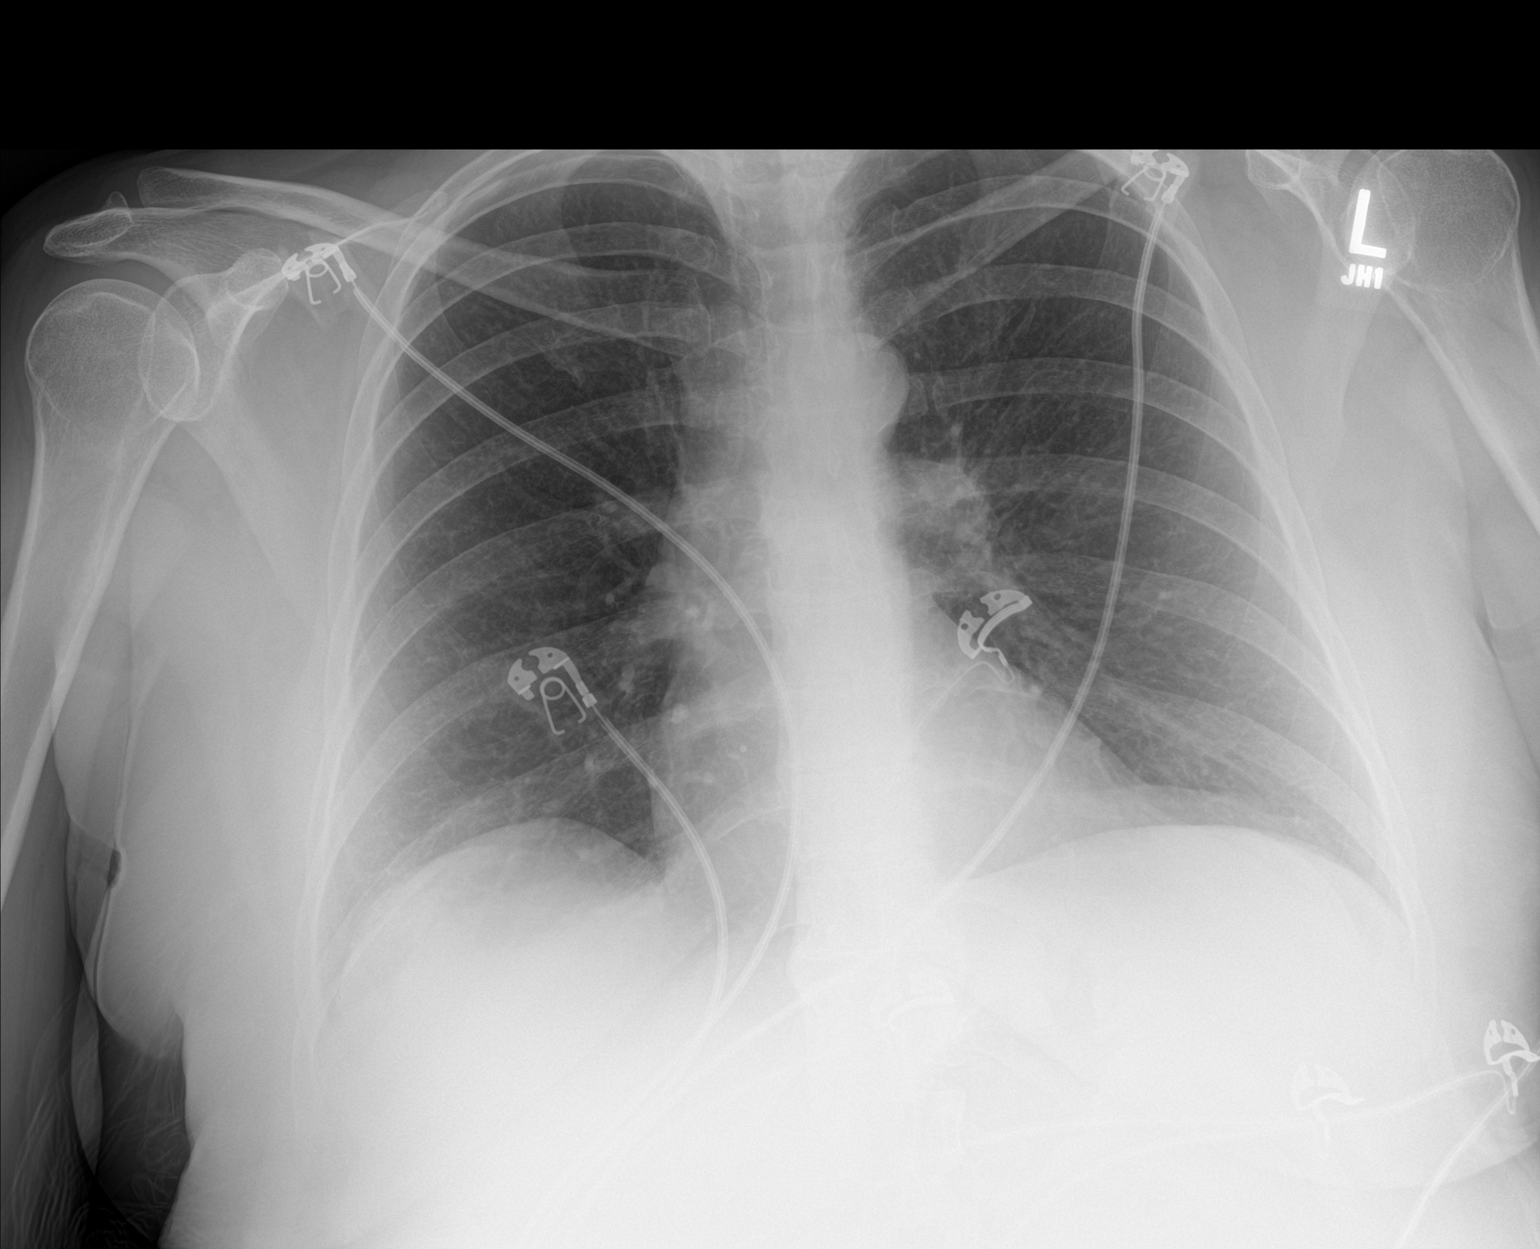

[1 of 1 positions shown; findings below may reference images not displayed]

FINDINGS: The heart size and mediastinal contours are within normal limits.
Both lungs are clear. The visualized skeletal structures are
unremarkable.
IMPRESSION: No active disease.

## 2019-08-26 ENCOUNTER — Emergency Department (HOSPITAL_COMMUNITY)
Admission: EM | Admit: 2019-08-26 | Discharge: 2019-08-26 | Disposition: A | Discharge status: Home / Self Care | Payer: Medicare Other | Attending: Emergency Medicine | Admitting: Emergency Medicine

## 2019-08-26 ENCOUNTER — Encounter (HOSPITAL_COMMUNITY): Payer: Self-pay | Admitting: Emergency Medicine

## 2019-08-26 ENCOUNTER — Other Ambulatory Visit: Payer: Self-pay

## 2019-08-26 DIAGNOSIS — K6289 Other specified diseases of anus and rectum: Secondary | ICD-10-CM | POA: Insufficient documentation

## 2019-08-26 DIAGNOSIS — J449 Chronic obstructive pulmonary disease, unspecified: Secondary | ICD-10-CM | POA: Insufficient documentation

## 2019-08-26 DIAGNOSIS — M25511 Pain in right shoulder: Secondary | ICD-10-CM | POA: Insufficient documentation

## 2019-08-26 DIAGNOSIS — M79651 Pain in right thigh: Secondary | ICD-10-CM | POA: Diagnosis not present

## 2019-08-26 DIAGNOSIS — S7011XD Contusion of right thigh, subsequent encounter: Secondary | ICD-10-CM | POA: Diagnosis not present

## 2019-08-26 DIAGNOSIS — F17210 Nicotine dependence, cigarettes, uncomplicated: Secondary | ICD-10-CM | POA: Diagnosis not present

## 2019-08-26 DIAGNOSIS — I10 Essential (primary) hypertension: Secondary | ICD-10-CM | POA: Insufficient documentation

## 2019-08-26 DIAGNOSIS — M25512 Pain in left shoulder: Secondary | ICD-10-CM | POA: Diagnosis not present

## 2019-08-26 DIAGNOSIS — Z79899 Other long term (current) drug therapy: Secondary | ICD-10-CM | POA: Insufficient documentation

## 2019-08-26 MED ORDER — METHOCARBAMOL 500 MG PO TABS: 500.0000 mg | ORAL_TABLET | Freq: Three times a day (TID) | ORAL | 0 refills | Status: DC | PRN
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
## 2019-08-26 NOTE — Discharge Instructions (Addendum)
You are being prescribed a medication which may make you sleepy. For 24 hours after one dose please do not drive, operate heavy machinery, care for a small child with out another adult present, or perform any activities that may cause harm to you or someone else if you were to fall asleep or be impaired.   

## 2019-09-10 ENCOUNTER — Other Ambulatory Visit: Payer: Self-pay

## 2019-09-10 ENCOUNTER — Encounter (HOSPITAL_COMMUNITY): Payer: Self-pay | Admitting: *Deleted

## 2019-09-10 DIAGNOSIS — J449 Chronic obstructive pulmonary disease, unspecified: Secondary | ICD-10-CM | POA: Insufficient documentation

## 2019-09-10 DIAGNOSIS — Z79899 Other long term (current) drug therapy: Secondary | ICD-10-CM | POA: Insufficient documentation

## 2019-09-10 DIAGNOSIS — S7011XD Contusion of right thigh, subsequent encounter: Secondary | ICD-10-CM | POA: Diagnosis not present

## 2019-09-10 DIAGNOSIS — F17210 Nicotine dependence, cigarettes, uncomplicated: Secondary | ICD-10-CM | POA: Insufficient documentation

## 2019-09-10 DIAGNOSIS — Z791 Long term (current) use of non-steroidal anti-inflammatories (NSAID): Secondary | ICD-10-CM | POA: Diagnosis not present

## 2019-09-10 DIAGNOSIS — I10 Essential (primary) hypertension: Secondary | ICD-10-CM | POA: Insufficient documentation
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
## 2019-09-11 ENCOUNTER — Emergency Department (HOSPITAL_COMMUNITY)
Admission: EM | Admit: 2019-09-11 | Discharge: 2019-09-11 | Disposition: A | Discharge status: Home / Self Care | Payer: Medicare Other | Attending: Emergency Medicine | Admitting: Emergency Medicine

## 2019-09-11 DIAGNOSIS — S7011XD Contusion of right thigh, subsequent encounter: Secondary | ICD-10-CM

## 2019-09-11 NOTE — Discharge Instructions (Addendum)
See your doctor for recheck of your blood pressure in 1-2 weeks.   Take ibuprofen for pain and soreness related to the hematoma in your right thigh.

## 2019-09-11 NOTE — ED Notes (Signed)
Patient ambulatory to the restroom.
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
## 2019-10-02 ENCOUNTER — Other Ambulatory Visit: Payer: Self-pay

## 2019-10-02 ENCOUNTER — Emergency Department (HOSPITAL_COMMUNITY)
Admission: EM | Admit: 2019-10-02 | Discharge: 2019-10-03 | Disposition: A | Discharge status: Home / Self Care | Payer: Medicare Other | Attending: Emergency Medicine | Admitting: Emergency Medicine

## 2019-10-02 ENCOUNTER — Encounter (HOSPITAL_COMMUNITY): Payer: Self-pay | Admitting: Emergency Medicine

## 2019-10-02 DIAGNOSIS — M546 Pain in thoracic spine: Secondary | ICD-10-CM | POA: Diagnosis not present

## 2019-10-02 DIAGNOSIS — Z5321 Procedure and treatment not carried out due to patient leaving prior to being seen by health care provider: Secondary | ICD-10-CM | POA: Insufficient documentation

## 2019-10-02 NOTE — ED Notes (Signed)
Pt complains of worsening R shoulder pain.

## 2019-10-02 NOTE — ED Notes (Signed)
ED tech notified RN that pt has increase pain. Repeating EKG.

## 2019-10-02 NOTE — ED Notes (Signed)
Pt complains

## 2019-10-02 NOTE — ED Triage Notes (Signed)
Patient arrives to ED with complaints of sudden mid thoracic back pain that radiates to her right arm. Patient states the pain started today while laying down and has not gotten better. Patient denies CP and SOB.

## 2019-10-03 NOTE — ED Notes (Signed)
Pt called for x4. Not seen in lobby. Expressed wanting to leave earlier.

## 2019-10-12 ENCOUNTER — Emergency Department (HOSPITAL_COMMUNITY): Payer: Medicare Other

## 2019-10-12 ENCOUNTER — Encounter (HOSPITAL_COMMUNITY): Payer: Self-pay

## 2019-10-12 ENCOUNTER — Emergency Department (HOSPITAL_COMMUNITY)
Admission: EM | Admit: 2019-10-12 | Discharge: 2019-10-12 | Disposition: A | Discharge status: Home / Self Care | Payer: Medicare Other | Attending: Emergency Medicine | Admitting: Emergency Medicine

## 2019-10-12 ENCOUNTER — Other Ambulatory Visit: Payer: Self-pay

## 2019-10-12 DIAGNOSIS — I10 Essential (primary) hypertension: Secondary | ICD-10-CM | POA: Diagnosis not present

## 2019-10-12 DIAGNOSIS — J449 Chronic obstructive pulmonary disease, unspecified: Secondary | ICD-10-CM | POA: Diagnosis not present

## 2019-10-12 DIAGNOSIS — M62830 Muscle spasm of back: Secondary | ICD-10-CM | POA: Insufficient documentation

## 2019-10-12 DIAGNOSIS — M546 Pain in thoracic spine: Secondary | ICD-10-CM | POA: Diagnosis present

## 2019-10-12 DIAGNOSIS — F17210 Nicotine dependence, cigarettes, uncomplicated: Secondary | ICD-10-CM | POA: Insufficient documentation

## 2019-10-12 DIAGNOSIS — R202 Paresthesia of skin: Secondary | ICD-10-CM | POA: Insufficient documentation

## 2019-10-12 DIAGNOSIS — Z79899 Other long term (current) drug therapy: Secondary | ICD-10-CM | POA: Insufficient documentation

## 2019-10-12 LAB — BASIC METABOLIC PANEL
Anion gap: 10 (ref 5–15)
BUN: 14 mg/dL (ref 6–20)
CO2: 26 mmol/L (ref 22–32)
Calcium: 9.8 mg/dL (ref 8.9–10.3)
Chloride: 106 mmol/L (ref 98–111)
Creatinine, Ser: 0.81 mg/dL (ref 0.44–1.00)
GFR calc Af Amer: >60 mL/min (ref >60)
GFR calc non Af Amer: >60 mL/min (ref >60)
Glucose, Bld: 149 mg/dL — ABNORMAL HIGH (ref 70–99)
Potassium: 4.9 mmol/L (ref 3.5–5.1)
Sodium: 142 mmol/L (ref 135–145)

## 2019-10-12 LAB — CBC
HCT: 45.2 % (ref 36.0–46.0)
Hemoglobin: 14.9 g/dL (ref 12.0–15.0)
MCH: 30.7 pg (ref 26.0–34.0)
MCHC: 33 g/dL (ref 30.0–36.0)
MCV: 93 fL (ref 80.0–100.0)
Platelets: 344 10*3/uL (ref 150–400)
RBC: 4.86 MIL/uL (ref 3.87–5.11)
RDW: 11.7 % (ref 11.5–15.5)
WBC: 13.2 10*3/uL — ABNORMAL HIGH (ref 4.0–10.5)
nRBC: 0 % (ref 0.0–0.2)

## 2019-10-12 LAB — TROPONIN I (HIGH SENSITIVITY)
Troponin I (High Sensitivity): 4 ng/L (ref <18)
Troponin I (High Sensitivity): 4 ng/L (ref <18)

## 2019-10-12 MED ORDER — ACETAMINOPHEN 325 MG PO TABS
650.0000 mg | ORAL_TABLET | Freq: Once | ORAL | Status: AC
  Administered 2019-10-12: 650 mg via ORAL
  Filled 2019-10-12: qty 2

## 2019-10-12 MED ORDER — METHOCARBAMOL 750 MG PO TABS
750.0000 mg | ORAL_TABLET | Freq: Every evening | ORAL | 0 refills | Status: DC | PRN
Start: 2019-10-12 — End: 2019-10-12

## 2019-10-12 MED ORDER — METHOCARBAMOL 500 MG PO TABS
500.0000 mg | ORAL_TABLET | Freq: Once | ORAL | Status: AC
  Administered 2019-10-12: 500 mg via ORAL
  Filled 2019-10-12: qty 1

## 2019-10-12 MED ORDER — METHOCARBAMOL 750 MG PO TABS: 750.0000 mg | ORAL_TABLET | Freq: Every evening | ORAL | 0 refills | Status: AC | PRN

## 2019-10-12 MED ORDER — SODIUM CHLORIDE 0.9% FLUSH: 3.0000 mL | Freq: Once | INTRAVENOUS | Status: DC

## 2019-10-12 NOTE — Discharge Instructions (Signed)
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
## 2019-10-12 NOTE — ED Triage Notes (Signed)
Patient complains of left arm tingling and back pain/ spasms since early am. States that the pain is thoracic/ cervical. Denies CP. Alert and oriented, NAD

## 2019-10-12 NOTE — ED Notes (Signed)
Patient verbalizes understanding of discharge instructions. Opportunity for questioning and answers were provided. Armband removed by staff, pt discharged from ED via wheelchair.  

## 2019-10-31 ENCOUNTER — Encounter (HOSPITAL_COMMUNITY): Payer: Self-pay | Admitting: Emergency Medicine

## 2019-10-31 ENCOUNTER — Emergency Department (HOSPITAL_COMMUNITY)
Admission: EM | Admit: 2019-10-31 | Discharge: 2019-10-31 | Discharge status: Against Medical Advice | Payer: Medicare Other | Attending: Emergency Medicine | Admitting: Emergency Medicine

## 2019-10-31 DIAGNOSIS — J329 Chronic sinusitis, unspecified: Secondary | ICD-10-CM | POA: Insufficient documentation

## 2019-10-31 DIAGNOSIS — R0981 Nasal congestion: Secondary | ICD-10-CM | POA: Diagnosis present

## 2019-10-31 NOTE — ED Triage Notes (Signed)
Per pt, states she thinks she has a sinus infection-nasal congestion

## 2019-11-04 ENCOUNTER — Other Ambulatory Visit: Payer: Self-pay

## 2019-11-04 ENCOUNTER — Emergency Department (HOSPITAL_COMMUNITY)
Admission: EM | Admit: 2019-11-04 | Discharge: 2019-11-04 | Disposition: A | Discharge status: Home / Self Care | Payer: Medicare Other | Attending: Emergency Medicine | Admitting: Emergency Medicine

## 2019-11-04 ENCOUNTER — Other Ambulatory Visit: Payer: Self-pay | Admitting: Family Medicine

## 2019-11-04 ENCOUNTER — Encounter (HOSPITAL_COMMUNITY): Payer: Self-pay | Admitting: Emergency Medicine

## 2019-11-04 ENCOUNTER — Ambulatory Visit (INDEPENDENT_AMBULATORY_CARE_PROVIDER_SITE_OTHER)
Admission: EM | Admit: 2019-11-04 | Discharge: 2019-11-04 | Disposition: A | Discharge status: Home / Self Care | Payer: Medicare Other | Source: Home / Self Care | Attending: Family Medicine | Admitting: Family Medicine

## 2019-11-04 ENCOUNTER — Encounter (HOSPITAL_COMMUNITY): Payer: Self-pay

## 2019-11-04 DIAGNOSIS — Z5321 Procedure and treatment not carried out due to patient leaving prior to being seen by health care provider: Secondary | ICD-10-CM | POA: Diagnosis not present

## 2019-11-04 DIAGNOSIS — M79651 Pain in right thigh: Secondary | ICD-10-CM

## 2019-11-04 DIAGNOSIS — M898X1 Other specified disorders of bone, shoulder: Secondary | ICD-10-CM | POA: Diagnosis not present

## 2019-11-04 DIAGNOSIS — H9202 Otalgia, left ear: Secondary | ICD-10-CM | POA: Diagnosis not present

## 2019-11-04 DIAGNOSIS — H6982 Other specified disorders of Eustachian tube, left ear: Secondary | ICD-10-CM

## 2019-11-04 MED ORDER — FLUTICASONE PROPIONATE 50 MCG/ACT NA SUSP
2.0000 | Freq: Every day | NASAL | 0 refills | Status: DC
Start: 2019-11-04 — End: 2020-01-31
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
## 2019-11-04 NOTE — ED Triage Notes (Signed)
Pt reports intermittent throbbing to L ear x 1 week.  States she also has nasal congestion at night.
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
## 2019-11-04 NOTE — Discharge Instructions (Signed)
Please try the flonase  Please try the exercises for the shoulder  Please try ice for the shoulder  Please try heat and compression on the leg.  Please follow up if your symptoms fail to improve.

## 2019-11-16 ENCOUNTER — Encounter (HOSPITAL_COMMUNITY): Payer: Self-pay | Admitting: Emergency Medicine

## 2019-11-16 ENCOUNTER — Emergency Department (HOSPITAL_COMMUNITY)
Admission: EM | Admit: 2019-11-16 | Discharge: 2019-11-17 | Disposition: A | Discharge status: Home / Self Care | Payer: Medicare Other | Attending: Emergency Medicine | Admitting: Emergency Medicine

## 2019-11-16 ENCOUNTER — Other Ambulatory Visit: Payer: Self-pay

## 2019-11-16 DIAGNOSIS — M546 Pain in thoracic spine: Secondary | ICD-10-CM | POA: Insufficient documentation

## 2019-11-16 DIAGNOSIS — F17210 Nicotine dependence, cigarettes, uncomplicated: Secondary | ICD-10-CM | POA: Diagnosis not present

## 2019-11-16 DIAGNOSIS — Z79899 Other long term (current) drug therapy: Secondary | ICD-10-CM | POA: Insufficient documentation

## 2019-11-16 DIAGNOSIS — J449 Chronic obstructive pulmonary disease, unspecified: Secondary | ICD-10-CM | POA: Diagnosis not present

## 2019-11-16 DIAGNOSIS — I10 Essential (primary) hypertension: Secondary | ICD-10-CM | POA: Insufficient documentation

## 2019-11-16 DIAGNOSIS — K5900 Constipation, unspecified: Secondary | ICD-10-CM | POA: Diagnosis not present

## 2019-11-16 DIAGNOSIS — R0789 Other chest pain: Secondary | ICD-10-CM | POA: Diagnosis not present

## 2019-11-16 DIAGNOSIS — K5909 Other constipation: Secondary | ICD-10-CM

## 2019-11-16 NOTE — ED Triage Notes (Signed)
Pt c/o upper back pain that radiates to her arms and neck, denies any injury.

## 2019-11-17 DIAGNOSIS — M546 Pain in thoracic spine: Secondary | ICD-10-CM | POA: Diagnosis not present

## 2019-11-17 LAB — TROPONIN I (HIGH SENSITIVITY): Troponin I (High Sensitivity): 3 ng/L (ref <18)

## 2019-11-17 MED ORDER — METHOCARBAMOL 500 MG PO TABS
500.0000 mg | ORAL_TABLET | Freq: Once | ORAL | Status: AC
  Administered 2019-11-17: 500 mg via ORAL
  Filled 2019-11-17: qty 1

## 2019-11-17 MED ORDER — METHOCARBAMOL 500 MG PO TABS
500.0000 mg | ORAL_TABLET | Freq: Three times a day (TID) | ORAL | 0 refills | Status: DC | PRN
Start: 2019-11-17 — End: 2019-12-03

## 2019-11-17 MED ORDER — ONDANSETRON 4 MG PO TBDP: 4.0000 mg | ORAL_TABLET | Freq: Four times a day (QID) | ORAL | 0 refills | Status: DC | PRN

## 2019-11-17 MED ORDER — IBUPROFEN 800 MG PO TABS
800.0000 mg | ORAL_TABLET | Freq: Once | ORAL | Status: AC
  Administered 2019-11-17: 800 mg via ORAL
  Filled 2019-11-17: qty 1

## 2019-11-17 MED ORDER — IBUPROFEN 800 MG PO TABS
800.0000 mg | ORAL_TABLET | Freq: Three times a day (TID) | ORAL | 0 refills | Status: DC | PRN
Start: 2019-11-17 — End: 2019-12-03

## 2019-11-17 NOTE — Discharge Instructions (Signed)
I recommend that you increase your water and fiber intake. If you are not able to eat foods high in fiber, you may use Benefiber or Metamucil over-the-counter. I also recommend you use MiraLAX 1-2 times a day and Colace 100 mg twice a day to help with bowel movements. These medications are over the counter.  You may use other over-the-counter medications such as Dulcolax, Fleet enemas, magnesium citrate as needed for constipation. Please note that some of these medications may cause you to have abdominal cramping which is normal. If you develop severe abdominal pain, fever (temperature of 100.4 or higher), persistent vomiting, distention of your abdomen, unable to have a bowel movement for 5 days or are not passing gas, please return to the hospital. ° °

## 2019-11-17 NOTE — ED Notes (Signed)
Pt c/o cp while waiting to the waiting area, prior EKG done and we will draw a cardiac enzyme to r/o any cardiac problem.

## 2019-11-17 NOTE — ED Provider Notes (Signed)
TIME SEEN: 4:06 AM  CHIEF COMPLAINT: Multiple complaints  HPI: Patient is a 59 year old female with history of hypertension, hyperlipidemia, COPD who presents to the emergency department with multiple complaints.  Reports she has had several days of upper back pain.  She has had this before and was told it was a muscle spasm.  Pain worse with movement and palpation.  No numbness, tingling, focal weakness, bowel or bladder incontinence.  Also feels like she may have pulled a muscle in her chest.  Is also tender to palpation or worse with movement.  No shortness of breath, fever, cough, lower extremity swelling or pain.  She also reports intermittent constipation.  She has been given magnesium citrate before which does cause her to have a bowel movement but causes significant cramping.  No abdominal pain.  No vomiting.  She is also requesting a prescription for Zofran as she states it is nice to have prescription for nausea medicine if needed.  No current nausea.  No recent vomiting.  ROS: See HPI Constitutional: no fever  Eyes: no drainage  ENT: no runny nose   Cardiovascular:   chest pain  Resp: no SOB  GI: no vomiting GU: no dysuria Integumentary: no rash  Allergy: no hives  Musculoskeletal: no leg swelling  Neurological: no slurred speech ROS otherwise negative  PAST MEDICAL HISTORY/PAST SURGICAL HISTORY:  Past Medical History:  Diagnosis Date  . Anxiety   . Asthma   . COPD (chronic obstructive pulmonary disease) (HCC)   . High cholesterol   . Hypertension     MEDICATIONS:  Prior to Admission medications   Medication Sig Start Date End Date Taking? Authorizing Provider  albuterol (PROVENTIL HFA;VENTOLIN HFA) 108 (90 Base) MCG/ACT inhaler Inhale 1-2 puffs into the lungs every 6 (six) hours as needed for shortness of breath or wheezing. 08/02/18   [provider]  albuterol (VENTOLIN HFA) 108 (90 Base) MCG/ACT inhaler Inhale 2 puffs into the lungs every 6 (six) hours  as needed for wheezing.  06/26/19   [provider]  amLODipine (NORVASC) 5 MG tablet Take 5 mg by mouth daily. 09/07/17   [provider]  amLODipine (NORVASC) 5 MG tablet Take 5 mg by mouth daily. 05/21/19   [provider]  BREO ELLIPTA 100-25 MCG/INH AEPB Inhale 1 puff into the lungs daily as needed (sob/wheezing).  06/26/19   [provider]  cyclobenzaprine (FLEXERIL) 10 MG tablet Take 1 tablet (10 mg total) by mouth 2 (two) times daily as needed for muscle spasms. 08/01/19   Linwood Dibbles, MD  fluticasone (FLONASE) 50 MCG/ACT nasal spray Place 2 sprays into both nostrils daily. 11/04/19 11/03/20  Myra Rude, MD  hydrochlorothiazide (HYDRODIURIL) 12.5 MG tablet Take 12.5 mg by mouth daily. 09/07/17   [provider]  ibuprofen (ADVIL) 800 MG tablet Take 1 tablet (800 mg total) by mouth 3 (three) times daily. 03/22/19   Palumbo, April, MD  losartan (COZAAR) 50 MG tablet Take 50 mg by mouth daily. 09/07/17   [provider]  losartan (COZAAR) 50 MG tablet Take 50 mg by mouth daily. 05/21/19   [provider]  naproxen (NAPROSYN) 375 MG tablet Take 1 tablet (375 mg total) by mouth 2 (two) times daily. 08/01/19   Linwood Dibbles, MD  omeprazole (PRILOSEC) 20 MG capsule Take 20 mg by mouth daily. 04/02/19   [provider]  pravastatin (PRAVACHOL) 80 MG tablet Take 80 mg by mouth daily. 03/16/18 07/04/19  [provider]  pravastatin (  PRAVACHOL) 80 MG tablet Take 80 mg by mouth daily. 05/21/19   [provider]  predniSONE (DELTASONE) 50 MG tablet Take 1 tablet (50 mg total) by mouth daily. 04/17/19   Lawyer, Cristal Deer, PA-C  promethazine-dextromethorphan (PROMETHAZINE-DM) 6.25-15 MG/5ML syrup Take 5 mLs by mouth every 6 (six) hours as needed. 07/03/19   [provider]  traMADol (ULTRAM) 50 MG tablet Take 1 tablet (50 mg total) by mouth every 6 (six) hours as needed for severe pain. 04/17/19   Lawyer, Cristal Deer, PA-C     ALLERGIES:  Allergies  Allergen Reactions  . Lisinopril Other (See Comments) and Cough    cough  . Lisinopril     SOCIAL HISTORY:  Social History   Tobacco Use  . Smoking status: Current Every Day Smoker    Packs/day: 1.00    Types: Cigarettes  . Smokeless tobacco: Never Used  Substance Use Topics  . Alcohol use: Not Currently    Comment: "clean x 4 yrs"    FAMILY HISTORY: Family History  Problem Relation Age of Onset  . Heart disease Mother   . Hypertension Mother     EXAM: BP 129/69 (BP Location: Left Arm)   Pulse 91   Temp 98.1 F (36.7 C) (Oral)   Resp 17   SpO2 94%  CONSTITUTIONAL: Alert and oriented and responds appropriately to questions. Well-appearing; well-nourished HEAD: Normocephalic EYES: Conjunctivae clear, pupils appear equal, EOM appear intact ENT: normal nose; moist mucous membranes NECK: Supple, normal ROM CARD: RRR; S1 and S2 appreciated; no murmurs, no clicks, no rubs, no gallops CHEST:  Chest wall is tender to palpation over the anterior chest wall.  No crepitus, ecchymosis, erythema, warmth, rash or other lesions present.   RESP: Normal chest excursion without splinting or tachypnea; breath sounds clear and equal bilaterally; no wheezes, no rhonchi, no rales, no hypoxia or respiratory distress, speaking full sentences ABD/GI: Normal bowel sounds; non-distended; soft, non-tender, no rebound, no guarding, no peritoneal signs, no hepatosplenomegaly BACK:  The back appears normal but is tender to palpation diffusely over the thoracic paraspinal muscles bilaterally without redness, warmth, soft tissue swelling, ecchymosis.  No midline spinal tenderness or step-off or deformity. EXT: Normal ROM in all joints; no deformity noted, no edema; no cyanosis, no calf tenderness or calf swelling SKIN: Normal color for age and race; warm; no rash on exposed skin NEURO: Moves all extremities equally, normal sensation diffusely, normal speech PSYCH: The  patient's mood and manner are appropriate.   MEDICAL DECISION MAKING: Patient here with multiple complaints.  Her chest pain and back pain seem to be musculoskeletal in nature.  She has had an EKG in triage which shows no ischemia and her troponin is negative.  I do not feel she needs a repeat troponin given my suspicion for ACS is extremely low.  She is hemodynamically stable here.  Lungs are clear with equal breath sounds.  No infectious symptoms.  Doubt PE, ACS, dissection, pneumothorax, pneumonia, CHF.  Will discharge with ibuprofen and Robaxin.  She has no midline spinal tenderness on exam or neurologic deficits.  Doubt cauda equina, epidural abscess or hematoma, discitis osteomyelitis, transverse myelitis, fracture.    As for constipation, will discharge with instructions for high-fiber diet and instructions for bowel regiment.  Her abdominal exam today is benign and she is not vomiting.  Doubt bowel obstruction, colitis, diverticulitis, appendicitis or other acute surgical abnormality.  Patient also requesting refill of Zofran.  States that she is not having symptoms but would  just like to have this medication if needed.  Provided with Zofran prescription today.  Patient has PCP follow-up.  Have urged her to follow-up with her primary care physician in the future for nonemergent complaints.   At this time, I do not feel there is any life-threatening condition present. I have reviewed, interpreted and discussed all results (EKG, imaging, lab, urine as appropriate) and exam findings with patient/family. I have reviewed nursing notes and appropriate previous records.  I feel the patient is safe to be discharged home without further emergent workup and can continue workup as an outpatient as needed. Discussed usual and customary return precautions. Patient/family verbalize understanding and are comfortable with this plan.  Outpatient follow-up has been provided as needed. All questions have been  answered.    EKG Interpretation  Date/Time:  Saturday November 16 2019 21:56:56 EDT Ventricular Rate:  97 PR Interval:  144 QRS Duration: 76 QT Interval:  336 QTC Calculation: 426 R Axis:   9 Text Interpretation: Normal sinus rhythm Low voltage QRS Cannot rule out Anterior infarct , age undetermined Abnormal ECG No significant change since last tracing Confirmed by Rochele Raring [phone]) on 11/17/2019 3:44:03 AM         Ashley Hodge was evaluated in Emergency Department on 11/17/2019 for the symptoms described in the history of present illness. She was evaluated in the context of the global COVID-19 pandemic, which necessitated consideration that the patient might be at risk for infection with the SARS-CoV-2 virus that causes COVID-19. Institutional protocols and algorithms that pertain to the evaluation of patients at risk for COVID-19 are in a state of rapid change based on information released by regulatory bodies including the CDC and federal and state organizations. These policies and algorithms were followed during the patient's care in the ED.      Dejha King, Layla Maw, DO 11/17/19 [phone]

## 2019-11-18 ENCOUNTER — Other Ambulatory Visit: Payer: Self-pay

## 2019-11-18 ENCOUNTER — Encounter (HOSPITAL_COMMUNITY): Payer: Self-pay

## 2019-11-18 DIAGNOSIS — Z79899 Other long term (current) drug therapy: Secondary | ICD-10-CM | POA: Diagnosis not present

## 2019-11-18 DIAGNOSIS — R05 Cough: Secondary | ICD-10-CM | POA: Insufficient documentation

## 2019-11-18 DIAGNOSIS — F17210 Nicotine dependence, cigarettes, uncomplicated: Secondary | ICD-10-CM | POA: Insufficient documentation

## 2019-11-18 DIAGNOSIS — J45909 Unspecified asthma, uncomplicated: Secondary | ICD-10-CM | POA: Insufficient documentation

## 2019-11-18 DIAGNOSIS — R0789 Other chest pain: Secondary | ICD-10-CM | POA: Diagnosis not present

## 2019-11-18 DIAGNOSIS — I10 Essential (primary) hypertension: Secondary | ICD-10-CM | POA: Insufficient documentation

## 2019-11-18 DIAGNOSIS — Z7951 Long term (current) use of inhaled steroids: Secondary | ICD-10-CM | POA: Insufficient documentation

## 2019-11-18 DIAGNOSIS — J449 Chronic obstructive pulmonary disease, unspecified: Secondary | ICD-10-CM | POA: Insufficient documentation

## 2019-11-18 NOTE — ED Triage Notes (Signed)
Pt reports left side pain. Pt reports being seen at [facility] for right sided shoulder and neck pain and was given a muscle relaxer. Pt reports the muscle relaxer has not helped the pain on her left side.

## 2019-11-19 ENCOUNTER — Emergency Department (HOSPITAL_COMMUNITY): Payer: Medicare Other

## 2019-11-19 ENCOUNTER — Emergency Department (HOSPITAL_COMMUNITY)
Admission: EM | Admit: 2019-11-19 | Discharge: 2019-11-19 | Disposition: A | Discharge status: Home / Self Care | Payer: Medicare Other | Attending: Emergency Medicine | Admitting: Emergency Medicine

## 2019-11-19 DIAGNOSIS — R0789 Other chest pain: Secondary | ICD-10-CM | POA: Diagnosis not present

## 2019-11-19 MED ORDER — IBUPROFEN 200 MG PO TABS
600.0000 mg | ORAL_TABLET | Freq: Once | ORAL | Status: AC
  Administered 2019-11-19: 600 mg via ORAL
  Filled 2019-11-19: qty 3

## 2019-11-19 NOTE — Discharge Instructions (Addendum)
You can apply warm compresses for comfort. Take ibuprofen 600 mg (3 tablets of over-the-counter strength Motrin, Advil or ibuprofen).   Follow up with your doctor if pain continues.

## 2019-11-19 NOTE — ED Provider Notes (Signed)
[facility]-EMERGENCY DEPT Provider Note   CSN: [id number] Arrival date & time: 11/18/19  2217     History Chief Complaint  Patient presents with   Side Pain    Ashley Hodge is a 59 y.o. female.  Patient to ED with complaint of pain in her left lateral chest wall x 1 day. She states she has asthma and is a continuous smoker. She has a cough that is worse than her baseline cough and she feels her left pain is caused by this cough. There is no pain with breathing, no SOB. No fever. Cough is nonproductive. No vomiting or abdominal pain. She reports being seen at [facility] recently for right sided pain diagnosed as muscular spasm with a muscle relaxer that is improving.   The history is provided by the patient. No language interpreter was used.       Past Medical History:  Diagnosis Date   Anxiety    Asthma    COPD (chronic obstructive pulmonary disease) (HCC)    High cholesterol    Hypertension     There are no problems to display for this patient.   Past Surgical History:  Procedure Laterality Date   TUBAL LIGATION       OB History    Gravida      Para      Term      Preterm      AB      Living  5     SAB      TAB      Ectopic      Multiple      Live Births              Family History  Problem Relation Age of Onset   Heart disease Mother    Hypertension Mother     Social History   Tobacco Use   Smoking status: Current Every Day Smoker    Packs/day: 1.00    Types: Cigarettes   Smokeless tobacco: Never Used  Vaping Use   Vaping Use: Never used  Substance Use Topics   Alcohol use: Not Currently    Comment: "clean x 4 yrs"   Drug use: Never    Home Medications Prior to Admission medications   Medication Sig Start Date End Date Taking? Authorizing Provider  albuterol (PROVENTIL HFA;VENTOLIN HFA) 108 (90 Base) MCG/ACT inhaler Inhale 1-2 puffs into the lungs every 6 (six) hours as needed for  shortness of breath or wheezing. 08/02/18   [provider]  albuterol (VENTOLIN HFA) 108 (90 Base) MCG/ACT inhaler Inhale 2 puffs into the lungs every 6 (six) hours as needed for wheezing.  06/26/19   [provider]  amLODipine (NORVASC) 5 MG tablet Take 5 mg by mouth daily. 09/07/17   [provider]  amLODipine (NORVASC) 5 MG tablet Take 5 mg by mouth daily. 05/21/19   [provider]  BREO ELLIPTA 100-25 MCG/INH AEPB Inhale 1 puff into the lungs daily as needed (sob/wheezing).  06/26/19   [provider]  cyclobenzaprine (FLEXERIL) 10 MG tablet Take 1 tablet (10 mg total) by mouth 2 (two) times daily as needed for muscle spasms. 08/01/19   Linwood Dibbles, MD  fluticasone (FLONASE) 50 MCG/ACT nasal spray Place 2 sprays into both nostrils daily. 11/04/19 11/03/20  Myra Rude, MD  hydrochlorothiazide (HYDRODIURIL) 12.5 MG tablet Take 12.5 mg by mouth daily. 09/07/17   [provider]  ibuprofen (ADVIL) 800 MG tablet Take  1 tablet (800 mg total) by mouth every 8 (eight) hours as needed for mild pain. 11/17/19   Ward, Layla Maw, DO  losartan (COZAAR) 50 MG tablet Take 50 mg by mouth daily. 09/07/17   [provider]  losartan (COZAAR) 50 MG tablet Take 50 mg by mouth daily. 05/21/19   [provider]  methocarbamol (ROBAXIN) 500 MG tablet Take 1 tablet (500 mg total) by mouth every 8 (eight) hours as needed for muscle spasms. 11/17/19   Ward, Layla Maw, DO  naproxen (NAPROSYN) 375 MG tablet Take 1 tablet (375 mg total) by mouth 2 (two) times daily. 08/01/19   Linwood Dibbles, MD  omeprazole (PRILOSEC) 20 MG capsule Take 20 mg by mouth daily. 04/02/19   [provider]  ondansetron (ZOFRAN ODT) 4 MG disintegrating tablet Take 1 tablet (4 mg total) by mouth every 6 (six) hours as needed. 11/17/19   Ward, Layla Maw, DO  pravastatin (PRAVACHOL) 80 MG tablet Take 80 mg by mouth daily. 03/16/18 07/04/19  [provider]  pravastatin  (PRAVACHOL) 80 MG tablet Take 80 mg by mouth daily. 05/21/19   [provider]  predniSONE (DELTASONE) 50 MG tablet Take 1 tablet (50 mg total) by mouth daily. 04/17/19   Lawyer, Cristal Deer, PA-C  promethazine-dextromethorphan (PROMETHAZINE-DM) 6.25-15 MG/5ML syrup Take 5 mLs by mouth every 6 (six) hours as needed. 07/03/19   [provider]  traMADol (ULTRAM) 50 MG tablet Take 1 tablet (50 mg total) by mouth every 6 (six) hours as needed for severe pain. 04/17/19   Lawyer, Cristal Deer, PA-C    Allergies    Lisinopril and Lisinopril  Review of Systems   Review of Systems  Constitutional: Negative for chills and fever.  HENT: Negative.   Respiratory: Positive for cough. Negative for shortness of breath.   Cardiovascular: Positive for chest pain.  Gastrointestinal: Negative.  Negative for abdominal pain and vomiting.  Musculoskeletal: Negative for neck pain.       See HPI.  Skin: Negative.   Neurological: Negative.  Negative for weakness and light-headedness.    Physical Exam Updated Vital Signs BP (!) 150/81    Pulse 88    Temp 98.5 F (36.9 C)    Resp 17    SpO2 99%   Physical Exam Vitals and nursing note reviewed.  Cardiovascular:     Rate and Rhythm: Normal rate and regular rhythm.     Heart sounds: No murmur heard.   Pulmonary:     Effort: Pulmonary effort is normal.     Breath sounds: No wheezing, rhonchi or rales.  Chest:     Chest wall: Tenderness (Left chest wall tenderness at mid-axillary line.) present.  Abdominal:     Palpations: Abdomen is soft.     ED Results / Procedures / Treatments   Labs (all labs ordered are listed, but only abnormal results are displayed) Labs Reviewed - No data to display  EKG None  Radiology No results found.  Procedures Procedures (including critical care time)  Medications Ordered in ED Medications  ibuprofen (ADVIL) tablet 600 mg (has no administration in time range)    ED Course  I have reviewed the  triage vital signs and the nursing notes.  Pertinent labs & imaging results that were available during my care of the patient were reviewed by me and considered in my medical decision making (see chart for details).    MDM Rules/Calculators/A&P  Patient to ED with left lateral chest pain as per HPI.  Ibuprofen provided for likely muscular pain. CXR done and is clear of infection, PTX. Pain better with ibuprofen and she is resting comfortably on recheck.   She can be discharged home and is encouraged to see her doctor for recheck if symptoms persist.   Final Clinical Impression(s) / ED Diagnoses Final diagnoses:  None   1. Chest wall pain   Rx / DC Orders ED Discharge Orders    None       Elpidio Anis, PA-C 11/19/19 0241    Ward, Layla Maw, DO 11/19/19 0301

## 2019-12-03 ENCOUNTER — Emergency Department (HOSPITAL_COMMUNITY)
Admission: EM | Admit: 2019-12-03 | Discharge: 2019-12-03 | Disposition: A | Discharge status: Home / Self Care | Payer: Medicare Other | Attending: Emergency Medicine | Admitting: Emergency Medicine

## 2019-12-03 ENCOUNTER — Encounter (HOSPITAL_COMMUNITY): Payer: Self-pay

## 2019-12-03 ENCOUNTER — Emergency Department (HOSPITAL_COMMUNITY): Payer: Medicare Other

## 2019-12-03 ENCOUNTER — Other Ambulatory Visit: Payer: Self-pay

## 2019-12-03 DIAGNOSIS — M549 Dorsalgia, unspecified: Secondary | ICD-10-CM | POA: Insufficient documentation

## 2019-12-03 DIAGNOSIS — M25512 Pain in left shoulder: Secondary | ICD-10-CM | POA: Diagnosis not present

## 2019-12-03 DIAGNOSIS — Z7951 Long term (current) use of inhaled steroids: Secondary | ICD-10-CM | POA: Diagnosis not present

## 2019-12-03 DIAGNOSIS — Z79899 Other long term (current) drug therapy: Secondary | ICD-10-CM | POA: Diagnosis not present

## 2019-12-03 DIAGNOSIS — I10 Essential (primary) hypertension: Secondary | ICD-10-CM | POA: Diagnosis not present

## 2019-12-03 DIAGNOSIS — J449 Chronic obstructive pulmonary disease, unspecified: Secondary | ICD-10-CM | POA: Diagnosis not present

## 2019-12-03 DIAGNOSIS — F17210 Nicotine dependence, cigarettes, uncomplicated: Secondary | ICD-10-CM | POA: Diagnosis not present

## 2019-12-03 DIAGNOSIS — M25511 Pain in right shoulder: Secondary | ICD-10-CM | POA: Diagnosis not present

## 2019-12-03 DIAGNOSIS — R509 Fever, unspecified: Secondary | ICD-10-CM | POA: Diagnosis not present

## 2019-12-03 LAB — TROPONIN I (HIGH SENSITIVITY)
Troponin I (High Sensitivity): <2 ng/L (ref <18)
Troponin I (High Sensitivity): <2 ng/L (ref <18)

## 2019-12-03 LAB — CBC WITH DIFFERENTIAL/PLATELET
Abs Immature Granulocytes: 0.01 10*3/uL (ref 0.00–0.07)
Basophils Absolute: 0.1 10*3/uL (ref 0.0–0.1)
Basophils Relative: 1 %
Eosinophils Absolute: 0.2 10*3/uL (ref 0.0–0.5)
Eosinophils Relative: 2 %
HCT: 40.9 % (ref 36.0–46.0)
Hemoglobin: 13.7 g/dL (ref 12.0–15.0)
Immature Granulocytes: 0 %
Lymphocytes Relative: 25 %
Lymphs Abs: 2.1 10*3/uL (ref 0.7–4.0)
MCH: 31.4 pg (ref 26.0–34.0)
MCHC: 33.5 g/dL (ref 30.0–36.0)
MCV: 93.6 fL (ref 80.0–100.0)
Monocytes Absolute: 1.3 10*3/uL — ABNORMAL HIGH (ref 0.1–1.0)
Monocytes Relative: 15 %
Neutro Abs: 4.7 10*3/uL (ref 1.7–7.7)
Neutrophils Relative %: 57 %
Platelets: 240 10*3/uL (ref 150–400)
RBC: 4.37 MIL/uL (ref 3.87–5.11)
RDW: 11.7 % (ref 11.5–15.5)
WBC: 8.3 10*3/uL (ref 4.0–10.5)
nRBC: 0 % (ref 0.0–0.2)

## 2019-12-03 LAB — BASIC METABOLIC PANEL
Anion gap: 11 (ref 5–15)
BUN: 16 mg/dL (ref 6–20)
CO2: 25 mmol/L (ref 22–32)
Calcium: 8.7 mg/dL — ABNORMAL LOW (ref 8.9–10.3)
Chloride: 105 mmol/L (ref 98–111)
Creatinine, Ser: 0.83 mg/dL (ref 0.44–1.00)
GFR calc Af Amer: >60 mL/min (ref >60)
GFR calc non Af Amer: >60 mL/min (ref >60)
Glucose, Bld: 129 mg/dL — ABNORMAL HIGH (ref 70–99)
Potassium: 3.4 mmol/L — ABNORMAL LOW (ref 3.5–5.1)
Sodium: 141 mmol/L (ref 135–145)

## 2019-12-03 LAB — D-DIMER, QUANTITATIVE: D-Dimer, Quant: 0.62 ug/mL-FEU — ABNORMAL HIGH (ref 0.00–0.50)

## 2019-12-03 MED ORDER — IOHEXOL 350 MG/ML SOLN
100.0000 mL | Freq: Once | INTRAVENOUS | Status: AC | PRN
  Administered 2019-12-03: 100 mL via INTRAVENOUS

## 2019-12-03 MED ORDER — IBUPROFEN 800 MG PO TABS
800.0000 mg | ORAL_TABLET | Freq: Once | ORAL | Status: AC
  Administered 2019-12-03: 800 mg via ORAL
  Filled 2019-12-03: qty 1

## 2019-12-03 MED ORDER — METHOCARBAMOL 500 MG PO TABS
500.0000 mg | ORAL_TABLET | Freq: Three times a day (TID) | ORAL | 0 refills | Status: DC | PRN
Start: 2019-12-03 — End: 2019-12-11

## 2019-12-03 MED ORDER — SODIUM CHLORIDE (PF) 0.9 % IJ SOLN
INTRAMUSCULAR | Status: AC
  Filled 2019-12-03: qty 50

## 2019-12-03 MED ORDER — IBUPROFEN 800 MG PO TABS
800.0000 mg | ORAL_TABLET | Freq: Three times a day (TID) | ORAL | 0 refills | Status: DC | PRN
Start: 2019-12-03 — End: 2019-12-11

## 2019-12-03 MED ORDER — METHOCARBAMOL 500 MG PO TABS
500.0000 mg | ORAL_TABLET | Freq: Once | ORAL | Status: AC
  Administered 2019-12-03: 500 mg via ORAL
  Filled 2019-12-03: qty 1

## 2019-12-03 MED ORDER — IOHEXOL 300 MG/ML  SOLN: 100.0000 mL | Freq: Once | INTRAMUSCULAR | Status: DC | PRN

## 2019-12-03 NOTE — ED Triage Notes (Signed)
Pt sts bilateral shoulder pain for 2 weeks. Pt seen at [facility] and dx with muscle spasms. Pt sts muscle relaxers aren't working.

## 2019-12-03 NOTE — ED Provider Notes (Signed)
[facility]-EMERGENCY DEPT Provider Note   CSN: [id number] Arrival date & time: 12/03/19  0018     History Chief Complaint  Patient presents with  . Shoulder Pain    Ashley Hodge is a 59 y.o. female.  Patient with history of anxiety, COPD and hypertension here with several week history of bilateral upper back and shoulder pain.  She denies any specific injury.  States she was seen for this at [facility] several weeks ago diagnosed with a muscle strain.  She was given Robaxin and ibuprofen which she states are not helping.  States the pain was mostly on the right side previously but is now across her entire upper back and involving the left upper back as well.  The pain comes and goes lasting for several hours at a time.  She does not notice any provoking or palliating factors.  She denies any difficulty breathing, cough, fever, chest pain or abdominal pain.  No nausea or vomiting.  States she had a fever of 100.5 earlier today but did not take anything for it. Denies any recent cardiac history.  There is no pain with range of motion of her shoulders.  No focal weakness, numbness or tingling.  No bowel or bladder incontinence.  No history of immunosuppression or IV drug use.  The history is provided by the patient.  Shoulder Pain Associated symptoms: back pain and fever        Past Medical History:  Diagnosis Date  . Anxiety   . Asthma   . COPD (chronic obstructive pulmonary disease) (HCC)   . High cholesterol   . Hypertension     There are no problems to display for this patient.   Past Surgical History:  Procedure Laterality Date  . TUBAL LIGATION       OB History    Gravida      Para      Term      Preterm      AB      Living  5     SAB      TAB      Ectopic      Multiple      Live Births              Family History  Problem Relation Age of Onset  . Heart disease Mother   . Hypertension Mother     Social History    Tobacco Use  . Smoking status: Current Every Day Smoker    Packs/day: 1.00    Types: Cigarettes  . Smokeless tobacco: Never Used  Vaping Use  . Vaping Use: Never used  Substance Use Topics  . 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 Drug use: Never    Home Medications Prior to Admission medications   Medication Sig Start Date End Date Taking? Authorizing Provider  albuterol (PROVENTIL HFA;VENTOLIN HFA) 108 (90 Base) MCG/ACT inhaler Inhale 1-2 puffs into the lungs every 6 (six) hours as needed for shortness of breath or wheezing. 08/02/18   [provider]  albuterol (VENTOLIN HFA) 108 (90 Base) MCG/ACT inhaler Inhale 2 puffs into the lungs every 6 (six) hours as needed for wheezing.  06/26/19   [provider]  amLODipine (NORVASC) 5 MG tablet Take 5 mg by mouth daily. 09/07/17   [provider]  amLODipine (NORVASC) 5 MG tablet Take 5 mg by mouth daily. 05/21/19   [provider]  Virgel BouquetBREO  ELLIPTA 100-25 MCG/INH AEPB Inhale 1 puff into the lungs daily as needed (sob/wheezing).  06/26/19   [provider]  cyclobenzaprine (FLEXERIL) 10 MG tablet Take 1 tablet (10 mg total) by mouth 2 (two) times daily as needed for muscle spasms. 08/01/19   Linwood Dibbles, MD  fluticasone (FLONASE) 50 MCG/ACT nasal spray Place 2 sprays into both nostrils daily. 11/04/19 11/03/20  Myra Rude, MD  hydrochlorothiazide (HYDRODIURIL) 12.5 MG tablet Take 12.5 mg by mouth daily. 09/07/17   [provider]  ibuprofen (ADVIL) 800 MG tablet Take 1 tablet (800 mg total) by mouth every 8 (eight) hours as needed for mild pain. 11/17/19   Ward, Layla Maw, DO  losartan (COZAAR) 50 MG tablet Take 50 mg by mouth daily. 09/07/17   [provider]  losartan (COZAAR) 50 MG tablet Take 50 mg by mouth daily. 05/21/19   [provider]  methocarbamol (ROBAXIN) 500 MG tablet Take 1 tablet (500 mg total) by mouth every 8 (eight) hours as needed for muscle  spasms. 11/17/19   Ward, Layla Maw, DO  naproxen (NAPROSYN) 375 MG tablet Take 1 tablet (375 mg total) by mouth 2 (two) times daily. 08/01/19   Linwood Dibbles, MD  omeprazole (PRILOSEC) 20 MG capsule Take 20 mg by mouth daily. 04/02/19   [provider]  ondansetron (ZOFRAN ODT) 4 MG disintegrating tablet Take 1 tablet (4 mg total) by mouth every 6 (six) hours as needed. 11/17/19   Ward, Layla Maw, DO  pravastatin (PRAVACHOL) 80 MG tablet Take 80 mg by mouth daily. 03/16/18 07/04/19  [provider]  pravastatin (PRAVACHOL) 80 MG tablet Take 80 mg by mouth daily. 05/21/19   [provider]  predniSONE (DELTASONE) 50 MG tablet Take 1 tablet (50 mg total) by mouth daily. 04/17/19   Lawyer, Cristal Deer, PA-C  promethazine-dextromethorphan (PROMETHAZINE-DM) 6.25-15 MG/5ML syrup Take 5 mLs by mouth every 6 (six) hours as needed. 07/03/19   [provider]  traMADol (ULTRAM) 50 MG tablet Take 1 tablet (50 mg total) by mouth every 6 (six) hours as needed for severe pain. 04/17/19   Lawyer, Cristal Deer, PA-C    Allergies    Lisinopril and Lisinopril  Review of Systems   Review of Systems  Constitutional: Positive for fever. Negative for activity change and appetite change.  HENT: Negative for congestion and rhinorrhea.   Respiratory: Negative for cough and shortness of breath.   Cardiovascular: Negative for chest pain.  Gastrointestinal: Negative for abdominal pain, nausea and vomiting.  Genitourinary: Negative for dysuria.  Musculoskeletal: Positive for arthralgias, back pain and myalgias.  Skin: Negative for rash.  Neurological: Negative for dizziness, weakness and headaches.   all other systems are negative except as noted in the HPI and PMH.   Physical Exam Updated Vital Signs BP 124/68 (BP Location: Left Arm)   Pulse 99   Temp 98.4 F (36.9 C) (Oral)   Resp 18   Ht 5' (1.524 m)   Wt 72 kg   SpO2 95%   BMI 31.00 kg/m   Physical Exam Vitals and nursing note  reviewed.  Constitutional:      General: She is not in acute distress.    Appearance: She is well-developed.  HENT:     Head: Normocephalic and atraumatic.     Mouth/Throat:     Pharynx: No oropharyngeal exudate.  Eyes:     Conjunctiva/sclera: Conjunctivae normal.     Pupils: Pupils are equal, round, and reactive to light.  Neck:  Comments: No meningismus Paraspinal cervical tenderness bilaterally with rhomboid tenderness.  No meningismus. Cardiovascular:     Rate and Rhythm: Normal rate and regular rhythm.     Heart sounds: Normal heart sounds. No murmur heard.   Pulmonary:     Effort: Pulmonary effort is normal. No respiratory distress.     Breath sounds: Normal breath sounds.  Abdominal:     Palpations: Abdomen is soft.     Tenderness: There is no abdominal tenderness. There is no guarding or rebound.  Musculoskeletal:        General: Tenderness present. Normal range of motion.     Cervical back: Normal range of motion and neck supple. Tenderness present.     Comments: Bilateral paraspinal cervical, rhomboid and trapezius tenderness with spasm.  Full range of motion of shoulders bilaterally.  Able to raise arms above head. Equal radial pulses and grip strengths.  Skin:    General: Skin is warm.  Neurological:     Mental Status: She is alert and oriented to person, place, and time.     Cranial Nerves: No cranial nerve deficit.     Motor: No abnormal muscle tone.     Coordination: Coordination normal.     Comments: No ataxia on finger to nose bilaterally. No pronator drift. 5/5 strength throughout. CN 2-12 intact.Equal grip strength. Sensation intact.   Psychiatric:        Behavior: Behavior normal.     ED Results / Procedures / Treatments   Labs (all labs ordered are listed, but only abnormal results are displayed) Labs Reviewed  CBC WITH DIFFERENTIAL/PLATELET - Abnormal; Notable for the following components:      Result Value   Monocytes Absolute 1.3 (*)     All other components within normal limits  BASIC METABOLIC PANEL - Abnormal; Notable for the following components:   Potassium 3.4 (*)    Glucose, Bld 129 (*)    Calcium 8.7 (*)    All other components within normal limits  D-DIMER, QUANTITATIVE (NOT AT [facility]) - Abnormal; Notable for the following components:   D-Dimer, Quant 0.62 (*)    All other components within normal limits  TROPONIN I (HIGH SENSITIVITY)  TROPONIN I (HIGH SENSITIVITY)    EKG EKG Interpretation  Date/Time:  Tuesday December 03 2019 01:42:17 EDT Ventricular Rate:  87 PR Interval:    QRS Duration: 89 QT Interval:  369 QTC Calculation: 444 R Axis:   25 Text Interpretation: Sinus rhythm Low voltage, precordial leads Borderline T abnormalities, anterior leads 12 Lead; Mason-Likar No significant change was found Confirmed by Glynn Octave [id number]) on 12/03/2019 1:46:56 AM   Radiology DG Chest 2 View  Result Date: 12/03/2019 CLINICAL DATA:  Bilateral shoulder pain for 2 weeks. EXAM: CHEST - 2 VIEW COMPARISON:  November 19, 2019 FINDINGS: There is no evidence of acute infiltrate, pleural effusion or pneumothorax. A very faint, ill-defined 0.9 cm area of increased opacification is seen overlying the lateral aspect of the upper left lung. This represents a new finding when compared to the prior study. The heart size and mediastinal contours are within normal limits. The visualized skeletal structures are unremarkable. IMPRESSION: Very faint, ill-defined area of increased opacification overlying the lateral aspect of the upper left lung. A small area of atelectasis cannot be excluded. Follow-up to resolution is recommended. Electronically Signed   By: Aram Candela M.D.   On: 12/03/2019 02:14   CT Angio Chest PE W and/or Wo Contrast  Result Date: 12/03/2019 CLINICAL DATA:  Chest and shoulder pain. Positive D-dimer. EXAM: CT ANGIOGRAPHY CHEST WITH CONTRAST TECHNIQUE: Multidetector CT imaging of the chest was performed using the  standard protocol during bolus administration of intravenous contrast. Multiplanar CT image reconstructions and MIPs were obtained to evaluate the vascular anatomy. CONTRAST:  OMNIPAQUE IOHEXOL 350 MG/ML SOLN COMPARISON:  None. FINDINGS: Cardiovascular: The heart is normal in size. No pericardial effusion. The aorta is normal in caliber. No dissection. Scattered atherosclerotic calcifications. The branch vessels are patent. No obvious coronary artery calcifications. The pulmonary arterial tree is fairly well opacified. No definite filling defects to suggest pulmonary embolism. Mediastinum/Nodes: Small scattered mediastinal and hilar lymph nodes. No mass or overt adenopathy. The esophagus is grossly normal. Lungs/Pleura: Changes of centrilobar emphysema. No infiltrates or effusions. No worrisome pulmonary lesions. No interstitial lung disease or bronchiectasis. Mild bronchial wall thickening and minimal surrounding inflammation in the left upper lobe suggesting focal bronchitis. Upper Abdomen: No significant upper abdominal findings. Musculoskeletal: No breast masses, supraclavicular or axillary adenopathy. The thyroid gland appears normal. The bony structures are unremarkable. Review of the MIP images confirms the above findings. IMPRESSION: 1. No CT findings for pulmonary embolism. 2. Normal caliber thoracic aorta and no dissection. 3. Changes of centrilobar emphysema. 4. Mild left upper lobe bronchitis.  No infiltrates. 5. Emphysema and aortic atherosclerosis. Aortic Atherosclerosis (ICD10-I70.0) and Emphysema (ICD10-J43.9). Aortic Atherosclerosis (ICD10-I70.0) and Emphysema (ICD10-J43.9). Electronically Signed   By: Rudie Meyer M.D.   On: 12/03/2019 05:32    Procedures Procedures (including critical care time)  Medications Ordered in ED Medications  ibuprofen (ADVIL) tablet 800 mg (800 mg Oral Given 12/03/19 0131)  methocarbamol (ROBAXIN) tablet 500 mg (500 mg Oral Given 12/03/19 0131)    ED  Course  I have reviewed the triage vital signs and the nursing notes.  Pertinent labs & imaging results that were available during my care of the patient were reviewed by me and considered in my medical decision making (see chart for details).    MDM Rules/Calculators/A&P                          Several weeks of ongoing upper back pain rating across both shoulder blades.  Pain is reproducible and worse with palpation and movement.  Low suspicion for ACS, PE, aortic dissection.  EKG is sinus rhythm and unchanged.  Chest x-ray shows no pneumothorax or rib fracture.  No obvious infiltrate.  Troponin negative.  Ongoing symptoms for several weeks.  Low suspicion for ACS.  Patient feels improved with anti-inflammatories and Robaxin in the ED. Troponin negative x2. D-dimer elevated but CT shows no evidence of pulmonary embolism or other acute pathology. No evidence of aortic dissection. Questionable infiltrate seen on chest x-ray is not confirmed on CT scan.  Patient to be treated supportively for musculoskeletal upper back and shoulder pain. She be given anti-inflammatories and muscle relaxers. Use hot packs. Follow-up with PCP. Return precautions discussed.   Final Clinical Impression(s) / ED Diagnoses Final diagnoses:  Upper back pain    Rx / DC Orders ED Discharge Orders    None       Linda Grimmer, Jeannett Senior, MD 12/03/19 [phone]

## 2019-12-03 NOTE — Discharge Instructions (Signed)
There is no evidence of heart attack or blood clot in the lung. No evidence of pneumonia. Take the anti-inflammatories and muscle relaxers as prescribed. Follow-up with your doctor. Return to the ED if your chest pain becomes exertional, associated with shortness of breath, nausea, vomiting, sweating or any other concerns.

## 2019-12-07 ENCOUNTER — Other Ambulatory Visit: Payer: Self-pay

## 2019-12-07 ENCOUNTER — Emergency Department (HOSPITAL_COMMUNITY)
Admission: EM | Admit: 2019-12-07 | Discharge: 2019-12-07 | Disposition: A | Discharge status: Home / Self Care | Payer: Medicare Other | Attending: Emergency Medicine | Admitting: Emergency Medicine

## 2019-12-07 ENCOUNTER — Emergency Department (HOSPITAL_COMMUNITY): Payer: Medicare Other

## 2019-12-07 DIAGNOSIS — I10 Essential (primary) hypertension: Secondary | ICD-10-CM | POA: Insufficient documentation

## 2019-12-07 DIAGNOSIS — Z79899 Other long term (current) drug therapy: Secondary | ICD-10-CM | POA: Insufficient documentation

## 2019-12-07 DIAGNOSIS — J449 Chronic obstructive pulmonary disease, unspecified: Secondary | ICD-10-CM | POA: Diagnosis not present

## 2019-12-07 DIAGNOSIS — B349 Viral infection, unspecified: Secondary | ICD-10-CM | POA: Insufficient documentation

## 2019-12-07 DIAGNOSIS — F17210 Nicotine dependence, cigarettes, uncomplicated: Secondary | ICD-10-CM | POA: Diagnosis not present

## 2019-12-07 DIAGNOSIS — Z7951 Long term (current) use of inhaled steroids: Secondary | ICD-10-CM | POA: Insufficient documentation

## 2019-12-07 DIAGNOSIS — R000 Tachycardia, unspecified: Secondary | ICD-10-CM | POA: Diagnosis not present

## 2019-12-07 DIAGNOSIS — R05 Cough: Secondary | ICD-10-CM | POA: Diagnosis present

## 2019-12-07 LAB — CBC
HCT: 42.5 % (ref 36.0–46.0)
Hemoglobin: 13.7 g/dL (ref 12.0–15.0)
MCH: 30.2 pg (ref 26.0–34.0)
MCHC: 32.2 g/dL (ref 30.0–36.0)
MCV: 93.8 fL (ref 80.0–100.0)
Platelets: 205 10*3/uL (ref 150–400)
RBC: 4.53 MIL/uL (ref 3.87–5.11)
RDW: 11.8 % (ref 11.5–15.5)
WBC: 10.1 10*3/uL (ref 4.0–10.5)
nRBC: 0 % (ref 0.0–0.2)

## 2019-12-07 LAB — BASIC METABOLIC PANEL
Anion gap: 11 (ref 5–15)
BUN: 7 mg/dL (ref 6–20)
CO2: 27 mmol/L (ref 22–32)
Calcium: 8.5 mg/dL — ABNORMAL LOW (ref 8.9–10.3)
Chloride: 99 mmol/L (ref 98–111)
Creatinine, Ser: 0.83 mg/dL (ref 0.44–1.00)
GFR calc Af Amer: >60 mL/min (ref >60)
GFR calc non Af Amer: >60 mL/min (ref >60)
Glucose, Bld: 114 mg/dL — ABNORMAL HIGH (ref 70–99)
Potassium: 3.5 mmol/L (ref 3.5–5.1)
Sodium: 137 mmol/L (ref 135–145)

## 2019-12-07 MED ORDER — AEROCHAMBER PLUS FLO-VU LARGE MISC
Status: AC
  Administered 2019-12-07: 1
  Filled 2019-12-07: qty 1

## 2019-12-07 MED ORDER — PROMETHAZINE HCL 25 MG PO TABS: 25.0000 mg | ORAL_TABLET | Freq: Once | ORAL | Status: DC

## 2019-12-07 MED ORDER — ONDANSETRON HCL 4 MG PO TABS: 4.0000 mg | ORAL_TABLET | Freq: Every day | ORAL | 1 refills | Status: DC | PRN

## 2019-12-07 MED ORDER — AEROCHAMBER PLUS FLO-VU LARGE MISC: 1.0000 | Freq: Once | Status: AC

## 2019-12-07 MED ORDER — IBUPROFEN 400 MG PO TABS
600.0000 mg | ORAL_TABLET | Freq: Once | ORAL | Status: AC
  Administered 2019-12-07: 600 mg via ORAL
  Filled 2019-12-07: qty 1

## 2019-12-07 MED ORDER — ALBUTEROL SULFATE HFA 108 (90 BASE) MCG/ACT IN AERS
4.0000 | INHALATION_SPRAY | Freq: Once | RESPIRATORY_TRACT | Status: AC
  Administered 2019-12-07: 4 via RESPIRATORY_TRACT
  Filled 2019-12-07: qty 6.7

## 2019-12-07 NOTE — ED Notes (Signed)
Iv d/c with catheter intact. Pt states she is going home. Pt also requesting to speek with Child psychotherapist.

## 2019-12-07 NOTE — ED Provider Notes (Signed)
[facility]ERGENCY [facility]PART[facility] Provider Note   CSN: [id number] Arrival date & time: 12/07/19  [id number]     History Chief Complaint  Patient presents with  . Shortness of Breath    Karis JubaKathy Waterson is a 59 y.o. woman with history of asthma, HTN, hyperlipidemia and over 20 pack-year smoking history who presents with a four-day history of subjective fever/chills, cough, and congestion.  The patient reports she was in her usual state of health until 4 days ago this past Tuesday (7/20) when she woke up with a dry cough and overall "felt lousy." Over the last 4 days, she reports developing fever/chills, nasal congestion, and fatigue. She reports her cough also became productive of green sputum yesterday, and she developed bilateral lower rib pain with coughing. She decided to come to the emergency department today because the cough became unbearable. She also endorses persistent nausea without vomiting over the last couple of days with associated decreased PO intake. She denies shortness of breath, chest pain or pressure, abdominal pain, urinary complaints, numbness/tingling, or weakness. She reports she received her two COVID vaccinations earlier this spring. She lives in a motel with her two adult sons, one of whom is not vaccinated against COVID. Denies sick contacts or known COVID exposure. She reports that she ran out of her prescription for albuterol "a long time ago" but had not used it in a while even before that.      Past Medical History:  Diagnosis Date  . Anxiety   . Asthma   . COPD (chronic obstructive pulmonary disease) (HCC)   . High cholesterol   . Hypertension     There are no problems to display for this patient.   Past Surgical History:  Procedure Laterality Date  . TUBAL LIGATION       OB History    Gravida      Para      Term      Preterm      AB      Living  5     SAB      TAB      Ectopic      Multiple      Live Births               Family History  Problem Relation Age of Onset  . Heart disease Mother   . Hypertension Mother     Social History   Tobacco Use  . Smoking status: Current Every Day Smoker    Packs/day: 1.00    Types: Cigarettes  . Smokeless tobacco: Never Used  Vaping Use  . Vaping Use: Never used  Substance Use Topics  . 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 Drug use: Never    Home Medications Prior to Admission medications   Medication Sig Start Date End Date Taking? Authorizing Provider  albuterol (PROVENTIL HFA;VENTOLIN HFA) 108 (90 Base) MCG/ACT inhaler Inhale 1-2 puffs into the lungs every 6 (six) hours as needed for shortness of breath or wheezing. 08/02/18   [provider]  albuterol (VENTOLIN HFA) 108 (90 Base) MCG/ACT inhaler Inhale 2 puffs into the lungs every 6 (six) hours as needed for wheezing.  06/26/19   [provider]  amLODipine (NORVASC) 5 MG tablet Take 5 mg by mouth daily. 09/07/17   [provider]  amLODipine (NORVASC) 5 MG tablet Take 5 mg by mouth daily. 05/21/19   [provider]  Virgel BouquetBREO  ELLIPTA 100-25 MCG/INH AEPB Inhale 1 puff into the lungs daily as needed (sob/wheezing).  06/26/19   [provider]  cyclobenzaprine (FLEXERIL) 10 MG tablet Take 1 tablet (10 mg total) by mouth 2 (two) times daily as needed for muscle spasms. 08/01/19   Linwood Dibbles, MD  fluticasone (FLONASE) 50 MCG/ACT nasal spray Place 2 sprays into both nostrils daily. 11/04/19 11/03/20  Myra Rude, MD  hydrochlorothiazide (HYDRODIURIL) 12.5 MG tablet Take 12.5 mg by mouth daily. 09/07/17   [provider]  ibuprofen (ADVIL) 800 MG tablet Take 1 tablet (800 mg total) by mouth every 8 (eight) hours as needed for moderate pain. 12/03/19   Rancour, Jeannett Senior, MD  losartan (COZAAR) 50 MG tablet Take 50 mg by mouth daily. 09/07/17   [provider]  losartan (COZAAR) 50 MG tablet Take 50 mg by mouth daily. 05/21/19   [provider]  methocarbamol (ROBAXIN) 500 MG tablet Take 1 tablet (500 mg total) by mouth every 8 (eight) hours as needed for muscle spasms. 12/03/19   Rancour, Jeannett Senior, MD  naproxen (NAPROSYN) 375 MG tablet Take 1 tablet (375 mg total) by mouth 2 (two) times daily. 08/01/19   Linwood Dibbles, MD  omeprazole (PRILOSEC) 20 MG capsule Take 20 mg by mouth daily. 04/02/19   [provider]  ondansetron (ZOFRAN ODT) 4 MG disintegrating tablet Take 1 tablet (4 mg total) by mouth every 6 (six) hours as needed. 11/17/19   Ward, Layla Maw, DO  ondansetron (ZOFRAN) 4 MG tablet Take 1 tablet (4 mg total) by mouth daily as needed for nausea or vomiting. 12/07/19 12/06/20  Alphonzo Severance, MD  pravastatin (PRAVACHOL) 80 MG tablet Take 80 mg by mouth daily. 03/16/18 07/04/19  [provider]  pravastatin (PRAVACHOL) 80 MG tablet Take 80 mg by mouth daily. 05/21/19   [provider]  predniSONE (DELTASONE) 50 MG tablet Take 1 tablet (50 mg total) by mouth daily. 04/17/19   Lawyer, Cristal Deer, PA-C  promethazine-dextromethorphan (PROMETHAZINE-DM) 6.25-15 MG/5ML syrup Take 5 mLs by mouth every 6 (six) hours as needed. 07/03/19   [provider]  traMADol (ULTRAM) 50 MG tablet Take 1 tablet (50 mg total) by mouth every 6 (six) hours as needed for severe pain. 04/17/19   Lawyer, Cristal Deer, PA-C    Allergies    Lisinopril and Lisinopril  Review of Systems   Review of Systems  All other systems reviewed and are negative.  10 systems reviewed and are negative for acute changes, except as noted in the HPI.  Physical Exam Updated Vital Signs BP (!) 144/78 (BP Location: Left Arm)   Pulse (!) 110   Temp 98.6 F (37 C) (Oral)   Resp 22   Ht 5' (1.524 m)   Wt 72.6 kg   SpO2 94%   BMI 31.25 kg/m   Physical Exam Constitutional:      General: She is not in acute distress.    Comments: Tired-appearing woman resting in bed, coughs frequently  HENT:     Head: Normocephalic and atraumatic.       Mouth/Throat:     Mouth: Mucous membranes are moist.     Pharynx: No posterior oropharyngeal erythema.  Cardiovascular:     Rate and Rhythm: Regular rhythm. Tachycardia present.     Pulses: Normal pulses.     Heart sounds: Normal heart sounds.  Pulmonary:     Effort: Pulmonary effort is normal. No respiratory distress.     Comments: Diffuse expiratory wheezes Chest:  Chest wall: No tenderness.  Abdominal:     Palpations: Abdomen is soft.     Tenderness: There is no abdominal tenderness.  Skin:    General: Skin is warm and dry.  Neurological:     Mental Status: She is alert and oriented to person, place, and time.     ED Results / Procedures / Treatments   Labs (all labs ordered are listed, but only abnormal results are displayed) Labs Reviewed  BASIC METABOLIC PANEL - Abnormal; Notable for the following components:      Result Value   Glucose, Bld 114 (*)    Calcium 8.5 (*)    All other components within normal limits  CBC    EKG EKG Interpretation  Date/Time:  Saturday December 07 2019 16:47:43 EDT Ventricular Rate:  118 PR Interval:    QRS Duration: 93 QT Interval:  324 QTC Calculation: 454 R Axis:   81 Text Interpretation: Sinus tachycardia Low voltage, precordial leads Borderline T abnormalities, anterior leads No significant change since last tracing Confirmed by Jacalyn Lefevre [phone]) on 12/07/2019 4:51:40 PM   Radiology DG Chest 2 View  Result Date: 12/07/2019 CLINICAL DATA:  59 year old female with history of shortness of breath since last night. EXAM: CHEST - 2 VIEW COMPARISON:  Chest x-ray 12/03/2019. FINDINGS: Lung volumes are normal. No consolidative airspace disease. No pleural effusions. No pneumothorax. No pulmonary nodule or mass noted. Pulmonary vasculature and the cardiomediastinal silhouette are within normal limits. Atherosclerosis in the thoracic aorta. IMPRESSION: 1.  No radiographic evidence of acute cardiopulmonary disease. 2. Aortic  atherosclerosis. Electronically Signed   By: Trudie Reed M.D.   On: 12/07/2019 09:55    Medications Ordered in ED Medications  albuterol (VENTOLIN HFA) 108 (90 Base) MCG/ACT inhaler 4 puff (4 puffs Inhalation Given 12/07/19 1801)  AeroChamber Plus Flo-Vu Large MISC 1 each (1 each Other Given 12/07/19 1801)  ibuprofen (ADVIL) tablet 600 mg (600 mg Oral Given 12/07/19 1800)    ED Course  I have reviewed the triage vital signs and the nursing notes.  Pertinent labs & imaging results that were available during my care of the patient were reviewed by me and considered in my medical decision making (see chart for details).    MDM Rules/Calculators/A&P                          Shakeera Rightmyer is a 59 y.o. woman with history of asthma, HTN, hyperlipidemia and over 20 pack-year smoking history who presents with a four-day history of subjective fever/chills, cough, and congestion.  The patient is tired- but overall well-appearing. She is afebrile with mild tachycardia, oxygen saturation ~94% on room air, which patient states is her baseline. Diffuse wheezes on exam. The patient is vaccinated against COVID. CBC, BMP, EKG, and CXR unremarkable. Suspect viral respiratory illness leading to mild asthma exacerbation. Patient treated with albuterol with good effect, tolerating PO. Patient discharged home with albuterol inhaler, Zofran prescription, return precautions, and instructions to follow-up with her PCP for close follow-up.   Final Clinical Impression(s) / ED Diagnoses Final diagnoses:  Nonspecific syndrome suggestive of viral illness    Rx / DC Orders ED Discharge Orders         Ordered    ondansetron (ZOFRAN) 4 MG tablet  Daily PRN,   Status:  Discontinued     Reprint     12/07/19 1844    ondansetron (ZOFRAN) 4 MG tablet  Daily PRN  Discontinue  Reprint     12/07/19 1849           Alphonzo Severance, MD 12/07/19 2012    Jacalyn Lefevre, MD 12/08/19 [phone]

## 2019-12-07 NOTE — ED Triage Notes (Signed)
Pt here via EMS for dry cough and fever since Tuesday. Increased shortness of breath this morning. Hx of asthma, has been out of inhaler. Fully vaccinated for covid. 93% SpO2 room air, which pt sts is normal for her.

## 2019-12-07 NOTE — Discharge Instructions (Signed)
Your lab work and chest xray were reassuring. We treated you with albuterol. You may take the albuterol and spacer home to use as needed. We are also prescribing you an anti-nausea medicine called ondansetron to use as needed. Get rest and stay hydrated.   Please contact your primary care physician in the next business day to let them know you were seen in the emergency department and to schedule a follow-up appointment. If your condition worsens, please return to the emergency department for evaluation.

## 2019-12-08 ENCOUNTER — Other Ambulatory Visit: Payer: Self-pay

## 2019-12-08 ENCOUNTER — Emergency Department (HOSPITAL_COMMUNITY)
Admission: EM | Admit: 2019-12-08 | Discharge: 2019-12-09 | Disposition: A | Discharge status: Home / Self Care | Payer: Medicare Other | Attending: Emergency Medicine | Admitting: Emergency Medicine

## 2019-12-08 DIAGNOSIS — Z20822 Contact with and (suspected) exposure to covid-19: Secondary | ICD-10-CM | POA: Diagnosis not present

## 2019-12-08 DIAGNOSIS — J441 Chronic obstructive pulmonary disease with (acute) exacerbation: Secondary | ICD-10-CM | POA: Diagnosis not present

## 2019-12-08 DIAGNOSIS — F17210 Nicotine dependence, cigarettes, uncomplicated: Secondary | ICD-10-CM | POA: Diagnosis not present

## 2019-12-08 DIAGNOSIS — Z7951 Long term (current) use of inhaled steroids: Secondary | ICD-10-CM | POA: Diagnosis not present

## 2019-12-08 DIAGNOSIS — I10 Essential (primary) hypertension: Secondary | ICD-10-CM | POA: Diagnosis not present

## 2019-12-08 DIAGNOSIS — Z79899 Other long term (current) drug therapy: Secondary | ICD-10-CM | POA: Diagnosis not present

## 2019-12-08 DIAGNOSIS — R05 Cough: Secondary | ICD-10-CM | POA: Diagnosis present

## 2019-12-08 MED ORDER — DM-GUAIFENESIN ER 30-600 MG PO TB12
1.0000 | ORAL_TABLET | Freq: Two times a day (BID) | ORAL | Status: DC
  Administered 2019-12-09: 1 via ORAL
  Filled 2019-12-08: qty 1

## 2019-12-08 MED ORDER — MAGNESIUM SULFATE 2 GM/50ML IV SOLN
2.0000 g | Freq: Once | INTRAVENOUS | Status: AC
  Administered 2019-12-09: 2 g via INTRAVENOUS
  Filled 2019-12-08: qty 50

## 2019-12-08 MED ORDER — ALBUTEROL SULFATE HFA 108 (90 BASE) MCG/ACT IN AERS
8.0000 | INHALATION_SPRAY | Freq: Once | RESPIRATORY_TRACT | Status: AC
  Administered 2019-12-09: 8 via RESPIRATORY_TRACT
  Filled 2019-12-08: qty 6.7

## 2019-12-08 MED ORDER — METHYLPREDNISOLONE SODIUM SUCC 125 MG IJ SOLR
125.0000 mg | Freq: Once | INTRAMUSCULAR | Status: AC
  Administered 2019-12-09: 125 mg via INTRAVENOUS
  Filled 2019-12-08: qty 2

## 2019-12-08 NOTE — ED Provider Notes (Signed)
[facility]-EMERGENCY DEPT Provider Note   CSN: [id number] Arrival date & time: 12/08/19  2253   Time seen 11:10 PM  History Chief Complaint  Patient presents with  . Shortness of Breath  . Cough    Ashley Hodge is a 59 y.o. female.  HPI   Patient reports she was seen in the ED on Monday, July 19 when she was having pain in her upper back between her shoulder blades.  She was diagnosed with muscle spasms and treated.  She left early in the morning on July 20.  She states later that same day she woke up having a lot of cough.  She has a pulse oximeter at home and states her oxygen has been up and down and the lowest it was was 87 and her heart rate has been going up and down and the highest was 128.  She started having fevers and states her highest fever was 102 on July 21.  She woke up feeling very short of breath yesterday.  She also describes wheezing, sore throat, some mild rhinorrhea and nausea.  She has not had vomiting.  She also states she already started having diarrhea yesterday which has improved.  She was seen in the ED yesterday and had chest x-ray and CTA of the chest done because she had a mildly elevated D-dimer.  She was treated with a albuterol inhaler.  She called EMS tonight for persistent nonproductive cough and feeling short of breath.  They report her pulse ox was in the 80s and started her on nasal cannula oxygen.  Patient states she has a history of reactive airway disease but has not had to use an inhaler in the past year and a half.  She does smoke a half a pack a day.  She was unaware that she also had a diagnosis of COPD.  She states she had the Pfizer vaccine in March and in May.  She lives with her adult sons 1 of which does smoke.  PCP Karl Ito, DO   Past Medical History:  Diagnosis Date  . Anxiety   . Asthma   . COPD (chronic obstructive pulmonary disease) (HCC)   . High cholesterol   . Hypertension     There are no problems to  display for this patient.   Past Surgical History:  Procedure Laterality Date  . TUBAL LIGATION       OB History    Gravida      Para      Term      Preterm      AB      Living  5     SAB      TAB      Ectopic      Multiple      Live Births              Family History  Problem Relation Age of Onset  . Heart disease Mother   . Hypertension Mother     Social History   Tobacco Use  . Smoking status: Current Every Day Smoker    Packs/day: 1.00    Types: Cigarettes  . Smokeless tobacco: Never Used  Vaping Use  . Vaping Use: Never used  Substance Use Topics  . 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 Drug use: Never    Home Medications Prior to Admission medications   Medication Sig Start Date End Date Taking? Authorizing  Provider  albuterol (PROVENTIL HFA;VENTOLIN HFA) 108 (90 Base) MCG/ACT inhaler Inhale 1-2 puffs into the lungs every 6 (six) hours as needed for shortness of breath or wheezing. 08/02/18   [provider]  albuterol (VENTOLIN HFA) 108 (90 Base) MCG/ACT inhaler Inhale 2 puffs into the lungs every 6 (six) hours as needed for wheezing.  06/26/19   [provider]  amLODipine (NORVASC) 5 MG tablet Take 5 mg by mouth daily. 09/07/17   [provider]  amLODipine (NORVASC) 5 MG tablet Take 5 mg by mouth daily. 05/21/19   [provider]  BREO ELLIPTA 100-25 MCG/INH AEPB Inhale 1 puff into the lungs daily as needed (sob/wheezing).  06/26/19   [provider]  cyclobenzaprine (FLEXERIL) 10 MG tablet Take 1 tablet (10 mg total) by mouth 2 (two) times daily as needed for muscle spasms. 08/01/19   Linwood DibblesKnapp, Jon, MD  fluticasone (FLONASE) 50 MCG/ACT nasal spray Place 2 sprays into both nostrils daily. 11/04/19 11/03/20  Myra RudeSchmitz, Jeremy E, MD  hydrochlorothiazide (HYDRODIURIL) 12.5 MG tablet Take 12.5 mg by mouth daily. 09/07/17   [provider]  ibuprofen (ADVIL) 800 MG tablet Take 1 tablet  (800 mg total) by mouth every 8 (eight) hours as needed for moderate pain. 12/03/19   Rancour, Jeannett SeniorStephen, MD  losartan (COZAAR) 50 MG tablet Take 50 mg by mouth daily. 09/07/17   [provider]  losartan (COZAAR) 50 MG tablet Take 50 mg by mouth daily. 05/21/19   [provider]  methocarbamol (ROBAXIN) 500 MG tablet Take 1 tablet (500 mg total) by mouth every 8 (eight) hours as needed for muscle spasms. 12/03/19   Rancour, Jeannett SeniorStephen, MD  naproxen (NAPROSYN) 375 MG tablet Take 1 tablet (375 mg total) by mouth 2 (two) times daily. 08/01/19   Linwood DibblesKnapp, Jon, MD  omeprazole (PRILOSEC) 20 MG capsule Take 20 mg by mouth daily. 04/02/19   [provider]  ondansetron (ZOFRAN ODT) 4 MG disintegrating tablet Take 1 tablet (4 mg total) by mouth every 6 (six) hours as needed. 11/17/19   Ward, Layla MawKristen N, DO  ondansetron (ZOFRAN) 4 MG tablet Take 1 tablet (4 mg total) by mouth daily as needed for nausea or vomiting. 12/07/19 12/06/20  Alphonzo SeveranceWatson, Julia, MD  pravastatin (PRAVACHOL) 80 MG tablet Take 80 mg by mouth daily. 03/16/18 07/04/19  [provider]  pravastatin (PRAVACHOL) 80 MG tablet Take 80 mg by mouth daily. 05/21/19   [provider]  predniSONE (DELTASONE) 20 MG tablet Take 3 po QD x 3d , then 2 po QD x 3d then 1 po QD x 3d 12/09/19   Devoria AlbeKnapp, Dayden Viverette, MD  promethazine-dextromethorphan (PROMETHAZINE-DM) 6.25-15 MG/5ML syrup Take 5 mLs by mouth every 6 (six) hours as needed. 07/03/19   [provider]  traMADol (ULTRAM) 50 MG tablet Take 1 tablet (50 mg total) by mouth every 6 (six) hours as needed for severe pain. 04/17/19   Lawyer, Cristal Deerhristopher, PA-C    Allergies    Lisinopril and Lisinopril  Review of Systems   Review of Systems  All other systems reviewed and are negative.   Physical Exam Updated Vital Signs BP (!) 124/63   Pulse (!) 107   Temp 98.8 F (37.1 C) (Oral)   Resp 16   Ht 5' (1.524 m)   Wt 72.6 kg   SpO2 95%   BMI 31.25 kg/m   Physical Exam Vitals  and nursing note reviewed.  Constitutional:      General: She is in acute  distress.     Appearance: Normal appearance. She is normal weight.  HENT:     Head: Normocephalic and atraumatic.     Right Ear: External ear normal.     Left Ear: External ear normal.     Nose: Nose normal.     Mouth/Throat:     Mouth: Mucous membranes are dry.     Pharynx: No oropharyngeal exudate or posterior oropharyngeal erythema.  Eyes:     Extraocular Movements: Extraocular movements intact.     Conjunctiva/sclera: Conjunctivae normal.     Pupils: Pupils are equal, round, and reactive to light.  Cardiovascular:     Rate and Rhythm: Regular rhythm. Tachycardia present.  Pulmonary:     Effort: Tachypnea, accessory muscle usage and prolonged expiration present.     Breath sounds: Decreased air movement present. Wheezing present.     Comments: Patient is noted to have frequent episodes of dry tight coughing. Musculoskeletal:        General: Normal range of motion.     Cervical back: Normal range of motion.  Skin:    General: Skin is warm and dry.  Neurological:     General: No focal deficit present.     Mental Status: She is alert and oriented to person, place, and time.     Cranial Nerves: No cranial nerve deficit.  Psychiatric:        Mood and Affect: Mood normal.        Behavior: Behavior normal.        Thought Content: Thought content normal.     ED Results / Procedures / Treatments   Labs (all labs ordered are listed, but only abnormal results are displayed)  Results for orders placed or performed during the hospital encounter of 12/08/19  SARS Coronavirus 2 by RT PCR (hospital order, performed in [facility] hospital lab) Nasopharyngeal Nasopharyngeal Swab   Specimen: Nasopharyngeal Swab  Result Value Ref Range   SARS Coronavirus 2 NEGATIVE NEGATIVE    Laboratory interpretation all normal      Results for orders placed or performed during the hospital encounter of 12/07/19  CBC    Result Value Ref Range   WBC 10.1 4.0 - 10.5 K/uL   RBC 4.53 3.87 - 5.11 MIL/uL   Hemoglobin 13.7 12.0 - 15.0 g/dL   HCT 78.5 36 - 46 %   MCV 93.8 80.0 - 100.0 fL   MCH 30.2 26.0 - 34.0 pg   MCHC 32.2 30.0 - 36.0 g/dL   RDW 88.5 02.7 - 74.1 %   Platelets 205 150 - 400 K/uL   nRBC 0.0 0.0 - 0.2 %  Basic metabolic panel  Result Value Ref Range   Sodium 137 135 - 145 mmol/L   Potassium 3.5 3.5 - 5.1 mmol/L   Chloride 99 98 - 111 mmol/L   CO2 27 22 - 32 mmol/L   Glucose, Bld 114 (H) 70 - 99 mg/dL   BUN 7 6 - 20 mg/dL   Creatinine, Ser 2.87 0.44 - 1.00 mg/dL   Calcium 8.5 (L) 8.9 - 10.3 mg/dL   GFR calc non Af Amer >60 >60 mL/min   GFR calc Af Amer >60 >60 mL/min   Anion gap 11 5 - 15     EKG None  Radiology DG Chest 2 View  Result Date: 12/07/2019 CLINICAL DATA:  59 year old female with history of shortness of breath since last night.  IMPRESSION: 1.  No radiographic evidence of acute cardiopulmonary disease. 2. Aortic atherosclerosis.  Electronically Signed   By: Trudie Reed M.D.   On: 12/07/2019 09:55     DG Chest 2 View  Result Date: 12/03/2019 CLINICAL DATA:  Bilateral shoulder pain for 2 weeks. EXAM: CHEST - 2 VIEW COMPARISON:  November 19, 2019 FINDINGS: There is no evidence of acute infiltrate, pleural effusion or pneumothorax. A very faint, ill-defined 0.9 cm area of increased opacification is seen overlying the lateral aspect of the upper left lung. This represents a new finding when compared to the prior study. The heart size and mediastinal contours are within normal limits. The visualized skeletal structures are unremarkable. IMPRESSION: Very faint, ill-defined area of increased opacification overlying the lateral aspect of the upper left lung. A small area of atelectasis cannot be excluded. Follow-up to resolution is recommended. Electronically Signed   By: Aram Candela M.D.   On: 12/03/2019 02:14   DG Chest 2 View  Result Date: 11/19/2019 CLINICAL DATA:   Cough for 1 week with chest pain . IMPRESSION: No active cardiopulmonary disease. Electronically Signed   By: Alcide Clever M.D.   On: 11/19/2019 02:06   CT Angio Chest PE W and/or Wo Contrast  Result Date: 12/03/2019 CLINICAL DATA:  Chest and shoulder pain. Positive D-dimer.  IMPRESSION: 1. No CT findings for pulmonary embolism. 2. Normal caliber thoracic aorta and no dissection. 3. Changes of centrilobar emphysema. 4. Mild left upper lobe bronchitis.  No infiltrates. 5. Emphysema and aortic atherosclerosis. Aortic Atherosclerosis (ICD10-I70.0) and Emphysema (ICD10-J43.9). Aortic Atherosclerosis (ICD10-I70.0) and Emphysema (ICD10-J43.9). Electronically Signed   By: Rudie Meyer M.D.   On: 12/03/2019 05:32   Procedures Procedures (including critical care time)  Medications Ordered in ED Medications  dextromethorphan-guaiFENesin (MUCINEX DM) 30-600 MG per 12 hr tablet 1 tablet (1 tablet Oral Given 12/09/19 0007)  methylPREDNISolone sodium succinate (SOLU-MEDROL) 125 mg/2 mL injection 125 mg (125 mg Intravenous Given 12/09/19 0006)  magnesium sulfate IVPB 2 g 50 mL (0 g Intravenous Stopped 12/09/19 0107)  albuterol (VENTOLIN HFA) 108 (90 Base) MCG/ACT inhaler 8 puff (8 puffs Inhalation Given 12/09/19 0005)  albuterol (PROVENTIL) (2.5 MG/3ML) 0.083% nebulizer solution 10 mg (10 mg Nebulization Given 12/09/19 0302)    ED Course  I have reviewed the triage vital signs and the nursing notes.  Pertinent labs & imaging results that were available during my care of the patient were reviewed by me and considered in my medical decision making (see chart for details).    MDM Rules/Calculators/A&P                         Patient was started on IV Solu-Medrol, after checking her recent labs and she had a normal BUN and creatinine she was given 2 g of magnesium IV and she was given Mucinex DM for her cough.  She received 8 puffs of her albuterol inhaler.  Covid testing was done because she has not had one done  recently so that she can get a albuterol nebulizer.  Recheck at 12:55 AM patient is lying flat in bed resting peacefully.  She states she feels much better.  Her pulse ox is 93 to 95% which she states is her usual.  When I listen to her now she has lower pitched wheezing diffusely and improved air movement.  She was given the option of waiting for her Covid test to return and do an albuterol nebulizer or repeating the albuterol inhaler however she states she had gotten her albuterol inhaler puffs only  about 15 minutes prior.  I will wait a little while longer for her Covid test to result.  Recheck at 3:00 AM patient's Covid came back negative, she is just starting her continuous nebulizer of albuterol.  I discussed with her I wiill check her after her treatment was finished to decide if she needs to be admitted.    Recheck 4:10 AM patient states she feels much better.  She has finished her continuous nebulizer.  When I listen to her she now has some very low pitched rhonchi.  Patient does not want to do another nebulizer.  She feels good enough to go home.  She states her pulse ox is normally 92 to 94% which is where she is now.  She states she was able to ambulate to the bathroom and did not feel short of breath.  She states she can feel her heart is racing and we discussed that was from the albuterol.  She also states she had been shaky but that is improved.  She states she has an appointment already with her primary care doctor on the 28th.   Labs and chest x-ray were not repeated tonight because she had them already less than 24 hours ago.  Final Clinical Impression(s) / ED Diagnoses Final diagnoses:  COPD exacerbation (HCC)    Rx / DC Orders ED Discharge Orders         Ordered    predniSONE (DELTASONE) 20 MG tablet     Discontinue  Reprint     12/09/19 0421        mucinex DM OTC Albuterol inhaler  Plan discharge  Devoria Albe, MD, Concha Pyo, MD 12/09/19 [phone]

## 2019-12-08 NOTE — ED Triage Notes (Signed)
Pt c/o non-productive cough and SOB since Wednesday, reports feeling weak and nauseated as well, seen at [facility] and given albuterol inhaler but states she is not feeling better, also reports fevers. High 80's on RA with EMS and placed on 2L [place]

## 2019-12-09 DIAGNOSIS — J441 Chronic obstructive pulmonary disease with (acute) exacerbation: Secondary | ICD-10-CM | POA: Diagnosis not present

## 2019-12-09 LAB — SARS CORONAVIRUS 2 BY RT PCR (HOSPITAL ORDER, PERFORMED IN ~~LOC~~ HOSPITAL LAB): SARS Coronavirus 2: NEGATIVE

## 2019-12-09 MED ORDER — PREDNISONE 20 MG PO TABS
ORAL_TABLET | ORAL | 0 refills | Status: DC
Start: 2019-12-09 — End: 2019-12-11

## 2019-12-09 MED ORDER — ALBUTEROL SULFATE (2.5 MG/3ML) 0.083% IN NEBU
10.0000 mg | INHALATION_SOLUTION | Freq: Once | RESPIRATORY_TRACT | Status: AC
  Administered 2019-12-09: 10 mg via RESPIRATORY_TRACT
  Filled 2019-12-09: qty 12

## 2019-12-09 NOTE — Discharge Instructions (Addendum)
Use the inhaler with the spacer 2 puffs every 4 hours as needed for shortness of breath.  Take the prednisone as directed until gone.  You can take Mucinex DM over-the-counter for your cough.  Keep your appointment on July 28 with your doctor.  Return to the emergency room if you struggle to breathe again.

## 2019-12-10 ENCOUNTER — Observation Stay (HOSPITAL_COMMUNITY)
Admission: EM | Admit: 2019-12-10 | Discharge: 2019-12-11 | Disposition: A | Discharge status: Home / Self Care | Payer: Medicare Other | Attending: Internal Medicine | Admitting: Internal Medicine

## 2019-12-10 ENCOUNTER — Other Ambulatory Visit: Payer: Self-pay

## 2019-12-10 ENCOUNTER — Encounter (HOSPITAL_COMMUNITY): Payer: Self-pay | Admitting: *Deleted

## 2019-12-10 DIAGNOSIS — F17210 Nicotine dependence, cigarettes, uncomplicated: Secondary | ICD-10-CM | POA: Diagnosis not present

## 2019-12-10 DIAGNOSIS — J9611 Chronic respiratory failure with hypoxia: Secondary | ICD-10-CM | POA: Diagnosis not present

## 2019-12-10 DIAGNOSIS — J441 Chronic obstructive pulmonary disease with (acute) exacerbation: Secondary | ICD-10-CM | POA: Diagnosis not present

## 2019-12-10 DIAGNOSIS — I10 Essential (primary) hypertension: Secondary | ICD-10-CM | POA: Diagnosis not present

## 2019-12-10 DIAGNOSIS — E785 Hyperlipidemia, unspecified: Secondary | ICD-10-CM | POA: Insufficient documentation

## 2019-12-10 DIAGNOSIS — R0602 Shortness of breath: Secondary | ICD-10-CM | POA: Diagnosis present

## 2019-12-10 DIAGNOSIS — E10319 Type 1 diabetes mellitus with unspecified diabetic retinopathy without macular edema: Secondary | ICD-10-CM

## 2019-12-10 DIAGNOSIS — Z79899 Other long term (current) drug therapy: Secondary | ICD-10-CM | POA: Insufficient documentation

## 2019-12-10 DIAGNOSIS — R7303 Prediabetes: Secondary | ICD-10-CM

## 2019-12-10 DIAGNOSIS — Z20822 Contact with and (suspected) exposure to covid-19: Secondary | ICD-10-CM | POA: Insufficient documentation

## 2019-12-10 DIAGNOSIS — J9601 Acute respiratory failure with hypoxia: Secondary | ICD-10-CM

## 2019-12-10 DIAGNOSIS — E876 Hypokalemia: Secondary | ICD-10-CM | POA: Insufficient documentation

## 2019-12-10 DIAGNOSIS — R0902 Hypoxemia: Secondary | ICD-10-CM

## 2019-12-10 DIAGNOSIS — J449 Chronic obstructive pulmonary disease, unspecified: Secondary | ICD-10-CM | POA: Diagnosis present

## 2019-12-10 LAB — CBC WITH DIFFERENTIAL/PLATELET
Abs Immature Granulocytes: 0.08 10*3/uL — ABNORMAL HIGH (ref 0.00–0.07)
Basophils Absolute: 0 10*3/uL (ref 0.0–0.1)
Basophils Relative: 0 %
Eosinophils Absolute: 0 10*3/uL (ref 0.0–0.5)
Eosinophils Relative: 0 %
HCT: 42.8 % (ref 36.0–46.0)
Hemoglobin: 14 g/dL (ref 12.0–15.0)
Immature Granulocytes: 0 %
Lymphocytes Relative: 5 %
Lymphs Abs: 1 10*3/uL (ref 0.7–4.0)
MCH: 31.1 pg (ref 26.0–34.0)
MCHC: 32.7 g/dL (ref 30.0–36.0)
MCV: 95.1 fL (ref 80.0–100.0)
Monocytes Absolute: 0.4 10*3/uL (ref 0.1–1.0)
Monocytes Relative: 2 %
Neutro Abs: 16.5 10*3/uL — ABNORMAL HIGH (ref 1.7–7.7)
Neutrophils Relative %: 93 %
Platelets: 298 10*3/uL (ref 150–400)
RBC: 4.5 MIL/uL (ref 3.87–5.11)
RDW: 11.9 % (ref 11.5–15.5)
WBC: 17.9 10*3/uL — ABNORMAL HIGH (ref 4.0–10.5)
nRBC: 0 % (ref 0.0–0.2)

## 2019-12-10 LAB — SARS CORONAVIRUS 2 BY RT PCR (HOSPITAL ORDER, PERFORMED IN ~~LOC~~ HOSPITAL LAB): SARS Coronavirus 2: NEGATIVE

## 2019-12-10 LAB — BASIC METABOLIC PANEL
Anion gap: 18 — ABNORMAL HIGH (ref 5–15)
BUN: 22 mg/dL — ABNORMAL HIGH (ref 6–20)
CO2: 23 mmol/L (ref 22–32)
Calcium: 8.8 mg/dL — ABNORMAL LOW (ref 8.9–10.3)
Chloride: 98 mmol/L (ref 98–111)
Creatinine, Ser: 0.97 mg/dL (ref 0.44–1.00)
GFR calc Af Amer: >60 mL/min (ref >60)
GFR calc non Af Amer: >60 mL/min (ref >60)
Glucose, Bld: 286 mg/dL — ABNORMAL HIGH (ref 70–99)
Potassium: 3.4 mmol/L — ABNORMAL LOW (ref 3.5–5.1)
Sodium: 139 mmol/L (ref 135–145)

## 2019-12-10 MED ORDER — SODIUM CHLORIDE 0.9 % IV BOLUS
500.0000 mL | Freq: Once | INTRAVENOUS | Status: AC
  Administered 2019-12-10: 500 mL via INTRAVENOUS

## 2019-12-10 MED ORDER — ENOXAPARIN SODIUM 40 MG/0.4ML ~~LOC~~ SOLN
40.0000 mg | SUBCUTANEOUS | Status: DC
  Filled 2019-12-10: qty 0.4

## 2019-12-10 MED ORDER — BUDESONIDE 0.25 MG/2ML IN SUSP
0.2500 mg | Freq: Two times a day (BID) | RESPIRATORY_TRACT | Status: DC
  Administered 2019-12-11: 0.25 mg via RESPIRATORY_TRACT
  Filled 2019-12-10: qty 2

## 2019-12-10 MED ORDER — PRAVASTATIN SODIUM 40 MG PO TABS
80.0000 mg | ORAL_TABLET | Freq: Every day | ORAL | Status: DC
  Administered 2019-12-11: 80 mg via ORAL
  Filled 2019-12-10: qty 2

## 2019-12-10 MED ORDER — ALBUTEROL SULFATE (2.5 MG/3ML) 0.083% IN NEBU
5.0000 mg | INHALATION_SOLUTION | Freq: Once | RESPIRATORY_TRACT | Status: AC
  Administered 2019-12-10: 5 mg via RESPIRATORY_TRACT
  Filled 2019-12-10: qty 6

## 2019-12-10 MED ORDER — GUAIFENESIN ER 600 MG PO TB12
600.0000 mg | ORAL_TABLET | Freq: Two times a day (BID) | ORAL | Status: DC
  Administered 2019-12-11 (×2): 600 mg via ORAL
  Filled 2019-12-10 (×2): qty 1

## 2019-12-10 MED ORDER — NICOTINE 21 MG/24HR TD PT24
21.0000 mg | MEDICATED_PATCH | Freq: Every day | TRANSDERMAL | Status: DC
  Filled 2019-12-10: qty 1

## 2019-12-10 MED ORDER — MAGNESIUM SULFATE 2 GM/50ML IV SOLN
2.0000 g | Freq: Once | INTRAVENOUS | Status: AC
  Administered 2019-12-10: 2 g via INTRAVENOUS
  Filled 2019-12-10: qty 50

## 2019-12-10 MED ORDER — ALBUTEROL (5 MG/ML) CONTINUOUS INHALATION SOLN
10.0000 mg/h | INHALATION_SOLUTION | Freq: Once | RESPIRATORY_TRACT | Status: AC
  Administered 2019-12-10: 10 mg/h via RESPIRATORY_TRACT
  Filled 2019-12-10: qty 20

## 2019-12-10 MED ORDER — METHYLPREDNISOLONE SODIUM SUCC 125 MG IJ SOLR
60.0000 mg | Freq: Four times a day (QID) | INTRAMUSCULAR | Status: DC
  Administered 2019-12-11 (×2): 60 mg via INTRAVENOUS
  Filled 2019-12-10 (×2): qty 2

## 2019-12-10 MED ORDER — IPRATROPIUM-ALBUTEROL 0.5-2.5 (3) MG/3ML IN SOLN
3.0000 mL | Freq: Four times a day (QID) | RESPIRATORY_TRACT | Status: DC
  Administered 2019-12-11 (×2): 3 mL via RESPIRATORY_TRACT
  Filled 2019-12-10 (×2): qty 3

## 2019-12-10 MED ORDER — DEXAMETHASONE SODIUM PHOSPHATE 10 MG/ML IJ SOLN
10.0000 mg | Freq: Once | INTRAMUSCULAR | Status: AC
  Administered 2019-12-10: 10 mg via INTRAMUSCULAR
  Filled 2019-12-10: qty 1

## 2019-12-10 MED ORDER — SODIUM CHLORIDE 0.9 % IV SOLN
1.0000 g | INTRAVENOUS | Status: DC
  Administered 2019-12-11: 1 g via INTRAVENOUS
  Filled 2019-12-10 (×2): qty 10

## 2019-12-10 MED ORDER — AMLODIPINE BESYLATE 5 MG PO TABS
5.0000 mg | ORAL_TABLET | Freq: Every day | ORAL | Status: DC
  Administered 2019-12-11: 5 mg via ORAL
  Filled 2019-12-10: qty 1

## 2019-12-10 MED ORDER — HYDROCHLOROTHIAZIDE 12.5 MG PO CAPS
12.5000 mg | ORAL_CAPSULE | Freq: Every day | ORAL | Status: DC
  Administered 2019-12-11: 12.5 mg via ORAL
  Filled 2019-12-10: qty 1

## 2019-12-10 MED ORDER — LOSARTAN POTASSIUM 50 MG PO TABS
50.0000 mg | ORAL_TABLET | Freq: Every day | ORAL | Status: DC
  Administered 2019-12-11: 50 mg via ORAL
  Filled 2019-12-10: qty 1

## 2019-12-10 MED ORDER — IPRATROPIUM BROMIDE 0.02 % IN SOLN
1.0000 mg | Freq: Once | RESPIRATORY_TRACT | Status: AC
  Administered 2019-12-10: 1 mg via RESPIRATORY_TRACT
  Filled 2019-12-10: qty 5

## 2019-12-10 MED ORDER — POTASSIUM CHLORIDE CRYS ER 20 MEQ PO TBCR
40.0000 meq | EXTENDED_RELEASE_TABLET | Freq: Once | ORAL | Status: AC
  Administered 2019-12-10: 40 meq via ORAL
  Filled 2019-12-10: qty 2

## 2019-12-10 MED ORDER — ALBUTEROL SULFATE (2.5 MG/3ML) 0.083% IN NEBU
2.5000 mg | INHALATION_SOLUTION | RESPIRATORY_TRACT | Status: DC | PRN
  Administered 2019-12-11: 2.5 mg via RESPIRATORY_TRACT
  Filled 2019-12-10: qty 3

## 2019-12-10 NOTE — ED Provider Notes (Signed)
[facility]-EMERGENCY DEPT Provider Note   CSN: [id number] Arrival date & time: 12/10/19  1341     History Chief Complaint  Patient presents with  . Shortness of Breath    Ashley Hodge is a 59 y.o. female with a hx of asthma, COPD, HTN, high cholesterol presents to the Emergency Department complaining of gradual, persistent, progressively worsening shortness of breath onset earlier today.  Pt reports that her SPO2 at home was 83%.  She utilized her albuterol rescue without improvement. Pt reports she thinks she may benefit from home oxygen, but has never utilized it.  Pt reports she continues to smoke approximately half pack per day and does live with another smoker in the home.  Patient reports continued smoker's cough but not worse than usual.  Patient denies fevers, chills, sore throat, vomiting, diarrhea.  Reports Covid vaccine in March and May.  Patient reports she does not have a nebulizer at home.  Records reviewed.  Patient was evaluated on 7/24 with a normal chest x-ray and CTA of the chest.  She was evaluated on 7/25 for nonproductive cough and shortness of breath.  She had low oxygen saturations on arrival.  COPD treatments improved and patient was discharged home.  Covid test was negative on 7/25.  Patient prescribed prednisone which was filled yesterday and first dose was taken today.  The history is provided by the patient and medical records. No language interpreter was used.       Past Medical History:  Diagnosis Date  . Anxiety   . Asthma   . COPD (chronic obstructive pulmonary disease) (HCC)   . High cholesterol   . Hypertension     There are no problems to display for this patient.   Past Surgical History:  Procedure Laterality Date  . TUBAL LIGATION       OB History    Gravida      Para      Term      Preterm      AB      Living  5     SAB      TAB      Ectopic      Multiple      Live Births               Family History  Problem Relation Age of Onset  . Heart disease Mother   . Hypertension Mother     Social History   Tobacco Use  . Smoking status: Current Every Day Smoker    Packs/day: 1.00    Types: Cigarettes  . Smokeless tobacco: Never Used  Vaping Use  . Vaping Use: Never used  Substance Use Topics  . 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 Drug use: Never    Home Medications Prior to Admission medications   Medication Sig Start Date End Date Taking? Authorizing Provider  albuterol (PROVENTIL HFA;VENTOLIN HFA) 108 (90 Base) MCG/ACT inhaler Inhale 1-2 puffs into the lungs every 6 (six) hours as needed for shortness of breath or wheezing. 08/02/18  Yes [provider]  amLODipine (NORVASC) 5 MG tablet Take 5 mg by mouth daily. 09/07/17  Yes [provider]  hydrochlorothiazide (HYDRODIURIL) 12.5 MG tablet Take 12.5 mg by mouth daily. 09/07/17  Yes [provider]  losartan (COZAAR) 50 MG tablet Take 50 mg by mouth daily. 09/07/17  Yes [provider]  pravastatin (PRAVACHOL) 80 MG tablet Take 80  mg by mouth daily. 05/21/19  Yes [provider]  predniSONE (DELTASONE) 20 MG tablet Take 3 po QD x 3d , then 2 po QD x 3d then 1 po QD x 3d Patient taking differently: Take 20 mg by mouth See admin instructions. Take 3 po QD x 3d , then 2 po QD x 3d then 1 po QD x 3d 12/09/19  Yes Devoria Albe, MD  cyclobenzaprine (FLEXERIL) 10 MG tablet Take 1 tablet (10 mg total) by mouth 2 (two) times daily as needed for muscle spasms. Patient not taking: Reported on 12/10/2019 08/01/19   Linwood Dibbles, MD  fluticasone [facility]) 50 MCG/ACT nasal spray Place 2 sprays into both nostrils daily. Patient not taking: Reported on 12/10/2019 11/04/19 11/03/20  Myra Rude, MD  ibuprofen (ADVIL) 800 MG tablet Take 1 tablet (800 mg total) by mouth every 8 (eight) hours as needed for moderate pain. Patient not taking: Reported on 12/10/2019 12/03/19    Glynn Octave, MD  losartan (COZAAR) 50 MG tablet Take 50 mg by mouth daily. Patient not taking: Reported on 12/10/2019 05/21/19   [provider]  methocarbamol (ROBAXIN) 500 MG tablet Take 1 tablet (500 mg total) by mouth every 8 (eight) hours as needed for muscle spasms. Patient not taking: Reported on 12/10/2019 12/03/19   Glynn Octave, MD  naproxen (NAPROSYN) 375 MG tablet Take 1 tablet (375 mg total) by mouth 2 (two) times daily. Patient not taking: Reported on 12/10/2019 08/01/19   Linwood Dibbles, MD  ondansetron (ZOFRAN ODT) 4 MG disintegrating tablet Take 1 tablet (4 mg total) by mouth every 6 (six) hours as needed. Patient not taking: Reported on 12/10/2019 11/17/19   Ward, Layla Maw, DO  ondansetron (ZOFRAN) 4 MG tablet Take 1 tablet (4 mg total) by mouth daily as needed for nausea or vomiting. Patient not taking: Reported on 12/10/2019 12/07/19 12/06/20  Alphonzo Severance, MD  traMADol (ULTRAM) 50 MG tablet Take 1 tablet (50 mg total) by mouth every 6 (six) hours as needed for severe pain. Patient not taking: Reported on 12/10/2019 04/17/19   Charlestine Night, PA-C    Allergies    Lisinopril and Lisinopril  Review of Systems   Review of Systems  Constitutional: Negative for appetite change, diaphoresis, fatigue, fever and unexpected weight change.  HENT: Negative for mouth sores.   Eyes: Negative for visual disturbance.  Respiratory: Positive for cough, chest tightness, shortness of breath and wheezing.   Cardiovascular: Negative for chest pain.  Gastrointestinal: Negative for abdominal pain, constipation, diarrhea, nausea and vomiting.  Endocrine: Negative for polydipsia, polyphagia and polyuria.  Genitourinary: Negative for dysuria, frequency, hematuria and urgency.  Musculoskeletal: Negative for back pain and neck stiffness.  Skin: Negative for rash.  Allergic/Immunologic: Negative for immunocompromised state.  Neurological: Negative for syncope, light-headedness and  headaches.  Hematological: Does not bruise/bleed easily.  Psychiatric/Behavioral: Negative for sleep disturbance. The patient is not nervous/anxious.     Physical Exam Updated Vital Signs BP (!) 144/81 (BP Location: Left Arm)   Pulse 102   Temp 97.7 F (36.5 C) (Oral)   Resp 17   Ht 5' (1.524 m)   Wt 72.6 kg   SpO2 93%   BMI 31.25 kg/m   Physical Exam Vitals and nursing note reviewed.  Constitutional:      General: She is not in acute distress.    Appearance: She is not diaphoretic.  HENT:     Head: Normocephalic.  Eyes:     General: No scleral icterus.  Conjunctiva/sclera: Conjunctivae normal.  Cardiovascular:     Rate and Rhythm: Normal rate and regular rhythm.     Pulses: Normal pulses.          Radial pulses are 2+ on the right side and 2+ on the left side.  Pulmonary:     Effort: Tachypnea present. No accessory muscle usage, prolonged expiration, respiratory distress or retractions.     Breath sounds: No stridor. Decreased breath sounds and wheezing ( inspiratory and expiratory throughout) present.     Comments: Equal chest rise. No increased work of breathing. Abdominal:     General: There is no distension.     Palpations: Abdomen is soft.     Tenderness: There is no abdominal tenderness. There is no guarding or rebound.  Musculoskeletal:     Cervical back: Normal range of motion.     Comments: Moves all extremities equally and without difficulty.  Skin:    General: Skin is warm and dry.     Capillary Refill: Capillary refill takes less than 2 seconds.  Neurological:     Mental Status: She is alert.     GCS: GCS eye subscore is 4. GCS verbal subscore is 5. GCS motor subscore is 6.     Comments: Speech is clear and goal oriented.  Psychiatric:        Mood and Affect: Mood normal.     ED Results / Procedures / Treatments   Labs (all labs ordered are listed, but only abnormal results are displayed) Labs Reviewed  CBC WITH DIFFERENTIAL/PLATELET -  Abnormal; Notable for the following components:      Result Value   WBC 17.9 (*)    Neutro Abs 16.5 (*)    Abs Immature Granulocytes 0.08 (*)    All other components within normal limits  BASIC METABOLIC PANEL - Abnormal; Notable for the following components:   Potassium 3.4 (*)    Glucose, Bld 286 (*)    BUN 22 (*)    Calcium 8.8 (*)    Anion gap 18 (*)    All other components within normal limits  SARS CORONAVIRUS 2 BY RT PCR (HOSPITAL ORDER, PERFORMED IN [facility])    Procedures .Critical Care Performed by: Dierdre Forth, PA-C Authorized by: Dierdre Forth, PA-C   Critical care provider statement:    Critical care time (minutes):  45   Critical care time was exclusive of:  Separately billable procedures and treating other patients and teaching time   Critical care was necessary to treat or prevent imminent or life-threatening deterioration of the following conditions:  Respiratory failure   Critical care was time spent personally by me on the following activities:  Discussions with consultants, evaluation of patient's response to treatment, examination of patient, ordering and performing treatments and interventions, ordering and review of laboratory studies, ordering and review of radiographic studies, pulse oximetry, re-evaluation of patient's condition, obtaining history from patient or surrogate and review of old charts   I assumed direction of critical care for this patient from another provider in my specialty: no     (including critical care time)  Medications Ordered in ED Medications  albuterol (PROVENTIL) (2.5 MG/3ML) 0.083% nebulizer solution 5 mg (has no administration in time range)  sodium chloride 0.9 % bolus 500 mL (has no administration in time range)  potassium chloride SA (KLOR-CON) CR tablet 40 mEq (has no administration in time range)  albuterol (PROVENTIL,VENTOLIN) solution continuous neb (10 mg/hr Nebulization Given 12/10/19  1705)  dexamethasone (DECADRON) injection 10 mg (10 mg Intramuscular Given 12/10/19 2038)  ipratropium (ATROVENT) nebulizer solution 1 mg (1 mg Nebulization Given 12/10/19 1705)  magnesium sulfate IVPB 2 g 50 mL (2 g Intravenous New Bag/Given 12/10/19 2102)    ED Course  I have reviewed the triage vital signs and the nursing notes.  Pertinent labs & imaging results that were available during my care of the patient were reviewed by me and considered in my medical decision making (see chart for details).  Clinical Course as of Dec 10 2207  Tue Dec 10, 2019  1640 On 2l via [place]  SpO2(!): 89 % [HM]  2033 Oxygen dropped to 84% while walking.  Remains at 90% while at rest on 2 L via nasal cannula.  Patient will need admission for new oxygen requirement and COPD exacerbation.   [HM]    Clinical Course User Index [HM] Terri Malerba, Boyd Kerbs   MDM Rules/Calculators/A&P                           Presents with COPD exacerbation and hypoxia.  She was placed on oxygen here in the emergency department.  Wheezing throughout with increased work of breathing.  Nebulizer and steroids given.  Mild hypokalemia.  Elevated anion gap and leukocytosis.  Suspect leukocytosis is secondary to steroid usage over the last several days.  Patient afebrile here in the emergency department.  Chest x-ray not repeated as patient has been afebrile without worsening cough.  Recent chest x-rays without evidence of pneumonia.  Potassium given for hypokalemia.  Fluid bolus for anion gap.  Covid test pending.  9:54 PM Patient with significant improvement in breathing however remains hypoxic at rest with significant hypoxia with ambulation.  She does not have oxygen at home.  Repeat albuterol given along with magnesium without significant improvement.  She will need admission for COPD exacerbation and hypoxia.  10:09 PM Discussed patient's case with hospitalist, Dr. Loney Loh.  I have recommended admission and patient (and  family if present) agree with this plan. Admitting physician will place admission orders.    Final Clinical Impression(s) / ED Diagnoses Final diagnoses:  COPD exacerbation (HCC)  Hypoxia    Rx / DC Orders ED Discharge Orders    None       Brianna Bennett, Boyd Kerbs 12/10/19 2209    Charlynne Pander, MD 12/10/19 2249

## 2019-12-10 NOTE — Progress Notes (Addendum)
TOC CM received referral to assist with setting up oxygen. Unable to order oxygen from ED setting were insurance will cover. ED provider updated. Isidoro Donning RN CCM, [facility] ED TOC CM [phone]  12/10/2019 705 pm Spoke to ED provider, pt is possible observation for COPD exac. Pt has follow up appt with PCP tomorrow. TOC CM/CSW will continue to follow for dc needs. Isidoro Donning RN CCM, [facility] ED TOC CM [phone]

## 2019-12-10 NOTE — ED Notes (Signed)
Pt walked down the hall. Pt O2 stayed between 84-87 and HR around 130.

## 2019-12-10 NOTE — ED Triage Notes (Signed)
Pt reports she was recently told she has COPD and today stopped smoking. Patient denies pain, dizziness, or any other concern. Patient reports she just needs an order to have oxygen at home.

## 2019-12-10 NOTE — H&P (Signed)
History and Physical    Ashley JubaKathy Hodge NGE:[id number] DOB: [birth year] DOA: 12/10/2019  PCP: Karl ItoPatel, Nilay, DO Patient coming from: Home  Chief Complaint: Shortness of breath  HPI: Ashley JubaKathy Hodge is a 59 y.o. female with medical history significant of asthma, COPD, hypertension, hyperlipidemia, anxiety presenting with complaints of shortness of breath.  Patient reports 1 week history of shortness of breath, cough productive of white sputum, and wheezing.  Denies fevers, chills, or chest pain.  Reports smoking 1 pack of cigarettes daily x30 years.  States she was not aware that she had COPD and was not using any home inhalers.  States she was prescribed albuterol during her recent ED visit and also prescribed prednisone which she started taking today.  Patient has no other complaints.  ED Course: Afebrile.  Slightly tachycardic.  Wheezing on exam.  Oxygen saturation 84% with ambulation, improved with 2 L supplemental oxygen.  WBC count 17.9.  Potassium 3.4.  SARS-CoV-2 PCR test negative.  Patient was given albuterol continuous neb, Atrovent, Decadron, IV magnesium 2 g, potassium supplementation, and 500 cc normal saline bolus.  Review of Systems:  All systems reviewed and apart from history of presenting illness, are negative.  Past Medical History:  Diagnosis Date  . Anxiety   . Asthma   . COPD (chronic obstructive pulmonary disease) (HCC)   . High cholesterol   . Hypertension     Past Surgical History:  Procedure Laterality Date  . TUBAL LIGATION       reports that she has been smoking cigarettes. She has been smoking about 1.00 pack per day. She has never used smokeless tobacco. She reports previous alcohol use. She reports that she does not use drugs.  Allergies  Allergen Reactions  . Lisinopril Other (See Comments) and Cough    cough  . Lisinopril     Family History  Problem Relation Age of Onset  . Heart disease Mother   . Hypertension Mother     Prior to Admission  medications   Medication Sig Start Date End Date Taking? Authorizing Provider  albuterol (PROVENTIL HFA;VENTOLIN HFA) 108 (90 Base) MCG/ACT inhaler Inhale 1-2 puffs into the lungs every 6 (six) hours as needed for shortness of breath or wheezing. 08/02/18  Yes [provider]  amLODipine (NORVASC) 5 MG tablet Take 5 mg by mouth daily. 09/07/17  Yes [provider]  hydrochlorothiazide (HYDRODIURIL) 12.5 MG tablet Take 12.5 mg by mouth daily. 09/07/17  Yes [provider]  losartan (COZAAR) 50 MG tablet Take 50 mg by mouth daily. 09/07/17  Yes [provider]  pravastatin (PRAVACHOL) 80 MG tablet Take 80 mg by mouth daily. 05/21/19  Yes [provider]  predniSONE (DELTASONE) 20 MG tablet Take 3 po QD x 3d , then 2 po QD x 3d then 1 po QD x 3d Patient taking differently: Take 20 mg by mouth See admin instructions. Take 3 po QD x 3d , then 2 po QD x 3d then 1 po QD x 3d 12/09/19  Yes Devoria AlbeKnapp, Iva, MD  cyclobenzaprine (FLEXERIL) 10 MG tablet Take 1 tablet (10 mg total) by mouth 2 (two) times daily as needed for muscle spasms. Patient not taking: Reported on 12/10/2019 08/01/19   Linwood DibblesKnapp, Jon, MD  fluticasone [facility](FLONASE) 50 MCG/ACT nasal spray Place 2 sprays into both nostrils daily. Patient not taking: Reported on 12/10/2019 11/04/19 11/03/20  Myra RudeSchmitz, Jeremy E, MD  ibuprofen (ADVIL) 800 MG tablet Take 1 tablet (800 mg total) by mouth every 8 (eight)  hours as needed for moderate pain. Patient not taking: Reported on 12/10/2019 12/03/19   Glynn Octave, MD  losartan (COZAAR) 50 MG tablet Take 50 mg by mouth daily. Patient not taking: Reported on 12/10/2019 05/21/19   [provider]  methocarbamol (ROBAXIN) 500 MG tablet Take 1 tablet (500 mg total) by mouth every 8 (eight) hours as needed for muscle spasms. Patient not taking: Reported on 12/10/2019 12/03/19   Glynn Octave, MD  naproxen (NAPROSYN) 375 MG tablet Take 1 tablet (375 mg total) by mouth 2 (two) times  daily. Patient not taking: Reported on 12/10/2019 08/01/19   Linwood Dibbles, MD  ondansetron (ZOFRAN ODT) 4 MG disintegrating tablet Take 1 tablet (4 mg total) by mouth every 6 (six) hours as needed. Patient not taking: Reported on 12/10/2019 11/17/19   Ward, Layla Maw, DO  ondansetron (ZOFRAN) 4 MG tablet Take 1 tablet (4 mg total) by mouth daily as needed for nausea or vomiting. Patient not taking: Reported on 12/10/2019 12/07/19 12/06/20  Alphonzo Severance, MD  traMADol (ULTRAM) 50 MG tablet Take 1 tablet (50 mg total) by mouth every 6 (six) hours as needed for severe pain. Patient not taking: Reported on 12/10/2019 04/17/19   Charlestine Night, PA-C    Physical Exam: Vitals:   12/10/19 2042 12/10/19 2100 12/10/19 2217 12/10/19 2241  BP: (!) 138/66 (!) 107/54  (!) 132/80  Pulse: (!) 113 105  102  Resp: 17   16  Temp:      TempSrc:      SpO2: 90% 95% 92% 96%  Weight:      Height:        Physical Exam Constitutional:      General: She is not in acute distress. HENT:     Head: Normocephalic and atraumatic.     Mouth/Throat:     Mouth: Mucous membranes are moist.     Pharynx: Oropharynx is clear.  Eyes:     Extraocular Movements: Extraocular movements intact.     Conjunctiva/sclera: Conjunctivae normal.  Cardiovascular:     Rate and Rhythm: Normal rate and regular rhythm.     Pulses: Normal pulses.  Pulmonary:     Effort: Pulmonary effort is normal.     Breath sounds: Wheezing present. No rales.     Comments: Diffuse inspiratory and expiratory wheezing Abdominal:     General: Bowel sounds are normal. There is no distension.     Palpations: Abdomen is soft.     Tenderness: There is no abdominal tenderness. There is no guarding.  Musculoskeletal:        General: No swelling or tenderness.     Cervical back: Normal range of motion and neck supple.  Skin:    General: Skin is warm and dry.  Neurological:     General: No focal deficit present.     Mental Status: She is alert and oriented  to person, place, and time.     Labs on Admission: I have personally reviewed following labs and imaging studies  CBC: Recent Labs  Lab 12/07/19 0918 12/10/19 2030  WBC 10.1 17.9*  NEUTROABS  --  16.5*  HGB 13.7 14.0  HCT 42.5 42.8  MCV 93.8 95.1  PLT 205 298   Basic Metabolic Panel: Recent Labs  Lab 12/07/19 0918 12/10/19 2030  NA 137 139  K 3.5 3.4*  CL 99 98  CO2 27 23  GLUCOSE 114* 286*  BUN 7 22*  CREATININE 0.83 0.97  CALCIUM 8.5* 8.8*   GFR:  Estimated Creatinine Clearance: 55.5 mL/min (by C-G formula based on SCr of 0.97 mg/dL). Liver Function Tests: No results for input(s): AST, ALT, ALKPHOS, BILITOT, PROT, ALBUMIN in the last 168 hours. No results for input(s): LIPASE, AMYLASE in the last 168 hours. No results for input(s): AMMONIA in the last 168 hours. Coagulation Profile: No results for input(s): INR, PROTIME in the last 168 hours. Cardiac Enzymes: No results for input(s): CKTOTAL, CKMB, CKMBINDEX, TROPONINI in the last 168 hours. BNP (last 3 results) No results for input(s): PROBNP in the last 8760 hours. HbA1C: No results for input(s): HGBA1C in the last 72 hours. CBG: No results for input(s): GLUCAP in the last 168 hours. Lipid Profile: No results for input(s): CHOL, HDL, LDLCALC, TRIG, CHOLHDL, LDLDIRECT in the last 72 hours. Thyroid Function Tests: No results for input(s): TSH, T4TOTAL, FREET4, T3FREE, THYROIDAB in the last 72 hours. Anemia Panel: No results for input(s): VITAMINB12, FOLATE, FERRITIN, TIBC, IRON, RETICCTPCT in the last 72 hours. Urine analysis:    Component Value Date/Time   COLORURINE YELLOW 02/24/2018 1154   APPEARANCEUR HAZY (A) 02/24/2018 1154   LABSPEC 1.012 02/24/2018 1154   PHURINE 6.0 02/24/2018 1154   GLUCOSEU NEGATIVE 02/24/2018 1154   HGBUR NEGATIVE 02/24/2018 1154   BILIRUBINUR NEGATIVE 02/24/2018 1154   KETONESUR NEGATIVE 02/24/2018 1154   PROTEINUR NEGATIVE 02/24/2018 1154   NITRITE NEGATIVE 02/24/2018  1154   LEUKOCYTESUR LARGE (A) 02/24/2018 1154    Radiological Exams on Admission: No results found.  Assessment/Plan Principal Problem:   COPD with acute exacerbation (HCC) Active Problems:   Acute respiratory failure with hypoxia (HCC)   Hypokalemia   Essential hypertension   Hyperlipidemia  Acute hypoxic respiratory failure secondary to acute COPD exacerbation: Wheezing on exam.  Oxygen saturation 84% with ambulation, improved with 2 L supplemental oxygen.  Imaging not repeated today as she had a normal CT angiogram chest on 7/20 and chest x-ray on 7/24.  She was evaluated in the ED on 7/25 for similar symptoms and prescribed prednisone.  Labs showing leukocytosis, likely reactive in setting of steroid use.  SARS-CoV-2 PCR test done yesterday and today negative. -Solu-Medrol 60 mg every 6 hours, DuoNebs every 6 hours, albuterol nebulizer as needed, and Pulmicort nebulizer twice daily.  Ceftriaxone given 2/3 cardinal symptoms and severity of exacerbation. Mucinex for cough.  Continuous pulse ox, supplemental oxygen as needed.  Mild hypokalemia: Likely related to home diuretic use.  Potassium 3.4. -Receive potassium supplementation in the ED.  Check magnesium level and replete if low.  Continue to monitor electrolytes.  Hypertension: Stable.  Currently normotensive. -Continue home amlodipine, hydrochlorothiazide, and losartan.  Hyperlipidemia -Continue home pravastatin  Hyperglycemia: Random blood glucose 286.  No documented history of diabetes. -Check A1c  Tobacco use: Patient reports smoking 1 pack of cigarettes per day x 30 years. -NicoDerm patch and counseled on smoking cessation.  DVT prophylaxis: Lovenox Code Status: Full code Family Communication: No family available at this time. Disposition Plan: Status is: Inpatient  Remains inpatient appropriate because:IV treatments appropriate due to intensity of illness or inability to take PO and Inpatient level of care  appropriate due to severity of illness   Dispo: The patient is from: Home              Anticipated d/c is to: Home              Anticipated d/c date is: 2 days              Patient currently  is not medically stable to d/c.  The medical decision making on this patient was of high complexity and the patient is at high risk for clinical deterioration, therefore this is a level 3 visit.  John Giovanni MD [facility]  If 7PM-7AM, please contact night-coverage [email or web address]  12/10/2019, 11:09 PM

## 2019-12-11 DIAGNOSIS — I10 Essential (primary) hypertension: Secondary | ICD-10-CM | POA: Diagnosis not present

## 2019-12-11 DIAGNOSIS — R7303 Prediabetes: Secondary | ICD-10-CM

## 2019-12-11 DIAGNOSIS — J9601 Acute respiratory failure with hypoxia: Secondary | ICD-10-CM

## 2019-12-11 DIAGNOSIS — E785 Hyperlipidemia, unspecified: Secondary | ICD-10-CM

## 2019-12-11 DIAGNOSIS — J441 Chronic obstructive pulmonary disease with (acute) exacerbation: Secondary | ICD-10-CM | POA: Diagnosis not present

## 2019-12-11 DIAGNOSIS — E876 Hypokalemia: Secondary | ICD-10-CM

## 2019-12-11 DIAGNOSIS — J449 Chronic obstructive pulmonary disease, unspecified: Secondary | ICD-10-CM | POA: Diagnosis present

## 2019-12-11 LAB — BASIC METABOLIC PANEL
Anion gap: 10 (ref 5–15)
BUN: 17 mg/dL (ref 6–20)
CO2: 27 mmol/L (ref 22–32)
Calcium: 8.8 mg/dL — ABNORMAL LOW (ref 8.9–10.3)
Chloride: 104 mmol/L (ref 98–111)
Creatinine, Ser: 0.87 mg/dL (ref 0.44–1.00)
GFR calc Af Amer: >60 mL/min (ref >60)
GFR calc non Af Amer: >60 mL/min (ref >60)
Glucose, Bld: 226 mg/dL — ABNORMAL HIGH (ref 70–99)
Potassium: 3.3 mmol/L — ABNORMAL LOW (ref 3.5–5.1)
Sodium: 141 mmol/L (ref 135–145)

## 2019-12-11 LAB — CBC WITH DIFFERENTIAL/PLATELET
Abs Immature Granulocytes: 0.1 10*3/uL — ABNORMAL HIGH (ref 0.00–0.07)
Basophils Absolute: 0 10*3/uL (ref 0.0–0.1)
Basophils Relative: 0 %
Eosinophils Absolute: 0 10*3/uL (ref 0.0–0.5)
Eosinophils Relative: 0 %
HCT: 38.7 % (ref 36.0–46.0)
Hemoglobin: 12.5 g/dL (ref 12.0–15.0)
Immature Granulocytes: 1 %
Lymphocytes Relative: 9 %
Lymphs Abs: 0.9 10*3/uL (ref 0.7–4.0)
MCH: 30.5 pg (ref 26.0–34.0)
MCHC: 32.3 g/dL (ref 30.0–36.0)
MCV: 94.4 fL (ref 80.0–100.0)
Monocytes Absolute: 0.2 10*3/uL (ref 0.1–1.0)
Monocytes Relative: 2 %
Neutro Abs: 8.8 10*3/uL — ABNORMAL HIGH (ref 1.7–7.7)
Neutrophils Relative %: 88 %
Platelets: 260 10*3/uL (ref 150–400)
RBC: 4.1 MIL/uL (ref 3.87–5.11)
RDW: 11.8 % (ref 11.5–15.5)
WBC: 10 10*3/uL (ref 4.0–10.5)
nRBC: 0 % (ref 0.0–0.2)

## 2019-12-11 LAB — MAGNESIUM: Magnesium: 2.1 mg/dL (ref 1.7–2.4)

## 2019-12-11 LAB — HIV ANTIBODY (ROUTINE TESTING W REFLEX): HIV Screen 4th Generation wRfx: NONREACTIVE

## 2019-12-11 LAB — HEMOGLOBIN A1C
Hgb A1c MFr Bld: 6.3 % — ABNORMAL HIGH (ref 4.8–5.6)
Mean Plasma Glucose: 134.11 mg/dL

## 2019-12-11 MED ORDER — POTASSIUM CHLORIDE CRYS ER 20 MEQ PO TBCR
40.0000 meq | EXTENDED_RELEASE_TABLET | Freq: Two times a day (BID) | ORAL | Status: DC
  Administered 2019-12-11: 40 meq via ORAL
  Filled 2019-12-11: qty 2

## 2019-12-11 MED ORDER — IBUPROFEN 800 MG PO TABS
800.0000 mg | ORAL_TABLET | Freq: Three times a day (TID) | ORAL | 0 refills | Status: AC | PRN
Start: 2019-12-11 — End: 2019-12-14

## 2019-12-11 MED ORDER — PROSOURCE PLUS PO LIQD: 30.0000 mL | Freq: Two times a day (BID) | ORAL | 0 refills | Status: DC

## 2019-12-11 MED ORDER — AZITHROMYCIN 500 MG PO TABS
500.0000 mg | ORAL_TABLET | Freq: Every day | ORAL | 0 refills | Status: AC
Start: 2019-12-11 — End: 2019-12-15

## 2019-12-11 MED ORDER — PROSOURCE PLUS PO LIQD
30.0000 mL | Freq: Two times a day (BID) | ORAL | Status: DC
  Filled 2019-12-11: qty 30

## 2019-12-11 MED ORDER — GUAIFENESIN ER 600 MG PO TB12: 600.0000 mg | ORAL_TABLET | Freq: Two times a day (BID) | ORAL | 0 refills | Status: DC

## 2019-12-11 MED ORDER — METHYLPREDNISOLONE SODIUM SUCC 125 MG IJ SOLR: 60.0000 mg | Freq: Two times a day (BID) | INTRAMUSCULAR | Status: DC

## 2019-12-11 MED ORDER — BENZONATATE 100 MG PO CAPS
100.0000 mg | ORAL_CAPSULE | Freq: Three times a day (TID) | ORAL | 0 refills | Status: DC | PRN
Start: 2019-12-11 — End: 2020-01-31

## 2019-12-11 MED ORDER — IPRATROPIUM-ALBUTEROL 0.5-2.5 (3) MG/3ML IN SOLN: 3.0000 mL | Freq: Four times a day (QID) | RESPIRATORY_TRACT | 0 refills | Status: DC | PRN

## 2019-12-11 MED ORDER — HYDROCOD POLST-CPM POLST ER 10-8 MG/5ML PO SUER
5.0000 mL | Freq: Once | ORAL | Status: AC
  Administered 2019-12-11: 5 mL via ORAL
  Filled 2019-12-11: qty 5

## 2019-12-11 MED ORDER — PREDNISONE 10 MG (21) PO TBPK
ORAL_TABLET | ORAL | 0 refills | Status: DC
Start: 2019-12-11 — End: 2019-12-25

## 2019-12-11 MED ORDER — NICOTINE 21 MG/24HR TD PT24: 21.0000 mg | MEDICATED_PATCH | Freq: Every day | TRANSDERMAL | 0 refills | Status: DC

## 2019-12-11 MED ORDER — KETOROLAC TROMETHAMINE 30 MG/ML IJ SOLN
30.0000 mg | Freq: Once | INTRAMUSCULAR | Status: AC
  Administered 2019-12-11: 30 mg via INTRAVENOUS
  Filled 2019-12-11: qty 1

## 2019-12-11 NOTE — Progress Notes (Signed)
Pt discharged to home instructions reviewed with my patients, acknowledged understanding of  Instruction, equipment delivered. SRP, RN

## 2019-12-11 NOTE — Evaluation (Signed)
Occupational Therapy Evaluation Patient Details Name: Ashley Hodge MRN: [id number] DOB: [birth year] Today's Date: 12/11/2019    History of Present Illness 59 y.o. female with medical history significant of asthma, COPD, hypertension, hyperlipidemia, anxiety presenting with complaints of shortness of breath.  Patient reports 1 week history of shortness of breath, cough productive of white sputum, and wheezing.  Denies fevers, chills, or chest pain.  Reports smoking 1 pack of cigarettes daily x30 years.  States she was not aware that she had COPD and was not using any home inhalers.  States she was prescribed albuterol during her recent ED visit and also prescribed prednisone which she started taking today.   Clinical Impression   Ashley Hodge is a 59 year old woman who presents with ability to perform functional mobility and ADLs on evaluation. Patient is limited only by mild complaints of dyspnea. O2 sat monitoring performed during activity. Patient 91% on 2L. Patient ambulated to bathroom, performed toileting, and returned to edge of bed on RA and o2 sat approx 91% and HR 92. No further OT needs.    Follow Up Recommendations  No OT follow up    Equipment Recommendations  None recommended by OT    Recommendations for Other Services       Precautions / Restrictions Precautions Precautions: None Restrictions Weight Bearing Restrictions: No      Mobility Bed Mobility Overal bed mobility: Independent                Transfers Overall transfer level: Independent               General transfer comment: Patient ambulated to bathroom without device or loss of balance, performed toilet transfer, stood at the sink and returned to the bed without assistance.    Balance Overall balance assessment: No apparent balance deficits (not formally assessed)                                         ADL either performed or assessed with clinical judgement   ADL  Overall ADL's : At baseline                                       General ADL Comments: Patient demonstrates ability to perform lower body dressing and toileting and standing at sink to perform grooming tasks. Patient reports not deficits with ADLs.     Vision Baseline Vision/History: No visual deficits Vision Assessment?: No apparent visual deficits     Perception     Praxis      Pertinent Vitals/Pain Pain Assessment: No/denies pain Pain Score: 8  Pain Location: "in my lungs" Pain Descriptors / Indicators: Sore Pain Intervention(s): Limited activity within patient's tolerance;Monitored during session;Repositioned     Hand Dominance Right   Extremity/Trunk Assessment Upper Extremity Assessment Upper Extremity Assessment: Overall WFL for tasks assessed   Lower Extremity Assessment Lower Extremity Assessment: Defer to PT evaluation   Cervical / Trunk Assessment Cervical / Trunk Assessment: Normal   Communication Communication Communication: No difficulties   Cognition Arousal/Alertness: Awake/alert Behavior During Therapy: WFL for tasks assessed/performed Overall Cognitive Status: Within Functional Limits for tasks assessed  General Comments       Exercises     Shoulder Instructions      Home Living Family/patient expects to be discharged to:: Private residence Living Arrangements: Children (Son) Available Help at Discharge: Family Type of Home: Other(Comment) (stayinhg at a Motel with her son) Home Access: Level entry     Home Layout: One level     Bathroom Shower/Tub: Chief Strategy Officer: Standard     Home Equipment: Wheelchair - power;Walker - 4 wheels (rollator)   Additional Comments: Able to ambulate without a device for short distance. WC for longer distances, but is currently broken and can't get a new one until 2023. Reports "bad ligaments in her legs from  drinking."      Prior Functioning/Environment Level of Independence: Independent        Comments: Pt reports independent with ADLs. Denies current home O2. Denies recent falls.        OT Problem List:        OT Treatment/Interventions:      OT Goals(Current goals can be found in the care plan section) Acute Rehab OT Goals Patient Stated Goal: To go home today OT Goal Formulation: All assessment and education complete, DC therapy  OT Frequency:     Barriers to D/C:            Co-evaluation              AM-PAC OT "6 Clicks" Daily Activity     Outcome Measure Help from another person eating meals?: None Help from another person taking care of personal grooming?: None Help from another person toileting, which includes using toliet, bedpan, or urinal?: None Help from another person bathing (including washing, rinsing, drying)?: None Help from another person to put on and taking off regular upper body clothing?: None Help from another person to put on and taking off regular lower body clothing?: None 6 Click Score: 24   End of Session Equipment Utilized During Treatment: Oxygen Nurse Communication:  (okay to see)  Activity Tolerance: Patient tolerated treatment well Patient left: in bed;with call bell/phone within reach  OT Visit Diagnosis: Muscle weakness (generalized) (M62.81)                Time: 8182-9937 OT Time Calculation (min): 15 min Charges:  OT General Charges $OT Visit: 1 Visit OT Evaluation $OT Eval Low Complexity: 1 Low  Polo Mcmartin, OTR/L Acute Care Rehab Services  Office [phone] Pager: [phone]   Kelli Churn 12/11/2019, 12:27 PM

## 2019-12-11 NOTE — TOC Transition Note (Signed)
Transition of Care [facility]) - CM/SW Discharge Note   Patient Details  Name: Ashley Hodge MRN: [id number] Date of Birth: [birth year]  [facility] of Care [facility]) CM/SW Contact:  Darleene Cleaver, LCSW Phone Number: 12/11/2019, 4:18 PM   Clinical Narrative:     Patient is a 59 year old female who lives in a hotel.  CSW spoke to patient and she needs oxygen and a nebulizer.  CSW contacted [employer] and provided referral for DME, they will deliver patient before she leaves.  CSW asked if she needed anything else and she confirmed she does not.  CSW signing off for now please reconsult if other social work needs arise.    Final next level of care: Home/Self Care Barriers to Discharge: Barriers Resolved   Patient Goals and CMS Choice Patient states their goals for this hospitalization and ongoing recovery are:: To return back home. CMS Medicare.gov Compare Post Acute Care list provided to:: Patient Choice offered to / list presented to : Patient  Discharge Placement                       Discharge Plan and Services                DME Arranged: Oxygen, Nebulizer machine DME Agency: [employer] Date DME Agency Contacted: 12/11/19 Time DME Agency Contacted: 1300 Representative spoke with at DME Agency: Ian Malkin HH Arranged: NA          Social Determinants of Health (SDOH) Interventions     Readmission Risk Interventions No flowsheet data found.

## 2019-12-11 NOTE — Plan of Care (Signed)
  Problem: Education: Goal: Knowledge of General Education information will improve Description Including pain rating scale, medication(s)/side effects and non-pharmacologic comfort measures Outcome: Progressing   Problem: Health Behavior/Discharge Planning: Goal: Ability to manage health-related needs will improve Outcome: Progressing   

## 2019-12-11 NOTE — Plan of Care (Signed)

## 2019-12-11 NOTE — Discharge Summary (Signed)
Physician Discharge Summary  Ashley Hodge GEX:[id number] DOB: 12-24-1960 DOA: 12/10/2019  PCP: Karl Ito, DO  Admit date: 12/10/2019 Discharge date: 12/11/2019  Admitted From: Home Disposition: Home  Recommendations for Outpatient Follow-up:  1. Follow up with PCP in 1-2 weeks 2. Follow up with Pulmonary Dr. Judeth Horn on 12/25/19 at 9:00 AM for Hospital Follow up and for official PFT testing for COPD 3. Please obtain CMP/CBC, Mag, Phos in one week 4. Repeat CXR in 3-6 weeks  5. Please follow up on the following pending results:  Home Health: No Equipment/Devices: DME Neb and O2 via [place] 2 Liters  Discharge Condition: Stable CODE STATUS: FULL CODE Diet recommendation: Heart Healthy Carb Modified Diet   Brief/Interim Summary: HPI per Dr. John Giovanni on 12/10/19  Ashley Hodge is a 59 y.o. female with medical history significant of asthma, COPD, hypertension, hyperlipidemia, anxiety presenting with complaints of shortness of breath.  Patient reports 1 week history of shortness of breath, cough productive of white sputum, and wheezing.  Denies fevers, chills, or chest pain.  Reports smoking 1 pack of cigarettes daily x30 years.  States she was not aware that she had COPD and was not using any home inhalers.  States she was prescribed albuterol during her recent ED visit and also prescribed prednisone which she started taking today.  Patient has no other complaints.  ED Course: Afebrile.  Slightly tachycardic.  Wheezing on exam.  Oxygen saturation 84% with ambulation, improved with 2 L supplemental oxygen.  WBC count 17.9.  Potassium 3.4.  SARS-CoV-2 PCR test negative.  Patient was given albuterol continuous neb, Atrovent, Decadron, IV magnesium 2 g, potassium supplementation, and 500 cc normal saline bolus.  **Interim History She improved and she felt back to baseline.  She was still wheezing somewhat but she feels not as bad.  She ambulated without issues but did desaturate and so she  will require supplemental oxygen via nasal cannula.  Outpatient appointment for pulmonary has been arranged for her and she will need to follow-up with PCP and pulmonary within 1 to 2 weeks and have official outpatient PFTs and repeat chest x-ray in 3 to 6 weeks  Discharge Diagnoses:  Principal Problem:   COPD with acute exacerbation (HCC) Active Problems:   Acute respiratory failure with hypoxia (HCC)   Hypokalemia   Essential hypertension   Hyperlipidemia   Acute exacerbation of chronic obstructive pulmonary disease (COPD) (HCC)   Pre-diabetes  Acute hypoxic respiratory failure secondary to acute COPD exacerbation: -Wheezing on exam but she thinks is improved and close to her baseline.   -Oxygen saturation 84% with ambulation, improved with 2 L supplemental oxygen.  Imaging not repeated today as she had a normal CT angiogram chest on 7/20 and chest x-ray on 7/24.   -She was evaluated in the ED on 7/25 for similar symptoms and prescribed prednisone and now given IV Solu-Medrol 60 mg every 6 but will de-escalate to 69 every 12 and then discharged on a prednisone taper 60 mg.  Labs showing leukocytosis, likely reactive in setting of steroid use.   -SARS-CoV-2 PCR test done yesterday and today negative. -Started DuoNebs every 6 hours, albuterol nebulizer as needed, and Pulmicort nebulizer twice daily. -Ceftriaxone given 2/3 cardinal symptoms and severity of exacerbation and will change to p.o. azithromycin for 5 days total. -Given Tussionex and IV ketorolac once before discharge - Mucinex for cough.  Continuous pulse ox, supplemental oxygen as needed. -She desaturated again on ambulatory screen and will be discharged on supplemental oxygen  given that she likely has COPD given her emphysema noted on CT scan -She will need outpatient PFTs and we have arranged a outpatient pulmonary appointment for her on 12/25/2019 at 9 AM  Mild hypokalemia -Likely related to home diuretic use.  Potassium  3.3. -Receive potassium supplementation in the ED and will replete again.  Check magnesium level and was 2.1.  Continue to monitor electrolytes. -Repeat CMP in the outpatient setting within 1 week  Leukocytosis -In the setting of steroids -Improved and WBC went from 17.9 is now of 10.0  Hypertension: -Stable.  Currently normotensive. -Continue home amlodipine, hydrochlorothiazide, and losartan.  Hyperlipidemia -Continue home pravastatin  Hyperglycemia -Random blood glucose 286.  No documented history of diabetes. -Check A1c was 6.3 -Carb modified diet  Tobacco use: -Patient reports smoking 1 pack of cigarettes per day x 30 years. -NicoDerm patch and counseled on smoking cessation patient declined nicotine patch.  Obesity -Estimated body mass index is 31.99 kg/m as calculated from the following:   Height as of this encounter: 5' (1.524 m).   Weight as of this encounter: 74.3 kg. -Weight Loss and Dietary Counseling given   Discharge Instructions  Discharge Instructions    Call MD for:  difficulty breathing, headache or visual disturbances   Complete by: As directed    Call MD for:  extreme fatigue   Complete by: As directed    Call MD for:  hives   Complete by: As directed    Call MD for:  persistant dizziness or light-headedness   Complete by: As directed    Call MD for:  persistant nausea and vomiting   Complete by: As directed    Call MD for:  redness, tenderness, or signs of infection (pain, swelling, redness, odor or green/yellow discharge around incision site)   Complete by: As directed    Call MD for:  severe uncontrolled pain   Complete by: As directed    Call MD for:  temperature >100.4   Complete by: As directed    Diet - low sodium heart healthy   Complete by: As directed    Diet Carb Modified   Complete by: As directed    Discharge instructions   Complete by: As directed    You were cared for by a hospitalist during your hospital stay. If you have  any questions about your discharge medications or the care you received while you were in the hospital after you are discharged, you can call the unit and ask to speak with the hospitalist on call if the hospitalist that took care of you is not available. Once you are discharged, your primary care physician will handle any further medical issues. Please note that NO REFILLS for any discharge medications will be authorized once you are discharged, as it is imperative that you return to your primary care physician (or establish a relationship with a primary care physician if you do not have one) for your aftercare needs so that they can reassess your need for medications and monitor your lab values.  Follow up with PCP and Pulmonary in the outpatient setting (Pulmonary Appt is on 12/25/19 at 9:00). Take all medications as prescribed. If symptoms change or worsen please return to the ED for evaluation   Increase activity slowly   Complete by: As directed      Allergies as of 12/11/2019      Reactions   Lisinopril Other (See Comments), Cough   cough   Lisinopril  Medication List    STOP taking these medications   cyclobenzaprine 10 MG tablet Commonly known as: FLEXERIL   methocarbamol 500 MG tablet Commonly known as: ROBAXIN   naproxen 375 MG tablet Commonly known as: NAPROSYN   ondansetron 4 MG disintegrating tablet Commonly known as: Zofran ODT   ondansetron 4 MG tablet Commonly known as: Zofran   predniSONE 20 MG tablet Commonly known as: DELTASONE Replaced by: predniSONE 10 MG (21) Tbpk tablet   traMADol 50 MG tablet Commonly known as: ULTRAM     TAKE these medications   (feeding supplement) PROSource Plus liquid Take 30 mLs by mouth 2 (two) times daily between meals.   albuterol 108 (90 Base) MCG/ACT inhaler Commonly known as: VENTOLIN HFA Inhale 1-2 puffs into the lungs every 6 (six) hours as needed for shortness of breath or wheezing.   amLODipine 5 MG  tablet Commonly known as: NORVASC Take 5 mg by mouth daily.   azithromycin 500 MG tablet Commonly known as: Zithromax Take 1 tablet (500 mg total) by mouth daily for 4 days. Take 1 tablet daily for 3 days.   benzonatate 100 MG capsule Commonly known as: Tessalon Perles Take 1 capsule (100 mg total) by mouth 3 (three) times daily as needed for cough.   fluticasone 50 MCG/ACT nasal spray Commonly known as: Flonase Place 2 sprays into both nostrils daily.   guaiFENesin 600 MG 12 hr tablet Commonly known as: MUCINEX Take 1 tablet (600 mg total) by mouth 2 (two) times daily.   hydrochlorothiazide 12.5 MG tablet Commonly known as: HYDRODIURIL Take 12.5 mg by mouth daily.   ibuprofen 800 MG tablet Commonly known as: ADVIL Take 1 tablet (800 mg total) by mouth every 8 (eight) hours as needed for up to 3 days for moderate pain.   ipratropium-albuterol 0.5-2.5 (3) MG/3ML Soln Commonly known as: DUONEB Take 3 mLs by nebulization every 6 (six) hours as needed (Wheezing and SOB).   losartan 50 MG tablet Commonly known as: COZAAR Take 50 mg by mouth daily. What changed: Another medication with the same name was removed. Continue taking this medication, and follow the directions you see here.   nicotine 21 mg/24hr patch Commonly known as: NICODERM CQ - dosed in mg/24 hours Place 1 patch (21 mg total) onto the skin daily. Start taking on: December 12, 2019   pravastatin 80 MG tablet Commonly known as: PRAVACHOL Take 80 mg by mouth daily.   predniSONE 10 MG (21) Tbpk tablet Commonly known as: STERAPRED UNI-PAK 21 TAB Take 6 tablets on day 1, 5 tablets on day 2, 4 tablets on day 3, 3 tablets on day 4, 2 tablets on day 5, 1 tablet on the sixth and stop on Day 7 Replaces: predniSONE 20 MG tablet            Durable Medical Equipment  (From admission, onward)         Start     Ordered   12/11/19 1244  For home use only DME Nebulizer/meds  Once       Question Answer Comment   Patient needs a nebulizer to treat with the following condition COPD (chronic obstructive pulmonary disease) (HCC)   Length of Need 12 Months      12/11/19 1244   12/11/19 1236  For home use only DME oxygen  Once       Question Answer Comment  Length of Need 12 Months   Mode or (Route) Nasal cannula   Liters per Minute  2   Frequency Continuous (stationary and portable oxygen unit needed)   Oxygen delivery system Gas      12/11/19 1235          Follow-up Information    Karl Ito, DO. Call.   Specialty: General Practice Why: Call to folllow up within 1 week  Contact information: [address] [place] [address] [phone]        [facility]. Go to.   Specialty: Pulmonology Why: Appointment scheduled for 12/25/19 at 9:00 AM with Dr. Chanetta Marshall information: [address] [place] [address] [phone]             Allergies  Allergen Reactions  . Lisinopril Other (See Comments) and Cough    cough  . Lisinopril    Consultations:  None  Procedures/Studies: DG Chest 2 View  Result Date: 12/07/2019 CLINICAL DATA:  59 year old female with history of shortness of breath since last night. EXAM: CHEST - 2 VIEW COMPARISON:  Chest x-ray 12/03/2019. FINDINGS: Lung volumes are normal. No consolidative airspace disease. No pleural effusions. No pneumothorax. No pulmonary nodule or mass noted. Pulmonary vasculature and the cardiomediastinal silhouette are within normal limits. Atherosclerosis in the thoracic aorta. IMPRESSION: 1.  No radiographic evidence of acute cardiopulmonary disease. 2. Aortic atherosclerosis. Electronically Signed   By: Trudie Reed M.D.   On: 12/07/2019 09:55   DG Chest 2 View  Result Date: 12/03/2019 CLINICAL DATA:  Bilateral shoulder pain for 2 weeks. EXAM: CHEST - 2 VIEW COMPARISON:  November 19, 2019 FINDINGS: There is no evidence of acute infiltrate, pleural effusion or pneumothorax. A very  faint, ill-defined 0.9 cm area of increased opacification is seen overlying the lateral aspect of the upper left lung. This represents a new finding when compared to the prior study. The heart size and mediastinal contours are within normal limits. The visualized skeletal structures are unremarkable. IMPRESSION: Very faint, ill-defined area of increased opacification overlying the lateral aspect of the upper left lung. A small area of atelectasis cannot be excluded. Follow-up to resolution is recommended. Electronically Signed   By: Aram Candela M.D.   On: 12/03/2019 02:14   DG Chest 2 View  Result Date: 11/19/2019 CLINICAL DATA:  Cough for 1 week with chest pain EXAM: CHEST - 2 VIEW COMPARISON:  10/12/2019 FINDINGS: The heart size and mediastinal contours are within normal limits. Both lungs are clear. The visualized skeletal structures are unremarkable. IMPRESSION: No active cardiopulmonary disease. Electronically Signed   By: Alcide Clever M.D.   On: 11/19/2019 02:06   CT Angio Chest PE W and/or Wo Contrast  Result Date: 12/03/2019 CLINICAL DATA:  Chest and shoulder pain. Positive D-dimer. EXAM: CT ANGIOGRAPHY CHEST WITH CONTRAST TECHNIQUE: Multidetector CT imaging of the chest was performed using the standard protocol during bolus administration of intravenous contrast. Multiplanar CT image reconstructions and MIPs were obtained to evaluate the vascular anatomy. CONTRAST:  OMNIPAQUE IOHEXOL 350 MG/ML SOLN COMPARISON:  None. FINDINGS: Cardiovascular: The heart is normal in size. No pericardial effusion. The aorta is normal in caliber. No dissection. Scattered atherosclerotic calcifications. The branch vessels are patent. No obvious coronary artery calcifications. The pulmonary arterial tree is fairly well opacified. No definite filling defects to suggest pulmonary embolism. Mediastinum/Nodes: Small scattered mediastinal and hilar lymph nodes. No mass or overt adenopathy. The esophagus is grossly  normal. Lungs/Pleura: Changes of centrilobar emphysema. No infiltrates or effusions. No worrisome pulmonary lesions. No interstitial lung disease or bronchiectasis. Mild bronchial  wall thickening and minimal surrounding inflammation in the left upper lobe suggesting focal bronchitis. Upper Abdomen: No significant upper abdominal findings. Musculoskeletal: No breast masses, supraclavicular or axillary adenopathy. The thyroid gland appears normal. The bony structures are unremarkable. Review of the MIP images confirms the above findings. IMPRESSION: 1. No CT findings for pulmonary embolism. 2. Normal caliber thoracic aorta and no dissection. 3. Changes of centrilobar emphysema. 4. Mild left upper lobe bronchitis.  No infiltrates. 5. Emphysema and aortic atherosclerosis. Aortic Atherosclerosis (ICD10-I70.0) and Emphysema (ICD10-J43.9). Aortic Atherosclerosis (ICD10-I70.0) and Emphysema (ICD10-J43.9). Electronically Signed   By: Rudie Meyer M.D.   On: 12/03/2019 05:32     Subjective: Seen and examined at bedside and she is feeling better and back to baseline.  No nausea or vomiting.  Denies any lightheadedness or dizziness.  No other concerns or complaints at this time but still continues have a cough with some pain associated with her cough.  No other concerns or complaints at this point  Discharge Exam: Vitals:   12/11/19 1336 12/11/19 1525  BP: 119/69   Pulse: 84   Resp: 16   Temp: 98.5 F (36.9 C)   SpO2: 95% 91%   Vitals:   12/11/19 0756 12/11/19 0915 12/11/19 1336 12/11/19 1525  BP:   119/69   Pulse:   84   Resp:   16   Temp:   98.5 F (36.9 C)   TempSrc:   Oral   SpO2: 97% 91% 95% 91%  Weight:      Height:       General: Pt is alert, awake, not in acute distress Cardiovascular: RRR, S1/S2 +, no rubs, no gallops Respiratory: Diminished bilaterally with some expiratory wheezing, no rhonchi; wearing supplemental oxygen via nasal cannula Abdominal: Soft, NT, distended secondary body  habitus, bowel sounds + Extremities: no edema, no cyanosis  The results of significant diagnostics from this hospitalization (including imaging, microbiology, ancillary and laboratory) are listed below for reference.    Microbiology: Recent Results (from the past 240 hour(s))  SARS Coronavirus 2 by RT PCR (hospital order, performed in [facility] hospital lab) Nasopharyngeal Nasopharyngeal Swab     Status: None   Collection Time: 12/09/19 12:04 AM   Specimen: Nasopharyngeal Swab  Result Value Ref Range Status   SARS Coronavirus 2 NEGATIVE NEGATIVE Final    Comment: (NOTE) SARS-CoV-2 target nucleic acids are NOT DETECTED.  The SARS-CoV-2 RNA is generally detectable in upper and lower respiratory specimens during the acute phase of infection. The lowest concentration of SARS-CoV-2 viral copies this assay can detect is 250 copies / mL. A negative result does not preclude SARS-CoV-2 infection and should not be used as the sole basis for treatment or other patient management decisions.  A negative result may occur with improper specimen collection / handling, submission of specimen other than nasopharyngeal swab, presence of viral mutation(s) within the areas targeted by this assay, and inadequate number of viral copies (<250 copies / mL). A negative result must be combined with clinical observations, patient history, and epidemiological information.  Fact Sheet for Patients:   BoilerBrush.com.cy  Fact Sheet for Healthcare Providers: https://pope.com/  This test is not yet approved or  cleared by the [place] FDA and has been authorized for detection and/or diagnosis of SARS-CoV-2 by FDA under an Emergency Use Authorization (EUA).  This EUA will remain in effect (meaning this test can be used) for the duration of the COVID-19 declaration under Section 564(b)(1) of the Act, 21 U.S.C. section  360bbb-3(b)(1), unless the authorization  is terminated or revoked sooner.  Performed at [facility], [address], [place], [place] [address]   SARS Coronavirus 2 by RT PCR (hospital order, performed in [facility] hospital lab) Nasopharyngeal Nasopharyngeal Swab     Status: None   Collection Time: 12/10/19  8:54 PM   Specimen: Nasopharyngeal Swab  Result Value Ref Range Status   SARS Coronavirus 2 NEGATIVE NEGATIVE Final    Comment: (NOTE) SARS-CoV-2 target nucleic acids are NOT DETECTED.  The SARS-CoV-2 RNA is generally detectable in upper and lower respiratory specimens during the acute phase of infection. The lowest concentration of SARS-CoV-2 viral copies this assay can detect is 250 copies / mL. A negative result does not preclude SARS-CoV-2 infection and should not be used as the sole basis for treatment or other patient management decisions.  A negative result may occur with improper specimen collection / handling, submission of specimen other than nasopharyngeal swab, presence of viral mutation(s) within the areas targeted by this assay, and inadequate number of viral copies (<250 copies / mL). A negative result must be combined with clinical observations, patient history, and epidemiological information.  Fact Sheet for Patients:   BoilerBrush.com.cy  Fact Sheet for Healthcare Providers: https://pope.com/  This test is not yet approved or  cleared by the [place] FDA and has been authorized for detection and/or diagnosis of SARS-CoV-2 by FDA under an Emergency Use Authorization (EUA).  This EUA will remain in effect (meaning this test can be used) for the duration of the COVID-19 declaration under Section 564(b)(1) of the Act, 21 U.S.C. section 360bbb-3(b)(1), unless the authorization is terminated or revoked sooner.  Performed at [facility], [address], [place], [place] [address]      Labs: BNP (last 3  results) No results for input(s): BNP in the last 8760 hours. Basic Metabolic Panel: Recent Labs  Lab 12/07/19 0918 12/10/19 2030 12/11/19 0030 12/11/19 0619  NA 137 139  --  141  K 3.5 3.4*  --  3.3*  CL 99 98  --  104  CO2 27 23  --  27  GLUCOSE 114* 286*  --  226*  BUN 7 22*  --  17  CREATININE 0.83 0.97  --  0.87  CALCIUM 8.5* 8.8*  --  8.8*  MG  --   --  2.1  --    Liver Function Tests: No results for input(s): AST, ALT, ALKPHOS, BILITOT, PROT, ALBUMIN in the last 168 hours. No results for input(s): LIPASE, AMYLASE in the last 168 hours. No results for input(s): AMMONIA in the last 168 hours. CBC: Recent Labs  Lab 12/07/19 0918 12/10/19 2030 12/11/19 0829  WBC 10.1 17.9* 10.0  NEUTROABS  --  16.5* 8.8*  HGB 13.7 14.0 12.5  HCT 42.5 42.8 38.7  MCV 93.8 95.1 94.4  PLT 205 298 260   Cardiac Enzymes: No results for input(s): CKTOTAL, CKMB, CKMBINDEX, TROPONINI in the last 168 hours. BNP: Invalid input(s): POCBNP CBG: No results for input(s): GLUCAP in the last 168 hours. D-Dimer No results for input(s): DDIMER in the last 72 hours. Hgb A1c Recent Labs    12/11/19 0619  HGBA1C 6.3*   Lipid Profile No results for input(s): CHOL, HDL, LDLCALC, TRIG, CHOLHDL, LDLDIRECT in the last 72 hours. Thyroid function studies No results for input(s): TSH, T4TOTAL, T3FREE, THYROIDAB in the last 72 hours.  Invalid input(s): FREET3 Anemia work up No results for input(s): VITAMINB12, FOLATE,  FERRITIN, TIBC, IRON, RETICCTPCT in the last 72 hours. Urinalysis    Component Value Date/Time   COLORURINE YELLOW 02/24/2018 1154   APPEARANCEUR HAZY (A) 02/24/2018 1154   LABSPEC 1.012 02/24/2018 1154   PHURINE 6.0 02/24/2018 1154   GLUCOSEU NEGATIVE 02/24/2018 1154   HGBUR NEGATIVE 02/24/2018 1154   BILIRUBINUR NEGATIVE 02/24/2018 1154   KETONESUR NEGATIVE 02/24/2018 1154   PROTEINUR NEGATIVE 02/24/2018 1154   NITRITE NEGATIVE 02/24/2018 1154   LEUKOCYTESUR LARGE (A)  02/24/2018 1154   Sepsis Labs Invalid input(s): PROCALCITONIN,  WBC,  LACTICIDVEN Microbiology Recent Results (from the past 240 hour(s))  SARS Coronavirus 2 by RT PCR (hospital order, performed in [facility] hospital lab) Nasopharyngeal Nasopharyngeal Swab     Status: None   Collection Time: 12/09/19 12:04 AM   Specimen: Nasopharyngeal Swab  Result Value Ref Range Status   SARS Coronavirus 2 NEGATIVE NEGATIVE Final    Comment: (NOTE) SARS-CoV-2 target nucleic acids are NOT DETECTED.  The SARS-CoV-2 RNA is generally detectable in upper and lower respiratory specimens during the acute phase of infection. The lowest concentration of SARS-CoV-2 viral copies this assay can detect is 250 copies / mL. A negative result does not preclude SARS-CoV-2 infection and should not be used as the sole basis for treatment or other patient management decisions.  A negative result may occur with improper specimen collection / handling, submission of specimen other than nasopharyngeal swab, presence of viral mutation(s) within the areas targeted by this assay, and inadequate number of viral copies (<250 copies / mL). A negative result must be combined with clinical observations, patient history, and epidemiological information.  Fact Sheet for Patients:   BoilerBrush.com.cy  Fact Sheet for Healthcare Providers: https://pope.com/  This test is not yet approved or  cleared by the [place] FDA and has been authorized for detection and/or diagnosis of SARS-CoV-2 by FDA under an Emergency Use Authorization (EUA).  This EUA will remain in effect (meaning this test can be used) for the duration of the COVID-19 declaration under Section 564(b)(1) of the Act, 21 U.S.C. section 360bbb-3(b)(1), unless the authorization is terminated or revoked sooner.  Performed at [facility], [address] [place], [place] [address]   SARS  Coronavirus 2 by RT PCR (hospital order, performed in [facility] hospital lab) Nasopharyngeal Nasopharyngeal Swab     Status: None   Collection Time: 12/10/19  8:54 PM   Specimen: Nasopharyngeal Swab  Result Value Ref Range Status   SARS Coronavirus 2 NEGATIVE NEGATIVE Final    Comment: (NOTE) SARS-CoV-2 target nucleic acids are NOT DETECTED.  The SARS-CoV-2 RNA is generally detectable in upper and lower respiratory specimens during the acute phase of infection. The lowest concentration of SARS-CoV-2 viral copies this assay can detect is 250 copies / mL. A negative result does not preclude SARS-CoV-2 infection and should not be used as the sole basis for treatment or other patient management decisions.  A negative result may occur with improper specimen collection / handling, submission of specimen other than nasopharyngeal swab, presence of viral mutation(s) within the areas targeted by this assay, and inadequate number of viral copies (<250 copies / mL). A negative result must be combined with clinical observations, patient history, and epidemiological information.  Fact Sheet for Patients:   [employer]cy  Fact Sheet for Healthcare Providers: https://pope.com/  This test is not yet approved or  cleared by the [place] FDA and has been authorized for detection and/or diagnosis of SARS-CoV-2 by FDA under  an Emergency Use Authorization (EUA).  This EUA will remain in effect (meaning this test can be used) for the duration of the COVID-19 declaration under Section 564(b)(1) of the Act, 21 U.S.C. section 360bbb-3(b)(1), unless the authorization is terminated or revoked sooner.  Performed at [facility], [address] [place], [place] [address]    Time coordinating discharge: 35 minutes  SIGNED:  Merlene Laughter, DO [facility] 12/11/2019, 7:34 PM Pager is on AMION  If 7PM-7AM, please  contact night-coverage [email or web address]

## 2019-12-11 NOTE — ED Notes (Addendum)
ED TO INPATIENT HANDOFF REPORT  ED Nurse Name and Phone #: Sharene Skeans [phone]  S Name/Age/Gender Ashley Hodge 59 y.o. female Room/Bed: WA24/WA24  Code Status   Code Status: Full Code  Home/SNF/Other Home Patient oriented to: self, place, time and situation Is this baseline? Yes   Triage Complete: Triage complete  Chief Complaint COPD with acute exacerbation [facility]) [J44.1]  Triage Note Pt reports she was recently told she has COPD and today stopped smoking. Patient denies pain, dizziness, or any other concern. Patient reports she just needs an order to have oxygen at home.     Allergies Allergies  Allergen Reactions   Lisinopril Other (See Comments) and Cough    cough   Lisinopril     Level of Care/Admitting Diagnosis ED Disposition    ED Disposition Condition Comment   Admit  Hospital Area: [facility] [[id number]] Level of Care: Telemetry [5] Admit to tele based on following criteria: Complex arrhythmia (Bradycardia/Tachycardia) May admit patient to [facility] or [facility] if equivalent level of care is a vailable:: Yes Covid Evaluation: COVID negative Diagnosis: COPD with acute exacerbation [facility]) [[id number]] Admitting Physician: John Giovanni [[id number]] Attending Physician: John Giovanni [[id number]] Estimated length of stay: past midnight tomorr ow Certification:: I certify this patient will need inpatient services for at least 2 midnights       B Medical/Surgery History Past Medical History:  Diagnosis Date   Anxiety    Asthma    COPD (chronic obstructive pulmonary disease) (HCC)    High cholesterol    Hypertension    Past Surgical History:  Procedure Laterality Date   TUBAL LIGATION       A IV Location/Drains/Wounds Patient Lines/Drains/Airways Status    Active Line/Drains/Airways    Name Placement date Placement time Site Days   Peripheral IV 12/10/19 Right Antecubital 12/10/19  2057  Antecubital  1           Intake/Output Last 24 hours No intake or output data in the 24 hours ending 12/11/19 0500  Labs/Imaging Results for orders placed or performed during the hospital encounter of 12/10/19 (from the past 48 hour(s))  CBC with Differential     Status: Abnormal   Collection Time: 12/10/19  8:30 PM  Result Value Ref Range   WBC 17.9 (H) 4.0 - 10.5 K/uL   RBC 4.50 3.87 - 5.11 MIL/uL   Hemoglobin 14.0 12.0 - 15.0 g/dL   HCT 17.6 36 - 46 %   MCV 95.1 80.0 - 100.0 fL   MCH 31.1 26.0 - 34.0 pg   MCHC 32.7 30.0 - 36.0 g/dL   RDW 16.0 73.7 - 10.6 %   Platelets 298 150 - 400 K/uL   nRBC 0.0 0.0 - 0.2 %   Neutrophils Relative % 93 %   Neutro Abs 16.5 (H) 1.7 - 7.7 K/uL   Lymphocytes Relative 5 %   Lymphs Abs 1.0 0.7 - 4.0 K/uL   Monocytes Relative 2 %   Monocytes Absolute 0.4 0 - 1 K/uL   Eosinophils Relative 0 %   Eosinophils Absolute 0.0 0 - 0 K/uL   Basophils Relative 0 %   Basophils Absolute 0.0 0 - 0 K/uL   Immature Granulocytes 0 %   Abs Immature Granulocytes 0.08 (H) 0.00 - 0.07 K/uL    Comment: Performed at [facility], [address], [place], [place] [address]  Basic metabolic panel     Status: Abnormal   Collection Time: 12/10/19  8:30  PM  Result Value Ref Range   Sodium 139 135 - 145 mmol/L   Potassium 3.4 (L) 3.5 - 5.1 mmol/L   Chloride 98 98 - 111 mmol/L   CO2 23 22 - 32 mmol/L   Glucose, Bld 286 (H) 70 - 99 mg/dL    Comment: Glucose reference range applies only to samples taken after fasting for at least 8 hours.   BUN 22 (H) 6 - 20 mg/dL   Creatinine, Ser 2.26 0.44 - 1.00 mg/dL   Calcium 8.8 (L) 8.9 - 10.3 mg/dL   GFR calc non Af Amer >60 >60 mL/min   GFR calc Af Amer >60 >60 mL/min   Anion gap 18 (H) 5 - 15    Comment: Performed at [facility], [address], [place], [place] [address]  SARS Coronavirus 2 by RT PCR (hospital order, performed in [facility] hospital lab) Nasopharyngeal Nasopharyngeal Swab     Status: None    Collection Time: 12/10/19  8:54 PM   Specimen: Nasopharyngeal Swab  Result Value Ref Range   SARS Coronavirus 2 NEGATIVE NEGATIVE    Comment: (NOTE) SARS-CoV-2 target nucleic acids are NOT DETECTED.  The SARS-CoV-2 RNA is generally detectable in upper and lower respiratory specimens during the acute phase of infection. The lowest concentration of SARS-CoV-2 viral copies this assay can detect is 250 copies / mL. A negative result does not preclude SARS-CoV-2 infection and should not be used as the sole basis for treatment or other patient management decisions.  A negative result may occur with improper specimen collection / handling, submission of specimen other than nasopharyngeal swab, presence of viral mutation(s) within the areas targeted by this assay, and inadequate number of viral copies (<250 copies / mL). A negative result must be combined with clinical observations, patient history, and epidemiological information.  Fact Sheet for Patients:   Boiler[employer]com[employer]cy  Fact Sheet for Healthcare Providers: https://pope.com/  This test is not yet approved or  cleared by the [place] FDA and has been authorized for detection and/or diagnosis of SARS-CoV-2 by FDA under an Emergency Use Authorization (EUA).  This EUA will remain in effect (meaning this test can be used) for the duration of the COVID-19 declaration under Section 564(b)(1) of the Act, 21 U.S.C. section 360bbb-3(b)(1), unless the authorization is terminated or revoked sooner.  Performed at [facility], [address] [place], [place] [address]   Magnesium     Status: None   Collection Time: 12/11/19 12:30 AM  Result Value Ref Range   Magnesium 2.1 1.7 - 2.4 mg/dL    Comment: Performed at [facility], [address], [place], [place] [address]   No results found.  Pending Labs [employer] (From admission, onward)  Comment          Start     Ordered   12/11/19 0500  Basic metabolic panel  Tomorrow morning,   R        12/10/19 2305   12/11/19 0500  Hemoglobin A1c  Tomorrow morning,   R        12/10/19 2306   12/10/19 2300  HIV Antibody (routine testing w rflx)  (HIV Antibody (Routine testing w reflex) panel)  Once,   STAT        12/10/19 2305          Vitals/Pain Today's Vitals   12/10/19 2217 12/10/19 2241 12/11/19 0030 12/11/19 0246  BP:  (!) 132/80 124/80 (!) 141/70  Pulse:  102 101 97  Resp:  16 19 18   Temp:      TempSrc:      SpO2: 92% 96% 92% 93%  Weight:      Height:      PainSc:        Isolation Precautions No active isolations  Medications Medications  amLODipine (NORVASC) tablet 5 mg (has no administration in time range)  hydrochlorothiazide (MICROZIDE) capsule 12.5 mg (has no administration in time range)  losartan (COZAAR) tablet 50 mg (has no administration in time range)  pravastatin (PRAVACHOL) tablet 80 mg (has no administration in time range)  enoxaparin (LOVENOX) injection 40 mg (40 mg Subcutaneous Refused 12/11/19 0042)  cefTRIAXone (ROCEPHIN) 1 g in sodium chloride 0.9 % 100 mL IVPB (0 g Intravenous Stopped 12/11/19 0350)  methylPREDNISolone sodium succinate (SOLU-MEDROL) 125 mg/2 mL injection 60 mg (60 mg Intravenous Given 12/11/19 0045)  budesonide (PULMICORT) nebulizer solution 0.25 mg (0.25 mg Nebulization Refused 12/11/19 0042)  ipratropium-albuterol (DUONEB) 0.5-2.5 (3) MG/3ML nebulizer solution 3 mL (3 mLs Nebulization Refused 12/11/19 0106)  albuterol (PROVENTIL) (2.5 MG/3ML) 0.083% nebulizer solution 2.5 mg (2.5 mg Nebulization Given 12/11/19 0353)  guaiFENesin (MUCINEX) 12 hr tablet 600 mg (600 mg Oral Given 12/11/19 0047)  nicotine (NICODERM CQ - dosed in mg/24 hours) patch 21 mg (21 mg Transdermal Not Given 12/11/19 0042)  albuterol (PROVENTIL,VENTOLIN) solution continuous neb (10 mg/hr Nebulization Given 12/10/19 1705)  dexamethasone (DECADRON) injection 10  mg (10 mg Intramuscular Given 12/10/19 2038)  ipratropium (ATROVENT) nebulizer solution 1 mg (1 mg Nebulization Given 12/10/19 1705)  magnesium sulfate IVPB 2 g 50 mL (0 g Intravenous Stopped 12/10/19 2210)  albuterol (PROVENTIL) (2.5 MG/3ML) 0.083% nebulizer solution 5 mg (5 mg Nebulization Given 12/10/19 2217)  sodium chloride 0.9 % bolus 500 mL (0 mLs Intravenous Stopped 12/11/19 0037)  potassium chloride SA (KLOR-CON) CR tablet 40 mEq (40 mEq Oral Given 12/10/19 2216)    Mobility walks Low fall risk   Focused Assessments Pulmonary Assessment Handoff:  Lung sounds: Bilateral Breath Sounds: Diminished  Nasal Canula O2 Flow Rate (L/min): 2 L/min      R Recommendations: See Admitting Provider Note  Report given to: [place] RN  Additional Notes:

## 2019-12-11 NOTE — Evaluation (Signed)
Physical Therapy Evaluation Patient Details Name: Ashley Hodge MRN: [id number] DOB: 04-16-1961 Today's Date: 12/11/2019   History of Present Illness  59 y.o. female with medical history significant of asthma, COPD, hypertension, hyperlipidemia, anxiety presenting with complaints of shortness of breath.  Patient reports 1 week history of shortness of breath, cough productive of white sputum, and wheezing.  Denies fevers, chills, or chest pain.  Reports smoking 1 pack of cigarettes daily x30 years.  States she was not aware that she had COPD and was not using any home inhalers.  States she was prescribed albuterol during her recent ED visit and also prescribed prednisone which she started taking today.  Clinical Impression  Physical therapy evaluation completed, patient is at baseline and no further PT services recommended at this time. Pt safely mobilizing around room, on RA with SpO2 88-93%, cued for pursed lip breathing with ambulation due to mild SOB and desat. Patient discharged to care of nursing for ambulation daily as tolerated for length of stay.     Follow Up Recommendations No PT follow up    Equipment Recommendations  None recommended by PT    Recommendations for Other Services       Precautions / Restrictions Precautions Precautions: None Restrictions Weight Bearing Restrictions: No      Mobility  Bed Mobility Overal bed mobility: Independent   Transfers Overall transfer level: Independent Equipment used: None  General transfer comment: rise from seated surface without UE assisting, no loss of balance  Ambulation/Gait Ambulation/Gait assistance: Independent Gait Distance (Feet): 40 Feet Assistive device: None Gait Pattern/deviations: WFL(Within Functional Limits)     General Gait Details: amb around room without AD, gait WFL, no loss of balance, able to complete direction changes and head turns without loss of balance  Stairs            Wheelchair  Mobility    Modified Rankin (Stroke Patients Only)       Balance Overall balance assessment: Independent          Pertinent Vitals/Pain Pain Assessment: No/denies pain Pain Score: 8  Pain Location: "in my lungs" Pain Descriptors / Indicators: Sore Pain Intervention(s): Limited activity within patient's tolerance;Monitored during session;Repositioned    Home Living Family/patient expects to be discharged to:: Private residence Living Arrangements: Children (Son) Available Help at Discharge: Family Type of Home: Other(Comment) (stayinhg at a Motel with her son) Home Access: Level entry     Home Layout: One level Home Equipment: Wheelchair - power;Walker - 4 wheels (rollator) Additional Comments: Able to ambulate without a device for short distance. WC for longer distances, but is currently broken and can't get a new one until 2023. Reports "bad ligaments in her legs from drinking."    Prior Function Level of Independence: Independent         Comments: Pt reports independent with ADLs. Denies current home O2. Denies recent falls.     Hand Dominance   Dominant Hand: Right    Extremity/Trunk Assessment   Upper Extremity Assessment Upper Extremity Assessment: Defer to OT evaluation    Lower Extremity Assessment Lower Extremity Assessment: Overall WFL for tasks assessed (AROM WNL, strength 4+/5, denies numbness/tingling throughout BLE)    Cervical / Trunk Assessment Cervical / Trunk Assessment: Normal  Communication   Communication: No difficulties  Cognition Arousal/Alertness: Awake/alert Behavior During Therapy: WFL for tasks assessed/performed Overall Cognitive Status: Within Functional Limits for tasks assessed       General Comments General comments (skin integrity, edema, etc.): on RA  with SpO2 88-93% while ambulating, mild SOB and cued for pursed lip breathing    Exercises     Assessment/Plan    PT Assessment Patent does not need any further PT  services  PT Problem List         PT Treatment Interventions      PT Goals (Current goals can be found in the Care Plan section)  Acute Rehab PT Goals Patient Stated Goal: To go home today PT Goal Formulation: With patient Time For Goal Achievement: 12/18/19 Potential to Achieve Goals: Good    Frequency     Barriers to discharge        Co-evaluation               AM-PAC PT "6 Clicks" Mobility  Outcome Measure Help needed turning from your back to your side while in a flat bed without using bedrails?: None Help needed moving from lying on your back to sitting on the side of a flat bed without using bedrails?: None Help needed moving to and from a bed to a chair (including a wheelchair)?: None Help needed standing up from a chair using your arms (e.g., wheelchair or bedside chair)?: None Help needed to walk in hospital room?: None Help needed climbing 3-5 steps with a railing? : None 6 Click Score: 24    End of Session   Activity Tolerance: Patient tolerated treatment well Patient left: in bed;with call bell/phone within reach Nurse Communication: Mobility status;Other (comment) (O2) PT Visit Diagnosis: Other abnormalities of gait and mobility (R26.89)    Time: 7591-6384 PT Time Calculation (min) (ACUTE ONLY): 14 min   Charges:   PT Evaluation $PT Eval Low Complexity: 1 Low           Tori Emanie Behan PT, DPT 12/11/19, 12:55 PM

## 2019-12-11 NOTE — Care Management CC44 (Signed)
Condition Code 44 Documentation Completed  Patient Details  Name: Timber Marshman MRN: [id number] Date of Birth: 06/03/1960   Condition Code 44 given:  Yes Patient signature on Condition Code 44 notice:  Yes Documentation of 2 MD's agreement:  Yes Code 44 added to claim:  Yes    Darleene Cleaver, LCSW 12/11/2019, 1:54 PM

## 2019-12-11 NOTE — Progress Notes (Signed)
SATURATION QUALIFICATIONS: (This note is used to comply with regulatory documentation for home oxygen)  Patient Saturations on Room Air at Rest 91%  Patient Saturations on Room Air while Ambulating 86%  Patient Saturations on 2 Liters of oxygen while Ambulating 95%  Please briefly explain why patient needs home oxygen:  Pt wheezing and dyspnea on exertion with rest periods.Marland Kitchen

## 2019-12-11 NOTE — Progress Notes (Signed)
Initial Nutrition Assessment  DOCUMENTATION CODES:   Obesity unspecified  INTERVENTION:  - will order 30 ml Prosource Plus BID, each supplement provides 100 kcal and 15 grams protein.  NUTRITION DIAGNOSIS:   Increased nutrient needs related to acute illness as evidenced by estimated needs.  GOAL:   Patient will meet greater than or equal to 90% of their needs  MONITOR:   PO intake, Supplement acceptance, Labs, Weight trends  REASON FOR ASSESSMENT:   Consult Assessment of nutrition requirement/status  ASSESSMENT:   59 y.o. female with medical history of asthma, COPD, HTN, hyperlipidemia, and anxiety. She presented to the ED with SOB x1 week with cough productive of white sputum, and wheezing. She reported smoking 1 pack of cigarettes/day x30 years.  No intakes documented since admission. Patient denies any changes in appetite, no difficulties with chewing or swallowing with above listed symptoms. She denies noticing any weight changes. Per chart review, weight today is 164 lb and weight for the past 16 months has been 160-163 lb.   Per notes: - COPD exacerbation - mild hypokalemia thought to be 2/2 home diuretic use - plan to check HgbA1c d/t hyperglycemia   Labs reviewed; K: 3.3 mmol/l, Ca: 8.8 mg/dl. Medications reviewed; 2 g IV Mg sulfate x1 run 7/27, 60 mg solu-medrol BID, 40 mEq Klor-Con BID.     NUTRITION - FOCUSED PHYSICAL EXAM:  completed; no muscle or fat wasting, no edema noted at this time.   Diet Order:   Diet Order            Diet heart healthy/carb modified Room service appropriate? Yes; Fluid consistency: Thin  Diet effective now                 EDUCATION NEEDS:   No education needs have been identified at this time  Skin:  Skin Assessment: Reviewed RN Assessment  Last BM:  7/27  Height:   Ht Readings from Last 1 Encounters:  12/11/19 5' (1.524 m)    Weight:   Wt Readings from Last 1 Encounters:  12/11/19 74.3 kg     Estimated  Nutritional Needs:  Kcal:  1650-1850 kcal Protein:  80-95 grams Fluid:  >/= 1.8 L/day     Trenton Gammon, MS, RD, LDN, CNSC Inpatient Clinical Dietitian RD pager # available in AMION  After hours/weekend pager # available in [facility]

## 2019-12-11 NOTE — Care Management Obs Status (Signed)
MEDICARE OBSERVATION STATUS NOTIFICATION   Patient Details  Name: Ashley Hodge MRN: [id number] Date of Birth: 11-16-1960   Medicare Observation Status Notification Given:  Yes    Darleene Cleaver, LCSW 12/11/2019, 1:53 PM

## 2019-12-23 ENCOUNTER — Other Ambulatory Visit: Payer: Self-pay

## 2019-12-23 ENCOUNTER — Emergency Department (HOSPITAL_COMMUNITY): Payer: Medicare Other

## 2019-12-23 DIAGNOSIS — M542 Cervicalgia: Secondary | ICD-10-CM | POA: Diagnosis not present

## 2019-12-23 DIAGNOSIS — Z79899 Other long term (current) drug therapy: Secondary | ICD-10-CM | POA: Insufficient documentation

## 2019-12-23 DIAGNOSIS — M25512 Pain in left shoulder: Secondary | ICD-10-CM | POA: Diagnosis not present

## 2019-12-23 DIAGNOSIS — J45909 Unspecified asthma, uncomplicated: Secondary | ICD-10-CM | POA: Insufficient documentation

## 2019-12-23 DIAGNOSIS — I10 Essential (primary) hypertension: Secondary | ICD-10-CM | POA: Diagnosis not present

## 2019-12-23 DIAGNOSIS — Z7951 Long term (current) use of inhaled steroids: Secondary | ICD-10-CM | POA: Insufficient documentation

## 2019-12-23 DIAGNOSIS — R0789 Other chest pain: Secondary | ICD-10-CM | POA: Diagnosis not present

## 2019-12-23 DIAGNOSIS — F17210 Nicotine dependence, cigarettes, uncomplicated: Secondary | ICD-10-CM | POA: Insufficient documentation

## 2019-12-23 DIAGNOSIS — J441 Chronic obstructive pulmonary disease with (acute) exacerbation: Secondary | ICD-10-CM | POA: Insufficient documentation

## 2019-12-23 LAB — CBC
HCT: 43.9 % (ref 36.0–46.0)
Hemoglobin: 14.3 g/dL (ref 12.0–15.0)
MCH: 31 pg (ref 26.0–34.0)
MCHC: 32.6 g/dL (ref 30.0–36.0)
MCV: 95.2 fL (ref 80.0–100.0)
Platelets: 383 10*3/uL (ref 150–400)
RBC: 4.61 MIL/uL (ref 3.87–5.11)
RDW: 12.7 % (ref 11.5–15.5)
WBC: 19.2 10*3/uL — ABNORMAL HIGH (ref 4.0–10.5)
nRBC: 0 % (ref 0.0–0.2)

## 2019-12-23 LAB — BASIC METABOLIC PANEL
Anion gap: 13 (ref 5–15)
BUN: 20 mg/dL (ref 6–20)
CO2: 24 mmol/L (ref 22–32)
Calcium: 8.4 mg/dL — ABNORMAL LOW (ref 8.9–10.3)
Chloride: 105 mmol/L (ref 98–111)
Creatinine, Ser: 0.9 mg/dL (ref 0.44–1.00)
GFR calc Af Amer: >60 mL/min (ref >60)
GFR calc non Af Amer: >60 mL/min (ref >60)
Glucose, Bld: 106 mg/dL — ABNORMAL HIGH (ref 70–99)
Potassium: 3.8 mmol/L (ref 3.5–5.1)
Sodium: 142 mmol/L (ref 135–145)

## 2019-12-23 LAB — TROPONIN I (HIGH SENSITIVITY): Troponin I (High Sensitivity): 4 ng/L (ref <18)

## 2019-12-23 MED ORDER — SODIUM CHLORIDE 0.9% FLUSH: 3.0000 mL | Freq: Once | INTRAVENOUS | Status: DC

## 2019-12-23 NOTE — ED Triage Notes (Signed)
Pt complaining of pain in her chest that refers to her left should/neck area that started at apprx 1800.

## 2019-12-24 ENCOUNTER — Emergency Department (HOSPITAL_COMMUNITY)
Admission: EM | Admit: 2019-12-24 | Discharge: 2019-12-24 | Disposition: A | Discharge status: Home / Self Care | Payer: Medicare Other | Attending: Emergency Medicine | Admitting: Emergency Medicine

## 2019-12-24 DIAGNOSIS — S46812A Strain of other muscles, fascia and tendons at shoulder and upper arm level, left arm, initial encounter: Secondary | ICD-10-CM

## 2019-12-24 DIAGNOSIS — R0789 Other chest pain: Secondary | ICD-10-CM | POA: Diagnosis not present

## 2019-12-24 MED ORDER — DIAZEPAM 5 MG PO TABS
5.0000 mg | ORAL_TABLET | Freq: Once | ORAL | Status: AC
  Administered 2019-12-24: 5 mg via ORAL
  Filled 2019-12-24: qty 1

## 2019-12-24 MED ORDER — METHOCARBAMOL 500 MG PO TABS
500.0000 mg | ORAL_TABLET | Freq: Two times a day (BID) | ORAL | 0 refills | Status: DC
Start: 2019-12-24 — End: 2020-01-31

## 2019-12-24 NOTE — ED Provider Notes (Signed)
[facility]-EMERGENCY DEPT Provider Note   CSN: [id number] Arrival date & time: 12/23/19  2117     History Chief Complaint  Patient presents with  . Chest Pain    Ashley Hodge is a 59 y.o. female.  59 year old female presents with left-sided neck and shoulder pain is worse with certain movements.  Denies any anginal qualities to this.  No associated shortness of breath, diaphoresis.  Patient states that she does sleep on her left side and noticed that started yesterday.  Pain is better with remaining still.  No treatment use prior to arrival.        Past Medical History:  Diagnosis Date  . Anxiety   . Asthma   . COPD (chronic obstructive pulmonary disease) (HCC)   . High cholesterol   . Hypertension     Patient Active Problem List   Diagnosis Date Noted  . Acute exacerbation of chronic obstructive pulmonary disease (COPD) (HCC) 12/11/2019  . Pre-diabetes 12/11/2019  . COPD with acute exacerbation (HCC) 12/10/2019  . Acute respiratory failure with hypoxia (HCC) 12/10/2019  . Hypokalemia 12/10/2019  . Essential hypertension 12/10/2019  . Hyperlipidemia 12/10/2019    Past Surgical History:  Procedure Laterality Date  . TUBAL LIGATION       OB History    Gravida      Para      Term      Preterm      AB      Living  5     SAB      TAB      Ectopic      Multiple      Live Births              Family History  Problem Relation Age of Onset  . Heart disease Mother   . Hypertension Mother     Social History   Tobacco Use  . Smoking status: Current Every Day Smoker    Packs/day: 1.00    Types: Cigarettes  . Smokeless tobacco: Never Used  Vaping Use  . Vaping Use: Never used  Substance Use Topics  . 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 Drug use: Never    Home Medications Prior to Admission medications   Medication Sig Start Date End Date Taking? Authorizing Provider  albuterol (PROVENTIL  HFA;VENTOLIN HFA) 108 (90 Base) MCG/ACT inhaler Inhale 1-2 puffs into the lungs every 6 (six) hours as needed for shortness of breath or wheezing. 08/02/18   [provider]  amLODipine (NORVASC) 5 MG tablet Take 5 mg by mouth daily. 09/07/17   [provider]  benzonatate (TESSALON PERLES) 100 MG capsule Take 1 capsule (100 mg total) by mouth 3 (three) times daily as needed for cough. 12/11/19 12/10/20  Marguerita Merles Latif, DO  fluticasone (FLONASE) 50 MCG/ACT nasal spray Place 2 sprays into both nostrils daily. Patient not taking: Reported on 12/10/2019 11/04/19 11/03/20  Myra Rude, MD  guaiFENesin (MUCINEX) 600 MG 12 hr tablet Take 1 tablet (600 mg total) by mouth 2 (two) times daily. 12/11/19   Marguerita Merles Latif, DO  hydrochlorothiazide (HYDRODIURIL) 12.5 MG tablet Take 12.5 mg by mouth daily. 09/07/17   [provider]  ipratropium-albuterol (DUONEB) 0.5-2.5 (3) MG/3ML SOLN Take 3 mLs by nebulization every 6 (six) hours as needed (Wheezing and SOB). 12/11/19   Marguerita Merles Latif, DO  losartan (COZAAR) 50 MG tablet Take 50 mg by mouth daily. 09/07/17  [provider]  nicotine (NICODERM CQ - DOSED IN MG/24 HOURS) 21 mg/24hr patch Place 1 patch (21 mg total) onto the skin daily. 12/12/19   Marguerita Merles Latif, DO  Nutritional Supplements (,FEEDING SUPPLEMENT, PROSOURCE PLUS) liquid Take 30 mLs by mouth 2 (two) times daily between meals. 12/11/19   Marguerita Merles Latif, DO  pravastatin (PRAVACHOL) 80 MG tablet Take 80 mg by mouth daily. 05/21/19   [provider]  predniSONE (STERAPRED UNI-PAK 21 TAB) 10 MG (21) TBPK tablet Take 6 tablets on day 1, 5 tablets on day 2, 4 tablets on day 3, 3 tablets on day 4, 2 tablets on day 5, 1 tablet on the sixth and stop on Day 7 12/11/19   Marguerita Merles Latif, DO    Allergies    Lisinopril and Lisinopril  Review of Systems   Review of Systems  All other systems reviewed and are negative.   Physical Exam Updated  Vital Signs BP 126/80 (BP Location: Left Arm)   Pulse 80   Temp 98.3 F (36.8 C) (Oral)   Resp 14   Ht 1.524 m (5')   Wt 70.3 kg   SpO2 96%   BMI 30.27 kg/m   Physical Exam Vitals and nursing note reviewed.  Constitutional:      General: She is not in acute distress.    Appearance: Normal appearance. She is well-developed. She is not toxic-appearing.  HENT:     Head: Normocephalic and atraumatic.  Eyes:     General: Lids are normal.     Conjunctiva/sclera: Conjunctivae normal.     Pupils: Pupils are equal, round, and reactive to light.  Neck:     Thyroid: No thyroid mass.     Trachea: No tracheal deviation.  Cardiovascular:     Rate and Rhythm: Normal rate and regular rhythm.     Heart sounds: Normal heart sounds. No murmur heard.  No gallop.   Pulmonary:     Effort: Pulmonary effort is normal. No respiratory distress.     Breath sounds: Normal breath sounds. No stridor. No decreased breath sounds, wheezing, rhonchi or rales.  Abdominal:     General: Bowel sounds are normal. There is no distension.     Palpations: Abdomen is soft.     Tenderness: There is no abdominal tenderness. There is no rebound.  Musculoskeletal:        General: Normal range of motion.     Cervical back: Normal range of motion and neck supple.     Thoracic back: Tenderness present.       Back:  Skin:    General: Skin is warm and dry.     Findings: No abrasion or rash.  Neurological:     Mental Status: She is alert and oriented to person, place, and time.     GCS: GCS eye subscore is 4. GCS verbal subscore is 5. GCS motor subscore is 6.     Cranial Nerves: No cranial nerve deficit.     Sensory: No sensory deficit.  Psychiatric:        Speech: Speech normal.        Behavior: Behavior normal.     ED Results / Procedures / Treatments   Labs (all labs ordered are listed, but only abnormal results are displayed) Labs Reviewed  BASIC METABOLIC PANEL - Abnormal; Notable for the following  components:      Result Value   Glucose, Bld 106 (*)    Calcium 8.4 (*)  All other components within normal limits  CBC - Abnormal; Notable for the following components:   WBC 19.2 (*)    All other components within normal limits  TROPONIN I (HIGH SENSITIVITY)  TROPONIN I (HIGH SENSITIVITY)    EKG EKG Interpretation  Date/Time:  Monday December 23 2019 22:19:01 EDT Ventricular Rate:  86 PR Interval:    QRS Duration: 80 QT Interval:  347 QTC Calculation: 415 R Axis:   30 Text Interpretation: Sinus rhythm Borderline short PR interval Abnormal R-wave progression, early transition 12 Lead; Mason-Likar Confirmed by Lorre Nick ([id number]) on 12/24/2019 9:09:38 AM   Radiology DG Chest 2 View  Result Date: 12/23/2019 CLINICAL DATA:  Chest pain EXAM: CHEST - 2 VIEW COMPARISON:  12/07/2019 FINDINGS: The heart size and mediastinal contours are within normal limits. Both lungs are clear. The visualized skeletal structures are unremarkable. IMPRESSION: No active cardiopulmonary disease. Electronically Signed   By: Helyn Numbers MD   On: 12/23/2019 23:03    Procedures Procedures (including critical care time)  Medications Ordered in ED Medications  sodium chloride flush (NS) 0.9 % injection 3 mL (3 mLs Intravenous Not Given 12/24/19 0831)  diazepam (VALIUM) tablet 5 mg (has no administration in time range)    ED Course  I have reviewed the triage vital signs and the nursing notes.  Pertinent labs & imaging results that were available during my care of the patient were reviewed by me and considered in my medical decision making (see chart for details).    MDM Rules/Calculators/A&P                          Patient musculoskeletal chest pain.  Given Valium here and will discharge Final Clinical Impression(s) / ED Diagnoses Final diagnoses:  None    Rx / DC Orders ED Discharge Orders    None       Lorre Nick, MD 12/24/19 0930

## 2019-12-24 NOTE — Social Work (Signed)
TOC CSW was contacted by Olegario Messier, [phone].  Pt is in need of food.  Pt is on a fixed income, her and her son is in need of food.  Pt was just dc'd.  CSW will assist pt and son with need.  Velma Agnes Tarpley-Deroo, MSW, LCSW-A                  [facility] ED Transitions of CareClinical Social Worker [email or web address] [phone]

## 2019-12-25 ENCOUNTER — Encounter: Payer: Self-pay | Admitting: Pulmonary Disease

## 2019-12-25 ENCOUNTER — Other Ambulatory Visit: Payer: Self-pay

## 2019-12-25 ENCOUNTER — Telehealth: Payer: Self-pay | Admitting: Pulmonary Disease

## 2019-12-25 ENCOUNTER — Ambulatory Visit (INDEPENDENT_AMBULATORY_CARE_PROVIDER_SITE_OTHER): Payer: Medicare Other | Admitting: Pulmonary Disease

## 2019-12-25 VITALS — BP 122/64 | HR 114 | Temp 96.5°F | Ht 60.0 in | Wt 162.0 lb

## 2019-12-25 DIAGNOSIS — J449 Chronic obstructive pulmonary disease, unspecified: Secondary | ICD-10-CM | POA: Diagnosis not present

## 2019-12-25 MED ORDER — TRELEGY ELLIPTA 100-62.5-25 MCG/INH IN AEPB: 1.0000 | INHALATION_SPRAY | Freq: Every day | RESPIRATORY_TRACT | 11 refills | Status: DC

## 2019-12-25 NOTE — Telephone Encounter (Signed)
Spoke with pt  She states that she is needing alternative for trelegy  Copay for this was $700  Will forward to pharm team to see if they are able to check on comparable alternatives Thanks so much!

## 2019-12-25 NOTE — Patient Instructions (Addendum)
Nice to meet you!  Use Trelegy one puff a day for your breathing. Rinse your mouth out with water after you use it.  No need to use the portable oxygen, your oxygen levels are good!  We will see you back in 3 months with breathing tests and a walk test with Dr. Judeth Horn.

## 2019-12-25 NOTE — Progress Notes (Signed)
@Patient  ID: Karis JubaKathy Rufus, female    DOB: 05/30/1960, 59 y.o.   MRN: [id number]  Chief Complaint  Patient presents with  . Consult    referred for shob, was told she has copd, denies shob    Referring provider: Karl ItoPatel, Nilay, DO  HPI:  Ms. Montez MoritaCarter is a 59 year old with PMH of alcohol abuse in remission, tobacco abuse, borderline mild COPD (FEV1/FVC 0.7, FEV1 83% predicted 2018) whom we are seeing in hospital follow up and in consultation for evaluation of COPD per the request of Karl ItoNilay Patel, DO.   Patient was in normal state of health until mid to late July. She had onset of worsened cough. Increase in sputum production. Occured throughout the day. No clear difference morning, afternoon, or night. No clear exacerbating factors. Nothing made cough worse. She noted her O2 saturation dropped to 86% which prompted presentation to ED.  She has baseline daily cough that is usually productive. She attributes this to smoking. She also has baseline DOE that is not very limiting. She can do activities she wants but notes she needs to slow down from time to time. She attributes this to excess weight around her stomach. She denies seasonal allergies or recurrent bronchitis. No breathing troubles as a child. Never diagnosed with asthma.  She went to the ED 12/10/19. Note reviewed. Noted to drop to 84% on RA with exertion. Was wheezy and had cough. Reviewed CT chest 12/03/2019 with clear lungs, emphysematous changes noted. Admitted and placed on IV steroids and abx. Improved and discharged on steroid taper and oral antibiotics. She has completed both. Cough back to baseline. Denies significant DOE or SOB associated with symptoms that sent her to hospital.   PMH: HTN, HLD Past surgical history: Tubal ligation Family History: DM in mother, brother with brain tumors Social History: 20-30 pack year history, working on quitting and actively reducing number of cigarettes smoked a day, former alcoholic sober 6-7  years  [facility] reviewed and interpretered as with mild COPD FEV1/FVC 0.7 with FEV 1 83% predicted, TLC 109% predicted, RV 125% predicted demonstrating evidence of gas trapping.   WALK:  No flowsheet data found.  Imaging: DG Chest 2 View  Result Date: 12/23/2019 CLINICAL DATA:  Chest pain EXAM: CHEST - 2 VIEW COMPARISON:  12/07/2019 FINDINGS: The heart size and mediastinal contours are within normal limits. Both lungs are clear. The visualized skeletal structures are unremarkable. IMPRESSION: No active cardiopulmonary disease. Electronically Signed   By: Helyn NumbersAshesh  Parikh MD   On: 12/23/2019 23:03   DG Chest 2 View  Result Date: 12/07/2019 CLINICAL DATA:  59 year old female with history of shortness of breath since last night. EXAM: CHEST - 2 VIEW COMPARISON:  Chest x-ray 12/03/2019. FINDINGS: Lung volumes are normal. No consolidative airspace disease. No pleural effusions. No pneumothorax. No pulmonary nodule or mass noted. Pulmonary vasculature and the cardiomediastinal silhouette are within normal limits. Atherosclerosis in the thoracic aorta. IMPRESSION: 1.  No radiographic evidence of acute cardiopulmonary disease. 2. Aortic atherosclerosis. Electronically Signed   By: Trudie Reedaniel  Entrikin M.D.   On: 12/07/2019 09:55   DG Chest 2 View  Result Date: 12/03/2019 CLINICAL DATA:  Bilateral shoulder pain for 2 weeks. EXAM: CHEST - 2 VIEW COMPARISON:  November 19, 2019 FINDINGS: There is no evidence of acute infiltrate, pleural effusion or pneumothorax. A very faint, ill-defined 0.9 cm area of increased opacification is seen overlying the lateral aspect of the upper left lung. This represents a new  finding when compared to the prior study. The heart size and mediastinal contours are within normal limits. The visualized skeletal structures are unremarkable. IMPRESSION: Very faint, ill-defined area of increased opacification overlying the lateral aspect of the upper left lung. A small area of  atelectasis cannot be excluded. Follow-up to resolution is recommended. Electronically Signed   By: Aram Candela M.D.   On: 12/03/2019 02:14   CT Angio Chest PE W and/or Wo Contrast  Result Date: 12/03/2019 CLINICAL DATA:  Chest and shoulder pain. Positive D-dimer. EXAM: CT ANGIOGRAPHY CHEST WITH CONTRAST TECHNIQUE: Multidetector CT imaging of the chest was performed using the standard protocol during bolus administration of intravenous contrast. Multiplanar CT image reconstructions and MIPs were obtained to evaluate the vascular anatomy. CONTRAST:  OMNIPAQUE IOHEXOL 350 MG/ML SOLN COMPARISON:  None. FINDINGS: Cardiovascular: The heart is normal in size. No pericardial effusion. The aorta is normal in caliber. No dissection. Scattered atherosclerotic calcifications. The branch vessels are patent. No obvious coronary artery calcifications. The pulmonary arterial tree is fairly well opacified. No definite filling defects to suggest pulmonary embolism. Mediastinum/Nodes: Small scattered mediastinal and hilar lymph nodes. No mass or overt adenopathy. The esophagus is grossly normal. Lungs/Pleura: Changes of centrilobar emphysema. No infiltrates or effusions. No worrisome pulmonary lesions. No interstitial lung disease or bronchiectasis. Mild bronchial wall thickening and minimal surrounding inflammation in the left upper lobe suggesting focal bronchitis. Upper Abdomen: No significant upper abdominal findings. Musculoskeletal: No breast masses, supraclavicular or axillary adenopathy. The thyroid gland appears normal. The bony structures are unremarkable. Review of the MIP images confirms the above findings. IMPRESSION: 1. No CT findings for pulmonary embolism. 2. Normal caliber thoracic aorta and no dissection. 3. Changes of centrilobar emphysema. 4. Mild left upper lobe bronchitis.  No infiltrates. 5. Emphysema and aortic atherosclerosis. Aortic Atherosclerosis (ICD10-I70.0) and Emphysema (ICD10-J43.9).  Aortic Atherosclerosis (ICD10-I70.0) and Emphysema (ICD10-J43.9). Electronically Signed   By: Rudie Meyer M.D.   On: 12/03/2019 05:32    Lab Results:  CBC  Reviewed and as per EMR    Component Value Date/Time   WBC 19.2 (H) 12/23/2019 2255   RBC 4.61 12/23/2019 2255   HGB 14.3 12/23/2019 2255   HCT 43.9 12/23/2019 2255   PLT 383 12/23/2019 2255   MCV 95.2 12/23/2019 2255   MCH 31.0 12/23/2019 2255   MCHC 32.6 12/23/2019 2255   RDW 12.7 12/23/2019 2255   LYMPHSABS 0.9 12/11/2019 0829   MONOABS 0.2 12/11/2019 0829   EOSABS 0.0 12/11/2019 0829   BASOSABS 0.0 12/11/2019 0829    BMET    Component Value Date/Time   NA 142 12/23/2019 2255   K 3.8 12/23/2019 2255   CL 105 12/23/2019 2255   CO2 24 12/23/2019 2255   GLUCOSE 106 (H) 12/23/2019 2255   BUN 20 12/23/2019 2255   CREATININE 0.90 12/23/2019 2255   CALCIUM 8.4 (L) 12/23/2019 2255   GFRNONAA >60 12/23/2019 2255   GFRAA >60 12/23/2019 2255    BNP No results found for: BNP  ProBNP No results found for: PROBNP  Specialty Problems      Pulmonary Problems   Acute respiratory failure with hypoxia (HCC)   COPD with acute exacerbation (HCC)   Acute exacerbation of chronic obstructive pulmonary disease (COPD) (HCC)      Allergies  Allergen Reactions  . Lisinopril Other (See Comments) and Cough    cough  . Lisinopril     Immunization History  Administered Date(s) Administered  . PFIZER SARS-COV-2 Vaccination 08/11/2019, 09/25/2019  .  Pneumococcal Conjugate-13 04/27/2016  . Pneumococcal Polysaccharide-23 04/01/2013, 02/11/2015    Past Medical History:  Diagnosis Date  . Anxiety   . Asthma   . COPD (chronic obstructive pulmonary disease) (HCC)   . High cholesterol   . Hypertension     Tobacco History: Social History   Tobacco Use  Smoking Status Current Every Day Smoker  . Packs/day: 1.00  . Types: Cigarettes  Smokeless Tobacco Never Used  Tobacco Comment   1/2 pack a day-12/25/19   Ready to  quit: Not Answered Counseling given: Not Answered Comment: 1/2 pack a day-12/25/19   Continue to not smoke  Outpatient Encounter Medications as of 12/25/2019  Medication Sig  . albuterol (PROVENTIL HFA;VENTOLIN HFA) 108 (90 Base) MCG/ACT inhaler Inhale 1-2 puffs into the lungs every 6 (six) hours as needed for shortness of breath or wheezing.  Marland Kitchen amLODipine (NORVASC) 5 MG tablet Take 5 mg by mouth daily.  . hydrochlorothiazide (HYDRODIURIL) 12.5 MG tablet Take 12.5 mg by mouth daily.  Marland Kitchen losartan (COZAAR) 50 MG tablet Take 50 mg by mouth daily.  . pravastatin (PRAVACHOL) 80 MG tablet Take 80 mg by mouth daily.  . benzonatate (TESSALON PERLES) 100 MG capsule Take 1 capsule (100 mg total) by mouth 3 (three) times daily as needed for cough. (Patient not taking: Reported on 12/25/2019)  . fluticasone (FLONASE) 50 MCG/ACT nasal spray Place 2 sprays into both nostrils daily. (Patient not taking: Reported on 12/10/2019)  . Fluticasone-Umeclidin-Vilant (TRELEGY ELLIPTA) 100-62.5-25 MCG/INH AEPB Inhale 1 puff into the lungs daily.  Marland Kitchen guaiFENesin (MUCINEX) 600 MG 12 hr tablet Take 1 tablet (600 mg total) by mouth 2 (two) times daily. (Patient not taking: Reported on 12/25/2019)  . ipratropium-albuterol (DUONEB) 0.5-2.5 (3) MG/3ML SOLN Take 3 mLs by nebulization every 6 (six) hours as needed (Wheezing and SOB). (Patient not taking: Reported on 12/25/2019)  . methocarbamol (ROBAXIN) 500 MG tablet Take 1 tablet (500 mg total) by mouth 2 (two) times daily. (Patient not taking: Reported on 12/25/2019)  . nicotine (NICODERM CQ - DOSED IN MG/24 HOURS) 21 mg/24hr patch Place 1 patch (21 mg total) onto the skin daily. (Patient not taking: Reported on 12/25/2019)  . [DISCONTINUED] Nutritional Supplements (,FEEDING SUPPLEMENT, PROSOURCE PLUS) liquid Take 30 mLs by mouth 2 (two) times daily between meals.  . [DISCONTINUED] predniSONE (STERAPRED UNI-PAK 21 TAB) 10 MG (21) TBPK tablet Take 6 tablets on day 1, 5 tablets on day  2, 4 tablets on day 3, 3 tablets on day 4, 2 tablets on day 5, 1 tablet on the sixth and stop on Day 7   No facility-administered encounter medications on file as of 12/25/2019.     Review of Systems  Review of Systems  No chest pain with exertion, no orthopnea or PND. No weight loss. Comprehensive review of systems otherwise negative.  Physical Exam  BP 122/64 (BP Location: Left Arm, Cuff Size: Normal)   Pulse (!) 114   Temp (!) 96.5 F (35.8 C) (Temporal)   Ht 5' (1.524 m)   Wt 162 lb (73.5 kg)   SpO2 99%   BMI 31.64 kg/m   Wt Readings from Last 5 Encounters:  12/25/19 162 lb (73.5 kg)  12/23/19 155 lb (70.3 kg)  12/11/19 163 lb 12.8 oz (74.3 kg)  12/08/19 160 lb (72.6 kg)  12/07/19 160 lb (72.6 kg)    BMI Readings from Last 5 Encounters:  12/25/19 31.64 kg/m  12/23/19 30.27 kg/m  12/11/19 31.99 kg/m  12/08/19 31.25 kg/m  12/07/19 31.25 kg/m     Physical Exam General: Obese, in NAD Eyes: EOMI, no icterus ENT: no drainage, no crusting, no erythema of nose/nares Respiratory: CTAB, no wheeze, distant Cardiovascular: RRR, no murmurs Abd; Soft, NT, BS present MSK: no joint effusions, no synovitis Neuro: Normal gait aided with walker, CN intact Psych: AAO, normal mood, full affect   Assessment & Plan:   Ms. Lingenfelter is a 59 year old with PMH of alcohol abuse in remission, tobacco abuse, borderline mild COPD (FEV1/FVC 0.7, FEV1 83% predicted 2018) whom we are seeing in hospital follow up and in consultation for evaluation of COPD.  Mild obstruction 2018 with ongoing cigarette use. CT with emphysema. Symptoms of chronic bronchitis. Recent admission for COPD exacerbation with increase cough, mild hypoxemia. Symptoms resolved with steroids and abx. Well controlled symptoms prior. Given hospitalization, will institute ICS/LABA/LAMA with Trelegy. Repeat PFTs and at next visit. Oxygenating well, ok to not use ambulatory oxygen and monitor home O2 sats. If not  desaturation on formal walk will d/c home O2. Hope to se-escalate inhaler therapy if stable in coming months.   Has received both doses mRNA vaccine for Covid.   Return in about 3 months (around 03/26/2020).   Karren Burly, MD 12/25/2019

## 2019-12-26 NOTE — Telephone Encounter (Signed)
Called pharmacy patient's Trelegy copay is zero. They could not say why patient was quoted the cash price. Called patient, her son will go pick up prescription.  Patient states she will stop by office for her oxygen tank on Monday.

## 2020-01-14 ENCOUNTER — Other Ambulatory Visit: Payer: Self-pay

## 2020-01-14 DIAGNOSIS — Z5321 Procedure and treatment not carried out due to patient leaving prior to being seen by health care provider: Secondary | ICD-10-CM | POA: Insufficient documentation

## 2020-01-14 DIAGNOSIS — R202 Paresthesia of skin: Secondary | ICD-10-CM | POA: Insufficient documentation

## 2020-01-15 ENCOUNTER — Encounter (HOSPITAL_COMMUNITY): Payer: Self-pay | Admitting: Emergency Medicine

## 2020-01-15 ENCOUNTER — Emergency Department (HOSPITAL_COMMUNITY)
Admission: EM | Admit: 2020-01-15 | Discharge: 2020-01-15 | Disposition: A | Discharge status: Home / Self Care | Payer: Medicare Other | Attending: Emergency Medicine | Admitting: Emergency Medicine

## 2020-01-15 DIAGNOSIS — R202 Paresthesia of skin: Secondary | ICD-10-CM | POA: Diagnosis not present

## 2020-01-15 LAB — COMPREHENSIVE METABOLIC PANEL
ALT: 20 U/L (ref 0–44)
AST: 20 U/L (ref 15–41)
Albumin: 4 g/dL (ref 3.5–5.0)
Alkaline Phosphatase: 99 U/L (ref 38–126)
Anion gap: 15 (ref 5–15)
BUN: 8 mg/dL (ref 6–20)
CO2: 22 mmol/L (ref 22–32)
Calcium: 9.1 mg/dL (ref 8.9–10.3)
Chloride: 100 mmol/L (ref 98–111)
Creatinine, Ser: 0.83 mg/dL (ref 0.44–1.00)
GFR calc Af Amer: >60 mL/min (ref >60)
GFR calc non Af Amer: >60 mL/min (ref >60)
Glucose, Bld: 101 mg/dL — ABNORMAL HIGH (ref 70–99)
Potassium: 3.3 mmol/L — ABNORMAL LOW (ref 3.5–5.1)
Sodium: 137 mmol/L (ref 135–145)
Total Bilirubin: 0.3 mg/dL (ref 0.3–1.2)
Total Protein: 7.3 g/dL (ref 6.5–8.1)

## 2020-01-15 LAB — CBC
HCT: 43 % (ref 36.0–46.0)
Hemoglobin: 13.9 g/dL (ref 12.0–15.0)
MCH: 30.4 pg (ref 26.0–34.0)
MCHC: 32.3 g/dL (ref 30.0–36.0)
MCV: 94.1 fL (ref 80.0–100.0)
Platelets: 358 10*3/uL (ref 150–400)
RBC: 4.57 MIL/uL (ref 3.87–5.11)
RDW: 12.1 % (ref 11.5–15.5)
WBC: 12.1 10*3/uL — ABNORMAL HIGH (ref 4.0–10.5)
nRBC: 0 % (ref 0.0–0.2)

## 2020-01-15 LAB — TYPE AND SCREEN
ABO/RH(D): O NEG
Antibody Screen: NEGATIVE

## 2020-01-15 LAB — I-STAT BETA HCG BLOOD, ED (MC, WL, AP ONLY): I-stat hCG, quantitative: 9.2 m[IU]/mL — ABNORMAL HIGH (ref <5)

## 2020-01-15 NOTE — ED Notes (Signed)
Called of vitals no answer and I looked outside

## 2020-01-15 NOTE — ED Triage Notes (Signed)
Pt from home w/ multiple complaints, reports black stools (called PCP about this concern.) Also reports "I just don't feel good."  Also complains of tingling right arm, no neuro deficits.

## 2020-01-16 ENCOUNTER — Telehealth: Payer: Self-pay | Admitting: Pulmonary Disease

## 2020-01-17 NOTE — Telephone Encounter (Signed)
Pt was seen 8/11 and a walk test was performed per Dr. Laurena Spies documentation, and patient did not desaturate.  We have never prescribed O2 for pt and documented in her OV note that she does not need to wear any O2.    Spoke with pt, states that her O2 has been below 90% in home and she is moving to a place that cannot accomodate her tanks, needs a POC.  I advised we would need to test her first to see if she qualifies for a POC.  Pt scheduled 9/8 with BW to eval for POC.  Nothing further needed at this time- will close encounter.

## 2020-01-22 ENCOUNTER — Ambulatory Visit: Payer: Medicare Other | Admitting: Primary Care

## 2020-01-30 ENCOUNTER — Other Ambulatory Visit: Payer: Self-pay

## 2020-01-30 ENCOUNTER — Encounter (HOSPITAL_COMMUNITY): Payer: Self-pay

## 2020-01-30 DIAGNOSIS — I10 Essential (primary) hypertension: Secondary | ICD-10-CM | POA: Diagnosis not present

## 2020-01-30 DIAGNOSIS — J45909 Unspecified asthma, uncomplicated: Secondary | ICD-10-CM | POA: Insufficient documentation

## 2020-01-30 DIAGNOSIS — J449 Chronic obstructive pulmonary disease, unspecified: Secondary | ICD-10-CM | POA: Insufficient documentation

## 2020-01-30 DIAGNOSIS — M25511 Pain in right shoulder: Secondary | ICD-10-CM | POA: Diagnosis not present

## 2020-01-30 DIAGNOSIS — Z79899 Other long term (current) drug therapy: Secondary | ICD-10-CM | POA: Diagnosis not present

## 2020-01-30 DIAGNOSIS — F17210 Nicotine dependence, cigarettes, uncomplicated: Secondary | ICD-10-CM | POA: Insufficient documentation

## 2020-01-30 NOTE — ED Triage Notes (Signed)
Patient arrived stating that a few days ago she began having right shoulder/upper arm pain, reports taking ibuprofen with little relief.

## 2020-01-31 ENCOUNTER — Emergency Department (HOSPITAL_COMMUNITY)
Admission: EM | Admit: 2020-01-31 | Discharge: 2020-01-31 | Disposition: A | Discharge status: Home / Self Care | Payer: Medicare Other | Attending: Emergency Medicine | Admitting: Emergency Medicine

## 2020-01-31 DIAGNOSIS — M25511 Pain in right shoulder: Secondary | ICD-10-CM

## 2020-01-31 MED ORDER — LIDOCAINE 5 % EX PTCH
1.0000 | MEDICATED_PATCH | CUTANEOUS | Status: DC
  Administered 2020-01-31: 1 via TRANSDERMAL
  Filled 2020-01-31 (×2): qty 1

## 2020-01-31 MED ORDER — METHOCARBAMOL 500 MG PO TABS
500.0000 mg | ORAL_TABLET | Freq: Once | ORAL | Status: AC
  Administered 2020-01-31: 500 mg via ORAL
  Filled 2020-01-31: qty 1

## 2020-01-31 MED ORDER — METHOCARBAMOL 500 MG PO TABS
500.0000 mg | ORAL_TABLET | Freq: Two times a day (BID) | ORAL | 0 refills | Status: DC
Start: 2020-01-31 — End: 2020-03-16

## 2020-01-31 MED ORDER — MELOXICAM 15 MG PO TABS
15.0000 mg | ORAL_TABLET | Freq: Every day | ORAL | 0 refills | Status: DC
Start: 2020-01-31 — End: 2020-03-16

## 2020-01-31 MED ORDER — OXYCODONE-ACETAMINOPHEN 5-325 MG PO TABS
2.0000 | ORAL_TABLET | Freq: Once | ORAL | Status: AC
  Administered 2020-01-31: 2 via ORAL
  Filled 2020-01-31: qty 2

## 2020-01-31 MED ORDER — KETOROLAC TROMETHAMINE 60 MG/2ML IM SOLN
60.0000 mg | Freq: Once | INTRAMUSCULAR | Status: DC
  Filled 2020-01-31: qty 2

## 2020-01-31 MED ORDER — LIDOCAINE 5 % EX PTCH: 1.0000 | MEDICATED_PATCH | CUTANEOUS | 0 refills | Status: DC

## 2020-01-31 NOTE — ED Provider Notes (Signed)
[facility]-EMERGENCY DEPT Provider Note   CSN: [id number] Arrival date & time: 01/30/20  2245     History Chief Complaint  Patient presents with  . Shoulder Pain    Ashley Hodge is a 59 y.o. female.  59 year old female here with multiple days of right posterior shoulder pain.  Patient states that it is very sharp nature.  Worse with movement.  She is tried heat without relief.  Started ibuprofen out relief.  States is worse at night when trying to sleep.  No known injuries.  She is right-handed.  She has had it before but not quite this bad.  No other associated symptoms.   Shoulder Pain      Past Medical History:  Diagnosis Date  . Anxiety   . Asthma   . COPD (chronic obstructive pulmonary disease) (HCC)   . High cholesterol   . Hypertension     Patient Active Problem List   Diagnosis Date Noted  . COPD with chronic bronchitis and emphysema (HCC) 12/11/2019  . Pre-diabetes 12/11/2019  . Hypokalemia 12/10/2019  . Essential hypertension 12/10/2019  . Hyperlipidemia 12/10/2019    Past Surgical History:  Procedure Laterality Date  . TUBAL LIGATION       OB History    Gravida      Para      Term      Preterm      AB      Living  5     SAB      TAB      Ectopic      Multiple      Live Births              Family History  Problem Relation Age of Onset  . Heart disease Mother   . Hypertension Mother     Social History   Tobacco Use  . Smoking status: Current Every Day Smoker    Packs/day: 1.00    Types: Cigarettes  . Smokeless tobacco: Never Used  . Tobacco comment: 1/2 pack a day-12/25/19  Vaping Use  . Vaping Use: Never used  Substance Use Topics  . 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 Drug use: Never    Home Medications Prior to Admission medications   Medication Sig Start Date End Date Taking? Authorizing Provider  albuterol (PROVENTIL HFA;VENTOLIN HFA) 108 (90 Base) MCG/ACT  inhaler Inhale 1-2 puffs into the lungs every 6 (six) hours as needed for shortness of breath or wheezing. 08/02/18   [provider]  amLODipine (NORVASC) 5 MG tablet Take 5 mg by mouth daily. 09/07/17   [provider]  Fluticasone-Umeclidin-Vilant (TRELEGY ELLIPTA) 100-62.5-25 MCG/INH AEPB Inhale 1 puff into the lungs daily. 12/25/19   Hunsucker, Lesia Sago, MD  hydrochlorothiazide (HYDRODIURIL) 12.5 MG tablet Take 12.5 mg by mouth daily. 09/07/17   [provider]  lidocaine (LIDODERM) 5 % Place 1 patch onto the skin daily. Remove & Discard patch within 12 hours or as directed by MD 01/31/20   Khaliq Turay, Barbara Cower, MD  losartan (COZAAR) 50 MG tablet Take 50 mg by mouth daily. 09/07/17   [provider]  meloxicam (MOBIC) 15 MG tablet Take 1 tablet (15 mg total) by mouth daily. 01/31/20   Zuriel Roskos, Barbara Cower, MD  methocarbamol (ROBAXIN) 500 MG tablet Take 1 tablet (500 mg total) by mouth 2 (two) times daily. 01/31/20   Bennet Kujawa, Barbara Cower, MD  pravastatin (PRAVACHOL) 80 MG tablet Take 80  mg by mouth daily. 05/21/19   [provider]  fluticasone (FLONASE) 50 MCG/ACT nasal spray Place 2 sprays into both nostrils daily. Patient not taking: Reported on 12/10/2019 11/04/19 01/31/20  Myra Rude, MD  ipratropium-albuterol (DUONEB) 0.5-2.5 (3) MG/3ML SOLN Take 3 mLs by nebulization every 6 (six) hours as needed (Wheezing and SOB). Patient not taking: Reported on 12/25/2019 12/11/19 01/31/20  Marguerita Merles Latif, DO    Allergies    Lisinopril and Lisinopril  Review of Systems   Review of Systems  All other systems reviewed and are negative.   Physical Exam Updated Vital Signs BP 126/69 (BP Location: Left Arm)   Pulse 76   Temp 98.1 F (36.7 C) (Oral)   Resp 16   Ht 5' (1.524 m)   Wt 72.6 kg   SpO2 98%   BMI 31.25 kg/m   Physical Exam Vitals and nursing note reviewed.  Constitutional:      Appearance: She is well-developed.  HENT:     Head: Normocephalic and  atraumatic.     Nose: No congestion or rhinorrhea.     Mouth/Throat:     Mouth: Mucous membranes are moist.     Pharynx: Oropharynx is clear.  Eyes:     Pupils: Pupils are equal, round, and reactive to light.  Cardiovascular:     Rate and Rhythm: Normal rate and regular rhythm.  Pulmonary:     Effort: No respiratory distress.     Breath sounds: No stridor.  Abdominal:     General: Abdomen is flat. There is no distension.  Musculoskeletal:        General: Tenderness (tight muscle bundle in right scapular area c/w likely spasm) present. Normal range of motion.     Cervical back: Normal range of motion.  Skin:    General: Skin is warm and dry.  Neurological:     General: No focal deficit present.     Mental Status: She is alert.     ED Results / Procedures / Treatments   Labs (all labs ordered are listed, but only abnormal results are displayed) Labs Reviewed - No data to display  EKG None  Radiology No results found.  Procedures Procedures (including critical care time)  Medications Ordered in ED Medications  lidocaine (LIDODERM) 5 % 1 patch (1 patch Transdermal Patch Applied 01/31/20 0603)  ketorolac (TORADOL) injection 60 mg (60 mg Intramuscular Refused 01/31/20 0602)  methocarbamol (ROBAXIN) tablet 500 mg (500 mg Oral Given 01/31/20 0603)  oxyCODONE-acetaminophen (PERCOCET/ROXICET) 5-325 MG per tablet 2 tablet (2 tablets Oral Given 01/31/20 3810)    ED Course  I have reviewed the triage vital signs and the nursing notes.  Pertinent labs & imaging results that were available during my care of the patient were reviewed by me and considered in my medical decision making (see chart for details).    MDM Rules/Calculators/A&P                          No indication for interval to be likely muscle spasm as she has a palpable spasm in her back that directly reproduces her pain.  I discussed with her there is not a lot you can do about it besides muscle lectures,  heat/cold, massage and range of motion and just wait it out.  Will prescribe some anti-inflammatories and muscle relaxers to hopefully alleviate some the symptoms but with the PCP follow-up for further management.  Final Clinical Impression(s) / ED  Diagnoses Final diagnoses:  Acute pain of right shoulder    Rx / DC Orders ED Discharge Orders         Ordered    methocarbamol (ROBAXIN) 500 MG tablet  2 times daily        01/31/20 0556    lidocaine (LIDODERM) 5 %  Every 24 hours        01/31/20 0556    meloxicam (MOBIC) 15 MG tablet  Daily        01/31/20 0556           Azan Maneri, Barbara Cower, MD 01/31/20 [phone]

## 2020-02-02 ENCOUNTER — Emergency Department (HOSPITAL_COMMUNITY)
Admission: EM | Admit: 2020-02-02 | Discharge: 2020-02-02 | Disposition: A | Discharge status: Home / Self Care | Payer: Medicare Other | Attending: Emergency Medicine | Admitting: Emergency Medicine

## 2020-02-02 ENCOUNTER — Encounter (HOSPITAL_COMMUNITY): Payer: Self-pay | Admitting: Emergency Medicine

## 2020-02-02 ENCOUNTER — Other Ambulatory Visit: Payer: Self-pay

## 2020-02-02 DIAGNOSIS — M546 Pain in thoracic spine: Secondary | ICD-10-CM | POA: Insufficient documentation

## 2020-02-02 DIAGNOSIS — Z5321 Procedure and treatment not carried out due to patient leaving prior to being seen by health care provider: Secondary | ICD-10-CM | POA: Diagnosis not present

## 2020-02-02 DIAGNOSIS — M25511 Pain in right shoulder: Secondary | ICD-10-CM | POA: Diagnosis not present

## 2020-02-02 NOTE — ED Triage Notes (Addendum)
Pt c/o mid back pain radiating into right shoulder off and on x's 1 week.  Pt was seen at [facility] on Friday for same.,  St's muscle relaxers and Lidoderm patch is not working  Also st's the Mobic made her shake

## 2020-02-02 NOTE — ED Notes (Signed)
Was not present during vital recheck in lobby, called for pt outside with no answer.

## 2020-02-26 ENCOUNTER — Other Ambulatory Visit: Payer: Self-pay | Admitting: Registered Nurse

## 2020-02-26 DIAGNOSIS — Z1231 Encounter for screening mammogram for malignant neoplasm of breast: Secondary | ICD-10-CM

## 2020-03-02 ENCOUNTER — Other Ambulatory Visit: Payer: Self-pay | Admitting: General Practice

## 2020-03-02 DIAGNOSIS — F17210 Nicotine dependence, cigarettes, uncomplicated: Secondary | ICD-10-CM

## 2020-03-02 DIAGNOSIS — Z87891 Personal history of nicotine dependence: Secondary | ICD-10-CM

## 2020-03-09 ENCOUNTER — Emergency Department (HOSPITAL_COMMUNITY)
Admission: EM | Admit: 2020-03-09 | Discharge: 2020-03-09 | Disposition: A | Discharge status: Home / Self Care | Payer: Medicare Other | Attending: Emergency Medicine | Admitting: Emergency Medicine

## 2020-03-09 ENCOUNTER — Encounter (HOSPITAL_COMMUNITY): Payer: Self-pay

## 2020-03-09 ENCOUNTER — Emergency Department (HOSPITAL_COMMUNITY): Payer: Medicare Other

## 2020-03-09 ENCOUNTER — Other Ambulatory Visit: Payer: Self-pay

## 2020-03-09 DIAGNOSIS — R109 Unspecified abdominal pain: Secondary | ICD-10-CM | POA: Diagnosis present

## 2020-03-09 DIAGNOSIS — B9689 Other specified bacterial agents as the cause of diseases classified elsewhere: Secondary | ICD-10-CM | POA: Diagnosis not present

## 2020-03-09 DIAGNOSIS — N390 Urinary tract infection, site not specified: Secondary | ICD-10-CM | POA: Diagnosis not present

## 2020-03-09 DIAGNOSIS — J449 Chronic obstructive pulmonary disease, unspecified: Secondary | ICD-10-CM | POA: Diagnosis not present

## 2020-03-09 DIAGNOSIS — F17210 Nicotine dependence, cigarettes, uncomplicated: Secondary | ICD-10-CM | POA: Diagnosis not present

## 2020-03-09 DIAGNOSIS — J45909 Unspecified asthma, uncomplicated: Secondary | ICD-10-CM | POA: Insufficient documentation

## 2020-03-09 DIAGNOSIS — K8020 Calculus of gallbladder without cholecystitis without obstruction: Secondary | ICD-10-CM | POA: Insufficient documentation

## 2020-03-09 DIAGNOSIS — I10 Essential (primary) hypertension: Secondary | ICD-10-CM | POA: Diagnosis not present

## 2020-03-09 LAB — COMPREHENSIVE METABOLIC PANEL
ALT: 17 U/L (ref 0–44)
AST: 17 U/L (ref 15–41)
Albumin: 4.3 g/dL (ref 3.5–5.0)
Alkaline Phosphatase: 86 U/L (ref 38–126)
Anion gap: 12 (ref 5–15)
BUN: 22 mg/dL — ABNORMAL HIGH (ref 6–20)
CO2: 27 mmol/L (ref 22–32)
Calcium: 9.3 mg/dL (ref 8.9–10.3)
Chloride: 103 mmol/L (ref 98–111)
Creatinine, Ser: 1.18 mg/dL — ABNORMAL HIGH (ref 0.44–1.00)
GFR, Estimated: 53 mL/min — ABNORMAL LOW (ref >60)
Glucose, Bld: 145 mg/dL — ABNORMAL HIGH (ref 70–99)
Potassium: 3.7 mmol/L (ref 3.5–5.1)
Sodium: 142 mmol/L (ref 135–145)
Total Bilirubin: 0.4 mg/dL (ref 0.3–1.2)
Total Protein: 7.5 g/dL (ref 6.5–8.1)

## 2020-03-09 LAB — URINALYSIS, ROUTINE W REFLEX MICROSCOPIC
Bilirubin Urine: NEGATIVE
Glucose, UA: NEGATIVE mg/dL
Hgb urine dipstick: NEGATIVE
Ketones, ur: NEGATIVE mg/dL
Nitrite: NEGATIVE
Protein, ur: NEGATIVE mg/dL
Specific Gravity, Urine: 1.017 (ref 1.005–1.030)
pH: 5 (ref 5.0–8.0)

## 2020-03-09 LAB — CBC
HCT: 44.2 % (ref 36.0–46.0)
Hemoglobin: 14.5 g/dL (ref 12.0–15.0)
MCH: 31.3 pg (ref 26.0–34.0)
MCHC: 32.8 g/dL (ref 30.0–36.0)
MCV: 95.3 fL (ref 80.0–100.0)
Platelets: 349 10*3/uL (ref 150–400)
RBC: 4.64 MIL/uL (ref 3.87–5.11)
RDW: 11.9 % (ref 11.5–15.5)
WBC: 12.5 10*3/uL — ABNORMAL HIGH (ref 4.0–10.5)
nRBC: 0 % (ref 0.0–0.2)

## 2020-03-09 LAB — LIPASE, BLOOD: Lipase: 32 U/L (ref 11–51)

## 2020-03-09 MED ORDER — HYDROCODONE-ACETAMINOPHEN 5-325 MG PO TABS
1.0000 | ORAL_TABLET | Freq: Once | ORAL | Status: AC
  Administered 2020-03-09: 1 via ORAL
  Filled 2020-03-09: qty 1

## 2020-03-09 MED ORDER — CEPHALEXIN 500 MG PO CAPS: 500.0000 mg | ORAL_CAPSULE | Freq: Three times a day (TID) | ORAL | 0 refills | Status: AC

## 2020-03-09 NOTE — ED Triage Notes (Signed)
Patient c/o left abdominal pain that started this AM. Patient denies any N/V/D

## 2020-03-09 NOTE — Discharge Instructions (Signed)
Please continue treating your pain with over-the-counter medications as needed. Take the antibiotic, Keflex, as directed until gone. Please follow-up with your primary care provider symptoms persist. Return for high fevers, significantly worsening symptoms.

## 2020-03-09 NOTE — ED Provider Notes (Signed)
[facility]-EMERGENCY DEPT Provider Note   CSN: [id number] Arrival date & time: 03/09/20  1827     History Chief Complaint  Patient presents with  . Abdominal Pain    Ashley Hodge is a 59 y.o. female past medical history of COPD, hypertension, presenting emergency department with complaint of left side pain began this morning upon awakening.  She states she feels as though it may be a pulled muscle, thinks that she may have slept on her left side wrong.  She states she presents to the ED due to the location of her pain being on her left side.  She has some intermittent associated nausea though no associated urinary symptoms.  Denies diarrhea or constipation, fevers, chest pain, shortness of breath.  She is treated her symptoms with Tylenol.   The history is provided by the patient.       Past Medical History:  Diagnosis Date  . Anxiety   . Asthma   . COPD (chronic obstructive pulmonary disease) (HCC)   . High cholesterol   . Hypertension     Patient Active Problem List   Diagnosis Date Noted  . COPD with chronic bronchitis and emphysema (HCC) 12/11/2019  . Pre-diabetes 12/11/2019  . Hypokalemia 12/10/2019  . Essential hypertension 12/10/2019  . Hyperlipidemia 12/10/2019    Past Surgical History:  Procedure Laterality Date  . TUBAL LIGATION       OB History    Gravida      Para      Term      Preterm      AB      Living  5     SAB      TAB      Ectopic      Multiple      Live Births              Family History  Problem Relation Age of Onset  . Heart disease Mother   . Hypertension Mother     Social History   Tobacco Use  . Smoking status: Current Every Day Smoker    Packs/day: 0.50    Types: Cigarettes  . Smokeless tobacco: Never Used  . Tobacco comment: 1/2 pack a day-12/25/19  Vaping Use  . Vaping Use: Never used  Substance Use Topics  . Alcohol use: Not Currently  . Drug use: Never    Home  Medications Prior to Admission medications   Medication Sig Start Date End Date Taking? Authorizing Provider  albuterol (PROVENTIL HFA;VENTOLIN HFA) 108 (90 Base) MCG/ACT inhaler Inhale 1-2 puffs into the lungs every 6 (six) hours as needed for shortness of breath or wheezing. 08/02/18  Yes [provider]  amLODipine (NORVASC) 5 MG tablet Take 5 mg by mouth daily. 09/07/17  Yes [provider]  Fluticasone-Umeclidin-Vilant (TRELEGY ELLIPTA) 100-62.5-25 MCG/INH AEPB Inhale 1 puff into the lungs daily. 12/25/19  Yes Hunsucker, Lesia Sago, MD  hydrochlorothiazide (HYDRODIURIL) 12.5 MG tablet Take 12.5 mg by mouth daily. 09/07/17  Yes [provider]  losartan (COZAAR) 50 MG tablet Take 50 mg by mouth daily. 09/07/17  Yes [provider]  pravastatin (PRAVACHOL) 80 MG tablet Take 80 mg by mouth daily. 05/21/19  Yes [provider]  cephALEXin (KEFLEX) 500 MG capsule Take 1 capsule (500 mg total) by mouth 3 (three) times daily for 7 days. 03/09/20 03/16/20  Levada Bowersox, [place] N, PA-C  lidocaine (LIDODERM) 5 % Place 1 patch onto the skin daily. Remove & Discard  patch within 12 hours or as directed by MD Patient not taking: Reported on 03/09/2020 01/31/20   Mesner, Barbara Cower, MD  meloxicam (MOBIC) 15 MG tablet Take 1 tablet (15 mg total) by mouth daily. Patient not taking: Reported on 03/09/2020 01/31/20   Mesner, Barbara Cower, MD  methocarbamol (ROBAXIN) 500 MG tablet Take 1 tablet (500 mg total) by mouth 2 (two) times daily. Patient not taking: Reported on 03/09/2020 01/31/20   Mesner, Barbara Cower, MD  fluticasone [facility]) 50 MCG/ACT nasal spray Place 2 sprays into both nostrils daily. Patient not taking: Reported on 12/10/2019 11/04/19 01/31/20  Myra Rude, MD  ipratropium-albuterol (DUONEB) 0.5-2.5 (3) MG/3ML SOLN Take 3 mLs by nebulization every 6 (six) hours as needed (Wheezing and SOB). Patient not taking: Reported on 12/25/2019 12/11/19 01/31/20  Marguerita Merles Latif, DO     Allergies    Lisinopril and Lisinopril  Review of Systems   Review of Systems  All other systems reviewed and are negative.   Physical Exam Updated Vital Signs BP 123/80   Pulse 86   Temp 98 F (36.7 C)   Resp 16   Ht 5' (1.524 m)   Wt 74.8 kg   SpO2 94%   BMI 32.22 kg/m   Physical Exam Vitals and nursing note reviewed.  Constitutional:      General: She is not in acute distress.    Appearance: She is well-developed. She is not ill-appearing.  HENT:     Head: Normocephalic and atraumatic.  Eyes:     Conjunctiva/sclera: Conjunctivae normal.  Cardiovascular:     Rate and Rhythm: Normal rate and regular rhythm.  Pulmonary:     Effort: Pulmonary effort is normal. No respiratory distress.  Abdominal:     General: Abdomen is flat. Bowel sounds are normal.     Palpations: Abdomen is soft.     Tenderness: There is abdominal tenderness. There is no right CVA tenderness, left CVA tenderness, guarding or rebound.       Comments: Left flank/left lateral abdominal TTP. No skin changes  Skin:    General: Skin is warm.  Neurological:     Mental Status: She is alert.  Psychiatric:        Behavior: Behavior normal.     ED Results / Procedures / Treatments   Labs (all labs ordered are listed, but only abnormal results are displayed) Labs Reviewed  COMPREHENSIVE METABOLIC PANEL - Abnormal; Notable for the following components:      Result Value   Glucose, Bld 145 (*)    BUN 22 (*)    Creatinine, Ser 1.18 (*)    GFR, Estimated 53 (*)    All other components within normal limits  CBC - Abnormal; Notable for the following components:   WBC 12.5 (*)    All other components within normal limits  URINALYSIS, ROUTINE W REFLEX MICROSCOPIC - Abnormal; Notable for the following components:   Leukocytes,Ua SMALL (*)    Bacteria, UA RARE (*)    All other components within normal limits  URINE CULTURE  LIPASE, BLOOD    EKG None  Radiology CT Renal Stone  Study  Result Date: 03/09/2020 CLINICAL DATA:  Left-sided flank pain EXAM: CT ABDOMEN AND PELVIS WITHOUT CONTRAST TECHNIQUE: Multidetector CT imaging of the abdomen and pelvis was performed following the standard protocol without IV contrast. COMPARISON:  CT 02/24/2018 FINDINGS: Lower chest: Lung bases demonstrate minimal peripheral fibrosis. No acute consolidation or effusion. Normal cardiac size. Hepatobiliary: Tiny gallstone at the gallbladder neck. No  biliary dilatation. No focal hepatic abnormality. Pancreas: Unremarkable. No pancreatic ductal dilatation or surrounding inflammatory changes. Spleen: Normal in size without focal abnormality. Adrenals/Urinary Tract: Adrenal glands are unremarkable. Kidneys are normal, without renal calculi, focal lesion, or hydronephrosis. Bladder is unremarkable. Stomach/Bowel: Stomach is within normal limits. Appendix appears normal. No evidence of bowel wall thickening, distention, or inflammatory changes. Vascular/Lymphatic: Mild aortic atherosclerosis. No aneurysm. No suspicious nodes. Reproductive: Uterus and bilateral adnexa are unremarkable. Other: Negative for free air or free fluid. Musculoskeletal: No acute or significant osseous findings. IMPRESSION: 1. No CT evidence for acute intra-abdominal or pelvic abnormality. 2. Tiny gallstone at the gallbladder neck. Aortic Atherosclerosis (ICD10-I70.0). Electronically Signed   By: Jasmine Pang M.D.   On: 03/09/2020 22:53    Procedures Procedures (including critical care time)  Medications Ordered in ED Medications  HYDROcodone-acetaminophen (NORCO/VICODIN) 5-325 MG per tablet 1 tablet (1 tablet Oral Given 03/09/20 2343)    ED Course  I have reviewed the triage vital signs and the nursing notes.  Pertinent labs & imaging results that were available during my care of the patient were reviewed by me and considered in my medical decision making (see chart for details).    MDM Rules/Calculators/A&P                           Patient presenting with left sided flank pain that began this morning upon awakening.  She states it feels like a pulled muscle, is reproducible with movement and palpation, think she slept on it wrong, however presents for evaluation given the laterality of the pain.  She states she is knows that heart attacks and other more concerning things can occur on the left side therefore she presented for evaluation.  No chest pain or shortness of breath, no urinary symptoms, no fevers or chills, no abdominal symptoms.  SHe is very well-appearing, no distress, afebrile.   She has reproducible tenderness to the left flank and is having pain with movement.  Labs with slight leukocytosis of 12.5, creatinine of 1.2.  Lipase within normal limits.  UA with rare bacteria, 6-10 white cells and small leuks, no nitrites.  CT stone study is negative for inflammatory changes about the kidney or stone, no other obvious acute intra-abdominal pathology.  Incidental finding of small gallstones the gallbladder neck is discussed with patient.  Suspect symptoms seem more likely musculoskeletal in nature though given mild leukocytosis and UA findings, will cover with Keflex.  Seems less likely due to pyelonephritis given mild UA findings, absence of urinary sx, and absence of findings on CT stone study.  She is instructed to treat pain with over-the-counter medications, and follow closely with PCP.  Urine culture is pending.  Final Clinical Impression(s) / ED Diagnoses Final diagnoses:  Flank pain  Acute lower UTI    Rx / DC Orders ED Discharge Orders         Ordered    cephALEXin (KEFLEX) 500 MG capsule  3 times daily        03/09/20 2337           Avanni Turnbaugh, [place] N, PA-C 03/10/20 Levy Sjogren, MD 03/11/20 [phone]

## 2020-03-11 LAB — URINE CULTURE

## 2020-03-15 ENCOUNTER — Other Ambulatory Visit: Payer: Self-pay

## 2020-03-15 ENCOUNTER — Emergency Department (HOSPITAL_COMMUNITY)
Admission: EM | Admit: 2020-03-15 | Discharge: 2020-03-16 | Disposition: A | Discharge status: Home / Self Care | Payer: Medicare Other | Attending: Emergency Medicine | Admitting: Emergency Medicine

## 2020-03-15 ENCOUNTER — Emergency Department (HOSPITAL_COMMUNITY): Payer: Medicare Other

## 2020-03-15 ENCOUNTER — Encounter (HOSPITAL_COMMUNITY): Payer: Self-pay | Admitting: Emergency Medicine

## 2020-03-15 DIAGNOSIS — R1012 Left upper quadrant pain: Secondary | ICD-10-CM

## 2020-03-15 DIAGNOSIS — F17210 Nicotine dependence, cigarettes, uncomplicated: Secondary | ICD-10-CM | POA: Diagnosis not present

## 2020-03-15 DIAGNOSIS — Z7951 Long term (current) use of inhaled steroids: Secondary | ICD-10-CM | POA: Diagnosis not present

## 2020-03-15 DIAGNOSIS — Z79899 Other long term (current) drug therapy: Secondary | ICD-10-CM | POA: Insufficient documentation

## 2020-03-15 DIAGNOSIS — Z9851 Tubal ligation status: Secondary | ICD-10-CM | POA: Insufficient documentation

## 2020-03-15 DIAGNOSIS — I10 Essential (primary) hypertension: Secondary | ICD-10-CM | POA: Insufficient documentation

## 2020-03-15 DIAGNOSIS — J449 Chronic obstructive pulmonary disease, unspecified: Secondary | ICD-10-CM | POA: Diagnosis not present

## 2020-03-15 LAB — CBC
HCT: 43.3 % (ref 36.0–46.0)
Hemoglobin: 14.3 g/dL (ref 12.0–15.0)
MCH: 31.4 pg (ref 26.0–34.0)
MCHC: 33 g/dL (ref 30.0–36.0)
MCV: 95.2 fL (ref 80.0–100.0)
Platelets: 289 10*3/uL (ref 150–400)
RBC: 4.55 MIL/uL (ref 3.87–5.11)
RDW: 11.7 % (ref 11.5–15.5)
WBC: 12.2 10*3/uL — ABNORMAL HIGH (ref 4.0–10.5)
nRBC: 0 % (ref 0.0–0.2)

## 2020-03-15 LAB — I-STAT BETA HCG BLOOD, ED (NOT ORDERABLE): I-stat hCG, quantitative: 9.1 m[IU]/mL — ABNORMAL HIGH (ref <5)

## 2020-03-15 NOTE — ED Triage Notes (Signed)
Pt reports L sided chest pain that radiated from her epigastric region under her L breast. It started about 30 mins ago while she was sitting down. She denies any aggravating or relieving factors. States that the pain is "sharp and dull like someone is hitting her." 9/10. Denies nausea or SOB. Denies cardiac hx. Does smoke cigarettes.

## 2020-03-16 ENCOUNTER — Other Ambulatory Visit: Payer: Self-pay

## 2020-03-16 DIAGNOSIS — R1012 Left upper quadrant pain: Secondary | ICD-10-CM | POA: Diagnosis not present

## 2020-03-16 LAB — TROPONIN I (HIGH SENSITIVITY)
Troponin I (High Sensitivity): 3 ng/L (ref <18)
Troponin I (High Sensitivity): 3 ng/L (ref <18)

## 2020-03-16 LAB — BASIC METABOLIC PANEL
Anion gap: 16 — ABNORMAL HIGH (ref 5–15)
BUN: 19 mg/dL (ref 6–20)
CO2: 27 mmol/L (ref 22–32)
Calcium: 9.7 mg/dL (ref 8.9–10.3)
Chloride: 102 mmol/L (ref 98–111)
Creatinine, Ser: 0.83 mg/dL (ref 0.44–1.00)
GFR, Estimated: >60 mL/min (ref >60)
Glucose, Bld: 116 mg/dL — ABNORMAL HIGH (ref 70–99)
Potassium: 3.7 mmol/L (ref 3.5–5.1)
Sodium: 145 mmol/L (ref 135–145)

## 2020-03-16 LAB — HEPATIC FUNCTION PANEL
ALT: 17 U/L (ref 0–44)
AST: 20 U/L (ref 15–41)
Albumin: 4.3 g/dL (ref 3.5–5.0)
Alkaline Phosphatase: 83 U/L (ref 38–126)
Bilirubin, Direct: 0.1 mg/dL (ref 0.0–0.2)
Indirect Bilirubin: 0.5 mg/dL (ref 0.3–0.9)
Total Bilirubin: 0.6 mg/dL (ref 0.3–1.2)
Total Protein: 7.4 g/dL (ref 6.5–8.1)

## 2020-03-16 LAB — LIPASE, BLOOD: Lipase: 38 U/L (ref 11–51)

## 2020-03-16 MED ORDER — SUCRALFATE 1 G PO TABS: 1.0000 g | ORAL_TABLET | Freq: Three times a day (TID) | ORAL | 0 refills | Status: DC

## 2020-03-16 MED ORDER — SUCRALFATE 1 GM/10ML PO SUSP
1.0000 g | Freq: Once | ORAL | Status: AC
  Administered 2020-03-16: 1 g via ORAL
  Filled 2020-03-16: qty 10

## 2020-03-16 MED ORDER — OMEPRAZOLE 20 MG PO CPDR: DELAYED_RELEASE_CAPSULE | ORAL | 0 refills | Status: DC

## 2020-03-16 MED ORDER — PANTOPRAZOLE SODIUM 40 MG IV SOLR
40.0000 mg | Freq: Once | INTRAVENOUS | Status: DC
  Filled 2020-03-16: qty 40

## 2020-03-16 NOTE — ED Provider Notes (Signed)
[facility]-EMERGENCY DEPT Provider Note: Lowella Dell, MD, FACEP  CSN: [id number] MRN: [id number] ARRIVAL: 03/15/20 at 2308 ROOM: RESA/RESA   CHIEF COMPLAINT  Abdominal Pain   HISTORY OF PRESENT ILLNESS  03/16/20 12:22 AM Ashley Hodge is a 59 y.o. female who developed left upper quadrant abdominal pain about 1 hour ago.  The onset was sudden.  The pain has both sharp and dull components.  It is worse with palpation of the left upper quadrant.  She has no associated shortness of breath, nausea, vomiting or diaphoresis.  She rates the pain as a 9 out of 10.  She denies any leg pain or swelling.   Past Medical History:  Diagnosis Date  . Anxiety   . Asthma   . COPD (chronic obstructive pulmonary disease) (HCC)   . High cholesterol   . Hypertension     Past Surgical History:  Procedure Laterality Date  . TUBAL LIGATION      Family History  Problem Relation Age of Onset  . Heart disease Mother   . Hypertension Mother     Social History   Tobacco Use  . Smoking status: Current Every Day Smoker    Packs/day: 0.50    Types: Cigarettes  . Smokeless tobacco: Never Used  . Tobacco comment: 1/2 pack a day-12/25/19  Vaping Use  . Vaping Use: Never used  Substance Use Topics  . Alcohol use: Not Currently  . Drug use: Never    Prior to Admission medications   Medication Sig Start Date End Date Taking? Authorizing Provider  albuterol (PROVENTIL HFA;VENTOLIN HFA) 108 (90 Base) MCG/ACT inhaler Inhale 1-2 puffs into the lungs every 6 (six) hours as needed for shortness of breath or wheezing. 08/02/18   [provider]  amLODipine (NORVASC) 5 MG tablet Take 5 mg by mouth daily. 09/07/17   [provider]  cephALEXin (KEFLEX) 500 MG capsule Take 1 capsule (500 mg total) by mouth 3 (three) times daily for 7 days. 03/09/20 03/16/20  Robinson, [place] N, PA-C  Fluticasone-Umeclidin-Vilant (TRELEGY ELLIPTA) 100-62.5-25 MCG/INH AEPB Inhale 1 puff into the lungs daily. 12/25/19    Hunsucker, Lesia Sago, MD  hydrochlorothiazide (HYDRODIURIL) 12.5 MG tablet Take 12.5 mg by mouth daily. 09/07/17   [provider]  losartan (COZAAR) 50 MG tablet Take 50 mg by mouth daily. 09/07/17   [provider]  omeprazole (PRILOSEC) 20 MG capsule Take 1 tablet every morning at least 30 min before first dose of Carafate. 03/16/20   Kory Panjwani, MD  pravastatin (PRAVACHOL) 80 MG tablet Take 80 mg by mouth daily. 05/21/19   [provider]  sucralfate (CARAFATE) 1 g tablet Take 1 tablet (1 g total) by mouth 4 (four) times daily -  with meals and at bedtime. 03/16/20   Marna Weniger, MD  fluticasone (FLONASE) 50 MCG/ACT nasal spray Place 2 sprays into both nostrils daily. Patient not taking: Reported on 12/10/2019 11/04/19 01/31/20  Myra Rude, MD  ipratropium-albuterol (DUONEB) 0.5-2.5 (3) MG/3ML SOLN Take 3 mLs by nebulization every 6 (six) hours as needed (Wheezing and SOB). Patient not taking: Reported on 12/25/2019 12/11/19 01/31/20  Marguerita Merles Latif, DO    Allergies Lisinopril and Lisinopril   REVIEW OF SYSTEMS  Negative except as noted here or in the History of Present Illness.   PHYSICAL EXAMINATION  Initial Vital Signs Blood pressure (!) 169/79, pulse 99, temperature 98 F (36.7 C), temperature source Oral, resp. rate 15, height 5' (1.524 m), weight 74.8 kg, SpO2 96 %.  Examination General: Well-developed, well-nourished female in no acute distress; appearance consistent with age of record HENT: normocephalic; atraumatic Eyes: pupils equal, round and reactive to light; extraocular muscles intact Neck: supple Heart: regular rate and rhythm Lungs: clear to auscultation bilaterally Abdomen: soft; nondistended; left upper quadrant tenderness; no masses or hepatosplenomegaly; bowel sounds present Extremities: No deformity; full range of motion; pulses normal Neurologic: Awake, alert and oriented; motor function intact in all extremities and  symmetric; no facial droop Skin: Warm and dry Psychiatric: Normal mood and affect   RESULTS  Summary of this visit's results, reviewed and interpreted by myself:   EKG Interpretation  Date/Time:  Sunday March 15 2020 23:18:12 EDT Ventricular Rate:  91 PR Interval:    QRS Duration: 90 QT Interval:  371 QTC Calculation: 457 R Axis:   16 Text Interpretation: Sinus rhythm No significant change was found Confirmed by Dequon Schnebly ([id number]) on 03/15/2020 11:26:19 PM      Laboratory Studies: Results for orders placed or performed during the hospital encounter of 03/15/20 (from the past 24 hour(s))  Basic metabolic panel     Status: Abnormal   Collection Time: 03/15/20 11:26 PM  Result Value Ref Range   Sodium 145 135 - 145 mmol/L   Potassium 3.7 3.5 - 5.1 mmol/L   Chloride 102 98 - 111 mmol/L   CO2 27 22 - 32 mmol/L   Glucose, Bld 116 (H) 70 - 99 mg/dL   BUN 19 6 - 20 mg/dL   Creatinine, Ser 0.08 0.44 - 1.00 mg/dL   Calcium 9.7 8.9 - 67.6 mg/dL   GFR, Estimated >19 >50 mL/min   Anion gap 16 (H) 5 - 15  CBC     Status: Abnormal   Collection Time: 03/15/20 11:26 PM  Result Value Ref Range   WBC 12.2 (H) 4.0 - 10.5 K/uL   RBC 4.55 3.87 - 5.11 MIL/uL   Hemoglobin 14.3 12.0 - 15.0 g/dL   HCT 93.2 36 - 46 %   MCV 95.2 80.0 - 100.0 fL   MCH 31.4 26.0 - 34.0 pg   MCHC 33.0 30.0 - 36.0 g/dL   RDW 67.1 24.5 - 80.9 %   Platelets 289 150 - 400 K/uL   nRBC 0.0 0.0 - 0.2 %  Troponin I (High Sensitivity)     Status: None   Collection Time: 03/15/20 11:26 PM  Result Value Ref Range   Troponin I (High Sensitivity) 3 <18 ng/L  Hepatic function panel     Status: None   Collection Time: 03/15/20 11:26 PM  Result Value Ref Range   Total Protein 7.4 6.5 - 8.1 g/dL   Albumin 4.3 3.5 - 5.0 g/dL   AST 20 15 - 41 U/L   ALT 17 0 - 44 U/L   Alkaline Phosphatase 83 38 - 126 U/L   Total Bilirubin 0.6 0.3 - 1.2 mg/dL   Bilirubin, Direct 0.1 0.0 - 0.2 mg/dL   Indirect Bilirubin 0.5 0.3 - 0.9  mg/dL  Lipase, blood     Status: None   Collection Time: 03/15/20 11:26 PM  Result Value Ref Range   Lipase 38 11 - 51 U/L  I-Stat beta hCG blood, ED     Status: Abnormal   Collection Time: 03/15/20 11:32 PM  Result Value Ref Range   I-stat hCG, quantitative 9.1 (H) <5 mIU/mL   Comment 3          Troponin I (High Sensitivity)     Status: None  Collection Time: 03/16/20  1:24 AM  Result Value Ref Range   Troponin I (High Sensitivity) 3 <18 ng/L   Imaging Studies: DG Chest 2 View  Result Date: 03/15/2020 CLINICAL DATA:  Left-sided chest pain. EXAM: CHEST - 2 VIEW COMPARISON:  December 23, 2019 FINDINGS: The heart size and mediastinal contours are within normal limits. Both lungs are clear. The visualized skeletal structures are unremarkable. IMPRESSION: No active cardiopulmonary disease. Electronically Signed   By: Aram Candela M.D.   On: 03/15/2020 23:42    ED COURSE and MDM  Nursing notes, initial and subsequent vitals signs, including pulse oximetry, reviewed and interpreted by myself.  Vitals:   03/15/20 2315 03/16/20 0033 03/16/20 0100 03/16/20 0130  BP: (!) 169/79 (!) 163/91 116/76 124/72  Pulse: 99 (!) 103 83 81  Resp: 15 18 16 18   Temp: 98 F (36.7 C) 98.6 F (37 C)    TempSrc: Oral Oral    SpO2: 96% 97% 96% 98%  Weight: 74.8 kg     Height: 5' (1.524 m)      Medications  pantoprazole (PROTONIX) injection 40 mg (40 mg Intravenous Not Given 03/16/20 0130)  sucralfate (CARAFATE) 1 GM/10ML suspension 1 g (1 g Oral Given 03/16/20 0127)   2:36 AM Patient got significant relief with oral Carafate.  She refused IV Protonix.  I suspect her pain is due to gastritis.  We will start her on appropriate medications.   PROCEDURES  Procedures   ED DIAGNOSES     ICD-10-CM   1. Left upper quadrant abdominal pain  R10.12        13/1/21, MD 03/16/20 [phone]

## 2020-03-21 ENCOUNTER — Encounter (HOSPITAL_COMMUNITY): Payer: Self-pay | Admitting: Emergency Medicine

## 2020-03-21 ENCOUNTER — Emergency Department (HOSPITAL_COMMUNITY)
Admission: EM | Admit: 2020-03-21 | Discharge: 2020-03-21 | Disposition: A | Discharge status: Home / Self Care | Payer: Medicare Other | Attending: Emergency Medicine | Admitting: Emergency Medicine

## 2020-03-21 ENCOUNTER — Emergency Department (HOSPITAL_COMMUNITY): Payer: Medicare Other

## 2020-03-21 ENCOUNTER — Other Ambulatory Visit: Payer: Self-pay

## 2020-03-21 DIAGNOSIS — Z79899 Other long term (current) drug therapy: Secondary | ICD-10-CM | POA: Insufficient documentation

## 2020-03-21 DIAGNOSIS — J449 Chronic obstructive pulmonary disease, unspecified: Secondary | ICD-10-CM | POA: Diagnosis not present

## 2020-03-21 DIAGNOSIS — M5412 Radiculopathy, cervical region: Secondary | ICD-10-CM | POA: Insufficient documentation

## 2020-03-21 DIAGNOSIS — F17210 Nicotine dependence, cigarettes, uncomplicated: Secondary | ICD-10-CM | POA: Diagnosis not present

## 2020-03-21 DIAGNOSIS — Z7951 Long term (current) use of inhaled steroids: Secondary | ICD-10-CM | POA: Insufficient documentation

## 2020-03-21 DIAGNOSIS — I10 Essential (primary) hypertension: Secondary | ICD-10-CM | POA: Insufficient documentation

## 2020-03-21 DIAGNOSIS — M542 Cervicalgia: Secondary | ICD-10-CM | POA: Diagnosis present

## 2020-03-21 LAB — CBC
HCT: 41.9 % (ref 36.0–46.0)
Hemoglobin: 13.9 g/dL (ref 12.0–15.0)
MCH: 31.4 pg (ref 26.0–34.0)
MCHC: 33.2 g/dL (ref 30.0–36.0)
MCV: 94.6 fL (ref 80.0–100.0)
Platelets: 295 10*3/uL (ref 150–400)
RBC: 4.43 MIL/uL (ref 3.87–5.11)
RDW: 11.8 % (ref 11.5–15.5)
WBC: 11.9 10*3/uL — ABNORMAL HIGH (ref 4.0–10.5)
nRBC: 0 % (ref 0.0–0.2)

## 2020-03-21 LAB — BASIC METABOLIC PANEL
Anion gap: 13 (ref 5–15)
BUN: 12 mg/dL (ref 6–20)
CO2: 23 mmol/L (ref 22–32)
Calcium: 9.1 mg/dL (ref 8.9–10.3)
Chloride: 103 mmol/L (ref 98–111)
Creatinine, Ser: 0.79 mg/dL (ref 0.44–1.00)
GFR, Estimated: >60 mL/min (ref >60)
Glucose, Bld: 121 mg/dL — ABNORMAL HIGH (ref 70–99)
Potassium: 3.7 mmol/L (ref 3.5–5.1)
Sodium: 139 mmol/L (ref 135–145)

## 2020-03-21 LAB — TROPONIN I (HIGH SENSITIVITY): Troponin I (High Sensitivity): 3 ng/L (ref <18)

## 2020-03-21 MED ORDER — KETOROLAC TROMETHAMINE 30 MG/ML IJ SOLN
30.0000 mg | Freq: Once | INTRAMUSCULAR | Status: AC
  Administered 2020-03-21: 30 mg via INTRAVENOUS
  Filled 2020-03-21: qty 1

## 2020-03-21 MED ORDER — IBUPROFEN 600 MG PO TABS: 600.0000 mg | ORAL_TABLET | Freq: Four times a day (QID) | ORAL | 0 refills | Status: DC | PRN

## 2020-03-21 NOTE — ED Notes (Signed)
Pt provided with a bus pass and water on discharge. Pt ambulatory with a steady gait.

## 2020-03-21 NOTE — Discharge Instructions (Signed)
Your pain is likely due to a muscle or nerve irritation.  Take ibuprofen as needed for pain.  Continue to take muscle relaxant as previously prescribed.  Follow-up closely with your doctor for further care.  Return if you have any concern

## 2020-03-21 NOTE — ED Triage Notes (Signed)
Patient seen for similar 10/31. Complaining of neck and arm pain. States medications are not working. Last muscle relaxer taken x2 hours ago.

## 2020-03-21 NOTE — ED Provider Notes (Signed)
[facility]-EMERGENCY DEPT Provider Note   CSN: [id number] Arrival date & time: 03/21/20  0132     History Chief Complaint  Patient presents with  . Neck Pain    Ashley Hodge is a 59 y.o. female.  The history is provided by the patient and medical records. No language interpreter was used.  Neck Pain    59 year old female significant she of hypertension, hypercholesterolemia, COPD, asthma, anxiety, presenting complaining of neck pain.  Patient report for the past 2 days she has had intermittent pain to the left side of her neck radiates to her left shoulder and now radiates towards her chest.  Pain is sharp shooting happens sporadically, not exertional, has become increasingly more tender prompting this ER visit.  She does endorse some mild nausea with her symptom.  She does not complain of any fever or chills no runny nose sneezing or coughing no shortness of breath lightheadedness dizziness diaphoresis abdominal pain or back pain.  She denies any recent strenuous activity.  She is a smoker but currently actively trying to quit.  She does report family history of heart disease.  She mention been seen by her PCP for her complaint several days ago and it was thought that she may have tension headache.  Patient was prescribed muscle relaxant which she has been taking with minimal relief.  She denies history of prior PE.  Patient has been fully vaccinated for COVID-19. Past Medical History:  Diagnosis Date  . Anxiety   . Asthma   . COPD (chronic obstructive pulmonary disease) (HCC)   . High cholesterol   . Hypertension     Patient Active Problem List   Diagnosis Date Noted  . COPD with chronic bronchitis and emphysema (HCC) 12/11/2019  . Pre-diabetes 12/11/2019  . Hypokalemia 12/10/2019  . Essential hypertension 12/10/2019  . Hyperlipidemia 12/10/2019    Past Surgical History:  Procedure Laterality Date  . TUBAL LIGATION       OB History    Gravida       Para      Term      Preterm      AB      Living  5     SAB      TAB      Ectopic      Multiple      Live Births              Family History  Problem Relation Age of Onset  . Heart disease Mother   . Hypertension Mother     Social History   Tobacco Use  . Smoking status: Current Every Day Smoker    Packs/day: 0.50    Types: Cigarettes  . Smokeless tobacco: Never Used  . Tobacco comment: 1/2 pack a day-12/25/19  Vaping Use  . Vaping Use: Never used  Substance Use Topics  . Alcohol use: Not Currently  . Drug use: Never    Home Medications Prior to Admission medications   Medication Sig Start Date End Date Taking? Authorizing Provider  albuterol (PROVENTIL HFA;VENTOLIN HFA) 108 (90 Base) MCG/ACT inhaler Inhale 1-2 puffs into the lungs every 6 (six) hours as needed for shortness of breath or wheezing. 08/02/18   [provider]  amLODipine (NORVASC) 5 MG tablet Take 5 mg by mouth daily. 09/07/17   [provider]  Fluticasone-Umeclidin-Vilant (TRELEGY ELLIPTA) 100-62.5-25 MCG/INH AEPB Inhale 1 puff into the lungs daily. 12/25/19   Hunsucker, Lesia Sago, MD  hydrochlorothiazide (HYDRODIURIL)  12.5 MG tablet Take 12.5 mg by mouth daily. 09/07/17   [provider]  losartan (COZAAR) 50 MG tablet Take 50 mg by mouth daily. 09/07/17   [provider]  omeprazole (PRILOSEC) 20 MG capsule Take 1 tablet every morning at least 30 min before first dose of Carafate. 03/16/20   Molpus, John, MD  pravastatin (PRAVACHOL) 80 MG tablet Take 80 mg by mouth daily. 05/21/19   [provider]  sucralfate (CARAFATE) 1 g tablet Take 1 tablet (1 g total) by mouth 4 (four) times daily -  with meals and at bedtime. 03/16/20   Molpus, John, MD  fluticasone (FLONASE) 50 MCG/ACT nasal spray Place 2 sprays into both nostrils daily. Patient not taking: Reported on 12/10/2019 11/04/19 01/31/20  Myra Rude, MD  ipratropium-albuterol (DUONEB) 0.5-2.5 (3)  MG/3ML SOLN Take 3 mLs by nebulization every 6 (six) hours as needed (Wheezing and SOB). Patient not taking: Reported on 12/25/2019 12/11/19 01/31/20  Marguerita Merles Latif, DO    Allergies    Lisinopril and Lisinopril  Review of Systems   Review of Systems  Musculoskeletal: Positive for neck pain.  All other systems reviewed and are negative.   Physical Exam Updated Vital Signs BP 116/71   Pulse 89   Temp 98.8 F (37.1 C) (Oral)   Resp 14   SpO2 95%   Physical Exam Vitals and nursing note reviewed.  Constitutional:      General: She is not in acute distress.    Appearance: She is well-developed.  HENT:     Head: Atraumatic.  Eyes:     Conjunctiva/sclera: Conjunctivae normal.  Neck:     Vascular: No carotid bruit.  Cardiovascular:     Rate and Rhythm: Normal rate and regular rhythm.     Pulses: Normal pulses.     Heart sounds: Normal heart sounds.  Pulmonary:     Effort: Pulmonary effort is normal.     Breath sounds: Normal breath sounds.  Abdominal:     General: Abdomen is flat.     Palpations: Abdomen is soft.     Tenderness: There is no abdominal tenderness.  Musculoskeletal:     Cervical back: Normal range of motion and neck supple. No rigidity or tenderness.     Right lower leg: No edema.     Left lower leg: No edema.  Lymphadenopathy:     Cervical: No cervical adenopathy.  Skin:    Capillary Refill: Capillary refill takes less than 2 seconds.     Findings: No rash.  Neurological:     Mental Status: She is alert and oriented to person, place, and time.  Psychiatric:        Mood and Affect: Mood normal.     ED Results / Procedures / Treatments   Labs (all labs ordered are listed, but only abnormal results are displayed) Labs Reviewed  BASIC METABOLIC PANEL - Abnormal; Notable for the following components:      Result Value   Glucose, Bld 121 (*)    All other components within normal limits  CBC - Abnormal; Notable for the following components:   WBC  11.9 (*)    All other components within normal limits  TROPONIN I (HIGH SENSITIVITY)  TROPONIN I (HIGH SENSITIVITY)    EKG None   Date: 03/21/2020  Rate: 89  Rhythm: normal sinus rhythm  QRS Axis: normal  Intervals: normal  ST/T Wave abnormalities: normal  Conduction Disutrbances: none  Narrative Interpretation:   Old EKG  Reviewed: No significant changes noted     Radiology DG Chest Port 1 View  Result Date: 03/21/2020 CLINICAL DATA:  Chest pain EXAM: PORTABLE CHEST 1 VIEW COMPARISON:  03/15/2020 FINDINGS: The heart size and mediastinal contours are within normal limits. Both lungs are clear. The visualized skeletal structures are unremarkable. IMPRESSION: No active disease. Electronically Signed   By: Signa Kell M.D.   On: 03/21/2020 04:52    Procedures Procedures (including critical care time)  Medications Ordered in ED Medications  ketorolac (TORADOL) 30 MG/ML injection 30 mg (30 mg Intravenous Given 03/21/20 0413)    ED Course  I have reviewed the triage vital signs and the nursing notes.  Pertinent labs & imaging results that were available during my care of the patient were reviewed by me and considered in my medical decision making (see chart for details).    MDM Rules/Calculators/A&P                          BP 116/71   Pulse 89   Temp 98.8 F (37.1 C) (Oral)   Resp 14   SpO2 95%   Final Clinical Impression(s) / ED Diagnoses Final diagnoses:  Cervical radiculopathy    Rx / DC Orders ED Discharge Orders    None     3:57 AM Patient with complaints of left-sided neck pain radiates to left shoulder and left chest.  Due to patient having several risk factors for CAD will perform work-up although I suspect this is likely muscle skeletal pain.  5:18 AM EKG without concerning arrhythmia or acute ischemic changes.  Normal QTC.  Normal troponin, heart score is 4, low/moderate risk of MACE. With pain ongoing for 2 days, and trop is normal.  Doubt  ACS  5:42 AM Pt report moderate improvement of sxs after toradol.  Suspect MSK pain.  Will d/c home with sxs treatment and outpt f/u.  Return precaution given.  Doubt acute emergent medical condition causing pt's discomfort.  Her vital signs are stable.    Fayrene Helper, PA-C 03/21/20 [id number]    Shon Baton, MD 03/22/20 [phone]

## 2020-03-21 NOTE — ED Notes (Signed)
[place], PA at bedside 

## 2020-03-25 ENCOUNTER — Ambulatory Visit: Payer: Medicare Other

## 2020-04-03 ENCOUNTER — Emergency Department (HOSPITAL_COMMUNITY)
Admission: EM | Admit: 2020-04-03 | Discharge: 2020-04-04 | Disposition: A | Discharge status: Home / Self Care | Payer: Medicare Other | Attending: Emergency Medicine | Admitting: Emergency Medicine

## 2020-04-03 ENCOUNTER — Emergency Department (HOSPITAL_COMMUNITY): Payer: Medicare Other

## 2020-04-03 ENCOUNTER — Other Ambulatory Visit: Payer: Self-pay

## 2020-04-03 ENCOUNTER — Encounter (HOSPITAL_COMMUNITY): Payer: Self-pay | Admitting: Emergency Medicine

## 2020-04-03 DIAGNOSIS — Z79899 Other long term (current) drug therapy: Secondary | ICD-10-CM | POA: Insufficient documentation

## 2020-04-03 DIAGNOSIS — F17210 Nicotine dependence, cigarettes, uncomplicated: Secondary | ICD-10-CM | POA: Diagnosis not present

## 2020-04-03 DIAGNOSIS — R0789 Other chest pain: Secondary | ICD-10-CM | POA: Diagnosis present

## 2020-04-03 DIAGNOSIS — J449 Chronic obstructive pulmonary disease, unspecified: Secondary | ICD-10-CM | POA: Diagnosis not present

## 2020-04-03 DIAGNOSIS — I10 Essential (primary) hypertension: Secondary | ICD-10-CM | POA: Diagnosis not present

## 2020-04-03 DIAGNOSIS — R079 Chest pain, unspecified: Secondary | ICD-10-CM

## 2020-04-03 LAB — CBC
HCT: 42.6 % (ref 36.0–46.0)
Hemoglobin: 13.7 g/dL (ref 12.0–15.0)
MCH: 30.3 pg (ref 26.0–34.0)
MCHC: 32.2 g/dL (ref 30.0–36.0)
MCV: 94.2 fL (ref 80.0–100.0)
Platelets: 310 10*3/uL (ref 150–400)
RBC: 4.52 MIL/uL (ref 3.87–5.11)
RDW: 11.3 % — ABNORMAL LOW (ref 11.5–15.5)
WBC: 9.5 10*3/uL (ref 4.0–10.5)
nRBC: 0 % (ref 0.0–0.2)

## 2020-04-03 LAB — BASIC METABOLIC PANEL
Anion gap: 13 (ref 5–15)
BUN: 9 mg/dL (ref 6–20)
CO2: 23 mmol/L (ref 22–32)
Calcium: 9.3 mg/dL (ref 8.9–10.3)
Chloride: 105 mmol/L (ref 98–111)
Creatinine, Ser: 0.83 mg/dL (ref 0.44–1.00)
GFR, Estimated: >60 mL/min (ref >60)
Glucose, Bld: 168 mg/dL — ABNORMAL HIGH (ref 70–99)
Potassium: 3.6 mmol/L (ref 3.5–5.1)
Sodium: 141 mmol/L (ref 135–145)

## 2020-04-03 LAB — TROPONIN I (HIGH SENSITIVITY): Troponin I (High Sensitivity): 3 ng/L (ref <18)

## 2020-04-03 NOTE — ED Triage Notes (Addendum)
Pt presents to ED BIB [facility]. Pt c/o L CP that began 20m ago. Pt anxious w/ EMS HR - 120, 162/82. 98% - RA. EMS given 324 ASA, nitro x1. 20 RAC. Pain relieved by nitro

## 2020-04-04 DIAGNOSIS — R0789 Other chest pain: Secondary | ICD-10-CM | POA: Diagnosis not present

## 2020-04-04 LAB — I-STAT BETA HCG BLOOD, ED (MC, WL, AP ONLY): I-stat hCG, quantitative: 8.8 m[IU]/mL — ABNORMAL HIGH (ref <5)

## 2020-04-04 LAB — TROPONIN I (HIGH SENSITIVITY): Troponin I (High Sensitivity): 4 ng/L (ref <18)

## 2020-04-04 NOTE — Discharge Instructions (Signed)

## 2020-04-04 NOTE — ED Provider Notes (Signed)
[facility] EMERGENCY DEPARTMENT Provider Note   CSN: [id number] Arrival date & time: 04/03/20  2123     History Chief Complaint  Patient presents with  . Chest Pain    Ashley Hodge is a 59 y.o. female who presents with a cc of chest pain. The patient has multiple ED visits. CP  Began yesterday when she was screaming at her grandchild. She had sharp left sided chest pain waxing and waning. EMS came out yesterday and gave her ASA and NTG which helped. She denies sob, nausea, diaphoresis. Pain does not radiate. She is currently pain free.  HPI  HPI: A 59 year old patient with a history of hypertension, hypercholesterolemia and obesity presents for evaluation of chest pain. Initial onset of pain was more than 6 hours ago. The patient's chest pain is sharp, is not worse with exertion and is relieved by nitroglycerin. The patient's chest pain is middle- or left-sided, is not well-localized, is not described as heaviness/pressure/tightness and does not radiate to the arms/jaw/neck. The patient does not complain of nausea and denies diaphoresis. The patient has smoked in the past 90 days. The patient has no history of stroke, has no history of peripheral artery disease, denies any history of treated diabetes and has no relevant family history of coronary artery disease (first degree relative at less than age 22).   Past Medical History:  Diagnosis Date  . Anxiety   . Asthma   . COPD (chronic obstructive pulmonary disease) (HCC)   . High cholesterol   . Hypertension     Patient Active Problem List   Diagnosis Date Noted  . COPD with chronic bronchitis and emphysema (HCC) 12/11/2019  . Pre-diabetes 12/11/2019  . Hypokalemia 12/10/2019  . Essential hypertension 12/10/2019  . Hyperlipidemia 12/10/2019    Past Surgical History:  Procedure Laterality Date  . TUBAL LIGATION       OB History    Gravida      Para      Term      Preterm      AB      Living  5      SAB      TAB      Ectopic      Multiple      Live Births              Family History  Problem Relation Age of Onset  . Heart disease Mother   . Hypertension Mother     Social History   Tobacco Use  . Smoking status: Current Every Day Smoker    Packs/day: 0.50    Types: Cigarettes  . Smokeless tobacco: Never Used  . Tobacco comment: 1/2 pack a day-12/25/19  Vaping Use  . Vaping Use: Never used  Substance Use Topics  . Alcohol use: Not Currently  . Drug use: Never    Home Medications Prior to Admission medications   Medication Sig Start Date End Date Taking? Authorizing Provider  amLODipine (NORVASC) 5 MG tablet Take 5 mg by mouth daily. 09/07/17  Yes [provider]  Fluticasone-Umeclidin-Vilant (TRELEGY ELLIPTA) 100-62.5-25 MCG/INH AEPB Inhale 1 puff into the lungs daily. 12/25/19  Yes Hunsucker, Lesia Sago, MD  hydrochlorothiazide (HYDRODIURIL) 12.5 MG tablet Take 12.5 mg by mouth daily. 09/07/17  Yes [provider]  losartan (COZAAR) 50 MG tablet Take 50 mg by mouth daily. 09/07/17  Yes [provider]  methocarbamol (ROBAXIN) 750 MG tablet Take 750 mg by mouth 3 (three) times  daily as needed. 03/19/20  Yes [provider]  pravastatin (PRAVACHOL) 80 MG tablet Take 80 mg by mouth daily. 05/21/19  Yes [provider]  ibuprofen (ADVIL) 600 MG tablet Take 1 tablet (600 mg total) by mouth every 6 (six) hours as needed. Patient not taking: Reported on 04/04/2020 03/21/20   Fayrene Helper, PA-C  sucralfate (CARAFATE) 1 g tablet Take 1 tablet (1 g total) by mouth 4 (four) times daily -  with meals and at bedtime. Patient not taking: Reported on 04/04/2020 03/16/20   Molpus, John, MD  fluticasone [facility]) 50 MCG/ACT nasal spray Place 2 sprays into both nostrils daily. Patient not taking: Reported on 12/10/2019 11/04/19 01/31/20  Myra Rude, MD  ipratropium-albuterol (DUONEB) 0.5-2.5 (3) MG/3ML SOLN Take 3 mLs by nebulization  every 6 (six) hours as needed (Wheezing and SOB). Patient not taking: Reported on 12/25/2019 12/11/19 01/31/20  Marguerita Merles Latif, DO    Allergies    Lisinopril  Review of Systems   Review of Systems  Constitutional: Negative for chills and fever.  HENT: Negative.   Eyes: Negative.   Respiratory: Negative for shortness of breath.   Cardiovascular: Positive for chest pain.  Gastrointestinal: Negative.  Negative for nausea and vomiting.  Genitourinary: Negative.   Musculoskeletal: Negative.   Skin: Negative.   Neurological: Negative.     Physical Exam Updated Vital Signs BP 119/69 (BP Location: Right Arm)   Pulse 88   Temp 98 F (36.7 C) (Oral)   Resp 16   Ht 5' (1.524 m)   Wt 74.8 kg   SpO2 98%   BMI 32.21 kg/m   Physical Exam Vitals and nursing note reviewed.  Constitutional:      General: She is not in acute distress.    Appearance: She is well-developed. She is not diaphoretic.  HENT:     Head: Normocephalic and atraumatic.  Eyes:     General: No scleral icterus.    Extraocular Movements: Extraocular movements intact.     Conjunctiva/sclera: Conjunctivae normal.     Pupils: Pupils are equal, round, and reactive to light.  Cardiovascular:     Rate and Rhythm: Normal rate and regular rhythm.     Heart sounds: Normal heart sounds. No murmur heard.  No friction rub. No gallop.   Pulmonary:     Effort: Pulmonary effort is normal. No respiratory distress.     Breath sounds: Wheezing present.  Abdominal:     General: Bowel sounds are normal. There is no distension.     Palpations: Abdomen is soft. There is no mass.     Tenderness: There is no abdominal tenderness. There is no guarding.  Musculoskeletal:     Cervical back: Normal range of motion.  Skin:    General: Skin is warm and dry.  Neurological:     Mental Status: She is alert and oriented to person, place, and time.  Psychiatric:        Behavior: Behavior normal.     ED Results / Procedures /  Treatments   Labs (all labs ordered are listed, but only abnormal results are displayed) Labs Reviewed  BASIC METABOLIC PANEL - Abnormal; Notable for the following components:      Result Value   Glucose, Bld 168 (*)    All other components within normal limits  CBC - Abnormal; Notable for the following components:   RDW 11.3 (*)    All other components within normal limits  I-STAT BETA HCG BLOOD, ED ([facility],  [facility], [facility] ONLY) - Abnormal; Notable for the following components:   I-stat hCG, quantitative 8.8 (*)    All other components within normal limits  TROPONIN I (HIGH SENSITIVITY)  TROPONIN I (HIGH SENSITIVITY)    EKG EKG Interpretation  Date/Time:  Friday April 03 2020 22:16:17 EST Ventricular Rate:  95 PR Interval:  136 QRS Duration: 84 QT Interval:  360 QTC Calculation: 452 R Axis:   48 Text Interpretation: Normal sinus rhythm Low voltage QRS Borderline ECG Confirmed by Cherlynn Perches ([id number]) on 04/04/2020 7:25:32 AM   Radiology DG Chest 2 View  Result Date: 04/03/2020 CLINICAL DATA:  Chest pain EXAM: CHEST - 2 VIEW COMPARISON:  03/21/2020 FINDINGS: The heart size and mediastinal contours are within normal limits. Both lungs are clear. The visualized skeletal structures are unremarkable. IMPRESSION: Normal study. Electronically Signed   By: Charlett Nose M.D.   On: 04/03/2020 22:53    Procedures Procedures (including critical care time)  Medications Ordered in ED Medications - No data to display  ED Course  I have reviewed the triage vital signs and the nursing notes.  Pertinent labs & imaging results that were available during my care of the patient were reviewed by me and considered in my medical decision making (see chart for details).    MDM Rules/Calculators/A&P HEAR Score: 4                        Given the large differential diagnosis for Ashley Hodge, the decision making in this case is of high complexity.  After evaluating all of the data points in this  case, the presentation of Ashley Hodge is NOT consistent with Acute Coronary Syndrome (ACS) and/or myocardial ischemia, pulmonary embolism, aortic dissection; Borhaave's, significant arrythmia, pneumothorax, cardiac tamponade, or other emergent cardiopulmonary condition.  Further, the presentation of Ashley Hodge is NOT consistent with pericarditis, myocarditis, cholecystitis, pancreatitis, mediastinitis, endocarditis, new valvular disease.  Additionally, the presentation of Ashley Hodge NOT consistent with flail chest, cardiac contusion, ARDS, or significant intra-thoracic or intra-abdominal bleeding.  Moreover, this presentation is NOT consistent with pneumonia, sepsis, or pyelonephritis.  The patient has a labs which include troponins (neg x 2) bmp with elevated blood sugar, Cbc is negative for acute abnormality. I stat hcg slightly positive- this is a likely false positive EKG shows NSR at a rate of   Strict return and follow-up precautions have been given by me personally or by detailed written instruction given verbally by nursing staff using the teach back method to the patient/family/caregiver(s).  Data Reviewed/Counseling: I have reviewed the patient's vital signs, nursing notes, and other relevant tests/information. I had a detailed discussion regarding the historical points, exam findings, and any diagnostic results supporting the discharge diagnosis. I also discussed the need for outpatient follow-up and the need to return to the ED if symptoms worsen or if there are any questions or concerns that arise at home.   Final Clinical Impression(s) / ED Diagnoses Final diagnoses:  Nonspecific chest pain    Rx / DC Orders ED Discharge Orders    None       Arthor Captain, PA-C 04/04/20 1301    Sabino Donovan, MD 04/05/20 [phone]

## 2020-04-14 ENCOUNTER — Other Ambulatory Visit: Payer: Self-pay | Admitting: Student

## 2020-04-15 ENCOUNTER — Other Ambulatory Visit: Payer: Self-pay | Admitting: Student

## 2020-04-15 DIAGNOSIS — K8010 Calculus of gallbladder with chronic cholecystitis without obstruction: Secondary | ICD-10-CM

## 2020-04-17 ENCOUNTER — Emergency Department (HOSPITAL_COMMUNITY)
Admission: EM | Admit: 2020-04-17 | Discharge: 2020-04-17 | Disposition: A | Discharge status: Home / Self Care | Payer: Medicare Other | Attending: Emergency Medicine | Admitting: Emergency Medicine

## 2020-04-17 ENCOUNTER — Emergency Department (HOSPITAL_COMMUNITY)
Admission: EM | Admit: 2020-04-17 | Discharge: 2020-04-17 | Disposition: A | Discharge status: Home / Self Care | Payer: Medicare Other | Source: Home / Self Care | Attending: Emergency Medicine | Admitting: Emergency Medicine

## 2020-04-17 ENCOUNTER — Encounter (HOSPITAL_COMMUNITY): Payer: Self-pay | Admitting: Emergency Medicine

## 2020-04-17 ENCOUNTER — Other Ambulatory Visit: Payer: Self-pay

## 2020-04-17 DIAGNOSIS — Z79899 Other long term (current) drug therapy: Secondary | ICD-10-CM | POA: Insufficient documentation

## 2020-04-17 DIAGNOSIS — J45909 Unspecified asthma, uncomplicated: Secondary | ICD-10-CM | POA: Insufficient documentation

## 2020-04-17 DIAGNOSIS — R200 Anesthesia of skin: Secondary | ICD-10-CM | POA: Insufficient documentation

## 2020-04-17 DIAGNOSIS — J449 Chronic obstructive pulmonary disease, unspecified: Secondary | ICD-10-CM | POA: Insufficient documentation

## 2020-04-17 DIAGNOSIS — M79602 Pain in left arm: Secondary | ICD-10-CM

## 2020-04-17 DIAGNOSIS — Z5321 Procedure and treatment not carried out due to patient leaving prior to being seen by health care provider: Secondary | ICD-10-CM | POA: Insufficient documentation

## 2020-04-17 DIAGNOSIS — I10 Essential (primary) hypertension: Secondary | ICD-10-CM | POA: Insufficient documentation

## 2020-04-17 DIAGNOSIS — F17210 Nicotine dependence, cigarettes, uncomplicated: Secondary | ICD-10-CM | POA: Insufficient documentation

## 2020-04-17 LAB — I-STAT CHEM 8, ED
BUN: 11 mg/dL (ref 6–20)
Calcium, Ion: 0.93 mmol/L — ABNORMAL LOW (ref 1.15–1.40)
Chloride: 108 mmol/L (ref 98–111)
Creatinine, Ser: 0.7 mg/dL (ref 0.44–1.00)
Glucose, Bld: 117 mg/dL — ABNORMAL HIGH (ref 70–99)
HCT: 40 % (ref 36.0–46.0)
Hemoglobin: 13.6 g/dL (ref 12.0–15.0)
Potassium: 3.3 mmol/L — ABNORMAL LOW (ref 3.5–5.1)
Sodium: 139 mmol/L (ref 135–145)
TCO2: 22 mmol/L (ref 22–32)

## 2020-04-17 LAB — CBG MONITORING, ED: Glucose-Capillary: 151 mg/dL — ABNORMAL HIGH (ref 70–99)

## 2020-04-17 MED ORDER — PREDNISONE 20 MG PO TABS: 40.0000 mg | ORAL_TABLET | Freq: Every day | ORAL | 0 refills | Status: DC

## 2020-04-17 MED ORDER — PREDNISONE 20 MG PO TABS
60.0000 mg | ORAL_TABLET | ORAL | Status: AC
  Administered 2020-04-17: 60 mg via ORAL
  Filled 2020-04-17: qty 3

## 2020-04-17 NOTE — Discharge Instructions (Signed)
As discussed, your evaluation today has been largely reassuring.  But, it is important that you monitor your condition carefully, and do not hesitate to return to the ED if you develop new, or concerning changes in your condition. ? ?Otherwise, please follow-up with your physician for appropriate ongoing care. ? ?

## 2020-04-17 NOTE — ED Triage Notes (Signed)
Pt reports her hands are "clammy" and she's having pain and numbness in her left arm that started in the hand and moved up the arm. Symptoms began today. Denies N/V, shob, chest pain, changes in vision, and dizziness. Pt A&O x 4 in triage.

## 2020-04-17 NOTE — ED Provider Notes (Signed)
[facility] EMERGENCY DEPARTMENT Provider Note   CSN: [id number] Arrival date & time: 04/17/20  7482     History Chief Complaint  Patient presents with  . Arm Injury    Ashley Hodge is a 59 y.o. female.  HPI Patient presents with concern of left hand numbness.  She notes that over the past day she has had discomfort, described as numbness, flipping sensation in the left forearm, but more in the left dorsum of her hand. No proximal arm changes, no neck pain, no head pain, no chest pain, no dyspnea. Patient went to our affiliated hospital, left prior to being seen earlier today. She notes that in the interim she went out for breakfast, had pancakes. No clear alleviating or exacerbating factors. She may have slept oddly on it 2 nights ago, she notes. She states that she takes all of her medication as directed, smokes cigarettes, does not drink alcohol. No weakness, no discoordination.     Past Medical History:  Diagnosis Date  . Anxiety   . Asthma   . COPD (chronic obstructive pulmonary disease) (HCC)   . High cholesterol   . Hypertension     Patient Active Problem List   Diagnosis Date Noted  . COPD with chronic bronchitis and emphysema (HCC) 12/11/2019  . Pre-diabetes 12/11/2019  . Hypokalemia 12/10/2019  . Essential hypertension 12/10/2019  . Hyperlipidemia 12/10/2019    Past Surgical History:  Procedure Laterality Date  . TUBAL LIGATION       OB History    Gravida      Para      Term      Preterm      AB      Living  5     SAB      TAB      Ectopic      Multiple      Live Births              Family History  Problem Relation Age of Onset  . Heart disease Mother   . Hypertension Mother     Social History   Tobacco Use  . Smoking status: Current Every Day Smoker    Packs/day: 0.50    Types: Cigarettes  . Smokeless tobacco: Never Used  . Tobacco comment: 1/2 pack a day-12/25/19  Vaping Use  . Vaping Use:  Never used  Substance Use Topics  . Alcohol use: Not Currently  . Drug use: Never    Home Medications Prior to Admission medications   Medication Sig Start Date End Date Taking? Authorizing Provider  amLODipine (NORVASC) 5 MG tablet Take 5 mg by mouth daily. 09/07/17   [provider]  Fluticasone-Umeclidin-Vilant (TRELEGY ELLIPTA) 100-62.5-25 MCG/INH AEPB Inhale 1 puff into the lungs daily. 12/25/19   Hunsucker, Lesia Sago, MD  hydrochlorothiazide (HYDRODIURIL) 12.5 MG tablet Take 12.5 mg by mouth daily. 09/07/17   [provider]  ibuprofen (ADVIL) 600 MG tablet Take 1 tablet (600 mg total) by mouth every 6 (six) hours as needed. Patient not taking: Reported on 04/04/2020 03/21/20   Fayrene Helper, PA-C  losartan (COZAAR) 50 MG tablet Take 50 mg by mouth daily. 09/07/17   [provider]  methocarbamol (ROBAXIN) 750 MG tablet Take 750 mg by mouth 3 (three) times daily as needed. 03/19/20   [provider]  pravastatin (PRAVACHOL) 80 MG tablet Take 80 mg by mouth daily. 05/21/19   [provider]  predniSONE (DELTASONE) 20 MG tablet Take  2 tablets (40 mg total) by mouth daily with breakfast. For the next four days 04/17/20   Gerhard Munch, MD  sucralfate (CARAFATE) 1 g tablet Take 1 tablet (1 g total) by mouth 4 (four) times daily -  with meals and at bedtime. Patient not taking: Reported on 04/04/2020 03/16/20   Molpus, John, MD  fluticasone [facility]) 50 MCG/ACT nasal spray Place 2 sprays into both nostrils daily. Patient not taking: Reported on 12/10/2019 11/04/19 01/31/20  Myra Rude, MD  ipratropium-albuterol (DUONEB) 0.5-2.5 (3) MG/3ML SOLN Take 3 mLs by nebulization every 6 (six) hours as needed (Wheezing and SOB). Patient not taking: Reported on 12/25/2019 12/11/19 01/31/20  Marguerita Merles Latif, DO    Allergies    Lisinopril  Review of Systems   Review of Systems  Constitutional:       Per HPI, otherwise negative  HENT:       Per HPI,  otherwise negative  Respiratory:       Per HPI, otherwise negative  Cardiovascular:       Per HPI, otherwise negative  Gastrointestinal: Negative for vomiting.  Endocrine:       Negative aside from HPI  Genitourinary:       Neg aside from HPI   Musculoskeletal:       Per HPI, otherwise negative  Skin: Negative.   Neurological: Positive for numbness. Negative for syncope.    Physical Exam Updated Vital Signs BP 120/64 (BP Location: Right Arm)   Pulse 83   Temp 98.3 F (36.8 C) (Oral)   Resp (!) 23   Ht 5' (1.524 m)   Wt 74.8 kg   SpO2 96%   BMI 32.22 kg/m   Physical Exam Vitals and nursing note reviewed.  Constitutional:      General: She is not in acute distress.    Appearance: She is well-developed.  HENT:     Head: Normocephalic and atraumatic.  Eyes:     Conjunctiva/sclera: Conjunctivae normal.  Neck:   Cardiovascular:     Rate and Rhythm: Normal rate and regular rhythm.  Pulmonary:     Effort: Pulmonary effort is normal. No respiratory distress.     Breath sounds: Normal breath sounds. No stridor.  Abdominal:     General: There is no distension.  Musculoskeletal:     Cervical back: Neck supple. No spinous process tenderness or muscular tenderness.  Skin:    General: Skin is warm and dry.  Neurological:     Mental Status: She is alert and oriented to person, place, and time.     Cranial Nerves: No cranial nerve deficit.     Comments: Unremarkable neurologic exam.  Patient moves her left hand, wrist in all directions appropriately, has appropriate strength, coordination throughout.      ED Results / Procedures / Treatments   Labs (all labs ordered are listed, but only abnormal results are displayed) Labs Reviewed  CBG MONITORING, ED - Abnormal; Notable for the following components:      Result Value   Glucose-Capillary 151 (*)    All other components within normal limits  I-STAT CHEM 8, ED - Abnormal; Notable for the following components:    Potassium 3.3 (*)    Glucose, Bld 117 (*)    Calcium, Ion 0.93 (*)    All other components within normal limits    EKG EKG Interpretation  Date/Time:  Friday April 17 2020 08:42:30 EST Ventricular Rate:  105 PR Interval:    QRS Duration: 102 QT  Interval:  360 QTC Calculation: 476 R Axis:   50 Text Interpretation: Sinus tachycardia Low voltage, precordial leads Borderline T abnormalities, anterior leads No significant change since last tracing Abnormal ECG Confirmed by Gerhard Munch [phone]) on 04/17/2020 10:14:11 AM   Procedures Procedures (including critical care time)  Medications Ordered in ED Medications  predniSONE (DELTASONE) tablet 60 mg (60 mg Oral Given 04/17/20 0945)    ED Course  I have reviewed the triage vital signs and the nursing notes.  Pertinent labs & imaging results that were available during my care of the patient were reviewed by me and considered in my medical decision making (see chart for details).  10:15 AM Chemistry panel unremarkable.  Patient does have mild hyperglycemia, but does take breakfast. On repeat exam patient is awake, alert, in no distress. Discussed findings which are reassuring. Some suspicion for peripheral neuropathy given the patient's isolated symptoms in her distal left arm.  Absent loss of function, objectively abnormal exam, low suspicion for central process.  No evidence for atypical ACS. Patient started on short course of steroids discharged in stable condition. MDM Number of Diagnoses or Management Options Left arm pain: new, needed workup   Amount and/or Complexity of Data Reviewed Clinical lab tests: reviewed Tests in the medicine section of CPT: reviewed Decide to obtain previous medical records or to obtain history from someone other than the patient: yes Review and summarize past medical records: yes  Risk of Complications, Morbidity, and/or Mortality Presenting problems: high Diagnostic procedures:  high Management options: high  Critical Care Total time providing critical care: < 30 minutes  Patient Progress Patient progress: stable  Final Clinical Impression(s) / ED Diagnoses Final diagnoses:  Left arm pain    Rx / DC Orders ED Discharge Orders         Ordered    predniSONE (DELTASONE) 20 MG tablet  Daily with breakfast        04/17/20 0957           Gerhard Munch, MD 04/17/20 1016

## 2020-04-17 NOTE — ED Notes (Signed)
Patient discharged ambulatory to [facility], given bus pass.  Denies any pain at this time  Reviewed medications and DC paperwork, verbalized understanding.

## 2020-04-17 NOTE — ED Notes (Signed)
Patient was brought in EMS d/t L arm pain. Patient denies any trauma at this time. Denies any numbness right now, but states she had it yesterday. Has been going on for 2 days now.  Neuro intact.   BP 120/64 (BP Location: Right Arm)    Pulse 83    Temp 98.3 F (36.8 C) (Oral)    Resp (!) 23    Ht 5' (1.524 m)    Wt 74.8 kg    SpO2 96%    BMI 32.22 kg/m

## 2020-04-20 ENCOUNTER — Other Ambulatory Visit: Payer: Self-pay

## 2020-04-20 ENCOUNTER — Ambulatory Visit (INDEPENDENT_AMBULATORY_CARE_PROVIDER_SITE_OTHER): Payer: Medicare Other | Admitting: Pulmonary Disease

## 2020-04-20 DIAGNOSIS — J449 Chronic obstructive pulmonary disease, unspecified: Secondary | ICD-10-CM | POA: Diagnosis not present

## 2020-04-20 LAB — PULMONARY FUNCTION TEST
DL/VA % pred: 82 %
DL/VA: 3.6 ml/min/mmHg/L
DLCO unc % pred: 86 %
DLCO unc: 15.2 ml/min/mmHg
FEF 25-75 Post: 1.46 L/sec
FEF 25-75 Pre: 1.2 L/sec
FEF2575-%Change-Post: 21 %
FEF2575-%Pred-Post: 67 %
FEF2575-%Pred-Pre: 55 %
FEV1-%Change-Post: 4 %
FEV1-%Pred-Post: 83 %
FEV1-%Pred-Pre: 79 %
FEV1-Post: 1.85 L
FEV1-Pre: 1.76 L
FEV1FVC-%Change-Post: 0 %
FEV1FVC-%Pred-Pre: 90 %
FEV6-%Change-Post: 4 %
FEV6-%Pred-Post: 93 %
FEV6-%Pred-Pre: 89 %
FEV6-Post: 2.58 L
FEV6-Pre: 2.47 L
FEV6FVC-%Change-Post: 0 %
FEV6FVC-%Pred-Post: 102 %
FEV6FVC-%Pred-Pre: 102 %
FVC-%Change-Post: 4 %
FVC-%Pred-Post: 90 %
FVC-%Pred-Pre: 87 %
FVC-Post: 2.6 L
FVC-Pre: 2.49 L
Post FEV1/FVC ratio: 71 %
Post FEV6/FVC ratio: 99 %
Pre FEV1/FVC ratio: 71 %
Pre FEV6/FVC Ratio: 99 %

## 2020-04-20 NOTE — Progress Notes (Signed)
PFT done today. 

## 2020-04-23 ENCOUNTER — Other Ambulatory Visit: Payer: Self-pay

## 2020-04-23 ENCOUNTER — Ambulatory Visit (INDEPENDENT_AMBULATORY_CARE_PROVIDER_SITE_OTHER): Payer: Medicare Other | Admitting: Pulmonary Disease

## 2020-04-23 ENCOUNTER — Encounter: Payer: Self-pay | Admitting: Pulmonary Disease

## 2020-04-23 VITALS — BP 126/74 | HR 110 | Temp 97.9°F | Ht 60.0 in | Wt 166.8 lb

## 2020-04-23 DIAGNOSIS — R0600 Dyspnea, unspecified: Secondary | ICD-10-CM | POA: Insufficient documentation

## 2020-04-23 DIAGNOSIS — R0609 Other forms of dyspnea: Secondary | ICD-10-CM | POA: Insufficient documentation

## 2020-04-23 DIAGNOSIS — J449 Chronic obstructive pulmonary disease, unspecified: Secondary | ICD-10-CM

## 2020-04-23 MED ORDER — TRELEGY ELLIPTA 100-62.5-25 MCG/INH IN AEPB: 1.0000 | INHALATION_SPRAY | Freq: Every day | RESPIRATORY_TRACT | 11 refills | Status: DC

## 2020-04-23 NOTE — Progress Notes (Signed)
@Patient  ID: , female    DOB: Sep 28, 1960, 59 y.o.   MRN: [id number]  Chief Complaint  Patient presents with  . COPD    PFT 04/20/20. Pt c/o sob with exertion but no other current complaints.     Referring provider: 14/6/21, DO  HPI:  Ashley Hodge is a 59 year old with PMH of alcohol abuse in remission, tobacco abuse whom we are seeing n follow up for DOE. Multiple recent ED notes for MSK issues reviewed.  DOE has improved somewhat with change to trelegy. Notes ongoing DOE with hills, inclines stairs but overall improved. Nervous today about results of recent PFTs. Scared of what they will show. Has a lot to live for, wants to have lots of time with family. Reviewed PFTs extensively that reveal no fixed obstruction, borderline low FEV1, no restriction with TLC WNL, no gas trapping but with N2 values could be underestimated. DLCO is within normal limits. Reviewed subtle or mild damage done from cigarette smoking in form of emphysema on olds scans, possible gas trapping. She is cutting back on cigarettes, down to 1-2 per day, goal to be off completely by Christmas day. Recent CXR 03/2020 reviewed and interpreted as normal.  HPI at initial visit: Patient was in normal state of health until mid to late July. She had onset of worsened cough. Increase in sputum production. Occured throughout the day. No clear difference morning, afternoon, or night. No clear exacerbating factors. Nothing made cough worse. She noted her O2 saturation dropped to 86% which prompted presentation to ED.  She has baseline daily cough that is usually productive. She attributes this to smoking. She also has baseline DOE that is not very limiting. She can do activities she wants but notes she needs to slow down from time to time. She attributes this to excess weight around her stomach. She denies seasonal allergies or recurrent bronchitis. No breathing troubles as a child. Never diagnosed with asthma.  She went  to the ED 12/10/19. Note reviewed. Noted to drop to 84% on RA with exertion. Was wheezy and had cough. Reviewed CT chest 12/03/2019 with clear lungs, emphysematous changes noted. Admitted and placed on IV steroids and abx. Improved and discharged on steroid taper and oral antibiotics. She has completed both. Cough back to baseline. Denies significant DOE or SOB associated with symptoms that sent her to hospital.   PMH: HTN, HLD Past surgical history: Tubal ligation Family History: DM in mother, brother with brain tumors Social History: 20-30 pack year history, working on quitting and actively reducing number of cigarettes smoked a day, former alcoholic sober 6-7 years  PFT: 2018 at [facility] reviewed and interpretered as with mild COPD FEV1/FVC 0.7 with FEV 1 83% predicted, TLC 109% predicted, RV 125% predicted demonstrating evidence of gas trapping.   WALK:  No flowsheet data found.  Imaging: Personally reviewed DG Chest 2 View  Result Date: 04/03/2020 CLINICAL DATA:  Chest pain EXAM: CHEST - 2 VIEW COMPARISON:  03/21/2020 FINDINGS: The heart size and mediastinal contours are within normal limits. Both lungs are clear. The visualized skeletal structures are unremarkable. IMPRESSION: Normal study. Electronically Signed   By: 13/10/2019 M.D.   On: 04/03/2020 22:53    Lab Results:   Reviewed and as per EMR    Component Value Date/Time   WBC 9.5 04/03/2020 2208   RBC 4.52 04/03/2020 2208   HGB 13.6 04/17/2020 0929   HCT 40.0 04/17/2020 0929   PLT 310 04/03/2020  2208   MCV 94.2 04/03/2020 2208   MCH 30.3 04/03/2020 2208   MCHC 32.2 04/03/2020 2208   RDW 11.3 (L) 04/03/2020 2208   LYMPHSABS 0.9 12/11/2019 0829   MONOABS 0.2 12/11/2019 0829   EOSABS 0.0 12/11/2019 0829   BASOSABS 0.0 12/11/2019 0829    BMET    Component Value Date/Time   NA 139 04/17/2020 0929   K 3.3 (L) 04/17/2020 0929   CL 108 04/17/2020 0929   CO2 23 04/03/2020 2208   GLUCOSE 117 (H) 04/17/2020 0929    BUN 11 04/17/2020 0929   CREATININE 0.70 04/17/2020 0929   CALCIUM 9.3 04/03/2020 2208   GFRNONAA >60 04/03/2020 2208   GFRAA >60 01/15/2020 0117    BNP No results found for: BNP  ProBNP No results found for: PROBNP  Specialty Problems      Pulmonary Problems   COPD with chronic bronchitis and emphysema (HCC)      Allergies  Allergen Reactions  . Lisinopril Other (See Comments) and Cough    cough    Immunization History  Administered Date(s) Administered  . PFIZER SARS-COV-2 Vaccination 08/11/2019, 09/25/2019  . Pneumococcal Conjugate-13 04/27/2016  . Pneumococcal Polysaccharide-23 04/01/2013, 02/11/2015    Past Medical History:  Diagnosis Date  . Anxiety   . Asthma   . COPD (chronic obstructive pulmonary disease) (HCC)   . High cholesterol   . Hypertension     Tobacco History: Social History   Tobacco Use  Smoking Status Current Every Day Smoker  . Packs/day: 1.50  . Years: 40.00  . Pack years: 60.00  . Types: Cigarettes  Smokeless Tobacco Never Used  Tobacco Comment   down to 2 cigarettes daily 04/23/20   Ready to quit: Not Answered Counseling given: Not Answered Comment: down to 2 cigarettes daily 04/23/20   Continue to not smoke  Outpatient Encounter Medications as of 04/23/2020  Medication Sig  . amLODipine (NORVASC) 5 MG tablet Take 5 mg by mouth daily.  . hydrochlorothiazide (HYDRODIURIL) 12.5 MG tablet Take 12.5 mg by mouth daily.  Marland Kitchen ibuprofen (ADVIL) 600 MG tablet Take 1 tablet (600 mg total) by mouth every 6 (six) hours as needed.  Marland Kitchen losartan (COZAAR) 50 MG tablet Take 50 mg by mouth daily.  . methocarbamol (ROBAXIN) 750 MG tablet Take 750 mg by mouth 3 (three) times daily as needed.  . pravastatin (PRAVACHOL) 80 MG tablet Take 80 mg by mouth daily.  . sucralfate (CARAFATE) 1 g tablet Take 1 tablet (1 g total) by mouth 4 (four) times daily -  with meals and at bedtime.  . [DISCONTINUED] Fluticasone-Umeclidin-Vilant (TRELEGY ELLIPTA)  100-62.5-25 MCG/INH AEPB Inhale 1 puff into the lungs daily.  . Fluticasone-Umeclidin-Vilant (TRELEGY ELLIPTA) 100-62.5-25 MCG/INH AEPB Inhale 1 puff into the lungs daily.  . [DISCONTINUED] fluticasone (FLONASE) 50 MCG/ACT nasal spray Place 2 sprays into both nostrils daily. (Patient not taking: Reported on 12/10/2019)  . [DISCONTINUED] ipratropium-albuterol (DUONEB) 0.5-2.5 (3) MG/3ML SOLN Take 3 mLs by nebulization every 6 (six) hours as needed (Wheezing and SOB). (Patient not taking: Reported on 12/25/2019)  . [DISCONTINUED] predniSONE (DELTASONE) 20 MG tablet Take 2 tablets (40 mg total) by mouth daily with breakfast. For the next four days (Patient not taking: Reported on 04/23/2020)   No facility-administered encounter medications on file as of 04/23/2020.     Review of Systems  Review of Systems  No chest pain with exertion, no orthopnea or PND. No weight loss. Comprehensive review of systems otherwise negative.  Physical Exam  BP 126/74 (BP Location: Left Arm, Cuff Size: Normal)   Pulse (!) 110   Temp 97.9 F (36.6 C) (Temporal)   Ht 5' (1.524 m)   Wt 166 lb 12.8 oz (75.7 kg)   SpO2 98%   BMI 32.58 kg/m   Wt Readings from Last 5 Encounters:  04/23/20 166 lb 12.8 oz (75.7 kg)  04/17/20 165 lb (74.8 kg)  04/17/20 160 lb (72.6 kg)  04/04/20 164 lb 14.5 oz (74.8 kg)  03/15/20 165 lb (74.8 kg)    BMI Readings from Last 5 Encounters:  04/23/20 32.58 kg/m  04/17/20 32.22 kg/m  04/17/20 31.25 kg/m  04/04/20 32.21 kg/m  03/15/20 32.22 kg/m     Physical Exam General: tearful, sitting in chair Eyes: EOMI, no icterus  Respiratory: CTAB, no wheeze, good air movement Cardiovascular: RRR, no murmurs Abd; Soft, NT, BS present MSK: no joint effusions, no synovitis Neuro: Normal gait aided with walker, CN intact Psych: AAO, normal mood, full affect   Assessment & Plan:   Ashley Hodge is a 59 year old with PMH of alcohol abuse in remission, tobacco abuse whom we are  seeing in follow up for DOE.  DOE: Symptoms of chronic bronchitis. Admitted 2021 COPD exacerbation with increase cough, mild hypoxemia. Symptoms resolved with steroids and abx. Well controlled symptoms prior. Given hospitalization, increased to ICS/LABA/LAMA with Trelegy. Repeat PFTs 04/2020 are nromal. Symptoms improved with trelegy. New script sent to pharmacy.  Suspect element of deconditioning.   Tobacco Abuse: Congratulated on nearly quitting. Advocated for nicotine lozenges to replace urges for 1-2 cigarettes a day currently. She agreed and will try.  HCM: Has received both doses and booster mRNA vaccine for Covid. Flu shot UTD.  No follow-ups on file.   Karren Burly, MD 04/23/2020

## 2020-04-23 NOTE — Patient Instructions (Addendum)
Nice to see you again  The breathing tests are normal  Continue Trelegy daily - refill sent  Return for follow up in 6 months with Dr. Judeth Horn

## 2020-05-04 ENCOUNTER — Telehealth: Payer: Self-pay | Admitting: Pulmonary Disease

## 2020-05-04 ENCOUNTER — Ambulatory Visit: Payer: Medicare Other

## 2020-05-04 DIAGNOSIS — J449 Chronic obstructive pulmonary disease, unspecified: Secondary | ICD-10-CM

## 2020-05-04 NOTE — Telephone Encounter (Signed)
Order placed as requested.  Nothing further needed at this time- will close encounter.   

## 2020-05-04 NOTE — Telephone Encounter (Signed)
Spoke with pt, requesting that we place an order to [employer] to pick up all her O2 equipment.  States she no longer wears her O2 and wishes to have it removed from her home.  Dr. Judeth Horn ok to place order to D/C all supplemental O2? Thanks!

## 2020-05-04 NOTE — Telephone Encounter (Signed)
Yes please - thank you!

## 2020-05-11 ENCOUNTER — Other Ambulatory Visit: Payer: Self-pay

## 2020-05-11 DIAGNOSIS — I10 Essential (primary) hypertension: Secondary | ICD-10-CM | POA: Diagnosis not present

## 2020-05-11 DIAGNOSIS — R5383 Other fatigue: Secondary | ICD-10-CM | POA: Insufficient documentation

## 2020-05-11 DIAGNOSIS — J45909 Unspecified asthma, uncomplicated: Secondary | ICD-10-CM | POA: Diagnosis not present

## 2020-05-11 DIAGNOSIS — J449 Chronic obstructive pulmonary disease, unspecified: Secondary | ICD-10-CM | POA: Insufficient documentation

## 2020-05-11 DIAGNOSIS — Z20822 Contact with and (suspected) exposure to covid-19: Secondary | ICD-10-CM | POA: Diagnosis not present

## 2020-05-11 DIAGNOSIS — F17210 Nicotine dependence, cigarettes, uncomplicated: Secondary | ICD-10-CM | POA: Diagnosis not present

## 2020-05-11 DIAGNOSIS — R110 Nausea: Secondary | ICD-10-CM | POA: Diagnosis not present

## 2020-05-11 LAB — CBC
HCT: 45 % (ref 36.0–46.0)
Hemoglobin: 14.9 g/dL (ref 12.0–15.0)
MCH: 30.8 pg (ref 26.0–34.0)
MCHC: 33.1 g/dL (ref 30.0–36.0)
MCV: 93 fL (ref 80.0–100.0)
Platelets: 360 10*3/uL (ref 150–400)
RBC: 4.84 MIL/uL (ref 3.87–5.11)
RDW: 11.8 % (ref 11.5–15.5)
WBC: 16.8 10*3/uL — ABNORMAL HIGH (ref 4.0–10.5)
nRBC: 0 % (ref 0.0–0.2)

## 2020-05-11 LAB — BASIC METABOLIC PANEL
Anion gap: 12 (ref 5–15)
BUN: 18 mg/dL (ref 6–20)
CO2: 25 mmol/L (ref 22–32)
Calcium: 9.1 mg/dL (ref 8.9–10.3)
Chloride: 104 mmol/L (ref 98–111)
Creatinine, Ser: 0.81 mg/dL (ref 0.44–1.00)
GFR, Estimated: >60 mL/min (ref >60)
Glucose, Bld: 117 mg/dL — ABNORMAL HIGH (ref 70–99)
Potassium: 3.6 mmol/L (ref 3.5–5.1)
Sodium: 141 mmol/L (ref 135–145)

## 2020-05-11 LAB — TROPONIN I (HIGH SENSITIVITY): Troponin I (High Sensitivity): 4 ng/L (ref <18)

## 2020-05-11 NOTE — ED Triage Notes (Signed)
Patient reports she "doesn't feel good" patient reports she feels like her grip strength in her left arm is not as good as it used to be and her BP has been high and her HR has been high

## 2020-05-12 ENCOUNTER — Emergency Department (HOSPITAL_COMMUNITY)
Admission: EM | Admit: 2020-05-12 | Discharge: 2020-05-12 | Disposition: A | Discharge status: Home / Self Care | Payer: Medicare Other | Attending: Emergency Medicine | Admitting: Emergency Medicine

## 2020-05-12 DIAGNOSIS — R110 Nausea: Secondary | ICD-10-CM

## 2020-05-12 DIAGNOSIS — R5383 Other fatigue: Secondary | ICD-10-CM

## 2020-05-12 LAB — RESP PANEL BY RT-PCR (FLU A&B, COVID) ARPGX2
Influenza A by PCR: NEGATIVE
Influenza B by PCR: NEGATIVE
SARS Coronavirus 2 by RT PCR: NEGATIVE

## 2020-05-12 MED ORDER — ONDANSETRON 4 MG PO TBDP: 4.0000 mg | ORAL_TABLET | Freq: Three times a day (TID) | ORAL | 0 refills | Status: DC | PRN

## 2020-05-12 NOTE — Discharge Instructions (Addendum)
You were seen in the emergency department today with fatigue and nausea.  I have refilled your Zofran prescription and will have you call your primary care doctor to follow closely in the next week.  I am testing you for Covid and flu.  These test results will come back in the next several hours on your phone in the MyChart app.  There is information in the discharge paperwork about how to set that up on your phone.  If you develop any shortness of breath, chest pain, abdominal pain you should return to the emergency department.

## 2020-05-12 NOTE — ED Provider Notes (Signed)
Emergency Department Provider Note   I have reviewed the triage vital signs and the nursing notes.   HISTORY  Chief Complaint Generalized Body Aches   HPI Ashley Hodge is a 59 y.o. female with past medical history reviewed below presents the emergency department for evaluation of nausea with fatigue which began today.  Patient states that she saw her PCP last week and was diagnosed with nonspecific viral infection.  She had some coughing and fatigue at that time.  She states Covid and flu testing were not performed but she was started on steroid medications which initially made her feel better.  She states that yesterday she was watching her grandchildren when she became nauseated and fatigued.  She did not experience chest pain, palpitations, shortness of breath.  No abdominal pain, back pain, flank pain.  No dysuria, hesitancy, urgency.  She does have some baseline pain in the left arm with subjective weakness but that is unchanged from prior. No leg symptoms. No radiation of symptoms or modifying factors. She notes that in the past she has taken Zofran for nausea symptoms but is out of this medication.   Past Medical History:  Diagnosis Date  . Anxiety   . Asthma   . COPD (chronic obstructive pulmonary disease) (HCC)   . High cholesterol   . Hypertension     Patient Active Problem List   Diagnosis Date Noted  . DOE (dyspnea on exertion) 04/23/2020  . COPD with chronic bronchitis and emphysema (HCC) 12/11/2019  . Pre-diabetes 12/11/2019  . Hypokalemia 12/10/2019  . Essential hypertension 12/10/2019  . Hyperlipidemia 12/10/2019    Past Surgical History:  Procedure Laterality Date  . TUBAL LIGATION      Allergies Lisinopril  Family History  Problem Relation Age of Onset  . Heart disease Mother   . Hypertension Mother     Social History Social History   Tobacco Use  . Smoking status: Current Every Day Smoker    Packs/day: 1.50    Years: 40.00    Pack years:  60.00    Types: Cigarettes  . Smokeless tobacco: Never Used  . Tobacco comment: down to 2 cigarettes daily 04/23/20  Vaping Use  . Vaping Use: Never used  Substance Use Topics  . Alcohol use: Not Currently  . Drug use: Never    Review of Systems  Constitutional: No fever/chills. Positive fatigue.  Eyes: No visual changes. ENT: No sore throat. Cardiovascular: Denies chest pain. Respiratory: Denies shortness of breath. Gastrointestinal: No abdominal pain. Positive nausea, no vomiting.  No diarrhea.  No constipation. Genitourinary: Negative for dysuria. Musculoskeletal: Negative for back pain. Skin: Negative for rash. Neurological: Negative for headaches, focal weakness or numbness.  10-point ROS otherwise negative.  ____________________________________________   PHYSICAL EXAM:  VITAL SIGNS: ED Triage Vitals  Enc Vitals Group     BP 05/11/20 2102 (!) 162/86     Pulse Rate 05/11/20 2102 (!) 110     Resp 05/11/20 2102 15     Temp 05/11/20 2102 98.2 F (36.8 C)     Temp Source 05/11/20 2102 Oral     SpO2 05/11/20 2102 94 %     Weight 05/11/20 2102 160 lb (72.6 kg)     Height 05/11/20 2102 5' (1.524 m)   Constitutional: Alert and oriented. Well appearing and in no acute distress. Eyes: Conjunctivae are normal.  Head: Atraumatic. Nose: No congestion/rhinnorhea. Mouth/Throat: Mucous membranes are moist.  Neck: No stridor.   Cardiovascular: Normal rate, regular rhythm. Good  peripheral circulation. Grossly normal heart sounds.   Respiratory: Normal respiratory effort.  No retractions. Lungs CTAB. Gastrointestinal: Soft and nontender. No distention.  Musculoskeletal: No gross deformities of extremities. Neurologic:  Normal speech and language. Skin:  Skin is warm, dry and intact. No rash noted.  ____________________________________________   LABS (all labs ordered are listed, but only abnormal results are displayed)  Labs Reviewed  CBC - Abnormal; Notable for the  following components:      Result Value   WBC 16.8 (*)    All other components within normal limits  BASIC METABOLIC PANEL - Abnormal; Notable for the following components:   Glucose, Bld 117 (*)    All other components within normal limits  RESP PANEL BY RT-PCR (FLU A&B, COVID) ARPGX2  TROPONIN I (HIGH SENSITIVITY)   ____________________________________________  EKG   EKG Interpretation  Date/Time:  Monday May 11 2020 21:09:00 EST Ventricular Rate:  107 PR Interval:    QRS Duration: 77 QT Interval:  331 QTC Calculation: 442 R Axis:   23 Text Interpretation: Sinus tachycardia Low voltage, precordial leads 12 Lead; Mason-Likar No STEMI Confirmed by Alona Bene [phone]) on 05/12/2020 4:00:25 AM       ____________________________________________  RADIOLOGY  None  ____________________________________________   PROCEDURES  Procedure(s) performed:   Procedures  None  ____________________________________________   INITIAL IMPRESSION / ASSESSMENT AND PLAN / ED COURSE  Pertinent labs & imaging results that were available during my care of the patient were reviewed by me and considered in my medical decision making (see chart for details).   Patient presents emergency department with generalized fatigue and nausea.  No abdominal tenderness or subjective pain symptoms.  Doubt atypical ACS with no chest pain and unremarkable EKG other than tachycardia.  In prior ED presentations patient has had heart rate of similar level.  She does have leukocytosis here but is been on steroid with presumed bronchitis type symptoms.  I do plan for Covid and flu testing which the patient will follow on her MyChart app after ED discharge.  Information at discharge provided regarding how to set this up on her phone if he does not already have it.  Troponin negative.  Lungs are clear with normal oxygen saturation.  Do not plan on emergent chest x-ray at this time.  Exceedingly low suspicion  for PE in this clinical setting.    ____________________________________________  FINAL CLINICAL IMPRESSION(S) / ED DIAGNOSES  Final diagnoses:  Nausea  Fatigue, unspecified type    NEW OUTPATIENT MEDICATIONS STARTED DURING THIS VISIT:  New Prescriptions   ONDANSETRON (ZOFRAN ODT) 4 MG DISINTEGRATING TABLET    Take 1 tablet (4 mg total) by mouth every 8 (eight) hours as needed for nausea or vomiting.    Note:  This document was prepared using Dragon voice recognition software and may include unintentional dictation errors.  Alona Bene, MD, [facility]    Jerney Baksh, Arlyss Repress, MD 05/12/20 [phone]

## 2020-05-12 NOTE — ED Provider Notes (Signed)
Emergency Department Provider Note   I have reviewed the triage vital signs and the nursing notes.   HISTORY  Chief Complaint Generalized Body Aches   HPI Ashley Hodge is a 59 y.o. female presents to the ED with fatigue and nausea. Symptoms began today and have continued. No fever, chills, CP, or SOB. No abdominal pain with nausea. Denies any body aches. Patient notes some nasal congestion and chronic cough. No radiation of symptoms or modifying factors.    Past Medical History:  Diagnosis Date  . Anxiety   . Asthma   . COPD (chronic obstructive pulmonary disease) (HCC)   . High cholesterol   . Hypertension     Patient Active Problem List   Diagnosis Date Noted  . DOE (dyspnea on exertion) 04/23/2020  . COPD with chronic bronchitis and emphysema (HCC) 12/11/2019  . Pre-diabetes 12/11/2019  . Hypokalemia 12/10/2019  . Essential hypertension 12/10/2019  . Hyperlipidemia 12/10/2019    Past Surgical History:  Procedure Laterality Date  . TUBAL LIGATION      Allergies Lisinopril  Family History  Problem Relation Age of Onset  . Heart disease Mother   . Hypertension Mother     Social History Social History   Tobacco Use  . Smoking status: Current Every Day Smoker    Packs/day: 1.50    Years: 40.00    Pack years: 60.00    Types: Cigarettes  . Smokeless tobacco: Never Used  . Tobacco comment: down to 2 cigarettes daily 04/23/20  Vaping Use  . Vaping Use: Never used  Substance Use Topics  . Alcohol use: Not Currently  . Drug use: Never    Review of Systems  Constitutional: No fever/chills. Positive fatigue.  Eyes: No visual changes. ENT: No sore throat. Cardiovascular: Denies chest pain. Respiratory: Denies shortness of breath. Gastrointestinal: No abdominal pain. Positive nausea, no vomiting.  No diarrhea.  No constipation. Genitourinary: Negative for dysuria. Musculoskeletal: Negative for back pain. Skin: Negative for rash. Neurological:  Negative for headaches, focal weakness or numbness.  10-point ROS otherwise negative.  ____________________________________________   PHYSICAL EXAM:  VITAL SIGNS: ED Triage Vitals  Enc Vitals Group     BP 05/11/20 2102 (!) 162/86     Pulse Rate 05/11/20 2102 (!) 110     Resp 05/11/20 2102 15     Temp 05/11/20 2102 98.2 F (36.8 C)     Temp Source 05/11/20 2102 Oral     SpO2 05/11/20 2102 94 %     Weight 05/11/20 2102 160 lb (72.6 kg)     Height 05/11/20 2102 5' (1.524 m)   Constitutional: Alert and oriented. Well appearing and in no acute distress. Eyes: Conjunctivae are normal. Head: Atraumatic. Nose: No congestion/rhinnorhea. Mouth/Throat: Mucous membranes are moist.  Neck: No stridor. Cardiovascular: Normal rate, regular rhythm. Good peripheral circulation. Grossly normal heart sounds.   Respiratory: Normal respiratory effort.  No retractions. Lungs CTAB. Gastrointestinal: Soft and nontender. No distention.  Musculoskeletal: No lower extremity tenderness nor edema. No gross deformities of extremities. Neurologic:  Normal speech and language. No gross focal neurologic deficits are appreciated.  Skin:  Skin is warm, dry and intact. No rash noted.  ____________________________________________   LABS (all labs ordered are listed, but only abnormal results are displayed)  Labs Reviewed  CBC - Abnormal; Notable for the following components:      Result Value   WBC 16.8 (*)    All other components within normal limits  BASIC METABOLIC PANEL - Abnormal;  Notable for the following components:   Glucose, Bld 117 (*)    All other components within normal limits  RESP PANEL BY RT-PCR (FLU A&B, COVID) ARPGX2  TROPONIN I (HIGH SENSITIVITY)   ____________________________________________  EKG   EKG Interpretation  Date/Time:  Monday May 11 2020 21:09:00 EST Ventricular Rate:  107 PR Interval:    QRS Duration: 77 QT Interval:  331 QTC Calculation: 442 R  Axis:   23 Text Interpretation: Sinus tachycardia Low voltage, precordial leads 12 Lead; Mason-Likar No STEMI Confirmed by Alona Bene [phone]) on 05/12/2020 4:00:25 AM       ____________________________________________  RADIOLOGY  None ____________________________________________   PROCEDURES  Procedure(s) performed:   Procedures  None ____________________________________________   INITIAL IMPRESSION / ASSESSMENT AND PLAN / ED COURSE  Pertinent labs & imaging results that were available during my care of the patient were reviewed by me and considered in my medical decision making (see chart for details).   Patient presents to the ED with nausea and fatigue. No abdominal tenderness. No CP to suspect ACS. Labs reviewed without acute findings. Doubt PE. Plan for supportive care at home and Zofran Rx refill. COVID negative.   At this time, I do not feel there is any life-threatening condition present. I have reviewed and discussed all results (EKG, imaging, lab, urine as appropriate), exam findings with patient. I have reviewed nursing notes and appropriate previous records.  I feel the patient is safe to be discharged home without further emergent workup. Discussed usual and customary return precautions. Patient and family (if present) verbalize understanding and are comfortable with this plan.  Patient will follow-up with their primary care provider. If they do not have a primary care provider, information for follow-up has been provided to them. All questions have been answered.  ____________________________________________  FINAL CLINICAL IMPRESSION(S) / ED DIAGNOSES  Final diagnoses:  Nausea  Fatigue, unspecified type     NEW OUTPATIENT MEDICATIONS STARTED DURING THIS VISIT:  Discharge Medication List as of 05/12/2020  4:37 AM    START taking these medications   Details  ondansetron (ZOFRAN ODT) 4 MG disintegrating tablet Take 1 tablet (4 mg total) by mouth every 8  (eight) hours as needed for nausea or vomiting., Starting Tue 05/12/2020, Normal        Note:  This document was prepared using Dragon voice recognition software and may include unintentional dictation errors.  Alona Bene, MD, [facility]    Nell Schrack, Arlyss Repress, MD 05/12/20 2030

## 2020-05-27 ENCOUNTER — Other Ambulatory Visit: Payer: Self-pay | Admitting: Student

## 2020-05-30 ENCOUNTER — Encounter (HOSPITAL_COMMUNITY): Payer: Self-pay | Admitting: Emergency Medicine

## 2020-05-30 ENCOUNTER — Other Ambulatory Visit: Payer: Self-pay

## 2020-05-30 DIAGNOSIS — M25512 Pain in left shoulder: Secondary | ICD-10-CM | POA: Diagnosis not present

## 2020-05-30 DIAGNOSIS — Z5321 Procedure and treatment not carried out due to patient leaving prior to being seen by health care provider: Secondary | ICD-10-CM | POA: Insufficient documentation

## 2020-05-30 DIAGNOSIS — R079 Chest pain, unspecified: Secondary | ICD-10-CM | POA: Diagnosis present

## 2020-05-30 LAB — BASIC METABOLIC PANEL
Anion gap: 9 (ref 5–15)
BUN: 15 mg/dL (ref 6–20)
CO2: 26 mmol/L (ref 22–32)
Calcium: 8.9 mg/dL (ref 8.9–10.3)
Chloride: 106 mmol/L (ref 98–111)
Creatinine, Ser: 0.86 mg/dL (ref 0.44–1.00)
GFR, Estimated: >60 mL/min (ref >60)
Glucose, Bld: 117 mg/dL — ABNORMAL HIGH (ref 70–99)
Potassium: 3.9 mmol/L (ref 3.5–5.1)
Sodium: 141 mmol/L (ref 135–145)

## 2020-05-30 LAB — CBC
HCT: 43 % (ref 36.0–46.0)
Hemoglobin: 14 g/dL (ref 12.0–15.0)
MCH: 30.7 pg (ref 26.0–34.0)
MCHC: 32.6 g/dL (ref 30.0–36.0)
MCV: 94.3 fL (ref 80.0–100.0)
Platelets: 304 10*3/uL (ref 150–400)
RBC: 4.56 MIL/uL (ref 3.87–5.11)
RDW: 12 % (ref 11.5–15.5)
WBC: 13.2 10*3/uL — ABNORMAL HIGH (ref 4.0–10.5)
nRBC: 0 % (ref 0.0–0.2)

## 2020-05-30 LAB — I-STAT BETA HCG BLOOD, ED (MC, WL, AP ONLY): I-stat hCG, quantitative: 9.3 m[IU]/mL — ABNORMAL HIGH (ref <5)

## 2020-05-30 LAB — TROPONIN I (HIGH SENSITIVITY): Troponin I (High Sensitivity): <2 ng/L (ref <18)

## 2020-05-30 NOTE — ED Triage Notes (Signed)
Patient reports central chest pain radiating to left shoulder since yesterday. Reports taking muscle relaxer with some relief.

## 2020-05-31 ENCOUNTER — Emergency Department (HOSPITAL_COMMUNITY)
Admission: EM | Admit: 2020-05-31 | Discharge: 2020-05-31 | Disposition: A | Discharge status: Home / Self Care | Payer: 59 | Attending: Emergency Medicine | Admitting: Emergency Medicine

## 2020-06-08 ENCOUNTER — Encounter (HOSPITAL_COMMUNITY): Payer: Self-pay | Admitting: Obstetrics and Gynecology

## 2020-06-08 ENCOUNTER — Emergency Department (HOSPITAL_COMMUNITY): Payer: 59

## 2020-06-08 ENCOUNTER — Other Ambulatory Visit: Payer: Self-pay

## 2020-06-08 DIAGNOSIS — R0981 Nasal congestion: Secondary | ICD-10-CM | POA: Diagnosis not present

## 2020-06-08 DIAGNOSIS — R059 Cough, unspecified: Secondary | ICD-10-CM | POA: Diagnosis not present

## 2020-06-08 DIAGNOSIS — Z5321 Procedure and treatment not carried out due to patient leaving prior to being seen by health care provider: Secondary | ICD-10-CM | POA: Insufficient documentation

## 2020-06-08 NOTE — ED Triage Notes (Signed)
Patient reports to the ER for cough and congestion.

## 2020-06-09 ENCOUNTER — Emergency Department (HOSPITAL_COMMUNITY)
Admission: EM | Admit: 2020-06-09 | Discharge: 2020-06-09 | Disposition: A | Discharge status: Home / Self Care | Payer: 59 | Attending: Emergency Medicine | Admitting: Emergency Medicine

## 2020-07-09 ENCOUNTER — Encounter: Payer: Self-pay | Admitting: Physical Medicine and Rehabilitation

## 2020-08-20 ENCOUNTER — Encounter: Payer: 59 | Attending: Physical Medicine and Rehabilitation | Admitting: Physical Medicine and Rehabilitation

## 2021-05-21 ENCOUNTER — Encounter (HOSPITAL_COMMUNITY): Payer: Self-pay | Admitting: Emergency Medicine

## 2021-05-21 ENCOUNTER — Emergency Department (HOSPITAL_COMMUNITY)
Admission: EM | Admit: 2021-05-21 | Discharge: 2021-05-22 | Disposition: A | Discharge status: Home / Self Care | Payer: Medicare Other | Attending: Emergency Medicine | Admitting: Emergency Medicine

## 2021-05-21 ENCOUNTER — Other Ambulatory Visit: Payer: Self-pay

## 2021-05-21 DIAGNOSIS — M549 Dorsalgia, unspecified: Secondary | ICD-10-CM | POA: Diagnosis present

## 2021-05-21 DIAGNOSIS — Z79899 Other long term (current) drug therapy: Secondary | ICD-10-CM | POA: Diagnosis not present

## 2021-05-21 DIAGNOSIS — I10 Essential (primary) hypertension: Secondary | ICD-10-CM | POA: Insufficient documentation

## 2021-05-21 DIAGNOSIS — J449 Chronic obstructive pulmonary disease, unspecified: Secondary | ICD-10-CM | POA: Diagnosis not present

## 2021-05-21 DIAGNOSIS — M546 Pain in thoracic spine: Secondary | ICD-10-CM | POA: Diagnosis not present

## 2021-05-21 DIAGNOSIS — R200 Anesthesia of skin: Secondary | ICD-10-CM | POA: Insufficient documentation

## 2021-05-21 DIAGNOSIS — G8929 Other chronic pain: Secondary | ICD-10-CM | POA: Insufficient documentation

## 2021-05-21 LAB — CBC WITH DIFFERENTIAL/PLATELET
Abs Immature Granulocytes: 0.03 10*3/uL (ref 0.00–0.07)
Basophils Absolute: 0.1 10*3/uL (ref 0.0–0.1)
Basophils Relative: 0 %
Eosinophils Absolute: 0.3 10*3/uL (ref 0.0–0.5)
Eosinophils Relative: 2 %
HCT: 42.3 % (ref 36.0–46.0)
Hemoglobin: 13.6 g/dL (ref 12.0–15.0)
Immature Granulocytes: 0 %
Lymphocytes Relative: 23 %
Lymphs Abs: 2.8 10*3/uL (ref 0.7–4.0)
MCH: 31.2 pg (ref 26.0–34.0)
MCHC: 32.2 g/dL (ref 30.0–36.0)
MCV: 97 fL (ref 80.0–100.0)
Monocytes Absolute: 0.7 10*3/uL (ref 0.1–1.0)
Monocytes Relative: 6 %
Neutro Abs: 8.1 10*3/uL — ABNORMAL HIGH (ref 1.7–7.7)
Neutrophils Relative %: 69 %
Platelets: 274 10*3/uL (ref 150–400)
RBC: 4.36 MIL/uL (ref 3.87–5.11)
RDW: 12.2 % (ref 11.5–15.5)
WBC: 11.9 10*3/uL — ABNORMAL HIGH (ref 4.0–10.5)
nRBC: 0 % (ref 0.0–0.2)

## 2021-05-21 LAB — BASIC METABOLIC PANEL
Anion gap: 5 (ref 5–15)
BUN: 9 mg/dL (ref 6–20)
CO2: 23 mmol/L (ref 22–32)
Calcium: 8.9 mg/dL (ref 8.9–10.3)
Chloride: 113 mmol/L — ABNORMAL HIGH (ref 98–111)
Creatinine, Ser: 0.81 mg/dL (ref 0.44–1.00)
GFR, Estimated: >60 mL/min (ref >60)
Glucose, Bld: 134 mg/dL — ABNORMAL HIGH (ref 70–99)
Potassium: 4 mmol/L (ref 3.5–5.1)
Sodium: 141 mmol/L (ref 135–145)

## 2021-05-21 NOTE — ED Provider Triage Note (Signed)
Emergency Medicine Provider Triage Evaluation Note  Ashley Hodge , a 61 y.o. female  was evaluated in triage.  Pt complains of back and shoulder pain that started yesterday.  Worse when she coughs.  History of COPD.  Also now has tingling going down her left arm. No history of ACS.  Review of Systems  Positive: As above Negative: Chest pain, shortness of breath, palpitations  Physical Exam  BP 120/61 (BP Location: Right Arm)    Pulse 98    Temp 98.8 F (37.1 C) (Oral)    Resp 16    Ht 5' (1.524 m)    Wt 73 kg    SpO2 98%    BMI 31.43 kg/m  Gen:   Awake, no distress   Resp:  Normal effort  MSK:   Moves extremities without difficulty  Other:  Heart sounds normal, lungs wheezy, but this is reportedly baseline  Medical Decision Making  Medically screening exam initiated at 6:46 PM.  Appropriate orders placed.  Ashley Hodge was informed that the remainder of the evaluation will be completed by another provider, this initial triage assessment does not replace that evaluation, and the importance of remaining in the ED until their evaluation is complete.   Vital signs stable, low suspicion for dissection or ACS at this time.  Pain reproducible, likely musculoskeletal.   Saddie Benders, PA-C 05/21/21 1848

## 2021-05-21 NOTE — ED Triage Notes (Signed)
Reports L posterior upper shoulder pain that has moved to her L arm since yesterday. Reports chronic cough. Tylenol w/ minor relief.

## 2021-05-22 ENCOUNTER — Emergency Department (HOSPITAL_COMMUNITY): Payer: Medicare Other

## 2021-05-22 LAB — TROPONIN I (HIGH SENSITIVITY): Troponin I (High Sensitivity): <2 ng/L (ref <18)

## 2021-05-22 MED ORDER — METHOCARBAMOL 500 MG PO TABS
500.0000 mg | ORAL_TABLET | Freq: Once | ORAL | Status: AC
  Administered 2021-05-22: 500 mg via ORAL
  Filled 2021-05-22: qty 1

## 2021-05-22 MED ORDER — METHOCARBAMOL 500 MG PO TABS: 500.0000 mg | ORAL_TABLET | Freq: Two times a day (BID) | ORAL | 0 refills | Status: DC

## 2021-05-22 MED ORDER — ACETAMINOPHEN 325 MG PO TABS
650.0000 mg | ORAL_TABLET | Freq: Once | ORAL | Status: AC
  Administered 2021-05-22: 650 mg via ORAL
  Filled 2021-05-22: qty 2

## 2021-05-22 NOTE — Discharge Instructions (Addendum)
You were seen in the emergency department today for upper left-sided back pain.  While you were here we ensure that you are not having any cardiac event in your lungs looked okay.  All this was normal.  This is likely musculoskeletal back pain.  I am providing you with a prescription for Robaxin which is a muscle relaxant as this may be muscle spasms.  Additionally can take ibuprofen and Tylenol as needed for pain relief.  Please return to the emergency department for worsening chest pain, shortness of breath or fevers.

## 2021-05-22 NOTE — ED Provider Notes (Signed)
[facility]-EMERGENCY DEPT Provider Note   CSN: [id number] Arrival date & time: 05/21/21  1740     History  Chief Complaint  Patient presents with   Back Pain   arm numbness    Ashley Hodge is a 61 y.o. female.  With past medical history of hypertension, hyperlipidemia, COPD who presents to the emergency department with back pain and arm numbness.  She states that beginning Thursday she had a sudden onset of left-sided back pain that she describes as constant and dull.  She states that initially she was having pain with deep breathing.  She states that since Thursday the pain has been intermittent and has stopped worsening with deep inspiration.  Additionally she notes that she has had some left arm numbness and tingling.  She denies chest pain, shortness of breath, cough.  Additionally she denies fevers, left upper extremity swelling, estrogen use,lower extremity edema, long trips or drives, recent surgery or immobilization, active cancer.  She is a current smoker.  Denies trauma to her back.  Of note she has been seen multiple times in the past for similar pain.  I discussed this with her and she states that it is different this time in severity.  She also states that previously she has not had pain with deep breathing.   Back Pain Associated symptoms: numbness   Associated symptoms: no chest pain and no fever       Home Medications Prior to Admission medications   Medication Sig Start Date End Date Taking? Authorizing Provider  amLODipine (NORVASC) 5 MG tablet Take 5 mg by mouth daily. 09/07/17   [provider]  Fluticasone-Umeclidin-Vilant (TRELEGY ELLIPTA) 100-62.5-25 MCG/INH AEPB Inhale 1 puff into the lungs daily. 04/23/20   Hunsucker, Lesia Sago, MD  hydrochlorothiazide (HYDRODIURIL) 12.5 MG tablet Take 12.5 mg by mouth daily. 09/07/17   [provider]  ibuprofen (ADVIL) 600 MG tablet Take 1 tablet (600 mg total) by mouth every 6 (six)  hours as needed. 03/21/20   Fayrene Helper, PA-C  losartan (COZAAR) 50 MG tablet Take 50 mg by mouth daily. 09/07/17   [provider]  methocarbamol (ROBAXIN) 750 MG tablet Take 750 mg by mouth 3 (three) times daily as needed. 03/19/20   [provider]  ondansetron (ZOFRAN ODT) 4 MG disintegrating tablet Take 1 tablet (4 mg total) by mouth every 8 (eight) hours as needed for nausea or vomiting. 05/12/20   Long, Arlyss Repress, MD  pravastatin (PRAVACHOL) 80 MG tablet Take 80 mg by mouth daily. 05/21/19   [provider]  sucralfate (CARAFATE) 1 g tablet Take 1 tablet (1 g total) by mouth 4 (four) times daily -  with meals and at bedtime. 03/16/20   Molpus, John, MD  fluticasone (FLONASE) 50 MCG/ACT nasal spray Place 2 sprays into both nostrils daily. Patient not taking: Reported on 12/10/2019 11/04/19 01/31/20  Myra Rude, MD  ipratropium-albuterol (DUONEB) 0.5-2.5 (3) MG/3ML SOLN Take 3 mLs by nebulization every 6 (six) hours as needed (Wheezing and SOB). Patient not taking: Reported on 12/25/2019 12/11/19 01/31/20  Marguerita Merles Latif, DO      Allergies    Lisinopril    Review of Systems   Review of Systems  Constitutional:  Negative for fatigue and fever.  Respiratory:  Negative for cough and shortness of breath.   Cardiovascular:  Negative for chest pain, palpitations and leg swelling.  Musculoskeletal:  Positive for back pain and myalgias. Negative for neck pain.  Neurological:  Positive for numbness.  All other systems reviewed and are negative.  Physical Exam Updated Vital Signs BP 125/70    Pulse 80    Temp 98.8 F (37.1 C) (Oral)    Resp 18    Ht 5' (1.524 m)    Wt 73 kg    SpO2 98%    BMI 31.43 kg/m  Physical Exam Vitals and nursing note reviewed.  Constitutional:      Appearance: Normal appearance.  HENT:     Head: Normocephalic.     Mouth/Throat:     Mouth: Mucous membranes are moist.     Pharynx: Oropharynx is clear.  Eyes:     General: No scleral  icterus.    Extraocular Movements: Extraocular movements intact.     Pupils: Pupils are equal, round, and reactive to light.  Cardiovascular:     Rate and Rhythm: Normal rate and regular rhythm.     Pulses: Normal pulses.          Radial pulses are 2+ on the right side and 2+ on the left side.     Heart sounds: Normal heart sounds. No murmur heard. Pulmonary:     Effort: Pulmonary effort is normal. No tachypnea or respiratory distress.     Breath sounds: Examination of the right-lower field reveals rales. Examination of the left-lower field reveals rales. Rales present.    Chest:     Chest wall: No tenderness.  Abdominal:     General: Bowel sounds are normal.     Palpations: Abdomen is soft.  Musculoskeletal:        General: Tenderness present. No swelling. Normal range of motion.     Cervical back: Full passive range of motion without pain, normal range of motion and neck supple. No tenderness.     Thoracic back: Tenderness present. No bony tenderness.       Back:     Right lower leg: No edema.     Left lower leg: No edema.  Skin:    General: Skin is warm and dry.     Capillary Refill: Capillary refill takes less than 2 seconds.  Neurological:     General: No focal deficit present.     Mental Status: She is alert and oriented to person, place, and time. Mental status is at baseline.     Motor: No weakness.  Psychiatric:        Mood and Affect: Mood normal.        Behavior: Behavior normal.        Thought Content: Thought content normal.        Judgment: Judgment normal.    ED Results / Procedures / Treatments   Labs (all labs ordered are listed, but only abnormal results are displayed) Labs Reviewed  BASIC METABOLIC PANEL - Abnormal; Notable for the following components:      Result Value   Chloride 113 (*)    Glucose, Bld 134 (*)    All other components within normal limits  CBC WITH DIFFERENTIAL/PLATELET - Abnormal; Notable for the following components:   WBC 11.9  (*)    Neutro Abs 8.1 (*)    All other components within normal limits  TROPONIN I (HIGH SENSITIVITY)  TROPONIN I (HIGH SENSITIVITY)   EKG EKG Interpretation  Date/Time:  Saturday May 22 2021 09:55:33 EST Ventricular Rate:  71 PR Interval:  148 QRS Duration: 95 QT Interval:  402 QTC Calculation: 437 R Axis:   32 Text Interpretation: Sinus rhythm Low  voltage, precordial leads Abnormal R-wave progression, early transition Confirmed by Alona Bene [phone]) on 05/22/2021 10:28:01 AM  Radiology DG Chest 2 View  Result Date: 05/22/2021 CLINICAL DATA:  61 year old female with chest pain. Left shoulder and arm pain. EXAM: CHEST - 2 VIEW COMPARISON:  Chest radiographs 06/08/2020 and earlier. FINDINGS: Lung volumes and mediastinal contours remain normal. Visualized tracheal air column is within normal limits. Both lungs appear stable and clear. No pneumothorax or pleural effusion. Mild eventration of the right hemidiaphragm, normal variant. Negative visible bowel gas and osseous structures. IMPRESSION: Negative.  No acute cardiopulmonary abnormality. Electronically Signed   By: Odessa Fleming M.D.   On: 05/22/2021 09:41    Procedures Procedures  05/22/2021 11:36 AM Sinus   Medications Ordered in ED Medications  acetaminophen (TYLENOL) tablet 650 mg (650 mg Oral Given 05/22/21 1102)  methocarbamol (ROBAXIN) tablet 500 mg (500 mg Oral Given 05/22/21 1103)    ED Course/ Medical Decision Making/ A&P  Medical Decision Making 61 year old female who presents emergency department with left-sided back pain and left arm numbness.  Initial differential includes PE, ACS, pneumonia, musculoskeletal pain, dissection, pneumothorax.  Presentation is not consistent with pneumothorax or dissection.  Chest x-ray without evidence of pneumothorax, pleural effusion or pneumonia. Patient does not have any infectious symptoms which support low suspicion for pneumonia.  No fever, shortness of breath or new sputum  production. Given that it is left-sided back pain obtained EKG which shows no ischemia or infarction, initial troponin negative.  The patient is not having chest pain or shortness of breath.  Low suspicion for ACS. Heart score 2  Unable to [facility] outpatient due to age.  However the patient has very little risk factors.  She is Wells low risk so will defer D-dimer at this time.  Her presentation is not consistent with PE. She has been seen prior to this for similar symptoms of back pain and numbness.  Pain is reproducible on physical exam.  She has parascapular tenderness to palpation, as well as paraspinal tenderness to palpation.  There is been no trauma.  There are no neurological deficits on exam of her left arm.  Likely musculoskeletal pain.  She has been treated here with Tylenol and Robaxin with improvement in symptoms.  I have independently reviewed the chest x-ray as well as EKG and agree with readings. Given Well's low risk score, did not d-dimer and do not think necessary to CTA PE scan patient at this time.   I have review previous ED visits in patient chart as well as admissions.   I discussed findings with patient as well as plan of care to discharge with muscle relaxant and ibuprofen.  She understands return to emergency department for worsening pain is associated with shortness of breath, chest pain or syncope.  She verbalized understanding.  Vital signs are stable.  She is safe for discharge.  Final Clinical Impression(s) / ED Diagnoses Final diagnoses:  Chronic left-sided thoracic back pain    Rx / DC Orders ED Discharge Orders          Ordered    methocarbamol (ROBAXIN) 500 MG tablet  2 times daily        05/22/21 1136              Cristopher Peru, PA-C 05/22/21 1136    Long, Arlyss Repress, MD 05/23/21 [phone]

## 2021-05-22 NOTE — ED Notes (Signed)
This Clinical research associate tried to obtain bloodwork from patient. When tourniquet was applied to pt's arm, pt stated "This is way too tight, I've had blood drawn before and you don't need to put this on so tight." This Clinical research associate apologized for causing discomfort and readjusted tourniquet, explaining that tourniquet is needed to facilitate finding a vein. Patient stated "Have you actually ever drawn blood before? No one ever puts stuff that tight on me." This writer adjusted tourniquet again and asked patient if it felt better. Patient replied "I don't need you in here giving me that kind of attitude, you're not touching me. Get out of my room, good bye." RN aware.

## 2021-07-05 ENCOUNTER — Encounter (HOSPITAL_COMMUNITY): Payer: Self-pay | Admitting: Emergency Medicine

## 2021-07-05 ENCOUNTER — Emergency Department (HOSPITAL_COMMUNITY): Payer: Medicare Other

## 2021-07-05 ENCOUNTER — Other Ambulatory Visit: Payer: Self-pay

## 2021-07-05 ENCOUNTER — Emergency Department (HOSPITAL_COMMUNITY)
Admission: EM | Admit: 2021-07-05 | Discharge: 2021-07-05 | Disposition: A | Discharge status: Home / Self Care | Payer: Medicare Other | Attending: Emergency Medicine | Admitting: Emergency Medicine

## 2021-07-05 DIAGNOSIS — M25511 Pain in right shoulder: Secondary | ICD-10-CM | POA: Diagnosis not present

## 2021-07-05 DIAGNOSIS — R059 Cough, unspecified: Secondary | ICD-10-CM | POA: Diagnosis not present

## 2021-07-05 DIAGNOSIS — M25512 Pain in left shoulder: Secondary | ICD-10-CM | POA: Insufficient documentation

## 2021-07-05 DIAGNOSIS — R0602 Shortness of breath: Secondary | ICD-10-CM | POA: Insufficient documentation

## 2021-07-05 DIAGNOSIS — R079 Chest pain, unspecified: Secondary | ICD-10-CM | POA: Diagnosis not present

## 2021-07-05 DIAGNOSIS — Z5321 Procedure and treatment not carried out due to patient leaving prior to being seen by health care provider: Secondary | ICD-10-CM | POA: Diagnosis not present

## 2021-07-05 DIAGNOSIS — Z8616 Personal history of COVID-19: Secondary | ICD-10-CM | POA: Diagnosis not present

## 2021-07-05 DIAGNOSIS — Z1231 Encounter for screening mammogram for malignant neoplasm of breast: Secondary | ICD-10-CM

## 2021-07-05 LAB — CBC
HCT: 43.4 % (ref 36.0–46.0)
Hemoglobin: 14.5 g/dL (ref 12.0–15.0)
MCH: 31.6 pg (ref 26.0–34.0)
MCHC: 33.4 g/dL (ref 30.0–36.0)
MCV: 94.6 fL (ref 80.0–100.0)
Platelets: 267 10*3/uL (ref 150–400)
RBC: 4.59 MIL/uL (ref 3.87–5.11)
RDW: 11.7 % (ref 11.5–15.5)
WBC: 7.7 10*3/uL (ref 4.0–10.5)
nRBC: 0 % (ref 0.0–0.2)

## 2021-07-05 LAB — I-STAT BETA HCG BLOOD, ED (MC, WL, AP ONLY): I-stat hCG, quantitative: 8.4 m[IU]/mL — ABNORMAL HIGH (ref <5)

## 2021-07-05 LAB — BASIC METABOLIC PANEL
Anion gap: 10 (ref 5–15)
BUN: 9 mg/dL (ref 6–20)
CO2: 24 mmol/L (ref 22–32)
Calcium: 9.1 mg/dL (ref 8.9–10.3)
Chloride: 109 mmol/L (ref 98–111)
Creatinine, Ser: 0.83 mg/dL (ref 0.44–1.00)
GFR, Estimated: >60 mL/min (ref >60)
Glucose, Bld: 108 mg/dL — ABNORMAL HIGH (ref 70–99)
Potassium: 4.8 mmol/L (ref 3.5–5.1)
Sodium: 143 mmol/L (ref 135–145)

## 2021-07-05 LAB — TROPONIN I (HIGH SENSITIVITY): Troponin I (High Sensitivity): 4 ng/L (ref <18)

## 2021-07-05 NOTE — ED Notes (Signed)
No answer in lobby for labs

## 2021-07-05 NOTE — ED Notes (Signed)
Patient called for vitals recheck. No response.  

## 2021-07-05 NOTE — ED Notes (Addendum)
Patient called for vitals recheck. No response. Will call again soon.  °

## 2021-07-05 NOTE — ED Provider Triage Note (Signed)
Emergency Medicine Provider Triage Evaluation Note  Ashley Hodge , a 61 y.o. female  was evaluated in triage.  Pt complains of chest pressure, bilateral shoulder pain, productive cough, and shortness of breath for 2 days.  Patient states that she recently got over COVID infection.  Her pain and shortness of breath is worse after coughing, as well as exertion.  Review of Systems  Positive: As above Negative: Fevers, chills, nausea, leg pain/swelling  Physical Exam  BP (!) 150/80 (BP Location: Right Arm)    Pulse 84    Temp 98.4 F (36.9 C) (Oral)    Resp 20    SpO2 97%  Gen:   Awake, no distress   Resp:  Normal effort  MSK:   Moves extremities without difficulty  Other:    Medical Decision Making  Medically screening exam initiated at 4:13 PM.  Appropriate orders placed.  Ashley Hodge was informed that the remainder of the evaluation will be completed by another provider, this initial triage assessment does not replace that evaluation, and the importance of remaining in the ED until their evaluation is complete.     Kateri Plummer, PA-C 07/05/21 [phone]

## 2021-07-05 NOTE — ED Notes (Signed)
Patient called for vitals recheck. No response. Will call again soon.

## 2021-07-05 NOTE — ED Notes (Signed)
No answer for labs

## 2021-07-05 NOTE — ED Triage Notes (Signed)
Patient c/o central chest pain and bilateral shoulder pain x 2-3 days. States recently getting over covid (2 weeks ago).

## 2021-07-14 ENCOUNTER — Emergency Department (HOSPITAL_COMMUNITY)
Admission: EM | Admit: 2021-07-14 | Discharge: 2021-07-14 | Disposition: A | Discharge status: Home / Self Care | Payer: Medicare Other | Attending: Emergency Medicine | Admitting: Emergency Medicine

## 2021-07-14 ENCOUNTER — Encounter (HOSPITAL_COMMUNITY): Payer: Self-pay | Admitting: Emergency Medicine

## 2021-07-14 ENCOUNTER — Emergency Department (HOSPITAL_COMMUNITY): Payer: Medicare Other

## 2021-07-14 DIAGNOSIS — Z8616 Personal history of COVID-19: Secondary | ICD-10-CM | POA: Diagnosis not present

## 2021-07-14 DIAGNOSIS — Z7951 Long term (current) use of inhaled steroids: Secondary | ICD-10-CM | POA: Diagnosis not present

## 2021-07-14 DIAGNOSIS — J449 Chronic obstructive pulmonary disease, unspecified: Secondary | ICD-10-CM | POA: Diagnosis not present

## 2021-07-14 DIAGNOSIS — D72829 Elevated white blood cell count, unspecified: Secondary | ICD-10-CM | POA: Insufficient documentation

## 2021-07-14 DIAGNOSIS — R0789 Other chest pain: Secondary | ICD-10-CM | POA: Insufficient documentation

## 2021-07-14 DIAGNOSIS — R739 Hyperglycemia, unspecified: Secondary | ICD-10-CM | POA: Diagnosis not present

## 2021-07-14 DIAGNOSIS — Z79899 Other long term (current) drug therapy: Secondary | ICD-10-CM | POA: Insufficient documentation

## 2021-07-14 DIAGNOSIS — M546 Pain in thoracic spine: Secondary | ICD-10-CM | POA: Insufficient documentation

## 2021-07-14 DIAGNOSIS — M549 Dorsalgia, unspecified: Secondary | ICD-10-CM

## 2021-07-14 DIAGNOSIS — R079 Chest pain, unspecified: Secondary | ICD-10-CM | POA: Diagnosis present

## 2021-07-14 DIAGNOSIS — I10 Essential (primary) hypertension: Secondary | ICD-10-CM | POA: Diagnosis not present

## 2021-07-14 LAB — CBC
HCT: 41.3 % (ref 36.0–46.0)
Hemoglobin: 13.5 g/dL (ref 12.0–15.0)
MCH: 31.4 pg (ref 26.0–34.0)
MCHC: 32.7 g/dL (ref 30.0–36.0)
MCV: 96 fL (ref 80.0–100.0)
Platelets: 231 10*3/uL (ref 150–400)
RBC: 4.3 MIL/uL (ref 3.87–5.11)
RDW: 12.1 % (ref 11.5–15.5)
WBC: 12.9 10*3/uL — ABNORMAL HIGH (ref 4.0–10.5)
nRBC: 0 % (ref 0.0–0.2)

## 2021-07-14 LAB — TROPONIN I (HIGH SENSITIVITY)
Troponin I (High Sensitivity): 2 ng/L (ref <18)
Troponin I (High Sensitivity): 3 ng/L (ref <18)

## 2021-07-14 LAB — COMPREHENSIVE METABOLIC PANEL
ALT: 19 U/L (ref 0–44)
AST: 21 U/L (ref 15–41)
Albumin: 3.9 g/dL (ref 3.5–5.0)
Alkaline Phosphatase: 65 U/L (ref 38–126)
Anion gap: 7 (ref 5–15)
BUN: 7 mg/dL (ref 6–20)
CO2: 25 mmol/L (ref 22–32)
Calcium: 8.9 mg/dL (ref 8.9–10.3)
Chloride: 109 mmol/L (ref 98–111)
Creatinine, Ser: 0.74 mg/dL (ref 0.44–1.00)
GFR, Estimated: >60 mL/min (ref >60)
Glucose, Bld: 159 mg/dL — ABNORMAL HIGH (ref 70–99)
Potassium: 3.5 mmol/L (ref 3.5–5.1)
Sodium: 141 mmol/L (ref 135–145)
Total Bilirubin: 0.2 mg/dL — ABNORMAL LOW (ref 0.3–1.2)
Total Protein: 7 g/dL (ref 6.5–8.1)

## 2021-07-14 NOTE — ED Provider Triage Note (Signed)
Emergency Medicine Provider Triage Evaluation Note ? ?Ashley Hodge , a 61 y.o. female  was evaluated in triage.  Pt complains of pain in the back, running across the chest, and into the other back.  Started this morning around 7:30AM.  Felt nauseous and took tylenol + zofran.  Nausea came back so came in to ED.  Pain not worsened by palpation.  Hx COPD.  No known cardiac hx.  Seen several times for these symptoms.  Recently had Covid. ? ?Review of Systems  ?Positive: Chest pain, back pain, nausea ?Negative: Fever, SOB ? ?Physical Exam  ?There were no vitals taken for this visit. ?Gen:   Awake, no distress   ?Resp:  Normal effort, CTAB ?MSK:   Moves extremities without difficulty  ?Other:  Tachycardic, 96% O2 on RA ? ?Medical Decision Making  ?Medically screening exam initiated at 1:37 PM.  Appropriate orders placed.  Ashley Hodge was informed that the remainder of the evaluation will be completed by another provider, this initial triage assessment does not replace that evaluation, and the importance of remaining in the ED until their evaluation is complete. ? ?Labs, imaging, EKG ordered ?  ?Cecil Cobbs, PA-C ?07/14/21 1347 ? ?

## 2021-07-14 NOTE — ED Triage Notes (Signed)
Per pt, states having B/L upper back pain radiating to shoulders down into chest-states she was given Flexeril but it is not working ?

## 2021-07-14 NOTE — ED Provider Notes (Signed)
[facility]-EMERGENCY DEPT Provider Note   CSN: [id number] Arrival date & time: 07/14/21  1323     History  Chief Complaint  Patient presents with   Back Pain    Ashley Hodge is a 61 y.o. female presenting today with bilateral upper back pain that radiates across her chest since today.  Pain described as sharp and constant.  Has been seen for this several times in the last few months.  Tried Flexeril with no relief.  Denies N/V, constipation, diarrhea, abdominal pain.  Denies numbness, tingling, or focal neurodeficits.  Denies shortness of breath or chest tightness or palpitations.  History of COPD.  No known cardiac history.  Was recently diagnosed with COVID.  The history is provided by the patient and medical records.  Back Pain Associated symptoms: chest pain       Home Medications Prior to Admission medications   Medication Sig Start Date End Date Taking? Authorizing Provider  albuterol (VENTOLIN HFA) 108 (90 Base) MCG/ACT inhaler Inhale 2 puffs into the lungs every 6 (six) hours as needed for wheezing or shortness of breath. 04/22/21   [provider]  amLODipine (NORVASC) 5 MG tablet Take 5 mg by mouth daily. 09/07/17   [provider]  ezetimibe (ZETIA) 10 MG tablet Take 10 mg by mouth daily. 05/11/21   [provider]  Fluticasone-Umeclidin-Vilant (TRELEGY ELLIPTA) 100-62.5-25 MCG/INH AEPB Inhale 1 puff into the lungs daily. 04/23/20   Hunsucker, Lesia Sago, MD  hydrochlorothiazide (HYDRODIURIL) 12.5 MG tablet Take 12.5 mg by mouth daily. 09/07/17   [provider]  losartan (COZAAR) 50 MG tablet Take 50 mg by mouth daily. 09/07/17   [provider]  methocarbamol (ROBAXIN) 500 MG tablet Take 1 tablet (500 mg total) by mouth 2 (two) times daily. 05/22/21   Cristopher Peru, PA-C  ondansetron (ZOFRAN ODT) 4 MG disintegrating tablet Take 1 tablet (4 mg total) by mouth every 8 (eight) hours as needed for nausea or  vomiting. 05/12/20   Long, Arlyss Repress, MD  pantoprazole (PROTONIX) 20 MG tablet Take 20 mg by mouth daily. 05/11/21   [provider]  potassium chloride (MICRO-K) 10 MEQ CR capsule Take 10 mEq by mouth daily. 05/11/21   [provider]  pravastatin (PRAVACHOL) 80 MG tablet Take 80 mg by mouth daily. 05/21/19   [provider]  rosuvastatin (CRESTOR) 20 MG tablet Take 20 mg by mouth at bedtime. 05/11/21   [provider]  fluticasone (FLONASE) 50 MCG/ACT nasal spray Place 2 sprays into both nostrils daily. Patient not taking: Reported on 12/10/2019 11/04/19 01/31/20  Myra Rude, MD  ipratropium-albuterol (DUONEB) 0.5-2.5 (3) MG/3ML SOLN Take 3 mLs by nebulization every 6 (six) hours as needed (Wheezing and SOB). Patient not taking: Reported on 12/25/2019 12/11/19 01/31/20  Marguerita Merles Latif, DO      Allergies    Lisinopril    Review of Systems   Review of Systems  Cardiovascular:  Positive for chest pain. Negative for palpitations and leg swelling.  Musculoskeletal:  Positive for back pain.   Physical Exam Updated Vital Signs BP 125/68    Pulse 77    Temp 98.2 F (36.8 C) (Oral)    Resp 14    SpO2 95%  Physical Exam  ED Results / Procedures / Treatments   Labs (all labs ordered are listed, but only abnormal results are displayed) Labs Reviewed  CBC - Abnormal; Notable for the following components:  Result Value   WBC 12.9 (*)    All other components within normal limits  COMPREHENSIVE METABOLIC PANEL - Abnormal; Notable for the following components:   Glucose, Bld 159 (*)    Total Bilirubin 0.2 (*)    All other components within normal limits  TROPONIN I (HIGH SENSITIVITY)  TROPONIN I (HIGH SENSITIVITY)    EKG EKG Interpretation  Date/Time:  Wednesday July 14 2021 14:17:21 EST Ventricular Rate:  89 PR Interval:  131 QRS Duration: 88 QT Interval:  359 QTC Calculation: 437 R Axis:   54 Text Interpretation: Sinus rhythm Low  voltage, precordial leads No significant change since last tracing Confirmed by Gwyneth Sprout ([id number]) on 07/14/2021 5:05:18 PM  Radiology DG Chest 2 View  Result Date: 07/14/2021 CLINICAL DATA:  Posterior left chest pain. Smoker with history of asthma/COPD. EXAM: CHEST - 2 VIEW COMPARISON:  Radiographs 07/05/2021 and 05/22/2021. FINDINGS: The heart size and mediastinal contours are stable. Stable mild emphysematous changes with central airway thickening. The lungs are otherwise clear. There is no pleural effusion or pneumothorax. There are stable mild degenerative changes in the thoracic spine. IMPRESSION: Stable chest.  No evidence of active cardiopulmonary process. Electronically Signed   By: Carey Bullocks M.D.   On: 07/14/2021 14:17    Procedures Procedures    Medications Ordered in ED Medications - No data to display  ED Course/ Medical Decision Making/ A&P                           Medical Decision Making Amount and/or Complexity of Data Reviewed External Data Reviewed: notes. Labs: ordered. Decision-making details documented in ED Course. Radiology: ordered and independent interpretation performed. Decision-making details documented in ED Course.   61 y.o. female presents to the ED for concern of Back Pain and chest pain.  This involves an extensive number of treatment options, and is a complaint that carries with it a high risk of complications and morbidity.  The differential diagnosis includes acute coronary syndrome, costochondritis, MSK soft tissue injury  Comorbidities that complicate the patient evaluation include hypertension, COPD, hyperlipidemia  Additional history obtained from internal/external records available via epic  Interpretation: I ordered, and personally interpreted labs.  The pertinent results include: Elevated WBC of 12.9 and elevated glucose of 159.  Negative troponin value of 2, not suspicious of STEMI/NSTEMI.  Electrolytes and LFTs unremarkable.  No  indications of anemia.  I ordered imaging studies including chest x-ray.  I independently visualized and interpreted imaging which showed no acute cardiopulmonary process or pathology.  I agree with the radiologist interpretation  I ordered and independently visualized/interpreted EKG which showed no significant ST segment changes, patterns of ischemia, or arrhythmia.  Normal sinus rhythm observed.  ED Course: Patient well-appearing and presented with vague and objective back and chest pain symptoms.  Considered acute cardiac nature, but physical exam, labs, history, and EKG does not support this.  Considered acute pulmonary nature such as his COPD exacerbation, but physical exam illustrates clear lung sounds and imaging does not indicate acute lung pathology.  Benign MSK soft tissue strain or sprain or costochondritis considered, but pain not reproducible on physical exam.  Patient has been seen for the symptoms multiple times over the last few years.  When she was informed her tests and labs did not indicate an acute emergency, patient states she was not surprised.  Patient expresses she then wishes to be discharged.  Overall, I am uncertain the exact  etiology of the patient's symptoms.  However, I do not believe she is currently experiencing a medical, surgical, or psychiatric emergency.  Encouraged outpatient follow-up with primary care for further work-up and reevaluation.  Social Determinants of Health include Medicaid  Disposition: I discussed the patient and their case with my attending, Dr. Freida Busman, who agreed with the proposed treatment course.  After consideration of the diagnostic results and the patient's response to treatment, I feel that the patent would benefit from outpatient follow-up with primary care and over-the-counter pain management.  Discussed course of treatment thoroughly with the patient and she  demonstrated understanding.  Patient in agreement and has no further  questions.         Final Clinical Impression(s) / ED Diagnoses Final diagnoses:  Atypical back pain  Atypical chest pain    Rx / DC Orders ED Discharge Orders     None         Sandrea Hammond 07/14/21 2323    Gwyneth Sprout, MD 07/15/21 1625

## 2021-07-14 NOTE — Discharge Instructions (Addendum)
Keep your appointment with your primary care as discussed and follow-up with them in the next week or so. ? ?Return to the ED for new or worsening symptoms as discussed. ? ?You may use Tylenol for OTC pain management as discussed. ?

## 2021-07-20 ENCOUNTER — Other Ambulatory Visit: Payer: Self-pay | Admitting: Family Medicine

## 2021-07-20 ENCOUNTER — Ambulatory Visit: Payer: Medicaid Other

## 2021-07-20 ENCOUNTER — Inpatient Hospital Stay: Admit: 2021-07-20 | Payer: Medicaid Other

## 2021-07-20 DIAGNOSIS — Z139 Encounter for screening, unspecified: Secondary | ICD-10-CM

## 2021-08-11 ENCOUNTER — Emergency Department (HOSPITAL_COMMUNITY)
Admission: EM | Admit: 2021-08-11 | Discharge: 2021-08-12 | Disposition: A | Discharge status: Home / Self Care | Payer: Medicare Other | Attending: Emergency Medicine | Admitting: Emergency Medicine

## 2021-08-11 ENCOUNTER — Emergency Department (HOSPITAL_COMMUNITY): Payer: Medicare Other

## 2021-08-11 DIAGNOSIS — M25512 Pain in left shoulder: Secondary | ICD-10-CM | POA: Diagnosis not present

## 2021-08-11 DIAGNOSIS — J449 Chronic obstructive pulmonary disease, unspecified: Secondary | ICD-10-CM | POA: Insufficient documentation

## 2021-08-11 DIAGNOSIS — I10 Essential (primary) hypertension: Secondary | ICD-10-CM | POA: Insufficient documentation

## 2021-08-11 DIAGNOSIS — M25511 Pain in right shoulder: Secondary | ICD-10-CM | POA: Diagnosis not present

## 2021-08-11 DIAGNOSIS — R739 Hyperglycemia, unspecified: Secondary | ICD-10-CM | POA: Diagnosis not present

## 2021-08-11 DIAGNOSIS — J45909 Unspecified asthma, uncomplicated: Secondary | ICD-10-CM | POA: Insufficient documentation

## 2021-08-11 LAB — BASIC METABOLIC PANEL
Anion gap: 8 (ref 5–15)
BUN: 7 mg/dL — ABNORMAL LOW (ref 8–23)
CO2: 27 mmol/L (ref 22–32)
Calcium: 9.3 mg/dL (ref 8.9–10.3)
Chloride: 107 mmol/L (ref 98–111)
Creatinine, Ser: 0.79 mg/dL (ref 0.44–1.00)
GFR, Estimated: >60 mL/min (ref >60)
Glucose, Bld: 119 mg/dL — ABNORMAL HIGH (ref 70–99)
Potassium: 4.4 mmol/L (ref 3.5–5.1)
Sodium: 142 mmol/L (ref 135–145)

## 2021-08-11 LAB — CBC
HCT: 44.4 % (ref 36.0–46.0)
Hemoglobin: 14.6 g/dL (ref 12.0–15.0)
MCH: 31.5 pg (ref 26.0–34.0)
MCHC: 32.9 g/dL (ref 30.0–36.0)
MCV: 95.9 fL (ref 80.0–100.0)
Platelets: 283 10*3/uL (ref 150–400)
RBC: 4.63 MIL/uL (ref 3.87–5.11)
RDW: 11.8 % (ref 11.5–15.5)
WBC: 12.4 10*3/uL — ABNORMAL HIGH (ref 4.0–10.5)
nRBC: 0 % (ref 0.0–0.2)

## 2021-08-11 LAB — TROPONIN I (HIGH SENSITIVITY)
Troponin I (High Sensitivity): 4 ng/L (ref <18)
Troponin I (High Sensitivity): 5 ng/L (ref <18)

## 2021-08-11 NOTE — ED Triage Notes (Signed)
Pt c/o jaw pain, neck pain, shoulder pain, leg & feet pain x1wk+, states she's seen multiple providers but nothing is helping.  ?

## 2021-08-11 NOTE — ED Provider Triage Note (Signed)
Emergency Medicine Provider Triage Evaluation Note ? ?Ashley Hodge , a 61 y.o. female  was evaluated in triage.  Pt complains of shoulder pain that has been going on since Sunday.  It radiates to her jaw. Pain is constant. Denies chest pain or shortness of breath. No injury of trauma. She also complains of pains shooting down her left legs. This has been going on for 1 day. No hx of back trauma. No lower back pain. ? ?Review of Systems  ?Positive:  ?Negative:  ? ?Physical Exam  ?BP (!) 158/83 (BP Location: Right Arm)   Pulse (!) 112   Temp 98.3 ?F (36.8 ?C) (Oral)   Resp 18   SpO2 100%  ?Gen:   Awake, no distress   ?Resp:  Normal effort  ?MSK:   Moves extremities without difficulty  ?Other:  Heart RRR, no murmur. Lungs clear. No L spine ttp or paraspinal tenderness.  ? ?Medical Decision Making  ?Medically screening exam initiated at 8:02 PM.  Appropriate orders placed.  ZI SEK was informed that the remainder of the evaluation will be completed by another provider, this initial triage assessment does not replace that evaluation, and the importance of remaining in the ED until their evaluation is complete. ? ?Chest pain workup. Leg pain sounds like sciatica ?  ?Claudie Leach, PA-C ?08/11/21 2004 ? ?

## 2021-08-12 DIAGNOSIS — M25512 Pain in left shoulder: Secondary | ICD-10-CM | POA: Diagnosis not present

## 2021-08-12 MED ORDER — HYDROCODONE-ACETAMINOPHEN 5-325 MG PO TABS: 1.0000 | ORAL_TABLET | Freq: Four times a day (QID) | ORAL | 0 refills | Status: DC | PRN

## 2021-08-12 MED ORDER — HYDROCODONE-ACETAMINOPHEN 5-325 MG PO TABS
1.0000 | ORAL_TABLET | Freq: Once | ORAL | Status: AC
  Administered 2021-08-12: 1 via ORAL
  Filled 2021-08-12: qty 1

## 2021-08-12 NOTE — ED Provider Notes (Signed)
?[facility] ?Provider Note ? ? ?CSN: [id number] ?Arrival date & time: 08/11/21  1949 ? ?  ? ?History ? ?Chief Complaint  ?Patient presents with  ? Neck Pain  ? Shoulder Pain  ? Jaw Pain  ? Leg Pain  ? ? ?Ashley Hodge is a 61 y.o. female. ? ?The history is provided by the patient.  ?Shoulder Pain ? ?Patient presents with multiple complaints.  She reports has had bilateral shoulder pain for several weeks.  It is worse with position at times.  No arm weakness.  She also reports she has some jaw and neck pain earlier in the day.  No chest pain or shortness of breath.  No recent falls or trauma.  She reports multiple evaluations without any diagnosis ? ?Past Medical History:  ?Diagnosis Date  ? Anxiety   ? Asthma   ? COPD (chronic obstructive pulmonary disease) ([place])   ? High cholesterol   ? Hypertension   ? ? ?Home Medications ?Prior to Admission medications   ?Medication Sig Start Date End Date Taking? Authorizing Provider  ?HYDROcodone-acetaminophen (NORCO/VICODIN) 5-325 MG tablet Take 1 tablet by mouth every 6 (six) hours as needed for severe pain. 08/12/21  Yes Ripley Fraise, MD  ?albuterol (VENTOLIN HFA) 108 (90 Base) MCG/ACT inhaler Inhale 2 puffs into the lungs every 6 (six) hours as needed for wheezing or shortness of breath. 04/22/21   [provider]  ?amLODipine (NORVASC) 5 MG tablet Take 5 mg by mouth daily. 09/07/17   [provider]  ?ezetimibe (ZETIA) 10 MG tablet Take 10 mg by mouth daily. 05/11/21   [provider]  ?Fluticasone-Umeclidin-Vilant (TRELEGY ELLIPTA) 100-62.5-25 MCG/INH AEPB Inhale 1 puff into the lungs daily. 04/23/20   Hunsucker, Bonna Gains, MD  ?hydrochlorothiazide (HYDRODIURIL) 12.5 MG tablet Take 12.5 mg by mouth daily. 09/07/17   [provider]  ?losartan (COZAAR) 50 MG tablet Take 50 mg by mouth daily. 09/07/17   [provider]  ?methocarbamol (ROBAXIN) 500 MG tablet Take 1 tablet (500 mg total) by  mouth 2 (two) times daily. 05/22/21   Mickie Hillier, PA-C  ?ondansetron (ZOFRAN ODT) 4 MG disintegrating tablet Take 1 tablet (4 mg total) by mouth every 8 (eight) hours as needed for nausea or vomiting. 05/12/20   Long, Wonda Olds, MD  ?pantoprazole (PROTONIX) 20 MG tablet Take 20 mg by mouth daily. 05/11/21   [provider]  ?potassium chloride (MICRO-K) 10 MEQ CR capsule Take 10 mEq by mouth daily. 05/11/21   [provider]  ?pravastatin (PRAVACHOL) 80 MG tablet Take 80 mg by mouth daily. 05/21/19   [provider]  ?rosuvastatin (CRESTOR) 20 MG tablet Take 20 mg by mouth at bedtime. 05/11/21   [provider]  ?fluticasone (FLONASE) 50 MCG/ACT nasal spray Place 2 sprays into both nostrils daily. ?Patient not taking: Reported on 12/10/2019 11/04/19 01/31/20  Rosemarie Ax, MD  ?ipratropium-albuterol (DUONEB) 0.5-2.5 (3) MG/3ML SOLN Take 3 mLs by nebulization every 6 (six) hours as needed (Wheezing and SOB). ?Patient not taking: Reported on 12/25/2019 12/11/19 01/31/20  Kerney Elbe, DO  ?   ? ?Allergies    ?Lisinopril   ? ?Review of Systems   ?Review of Systems  ?HENT:  Negative for trouble swallowing.   ?Respiratory:  Negative for shortness of breath.   ?Cardiovascular:  Negative for chest pain.  ?Gastrointestinal:  Negative for vomiting.  ?Musculoskeletal:  Positive for arthralgias.  ?Neurological:  Negative for weakness and headaches.  ?  All other systems reviewed and are negative. ? ?Physical Exam ?Updated Vital Signs ?BP 113/67   Pulse 82   Temp 98.3 ?F (36.8 ?C) (Oral)   Resp 14   SpO2 97%  ?Physical Exam ?CONSTITUTIONAL: Well developed/well nourished ?HEAD: Normocephalic/atraumatic ?EYES: EOMI/PERRL ?ENMT: Mucous membranes moist, poor dentition, but no oral swelling. ?NECK: supple no meningeal signs no anterior neck edema ?SPINE/BACK:entire spine nontender ?Mild cervical paraspinal tenderness ?CV: S1/S2 noted, no murmurs/rubs/gallops noted ?LUNGS: Lungs are clear  to auscultation bilaterally, no apparent distress ?ABDOMEN: soft, nontender, no rebound or guarding, bowel sounds noted throughout abdomen ?GU:no cva tenderness ?NEURO: Pt is awake/alert/appropriate, moves all extremitiesx4.  No facial droop.   ?Equal power (5/5) with hand grip, wrist flex/extension, elbow flex/extension, and equal power with shoulder abduction/adduction.  No focal sensory deficit to light touch is noted in either UE.   ?Equal (2+) biceps/brachioradialis reflex in bilateral UE ?EXTREMITIES: pulses normal/equal, full ROM, no tenderness, no warmth or edema noted to either shoulder.  Full range of motion of both shoulders ?SKIN: warm, color normal ?PSYCH: no abnormalities of mood noted, alert and oriented to situation ? ?ED Results / Procedures / Treatments   ?Labs ?(all labs ordered are listed, but only abnormal results are displayed) ?Labs Reviewed  ?BASIC METABOLIC PANEL - Abnormal; Notable for the following components:  ?    Result Value  ? Glucose, Bld 119 (*)   ? BUN 7 (*)   ? All other components within normal limits  ?CBC - Abnormal; Notable for the following components:  ? WBC 12.4 (*)   ? All other components within normal limits  ?TROPONIN I (HIGH SENSITIVITY)  ?TROPONIN I (HIGH SENSITIVITY)  ? ? ?EKG ?EKG Interpretation ? ?Date/Time:  Wednesday August 11 2021 21:59:06 EDT ?Ventricular Rate:  81 ?PR Interval:  136 ?QRS Duration: 82 ?QT Interval:  362 ?QTC Calculation: 420 ?R Axis:   43 ?Text Interpretation: Normal sinus rhythm Low voltage QRS Borderline ECG Confirmed by Ripley Fraise [phone]) on 08/12/2021 12:43:27 AM ? ?Radiology ?DG Chest 1 View ? ?Result Date: 08/11/2021 ?CLINICAL DATA:  Neck pain and shoulder pain. EXAM: CHEST  1 VIEW COMPARISON:  Chest radiograph dated 07/14/2021 and CT dated 12/03/2019. FINDINGS: The heart size and mediastinal contours are within normal limits. Both lungs are clear. The visualized skeletal structures are unremarkable. IMPRESSION: No active disease.  Electronically Signed   By: Anner Crete M.D.   On: 08/11/2021 20:48   ? ?Procedures ?Procedures  ? ? ?Medications Ordered in ED ?Medications  ?HYDROcodone-acetaminophen (NORCO/VICODIN) 5-325 MG per tablet 1 tablet (1 tablet Oral Given 08/12/21 0136)  ? ? ?ED Course/ Medical Decision Making/ A&P ?Clinical Course as of 08/12/21 0259  ?Thu Aug 12, 2021  ?0112 Glucose(!): 119 ?Mild [DW]  ?0113 Hyperglycemia [DW]  ?0113 WBC(!): 12.4 ?Leukocytosis [DW]  ?  ?Clinical Course User Index ?[DW] Ripley Fraise, MD  ? ?                        ?Medical Decision Making ?Amount and/or Complexity of Data Reviewed ?Labs:  Decision-making details documented in ED Course. ? ?Risk ?Prescription drug management. ? ? ?This patient presents to the ED for concern of shoulder pain, this involves an extensive number of treatment options, and is a complaint that carries with it a high risk of complications and morbidity.  The differential diagnosis includes arthritis, septic joint, acute coronary syndrome, muscle strain ? ?Comorbidities that complicate the patient evaluation: ?Patient?s presentation is  complicated by their history of hypertension ? ?Social Determinants of Health: ?Patient?s  lack of transportation   increases the complexity of managing their presentation ? ?LABS ?I Ordered, and personally interpreted labs.  The pertinent results include: Mild hyperglycemia ? ?Imaging Studies ordered: ?I ordered imaging studies including X-ray chest   ?I independently visualized and interpreted imaging which showed no acute findings ?I agree with the radiologist interpretation ? ? ?Medicines ordered and prescription drug management: ?I ordered medication including Vicodin for pain ? ? ?Complexity of problems addressed: ?Patient?s presentation is most consistent with  acute presentation with potential threat to life or bodily function ? ?Disposition: ?After consideration of the diagnostic results and the patient?s response to treatment,   ?I feel that the patent would benefit from discharge   .  ? ?Patient presented with atraumatic bilateral shoulder pain.  No neurodeficits.  No signs of trauma.  X-ray negative. ?Could be referred from neck.  Will

## 2021-08-12 NOTE — ED Notes (Signed)
RN reviewed discharge instructions with pt. Pt verbalized understanding and had no further questions. VSS upon discharge.  

## 2021-09-09 ENCOUNTER — Emergency Department (HOSPITAL_COMMUNITY)
Admission: EM | Admit: 2021-09-09 | Discharge: 2021-09-09 | Disposition: A | Discharge status: Home / Self Care | Payer: Medicare Other | Attending: Emergency Medicine | Admitting: Emergency Medicine

## 2021-09-09 ENCOUNTER — Other Ambulatory Visit: Payer: Self-pay

## 2021-09-09 ENCOUNTER — Emergency Department (HOSPITAL_COMMUNITY): Payer: Medicare Other

## 2021-09-09 DIAGNOSIS — I10 Essential (primary) hypertension: Secondary | ICD-10-CM | POA: Diagnosis not present

## 2021-09-09 DIAGNOSIS — R110 Nausea: Secondary | ICD-10-CM | POA: Insufficient documentation

## 2021-09-09 DIAGNOSIS — R6884 Jaw pain: Secondary | ICD-10-CM | POA: Insufficient documentation

## 2021-09-09 DIAGNOSIS — Z79899 Other long term (current) drug therapy: Secondary | ICD-10-CM | POA: Insufficient documentation

## 2021-09-09 DIAGNOSIS — Z7951 Long term (current) use of inhaled steroids: Secondary | ICD-10-CM | POA: Insufficient documentation

## 2021-09-09 DIAGNOSIS — M79622 Pain in left upper arm: Secondary | ICD-10-CM | POA: Insufficient documentation

## 2021-09-09 DIAGNOSIS — J449 Chronic obstructive pulmonary disease, unspecified: Secondary | ICD-10-CM | POA: Insufficient documentation

## 2021-09-09 LAB — CBC WITH DIFFERENTIAL/PLATELET
Abs Immature Granulocytes: 0.02 10*3/uL (ref 0.00–0.07)
Basophils Absolute: 0.1 10*3/uL (ref 0.0–0.1)
Basophils Relative: 1 %
Eosinophils Absolute: 0.4 10*3/uL (ref 0.0–0.5)
Eosinophils Relative: 4 %
HCT: 40.3 % (ref 36.0–46.0)
Hemoglobin: 12.7 g/dL (ref 12.0–15.0)
Immature Granulocytes: 0 %
Lymphocytes Relative: 27 %
Lymphs Abs: 2.5 10*3/uL (ref 0.7–4.0)
MCH: 30.7 pg (ref 26.0–34.0)
MCHC: 31.5 g/dL (ref 30.0–36.0)
MCV: 97.3 fL (ref 80.0–100.0)
Monocytes Absolute: 0.8 10*3/uL (ref 0.1–1.0)
Monocytes Relative: 9 %
Neutro Abs: 5.6 10*3/uL (ref 1.7–7.7)
Neutrophils Relative %: 59 %
Platelets: 263 10*3/uL (ref 150–400)
RBC: 4.14 MIL/uL (ref 3.87–5.11)
RDW: 11.6 % (ref 11.5–15.5)
WBC: 9.3 10*3/uL (ref 4.0–10.5)
nRBC: 0 % (ref 0.0–0.2)

## 2021-09-09 LAB — BASIC METABOLIC PANEL
Anion gap: 7 (ref 5–15)
BUN: 11 mg/dL (ref 8–23)
CO2: 26 mmol/L (ref 22–32)
Calcium: 9 mg/dL (ref 8.9–10.3)
Chloride: 106 mmol/L (ref 98–111)
Creatinine, Ser: 0.86 mg/dL (ref 0.44–1.00)
GFR, Estimated: >60 mL/min (ref >60)
Glucose, Bld: 114 mg/dL — ABNORMAL HIGH (ref 70–99)
Potassium: 4.2 mmol/L (ref 3.5–5.1)
Sodium: 139 mmol/L (ref 135–145)

## 2021-09-09 LAB — CK: Total CK: 78 U/L (ref 38–234)

## 2021-09-09 LAB — TROPONIN I (HIGH SENSITIVITY): Troponin I (High Sensitivity): 4 ng/L (ref <18)

## 2021-09-09 MED ORDER — ONDANSETRON HCL 4 MG PO TABS
4.0000 mg | ORAL_TABLET | Freq: Three times a day (TID) | ORAL | 0 refills | Status: DC | PRN
Start: 2021-09-09 — End: 2022-05-25

## 2021-09-09 MED ORDER — ONDANSETRON HCL 4 MG/2ML IJ SOLN
4.0000 mg | Freq: Once | INTRAMUSCULAR | Status: AC
  Administered 2021-09-09: 4 mg via INTRAVENOUS
  Filled 2021-09-09: qty 2

## 2021-09-09 MED ORDER — KETOROLAC TROMETHAMINE 15 MG/ML IJ SOLN
15.0000 mg | Freq: Once | INTRAMUSCULAR | Status: AC
Start: 2021-09-09 — End: 2021-09-09
  Administered 2021-09-09: 15 mg via INTRAVENOUS
  Filled 2021-09-09: qty 1

## 2021-09-09 NOTE — ED Provider Notes (Signed)
?[facility] ?Provider Note ? ? ?CSN: [id number] ?Arrival date & time: 09/09/21  0947 ? ?  ? ?History ? ?Chief Complaint  ?Patient presents with  ? Jaw Pain  ? ? ?Ashley Hodge is a 61 y.o. female. ? ?HPI ?61 year old female with a history of hypertension, hypercholesterolemia, COPD, presents with left arm pain.  She has had left upper arm pain for about 2 weeks.  It is much worse this morning since waking up around 6 AM.  She states sometimes when she sleeps on that arm or takes her blood pressure in that arm it is more severe.  It is a sharp pain.  She took some Tylenol without relief.  No swelling in the arm. She also endorses some left lower jaw pain that she states she thinks is coming from a tooth.  That is been an intermittent problem.  No chest pain, shortness of breath or neck pain.  No weakness or numbness in the extremity.  She is feeling nauseated and is almost out of her Zofran and is asking for nausea medicine. ? ?Home Medications ?Prior to Admission medications   ?Medication Sig Start Date End Date Taking? Authorizing Provider  ?acetaminophen (TYLENOL) 500 MG tablet Take 1,000 mg by mouth daily as needed (Pain).   Yes [provider]  ?albuterol (VENTOLIN HFA) 108 (90 Base) MCG/ACT inhaler Inhale 3 puffs into the lungs daily as needed for wheezing or shortness of breath. 04/22/21  Yes [provider]  ?ezetimibe (ZETIA) 10 MG tablet Take 10 mg by mouth at bedtime. 05/11/21  Yes [provider]  ?hydrochlorothiazide (HYDRODIURIL) 12.5 MG tablet Take 12.5 mg by mouth daily. 09/07/17  Yes [provider]  ?losartan (COZAAR) 100 MG tablet Take 100 mg by mouth at bedtime.   Yes [provider]  ?pantoprazole (PROTONIX) 20 MG tablet Take 20 mg by mouth 2 (two) times daily. 05/11/21  Yes [provider]  ?rosuvastatin (CRESTOR) 20 MG tablet Take 20 mg by mouth at bedtime. 05/11/21  Yes [provider]   ?amLODipine (NORVASC) 5 MG tablet Take 5 mg by mouth daily. ?Patient not taking: Reported on 09/09/2021 09/07/17   [provider]  ?Fluticasone-Umeclidin-Vilant (TRELEGY ELLIPTA) 100-62.5-25 MCG/INH AEPB Inhale 1 puff into the lungs daily. ?Patient not taking: Reported on 09/09/2021 04/23/20   Hunsucker, Lesia SagoMatthew R, MD  ?HYDROcodone-acetaminophen (NORCO/VICODIN) 5-325 MG tablet Take 1 tablet by mouth every 6 (six) hours as needed for severe pain. ?Patient not taking: Reported on 09/09/2021 08/12/21   Zadie RhineWickline, Donald, MD  ?losartan (COZAAR) 50 MG tablet Take 50 mg by mouth daily. ?Patient not taking: Reported on 09/09/2021 09/07/17   [provider]  ?methocarbamol (ROBAXIN) 500 MG tablet Take 1 tablet (500 mg total) by mouth 2 (two) times daily. ?Patient not taking: Reported on 09/09/2021 05/22/21   Cristopher PeruAutry, Lauren E, PA-C  ?ondansetron (ZOFRAN) 4 MG tablet Take 1 tablet (4 mg total) by mouth every 8 (eight) hours as needed for nausea or vomiting. 09/09/21   Pricilla LovelessGoldston, Josefita Weissmann, MD  ?potassium chloride (MICRO-K) 10 MEQ CR capsule Take 10 mEq by mouth daily. ?Patient not taking: Reported on 09/09/2021 05/11/21   [provider]  ?pravastatin (PRAVACHOL) 80 MG tablet Take 80 mg by mouth daily. ?Patient not taking: Reported on 09/09/2021 05/21/19   [provider]  ?fluticasone (FLONASE) 50 MCG/ACT nasal spray Place 2 sprays into both nostrils daily. ?Patient not taking: Reported on 12/10/2019 11/04/19 01/31/20  Myra RudeSchmitz, Jeremy E, MD  ?  ipratropium-albuterol (DUONEB) 0.5-2.5 (3) MG/3ML SOLN Take 3 mLs by nebulization every 6 (six) hours as needed (Wheezing and SOB). ?Patient not taking: Reported on 12/25/2019 12/11/19 01/31/20  Merlene Laughter, DO  ?   ? ?Allergies    ?Lisinopril   ? ?Review of Systems   ?Review of Systems  ?Respiratory:  Negative for shortness of breath.   ?Cardiovascular:  Negative for chest pain.  ?Gastrointestinal:  Positive for nausea.  ?Musculoskeletal:  Positive for myalgias.  Negative for joint swelling and neck pain.  ?Neurological:  Negative for weakness and numbness.  ? ?Physical Exam ?Updated Vital Signs ?BP 108/66   Pulse 80   Temp 98.5 ?F (36.9 ?C) (Oral)   Resp 13   SpO2 95%  ?Physical Exam ?Vitals and nursing note reviewed.  ?Constitutional:   ?   Appearance: She is well-developed.  ?HENT:  ?   Head: Normocephalic and atraumatic.  ?   Mouth/Throat:  ?   Comments: Overall poor dentition but no obvious dental infection/abscess ?No mandibular tenderness/swelling or anterior neck swelling/tenderness ?Neck:  ? ?Cardiovascular:  ?   Rate and Rhythm: Normal rate and regular rhythm.  ?   Pulses:     ?     Radial pulses are 2+ on the left side.  ?   Heart sounds: Normal heart sounds.  ?Pulmonary:  ?   Effort: Pulmonary effort is normal.  ?   Breath sounds: Normal breath sounds.  ?Abdominal:  ?   General: There is no distension.  ?Musculoskeletal:  ?   Cervical back: Spinous process tenderness and muscular tenderness present.  ?   Comments: Mild tenderness to the left upper arm. No swelling or skin color change appreciated. Normal strength/sensation in LUE.   ?Skin: ?   General: Skin is warm and dry.  ?Neurological:  ?   Mental Status: She is alert.  ? ? ?ED Results / Procedures / Treatments   ?Labs ?(all labs ordered are listed, but only abnormal results are displayed) ?Labs Reviewed  ?BASIC METABOLIC PANEL - Abnormal; Notable for the following components:  ?    Result Value  ? Glucose, Bld 114 (*)   ? All other components within normal limits  ?CBC WITH DIFFERENTIAL/PLATELET  ?CK  ?TROPONIN I (HIGH SENSITIVITY)  ? ? ?EKG ?EKG Interpretation ? ?Date/Time:  Thursday September 09 2021 10:55:58 EDT ?Ventricular Rate:  73 ?PR Interval:  147 ?QRS Duration: 90 ?QT Interval:  390 ?QTC Calculation: 430 ?R Axis:   54 ?Text Interpretation: Sinus rhythm Low voltage, precordial leads no acute ST/T changes similar to Mar 2023 Confirmed by Pricilla Loveless [phone]) on 09/09/2021 10:59:19  AM ? ?Radiology ?DG Cervical Spine Complete ? ?Result Date: 09/09/2021 ?CLINICAL DATA:  Neck and left arm pain for 1 week EXAM: CERVICAL SPINE - COMPLETE 4+ VIEW COMPARISON:  02/07/2018 FINDINGS: Normal alignment and prevertebral soft tissues. Similar moderate degenerative changes at C4 through C7 with disc space narrowing, sclerosis and osteophytes. Multilevel posterior facet arthropathy. Facets appear aligned. Intact odontoid. Included chest demonstrates no acute finding. IMPRESSION: Lower cervical degenerative changes from C4-C7. No acute abnormality by plain radiography Electronically Signed   By: Judie Petit.  Shick M.D.   On: 09/09/2021 11:37  ? ?DG Humerus Left ? ?Result Date: 09/09/2021 ?CLINICAL DATA:  Left arm pain EXAM: LEFT HUMERUS - 2+ VIEW COMPARISON:  None. FINDINGS: There is no evidence of fracture or other focal bone lesions. Soft tissues are unremarkable. IMPRESSION: Negative. Electronically Signed   By: Judie Petit.  Shick M.D.  On: 09/09/2021 11:35   ? ?Procedures ?Procedures  ? ? ?Medications Ordered in ED ?Medications  ?ketorolac (TORADOL) 15 MG/ML injection 15 mg (15 mg Intravenous Given 09/09/21 1051)  ?ondansetron (ZOFRAN) injection 4 mg (4 mg Intravenous Given 09/09/21 1051)  ? ? ?ED Course/ Medical Decision Making/ A&P ?  ?                        ?Medical Decision Making ?Amount and/or Complexity of Data Reviewed ?External Data Reviewed: notes. ?Labs: ordered. ?Radiology: ordered and independent interpretation performed. ?ECG/medicine tests: ordered and independent interpretation performed. ? ?Risk ?Prescription drug management. ? ? ?Patient presents with left arm pain.  I doubt this is atypical ACS.  Given how long its been going on I think 1 troponin is sufficient to help rule out MI.  She appears well.  Is feeling better after Toradol and Zofran.  We will refill her Zofran at her request but otherwise I think is reasonable to discharge home and follow-up with PCP.  Likely muscular.  No swelling to suggest  that this is likely to be DVT and need an ultrasound.  Very low suspicion this is ACS, PE, dissection, neurologic dysfunction.  Good radial pulse.  Normal neuro exam.  Will discharge home with recommendation for NSAIDs and Tylenol and pote

## 2021-09-09 NOTE — ED Triage Notes (Signed)
Pt c/o of jaw pain and left sided arm pain x 1 week. Pt seen at [facility] on 3/29 for same. ? ? ?HX: HTN  ?

## 2021-09-09 NOTE — Discharge Instructions (Signed)
Take ibuprofen and/or Tylenol for pain.  If you develop chest pain, weakness or numbness in the arm, swelling to the arm, skin color changes, or any other new/concerning symptoms then return ?

## 2021-10-11 ENCOUNTER — Emergency Department (HOSPITAL_COMMUNITY)
Admission: EM | Admit: 2021-10-11 | Discharge: 2021-10-12 | Disposition: A | Discharge status: Home / Self Care | Payer: Medicare Other | Attending: Emergency Medicine | Admitting: Emergency Medicine

## 2021-10-11 ENCOUNTER — Other Ambulatory Visit: Payer: Self-pay

## 2021-10-11 ENCOUNTER — Encounter (HOSPITAL_COMMUNITY): Payer: Self-pay

## 2021-10-11 DIAGNOSIS — M5412 Radiculopathy, cervical region: Secondary | ICD-10-CM | POA: Diagnosis not present

## 2021-10-11 DIAGNOSIS — M79605 Pain in left leg: Secondary | ICD-10-CM | POA: Diagnosis present

## 2021-10-11 DIAGNOSIS — M5416 Radiculopathy, lumbar region: Secondary | ICD-10-CM | POA: Diagnosis not present

## 2021-10-11 NOTE — ED Triage Notes (Signed)
Pt reports with left upper arm pain and left leg pain x 1 week.

## 2021-10-11 NOTE — ED Notes (Signed)
Pt states her chest just began to hurt. EKG ordered

## 2021-10-12 MED ORDER — PREDNISONE 20 MG PO TABS: 40.0000 mg | ORAL_TABLET | Freq: Every day | ORAL | 0 refills | Status: DC

## 2021-10-12 MED ORDER — METHOCARBAMOL 500 MG PO TABS: 500.0000 mg | ORAL_TABLET | Freq: Two times a day (BID) | ORAL | 0 refills | Status: DC

## 2021-10-12 MED ORDER — ACETAMINOPHEN 325 MG PO TABS
650.0000 mg | ORAL_TABLET | Freq: Once | ORAL | Status: AC
  Administered 2021-10-12: 650 mg via ORAL
  Filled 2021-10-12: qty 2

## 2021-10-12 NOTE — ED Provider Notes (Signed)
[facility]-EMERGENCY DEPT [facility] Emergency Department Provider Note MRN:  [id number]  Arrival date & time: 10/12/21     Chief Complaint   Arm Pain and Leg Pain   History of Present Illness   Ashley Hodge is a 61 y.o. year-old female presents to the ED with chief complaint of left arm and left leg pain.  She states that this has been going on for quite some time.  She feels that the pain radiates down her left arm and her left leg.  She denies any injuries.  She states that the symptoms are worsened when she leans or sleeps on her arm or on her side.  She denies any successful treatments prior to arrival.  Has tried taking Tylenol.Marland Kitchen  History provided by patient.   Review of Systems  Pertinent review of systems noted in HPI.    Physical Exam   Vitals:   10/11/21 2219  BP: 129/66  Pulse: (!) 107  Resp: 16  Temp: 98.7 F (37.1 C)  SpO2: 94%    CONSTITUTIONAL:  well-appearing, NAD NEURO:  Alert and oriented x 3, CN 3-12 grossly intact EYES:  eyes equal and reactive ENT/NECK:  Supple, no stridor  CARDIO:  appears well-perfused  PULM:  No respiratory distress, intact distal pulses GI/GU:  non-distended,  MSK/SPINE:  No gross deformities, no edema, decreased range of motion of left arm overhead SKIN:  no rash, atraumatic, skin is warm and dry, no sign of infection   *Additional and/or pertinent findings included in MDM below  Diagnostic and Interventional Summary    EKG Interpretation  Date/Time:  Monday Oct 11 2021 23:13:50 EDT Ventricular Rate:  90 PR Interval:  136 QRS Duration: 98 QT Interval:  359 QTC Calculation: 440 R Axis:   43 Text Interpretation: Sinus rhythm Low voltage, precordial leads No significant change since last tracing Confirmed by Susy Frizzle [phone]) on 10/11/2021 11:33:05 PM       Labs Reviewed - No data to display  No orders to display    Medications  acetaminophen (TYLENOL) tablet 650 mg (650 mg Oral Given 10/12/21 0131)      Procedures  /  Critical Care Procedures  ED Course and Medical Decision Making  I have reviewed the triage vital signs, the nursing notes, and pertinent available records from the EMR.  Social Determinants Affecting Complexity of Care: Patient has no clinically significant social determinants affecting this chief complaint..   ED Course:   Patient here with ongoing left arm and left leg pain.  Top differential diagnoses include radicular pain, chronic pain, arthritis. Medical Decision Making Patient here with left arm and left leg pain.  Symptoms seem more consistent with radiculopathy.  She does not have weakness.  Recent x-ray from [facility] reviewed and shows degenerative changes of the cervical spine.  Problems Addressed: Cervical radiculopathy: chronic illness or injury Lumbar radiculopathy: chronic illness or injury     Consultants: No consultations were needed in caring for this patient.   Treatment and Plan: We will trial short course of prednisone and recommend orthopedic/sports medicine/physical therapy follow-up.  Emergency department workup does not suggest an emergent condition requiring admission or immediate intervention beyond  what has been performed at this time. The patient is safe for discharge and has  been instructed to return immediately for worsening symptoms, change in  symptoms or any other concerns    Final Clinical Impressions(s) / ED Diagnoses     ICD-10-CM   1. Cervical radiculopathy  M54.12  2. Lumbar radiculopathy  M54.16       ED Discharge Orders          Ordered    predniSONE (DELTASONE) 20 MG tablet  Daily        10/12/21 0119    methocarbamol (ROBAXIN) 500 MG tablet  2 times daily        10/12/21 0119              Discharge Instructions Discussed with and Provided to Patient:   Discharge Instructions   None      Roxy Horseman, PA-C 10/12/21 0145    Pollyann Savoy, MD 10/12/21 [phone]

## 2021-10-14 ENCOUNTER — Encounter (HOSPITAL_COMMUNITY): Payer: Self-pay

## 2021-10-14 ENCOUNTER — Emergency Department (HOSPITAL_COMMUNITY): Payer: Medicare Other

## 2021-10-14 ENCOUNTER — Emergency Department (HOSPITAL_COMMUNITY)
Admission: EM | Admit: 2021-10-14 | Discharge: 2021-10-14 | Disposition: A | Discharge status: Home / Self Care | Payer: Medicare Other | Attending: Emergency Medicine | Admitting: Emergency Medicine

## 2021-10-14 DIAGNOSIS — I10 Essential (primary) hypertension: Secondary | ICD-10-CM | POA: Insufficient documentation

## 2021-10-14 DIAGNOSIS — M79602 Pain in left arm: Secondary | ICD-10-CM | POA: Insufficient documentation

## 2021-10-14 DIAGNOSIS — Z79899 Other long term (current) drug therapy: Secondary | ICD-10-CM | POA: Diagnosis not present

## 2021-10-14 DIAGNOSIS — Y93K1 Activity, walking an animal: Secondary | ICD-10-CM | POA: Diagnosis not present

## 2021-10-14 DIAGNOSIS — W1839XA Other fall on same level, initial encounter: Secondary | ICD-10-CM | POA: Diagnosis not present

## 2021-10-14 DIAGNOSIS — Z7952 Long term (current) use of systemic steroids: Secondary | ICD-10-CM | POA: Insufficient documentation

## 2021-10-14 DIAGNOSIS — J449 Chronic obstructive pulmonary disease, unspecified: Secondary | ICD-10-CM | POA: Diagnosis not present

## 2021-10-14 MED ORDER — NAPROXEN 500 MG PO TABS: 500.0000 mg | ORAL_TABLET | Freq: Two times a day (BID) | ORAL | 0 refills | Status: AC

## 2021-10-14 MED ORDER — OXYCODONE-ACETAMINOPHEN 5-325 MG PO TABS
1.0000 | ORAL_TABLET | Freq: Once | ORAL | Status: AC
  Administered 2021-10-14: 1 via ORAL
  Filled 2021-10-14: qty 1

## 2021-10-14 NOTE — ED Provider Notes (Signed)
[facility]-EMERGENCY DEPT Provider Note   CSN: [id number] Arrival date & time: 10/14/21  0729     History PMH: HTN, HLD, COPD, Prediabetes  Chief Complaint  Patient presents with   Arm Injury    Ashley SquiresKathy D Kertesz is a 61 y.o. female. Presents to the ED with left arm pain.  She says she was walking her dog today when she had the leash in her left hand.  She says the dog saw another dog and ran.  She got her left arm caught by a telephone pole and fell to the ground.  She has significant pain on the dorsal aspect of her left forearm.  Pain shoots up all the way into her left shoulder.  She rates it 10 out of 10 in intensity.  She denies any numbness or tingling.  Arm Injury     Home Medications Prior to Admission medications   Medication Sig Start Date End Date Taking? Authorizing Provider  naproxen (NAPROSYN) 500 MG tablet Take 1 tablet (500 mg total) by mouth 2 (two) times daily for 5 days. 10/14/21 10/19/21 Yes Sharief Wainwright, Finis BudGrace C, PA-C  acetaminophen (TYLENOL) 500 MG tablet Take 1,000 mg by mouth daily as needed (Pain).    [provider]  albuterol (VENTOLIN HFA) 108 (90 Base) MCG/ACT inhaler Inhale 3 puffs into the lungs daily as needed for wheezing or shortness of breath. 04/22/21   [provider]  amLODipine (NORVASC) 5 MG tablet Take 5 mg by mouth daily. Patient not taking: Reported on 09/09/2021 09/07/17   [provider]  ezetimibe (ZETIA) 10 MG tablet Take 10 mg by mouth at bedtime. 05/11/21   [provider]  Fluticasone-Umeclidin-Vilant (TRELEGY ELLIPTA) 100-62.5-25 MCG/INH AEPB Inhale 1 puff into the lungs daily. Patient not taking: Reported on 09/09/2021 04/23/20   Hunsucker, Lesia SagoMatthew R, MD  hydrochlorothiazide (HYDRODIURIL) 12.5 MG tablet Take 12.5 mg by mouth daily. 09/07/17   [provider]  HYDROcodone-acetaminophen (NORCO/VICODIN) 5-325 MG tablet Take 1 tablet by mouth every 6 (six) hours as needed for severe  pain. Patient not taking: Reported on 09/09/2021 08/12/21   Zadie RhineWickline, Donald, MD  losartan (COZAAR) 100 MG tablet Take 100 mg by mouth at bedtime.    [provider]  losartan (COZAAR) 50 MG tablet Take 50 mg by mouth daily. Patient not taking: Reported on 09/09/2021 09/07/17   [provider]  methocarbamol (ROBAXIN) 500 MG tablet Take 1 tablet (500 mg total) by mouth 2 (two) times daily. 10/12/21   Roxy HorsemanBrowning, Robert, PA-C  ondansetron (ZOFRAN) 4 MG tablet Take 1 tablet (4 mg total) by mouth every 8 (eight) hours as needed for nausea or vomiting. 09/09/21   Pricilla LovelessGoldston, Scott, MD  pantoprazole (PROTONIX) 20 MG tablet Take 20 mg by mouth 2 (two) times daily. 05/11/21   [provider]  potassium chloride (MICRO-K) 10 MEQ CR capsule Take 10 mEq by mouth daily. Patient not taking: Reported on 09/09/2021 05/11/21   [provider]  pravastatin (PRAVACHOL) 80 MG tablet Take 80 mg by mouth daily. Patient not taking: Reported on 09/09/2021 05/21/19   [provider]  predniSONE (DELTASONE) 20 MG tablet Take 2 tablets (40 mg total) by mouth daily. 10/12/21   Roxy HorsemanBrowning, Robert, PA-C  rosuvastatin (CRESTOR) 20 MG tablet Take 20 mg by mouth at bedtime. 05/11/21   [provider]  fluticasone (FLONASE) 50 MCG/ACT nasal spray Place 2 sprays into both nostrils daily. Patient not taking: Reported on 12/10/2019 11/04/19 01/31/20  Clare GandySchmitz, Jeremy  E, MD  ipratropium-albuterol (DUONEB) 0.5-2.5 (3) MG/3ML SOLN Take 3 mLs by nebulization every 6 (six) hours as needed (Wheezing and SOB). Patient not taking: Reported on 12/25/2019 12/11/19 01/31/20  Marguerita Merles Latif, DO      Allergies    Lisinopril    Review of Systems   Review of Systems  Musculoskeletal:  Positive for arthralgias.  All other systems reviewed and are negative.  Physical Exam Updated Vital Signs BP (!) 152/69 (BP Location: Right Arm)   Pulse 92   Temp 98.1 F (36.7 C) (Oral)   Resp 17   SpO2 98%   Physical Exam Vitals and nursing note reviewed.  Constitutional:      General: She is not in acute distress.    Appearance: Normal appearance. She is well-developed. She is not ill-appearing, toxic-appearing or diaphoretic.  HENT:     Head: Normocephalic and atraumatic.     Nose: No nasal deformity.     Mouth/Throat:     Lips: Pink. No lesions.  Eyes:     General: Gaze aligned appropriately. No scleral icterus.       Right eye: No discharge.        Left eye: No discharge.     Conjunctiva/sclera: Conjunctivae normal.     Right eye: Right conjunctiva is not injected. No exudate or hemorrhage.    Left eye: Left conjunctiva is not injected. No exudate or hemorrhage. Pulmonary:     Effort: Pulmonary effort is normal. No respiratory distress.  Musculoskeletal:     Comments: LUE:  -No tenderness to palpation of the left shoulder.  She is able to range this joint in all directions. - No stepoffs or deformities of the left humerus - She does have reproducible tenderness to the left elbow but she is able to flex and extend this fully and has normal strength. - Severe tenderness to palpation of the left forearm with just light touch. Swelling present on the palmar aspect of the proximal fore arm - No obvious wrist deformity, but she is unable to range this joint.  - Also having difficulty moving all 5 fingers - Sensation is intact - Radial pulse is 2 + and equal bilaterally  Skin:    General: Skin is warm and dry.  Neurological:     Mental Status: She is alert and oriented to person, place, and time.  Psychiatric:        Mood and Affect: Mood normal.        Speech: Speech normal.        Behavior: Behavior normal. Behavior is cooperative.    ED Results / Procedures / Treatments   Labs (all labs ordered are listed, but only abnormal results are displayed) Labs Reviewed - No data to display  EKG None  Radiology DG Elbow Complete Left  Result Date: 10/14/2021 CLINICAL DATA:  Left  elbow pain after injury today. EXAM: LEFT ELBOW - COMPLETE 3+ VIEW COMPARISON:  None Available. FINDINGS: There is no evidence of fracture, dislocation, or joint effusion. There is no evidence of arthropathy or other focal bone abnormality. Soft tissues are unremarkable. IMPRESSION: Negative. Electronically Signed   By: Lupita Raider M.D.   On: 10/14/2021 08:35   DG Forearm Left  Result Date: 10/14/2021 CLINICAL DATA:  Left arm pain after injury today. EXAM: LEFT FOREARM - 2 VIEW COMPARISON:  None Available. FINDINGS: There is no evidence of fracture or other focal bone lesions. Soft tissues are unremarkable. IMPRESSION: Negative. Electronically Signed  By: Lupita Raider M.D.   On: 10/14/2021 08:37   DG Wrist Complete Left  Result Date: 10/14/2021 CLINICAL DATA:  Left wrist pain after injury today. EXAM: LEFT WRIST - COMPLETE 3+ VIEW COMPARISON:  None Available. FINDINGS: There is no evidence of fracture or dislocation. There is no evidence of arthropathy or other focal bone abnormality. Soft tissues are unremarkable. IMPRESSION: Negative. Electronically Signed   By: Lupita Raider M.D.   On: 10/14/2021 08:36    Procedures Procedures   Medications Ordered in ED Medications  oxyCODONE-acetaminophen (PERCOCET/ROXICET) 5-325 MG per tablet 1 tablet (1 tablet Oral Given 10/14/21 1025)    ED Course/ Medical Decision Making/ A&P                           Medical Decision Making Amount and/or Complexity of Data Reviewed Radiology: ordered.  Risk Prescription drug management.   Presents after injuring her left forearm while walking her dog.  She does have some swelling on the palmar aspect of the left forearm as well as severe reproducible tenderness to even light touch.  She has impaired range of motion of her wrist and fingers.  She is neurovascularly intact. We will start with plain imaging of the elbow, wrist, and forearm Pain treated here I personally reviewed imaging and read  radiologist interpretation. I see no evidence of fracture or dislocation. I agree with radiologist read I think pain is likely caused by soft tissue swelling compressing on the ulnar/radial nerve. Pain seems consistent with nerve pain. I recommend wrist brace, antiinflammatories, and f/u with orthopedics.   Final Clinical Impression(s) / ED Diagnoses Final diagnoses:  Left arm pain    Rx / DC Orders ED Discharge Orders          Ordered    naproxen (NAPROSYN) 500 MG tablet  2 times daily        10/14/21 0919              Claudie Leach, PA-C 10/14/21 0934    Terrilee Files, MD 10/14/21 [phone]

## 2021-10-14 NOTE — Discharge Instructions (Addendum)
Your XR today was normal, but you may have some swelling over one of the nerves in your arm that is causing your symptoms. This should get better with time, but I recommend trying the antiinflammatory medication that I have prescribed.  I have prescribed you 5 days of Naproxen that you should take as prescribed.  Please call Orthopedics and schedule a follow up appointment.

## 2021-10-14 NOTE — ED Triage Notes (Signed)
Pt arrived via POV, c/o left arm pain after dog "jerked me walking" her into telephone pole this morning while out for a walk this morning.

## 2021-10-26 ENCOUNTER — Ambulatory Visit
Admission: RE | Admit: 2021-10-26 | Discharge: 2021-10-26 | Disposition: A | Discharge status: Home / Self Care | Payer: Medicare Other | Source: Ambulatory Visit | Attending: Family Medicine | Admitting: Family Medicine

## 2021-10-26 ENCOUNTER — Other Ambulatory Visit: Payer: Self-pay | Admitting: Family Medicine

## 2021-10-26 DIAGNOSIS — Z9181 History of falling: Secondary | ICD-10-CM

## 2021-10-26 DIAGNOSIS — M25512 Pain in left shoulder: Secondary | ICD-10-CM

## 2021-11-16 ENCOUNTER — Emergency Department (HOSPITAL_COMMUNITY)
Admission: EM | Admit: 2021-11-16 | Discharge: 2021-11-16 | Disposition: A | Discharge status: Home / Self Care | Payer: Medicare Other | Attending: Emergency Medicine | Admitting: Emergency Medicine

## 2021-11-16 ENCOUNTER — Other Ambulatory Visit: Payer: Self-pay

## 2021-11-16 ENCOUNTER — Encounter (HOSPITAL_COMMUNITY): Payer: Self-pay

## 2021-11-16 DIAGNOSIS — T679XXA Effect of heat and light, unspecified, initial encounter: Secondary | ICD-10-CM | POA: Diagnosis present

## 2021-11-16 DIAGNOSIS — X30XXXA Exposure to excessive natural heat, initial encounter: Secondary | ICD-10-CM | POA: Diagnosis not present

## 2021-11-16 NOTE — ED Triage Notes (Signed)
[place] from [place] for [address], pt reports she got hot, but was drinking water, [employer] called EMS, though pt refused. Pt reports feeling fine, did not want to come in but [employer] insisted. Pt A&O x4, temp 98.8. Pt has no complaints.

## 2021-11-16 NOTE — ED Provider Notes (Signed)
[facility]-EMERGENC[facility] DEPT Provider Note   CSN: [id number] Arrival date & time: 11/16/21  1941     History  Chief Complaint  Patient presents with   Heat Exposure    Ashley Hodge is a 61 y.o. female.  Presented to the emergency department due to concern for heat exposure.  Patient reports that she was at a festival downtown [place] when she felt very hot, made a statement out loud about her feeling.  Police officer approached patient to check on her to see how she was feeling.  EMS was contacted and evaluated patient and they advised coming to ER for eval.  Patient states that after drinking some water she felt like her symptoms resolved.  Since being in air conditioning she feels much better and has no ongoing complaints right now.  Denies any episodes of passing out or feeling lightheadedness.  HPI     Home Medications Prior to Admission medications   Medication Sig Start Date End Date Taking? Authorizing Provider  acetaminophen (TYLENOL) 500 MG tablet Take 1,000 mg by mouth daily as needed (Pain).    [provider]  albuterol (VENTOLIN HFA) 108 (90 Base) MCG/ACT inhaler Inhale 3 puffs into the lungs daily as needed for wheezing or shortness of breath. 04/22/21   [provider]  amLODipine (NORVASC) 5 MG tablet Take 5 mg by mouth daily. Patient not taking: Reported on 09/09/2021 09/07/17   [provider]  ezetimibe (ZETIA) 10 MG tablet Take 10 mg by mouth at bedtime. 05/11/21   [provider]  Fluticasone-Umeclidin-Vilant (TRELEGY ELLIPTA) 100-62.5-25 MCG/INH AEPB Inhale 1 puff into the lungs daily. Patient not taking: Reported on 09/09/2021 04/23/20   Hunsucker, Lesia Sago, MD  hydrochlorothiazide (HYDRODIURIL) 12.5 MG tablet Take 12.5 mg by mouth daily. 09/07/17   [provider]  HYDROcodone-acetaminophen (NORCO/VICODIN) 5-325 MG tablet Take 1 tablet by mouth every 6 (six) hours as needed for severe pain. Patient  not taking: Reported on 09/09/2021 08/12/21   Zadie Rhine, MD  losartan (COZAAR) 100 MG tablet Take 100 mg by mouth at bedtime.    [provider]  losartan (COZAAR) 50 MG tablet Take 50 mg by mouth daily. Patient not taking: Reported on 09/09/2021 09/07/17   [provider]  methocarbamol (ROBAXIN) 500 MG tablet Take 1 tablet (500 mg total) by mouth 2 (two) times daily. 10/12/21   Roxy Horseman, PA-C  ondansetron (ZOFRAN) 4 MG tablet Take 1 tablet (4 mg total) by mouth every 8 (eight) hours as needed for nausea or vomiting. 09/09/21   Pricilla Loveless, MD  pantoprazole (PROTONIX) 20 MG tablet Take 20 mg by mouth 2 (two) times daily. 05/11/21   [provider]  potassium chloride (MICRO-K) 10 MEQ CR capsule Take 10 mEq by mouth daily. Patient not taking: Reported on 09/09/2021 05/11/21   [provider]  pravastatin (PRAVACHOL) 80 MG tablet Take 80 mg by mouth daily. Patient not taking: Reported on 09/09/2021 05/21/19   [provider]  predniSONE (DELTASONE) 20 MG tablet Take 2 tablets (40 mg total) by mouth daily. 10/12/21   Roxy Horseman, PA-C  rosuvastatin (CRESTOR) 20 MG tablet Take 20 mg by mouth at bedtime. 05/11/21   [provider]  fluticasone (FLONASE) 50 MCG/ACT nasal spray Place 2 sprays into both nostrils daily. Patient not taking: Reported on 12/10/2019 11/04/19 01/31/20  Myra Rude, MD  ipratropium-albuterol (DUONEB) 0.5-2.5 (3) MG/3ML SOLN Take 3 mLs by nebulization every 6 (six) hours as needed (  Wheezing and SOB). Patient not taking: Reported on 12/25/2019 12/11/19 01/31/20  Marguerita Merles Latif, DO      Allergies    Lisinopril    Review of Systems   Review of Systems  Constitutional:  Negative for chills and fever.  HENT:  Negative for ear pain and sore throat.   Eyes:  Negative for pain and visual disturbance.  Respiratory:  Negative for cough and shortness of breath.   Cardiovascular:  Negative for chest pain and  palpitations.  Gastrointestinal:  Negative for abdominal pain and vomiting.  Genitourinary:  Negative for dysuria and hematuria.  Musculoskeletal:  Negative for arthralgias and back pain.  Skin:  Negative for color change and rash.  Neurological:  Negative for seizures and syncope.  All other systems reviewed and are negative.   Physical Exam Updated Vital Signs BP 124/68   Pulse 97   Temp 98.8 F (37.1 C) (Oral)   Resp 17   Ht 5' (1.524 m)   Wt 72.6 kg   SpO2 94%   BMI 31.25 kg/m  Physical Exam Vitals and nursing note reviewed.  Constitutional:      General: She is not in acute distress.    Appearance: She is well-developed.  HENT:     Head: Normocephalic and atraumatic.  Eyes:     Conjunctiva/sclera: Conjunctivae normal.  Cardiovascular:     Rate and Rhythm: Normal rate and regular rhythm.     Heart sounds: No murmur heard. Pulmonary:     Effort: Pulmonary effort is normal. No respiratory distress.     Breath sounds: Normal breath sounds.  Abdominal:     Palpations: Abdomen is soft.     Tenderness: There is no abdominal tenderness.  Musculoskeletal:        General: No swelling.     Cervical back: Neck supple.  Skin:    General: Skin is warm and dry.     Capillary Refill: Capillary refill takes less than 2 seconds.  Neurological:     Mental Status: She is alert.  Psychiatric:        Mood and Affect: Mood normal.     ED Results / Procedures / Treatments   Labs (all labs ordered are listed, but only abnormal results are displayed) Labs Reviewed - No data to display  EKG None  Radiology No results found.  Procedures Procedures    Medications Ordered in ED Medications - No data to display  ED Course/ Medical Decision Making/ A&P                           Medical Decision Making  61 year old lady presenting to ER due to concern for feeling hot earlier today.  She was outside for a while today, temperatures outside today in low 90s.  Patient  states that her symptoms resolved after drinking some water and being in air conditioning.  She now has no ongoing complaints and feels fine.  As patient has no ongoing symptoms and normal vital signs, feel she can be discharged and follow-up with her primary care doctor.  Do not see any need for blood work or other testing right now.     After the discussed management above, the patient was determined to be safe for discharge.  The patient was in agreement with this plan and all questions regarding their care were answered.  ED return precautions were discussed and the patient will return to the ED with any significant worsening  of condition.         Final Clinical Impression(s) / ED Diagnoses Final diagnoses:  Heat exposure, initial encounter    Rx / DC Orders ED Discharge Orders     None         Milagros Loll, MD 11/16/21 2020

## 2021-11-16 NOTE — Discharge Instructions (Addendum)
Follow-up with your regular doctor.  Come back to ER as needed if you develop any new concerning symptoms.  Drink plenty of fluids.

## 2021-11-22 ENCOUNTER — Emergency Department (HOSPITAL_COMMUNITY)
Admission: EM | Admit: 2021-11-22 | Discharge: 2021-11-22 | Disposition: A | Discharge status: Home / Self Care | Payer: Medicare Other | Attending: Emergency Medicine | Admitting: Emergency Medicine

## 2021-11-22 ENCOUNTER — Other Ambulatory Visit: Payer: Self-pay

## 2021-11-22 ENCOUNTER — Encounter (HOSPITAL_COMMUNITY): Payer: Self-pay | Admitting: Emergency Medicine

## 2021-11-22 DIAGNOSIS — L944 Gottron's papules: Secondary | ICD-10-CM | POA: Insufficient documentation

## 2021-11-22 DIAGNOSIS — Z79899 Other long term (current) drug therapy: Secondary | ICD-10-CM | POA: Insufficient documentation

## 2021-11-22 DIAGNOSIS — R238 Other skin changes: Secondary | ICD-10-CM

## 2021-11-22 NOTE — ED Provider Notes (Signed)
[facility]-EM[facility]ENCY DEPT Provider Note   CSN: [id number] Arrival date & time: 11/22/21  1750     History  Chief Complaint  Patient presents with   Acne    Ashley Hodge is a 61 y.o. female.  HPI  Patient is a 61 year old female presented emergency room today with complaints of lump underneath her right lower eyelid on her right cheek.  She states that she has no blurry vision double vision or eye pain.  She states that approximately 5 days ago she spent several hours out She states that every 6 hours total and states that she developed somewhat of a sunburn on her face.  She states that approximately 24 hours later she developed a small bump on her right cheek.  No other symptoms.  No fevers chills cough congestion no purulence from this area.  She has not noticed that she has developed any other symptoms.  She is taken no medications prior to arrival      Home Medications Prior to Admission medications   Medication Sig Start Date End Date Taking? Authorizing Provider  acetaminophen (TYLENOL) 500 MG tablet Take 1,000 mg by mouth daily as needed (Pain).    [provider]  albuterol (VENTOLIN HFA) 108 (90 Base) MCG/ACT inhaler Inhale 3 puffs into the lungs daily as needed for wheezing or shortness of breath. 04/22/21   [provider]  amLODipine (NORVASC) 5 MG tablet Take 5 mg by mouth daily. Patient not taking: Reported on 09/09/2021 09/07/17   [provider]  ezetimibe (ZETIA) 10 MG tablet Take 10 mg by mouth at bedtime. 05/11/21   [provider]  Fluticasone-Umeclidin-Vilant (TRELEGY ELLIPTA) 100-62.5-25 MCG/INH AEPB Inhale 1 puff into the lungs daily. Patient not taking: Reported on 09/09/2021 04/23/20   Hunsucker, Lesia Sago, MD  hydrochlorothiazide (HYDRODIURIL) 12.5 MG tablet Take 12.5 mg by mouth daily. 09/07/17   [provider]  HYDROcodone-acetaminophen (NORCO/VICODIN) 5-325 MG tablet Take 1 tablet by mouth  every 6 (six) hours as needed for severe pain. Patient not taking: Reported on 09/09/2021 08/12/21   Zadie Rhine, MD  losartan (COZAAR) 100 MG tablet Take 100 mg by mouth at bedtime.    [provider]  losartan (COZAAR) 50 MG tablet Take 50 mg by mouth daily. Patient not taking: Reported on 09/09/2021 09/07/17   [provider]  methocarbamol (ROBAXIN) 500 MG tablet Take 1 tablet (500 mg total) by mouth 2 (two) times daily. 10/12/21   Roxy Horseman, PA-C  ondansetron (ZOFRAN) 4 MG tablet Take 1 tablet (4 mg total) by mouth every 8 (eight) hours as needed for nausea or vomiting. 09/09/21   Pricilla Loveless, MD  pantoprazole (PROTONIX) 20 MG tablet Take 20 mg by mouth 2 (two) times daily. 05/11/21   [provider]  potassium chloride (MICRO-K) 10 MEQ CR capsule Take 10 mEq by mouth daily. Patient not taking: Reported on 09/09/2021 05/11/21   [provider]  pravastatin (PRAVACHOL) 80 MG tablet Take 80 mg by mouth daily. Patient not taking: Reported on 09/09/2021 05/21/19   [provider]  predniSONE (DELTASONE) 20 MG tablet Take 2 tablets (40 mg total) by mouth daily. 10/12/21   Roxy Horseman, PA-C  rosuvastatin (CRESTOR) 20 MG tablet Take 20 mg by mouth at bedtime. 05/11/21   [provider]  fluticasone (FLONASE) 50 MCG/ACT nasal spray Place 2 sprays into both nostrils daily. Patient not taking: Reported on 12/10/2019 11/04/19 01/31/20  Myra Rude, MD  ipratropium-albuterol (DUONEB)  0.5-2.5 (3) MG/3ML SOLN Take 3 mLs by nebulization every 6 (six) hours as needed (Wheezing and SOB). Patient not taking: Reported on 12/25/2019 12/11/19 01/31/20  Marguerita Merles Latif, DO      Allergies    Lisinopril    Review of Systems   Review of Systems  Physical Exam Updated Vital Signs BP (!) 157/83 (BP Location: Left Arm)   Pulse (!) 104   Temp 98.8 F (37.1 C) (Oral)   Resp 16   SpO2 97%  Physical Exam Vitals and nursing note reviewed.   Constitutional:      General: She is not in acute distress.    Appearance: Normal appearance. She is not ill-appearing.  HENT:     Head: Normocephalic and atraumatic.  Eyes:     General: No scleral icterus.       Right eye: No discharge.        Left eye: No discharge.     Conjunctiva/sclera: Conjunctivae normal.     Comments: EOMI  Pulmonary:     Effort: Pulmonary effort is normal.     Breath sounds: No stridor.  Skin:    Comments: Small 5 mm or less papule to right cheek.  No surrounding erythema or purulence no redness not significantly tender to touch.  No fluctuance.  Neurological:     Mental Status: She is alert and oriented to person, place, and time. Mental status is at baseline.     ED Results / Procedures / Treatments   Labs (all labs ordered are listed, but only abnormal results are displayed) Labs Reviewed - No data to display  EKG None  Radiology No results found.  Procedures Procedures    Medications Ordered in ED Medications - No data to display  ED Course/ Medical Decision Making/ A&P                           Medical Decision Making   Small papule on right cheek.  No evidence of infection such as cellulitis or abscess.  Likely sun related however could potentially be a pimple.  Not consistent with infected insect bite.  Will discharge home at this time with close PCP follow-up.  Return precautions were discussed   Final Clinical Impression(s) / ED Diagnoses Final diagnoses:  Papule    Rx / DC Orders ED Discharge Orders     None         Gailen Shelter, [place] 11/22/21 2028    Ernie Avena, MD 11/22/21 2150

## 2021-11-22 NOTE — ED Triage Notes (Signed)
Pt reports right eye pain and blurriness today. Pt also reports pimple and redness around that eye.

## 2021-11-22 NOTE — Discharge Instructions (Addendum)
Please follow-up with your primary care provider.  I recommend small amount of bacitracin ointment or Vaseline to the area.  Monitor for any worsening and return to the emergency room for any redness or worsening of your symptoms.  Otherwise please follow-up with your primary care provider.  Ultimately may need a biopsy done if it does not resolve with time although I suspect that it well

## 2021-11-26 ENCOUNTER — Other Ambulatory Visit: Payer: Self-pay

## 2021-11-26 ENCOUNTER — Encounter (HOSPITAL_COMMUNITY): Payer: Self-pay

## 2021-11-26 ENCOUNTER — Emergency Department (HOSPITAL_COMMUNITY)
Admission: EM | Admit: 2021-11-26 | Discharge: 2021-11-26 | Disposition: A | Discharge status: Home / Self Care | Payer: Medicare Other | Attending: Emergency Medicine | Admitting: Emergency Medicine

## 2021-11-26 DIAGNOSIS — I10 Essential (primary) hypertension: Secondary | ICD-10-CM | POA: Insufficient documentation

## 2021-11-26 DIAGNOSIS — M79602 Pain in left arm: Secondary | ICD-10-CM | POA: Diagnosis not present

## 2021-11-26 DIAGNOSIS — M79622 Pain in left upper arm: Secondary | ICD-10-CM

## 2021-11-26 DIAGNOSIS — Z79899 Other long term (current) drug therapy: Secondary | ICD-10-CM | POA: Diagnosis not present

## 2021-11-26 MED ORDER — METHOCARBAMOL 500 MG PO TABS: 500.0000 mg | ORAL_TABLET | Freq: Two times a day (BID) | ORAL | 0 refills | Status: DC

## 2021-11-26 NOTE — ED Triage Notes (Addendum)
Pt c/o left shoulder and left upper am x 1 month. Pt seen for same at OSH and PCP. Pt denies numbness/tingling in arm.

## 2021-11-26 NOTE — ED Provider Notes (Signed)
[facility] EMERGENCY DEPARTMENT Provider Note   CSN: [id number] Arrival date & time: 11/26/21  1504     History  Chief Complaint  Patient presents with   Arm Pain    Ashley Hodge is a 61 y.o. female.  Patient with history of hypertension, high cholesterol --presents to the emergency department today for evaluation of left upper arm pain.  Pain has been ongoing for greater than 6 weeks.  She had an injury and was seen in the emergency department on 1 June.  She also had a visit the day prior and radicular symptoms were considered.  She reports a tightness in the left upper arm.  It hurts more when she raises her arm above shoulder height.  She also has difficulty lying on her left side at night due to increasing pain in the arm with pressure.  No swelling.  No distal weakness, numbness, or tingling.  She has tried prednisone, which reportedly made her feel ill.  She did get some benefit from Robaxin but is now out.  She has not been referred to an orthopedist.  No lower extremity symptoms or weakness.  No chest pain or shortness of breath.  No pain in the neck.  No forearm, wrist, or hand symptoms.       Home Medications Prior to Admission medications   Medication Sig Start Date End Date Taking? Authorizing Provider  methocarbamol (ROBAXIN) 500 MG tablet Take 1 tablet (500 mg total) by mouth 2 (two) times daily. 11/26/21  Yes Renne Crigler, PA-C  acetaminophen (TYLENOL) 500 MG tablet Take 1,000 mg by mouth daily as needed (Pain).    [provider]  albuterol (VENTOLIN HFA) 108 (90 Base) MCG/ACT inhaler Inhale 3 puffs into the lungs daily as needed for wheezing or shortness of breath. 04/22/21   [provider]  ezetimibe (ZETIA) 10 MG tablet Take 10 mg by mouth at bedtime. 05/11/21   [provider]  hydrochlorothiazide (HYDRODIURIL) 12.5 MG tablet Take 12.5 mg by mouth daily. 09/07/17   [provider]  losartan (COZAAR) 100 MG  tablet Take 100 mg by mouth at bedtime.    [provider]  ondansetron (ZOFRAN) 4 MG tablet Take 1 tablet (4 mg total) by mouth every 8 (eight) hours as needed for nausea or vomiting. 09/09/21   Pricilla Loveless, MD  pantoprazole (PROTONIX) 20 MG tablet Take 20 mg by mouth 2 (two) times daily. 05/11/21   [provider]  rosuvastatin (CRESTOR) 20 MG tablet Take 20 mg by mouth at bedtime. 05/11/21   [provider]  fluticasone (FLONASE) 50 MCG/ACT nasal spray Place 2 sprays into both nostrils daily. Patient not taking: Reported on 12/10/2019 11/04/19 01/31/20  Myra Rude, MD  ipratropium-albuterol (DUONEB) 0.5-2.5 (3) MG/3ML SOLN Take 3 mLs by nebulization every 6 (six) hours as needed (Wheezing and SOB). Patient not taking: Reported on 12/25/2019 12/11/19 01/31/20  Marguerita Merles Latif, DO      Allergies    Lisinopril    Review of Systems   Review of Systems  Physical Exam Updated Vital Signs BP 126/73 (BP Location: Left Arm)   Pulse 87   Temp 98.4 F (36.9 C) (Oral)   Resp 16   Ht 5' (1.524 m)   Wt 72.6 kg   SpO2 96%   BMI 31.25 kg/m  Physical Exam Vitals and nursing note reviewed.  Constitutional:      Appearance: She is well-developed.  HENT:     Head:  Normocephalic and atraumatic.  Eyes:     Pupils: Pupils are equal, round, and reactive to light.  Cardiovascular:     Pulses: Normal pulses. No decreased pulses.  Musculoskeletal:        General: Tenderness present.     Cervical back: Normal range of motion and neck supple.     Comments: Patient has tenderness to palpation over the triceps on the left side.  No edema noted of the upper arm or forearm.  Sensation of the hand and wrist is normal.  2+ radial pulse.  Patient is able to flex and extend at the elbow without difficulty.  Patient has increased pain with abduction of the shoulder beyond 90 degrees.  Skin:    General: Skin is warm and dry.  Neurological:     General: No focal deficit  present.     Mental Status: She is alert.     Cranial Nerves: No cranial nerve deficit.     Sensory: No sensory deficit.     Motor: No weakness.     Comments: Motor, sensation, and vascular distal to the injury is fully intact.   Psychiatric:        Mood and Affect: Mood normal.     ED Results / Procedures / Treatments   Labs (all labs ordered are listed, but only abnormal results are displayed) Labs Reviewed - No data to display  EKG None  Radiology No results found.  Procedures Procedures    Medications Ordered in ED Medications - No data to display  ED Course/ Medical Decision Making/ A&P    Patient seen and examined. History obtained directly from patient.  I reviewed previous ED notes and visits, previous x-ray of the left upper extremity which includes the cervical spine, left shoulder, elbow, forearm and wrist.  Labs/EKG: None ordered  Imaging: None ordered  Medications/Fluids: None ordered  Most recent vital signs reviewed and are as follows: BP 126/73 (BP Location: Left Arm)   Pulse 87   Temp 98.4 F (36.9 C) (Oral)   Resp 16   Ht 5' (1.524 m)   Wt 72.6 kg   SpO2 96%   BMI 31.25 kg/m   Initial impression: Left upper extremity pain  Home treatment plan: Continued RICE, NSAIDs. Rx Robaxin  Patient counseled on proper use of muscle relaxant medication.  They were told not to drink alcohol, drive any vehicle, or do any dangerous activities while taking this medication.  Patient verbalized understanding.  Return instructions discussed with patient: Return with worsening pain, weakness, swelling.  Follow-up instructions discussed with patient: She would likely benefit from orthopedic evaluation.  Referral given.  Patient will also follow-up with her primary care physician.  States that she plans on calling on Monday for an appointment.                          Medical Decision Making Risk Prescription drug management.   Patient with chronic left  upper extremity pain, worse with movement and pressure.  No signs of swelling or arterial compromise.  Low concern for DVT.  Pain is worse with movement.  Likely musculoskeletal in nature.  Cervical radiculopathy also possible etiology.  No signs of cellulitis or abscess.  Symptoms unilateral, low concern for rhabdo.        Final Clinical Impression(s) / ED Diagnoses Final diagnoses:  Pain of left upper arm    Rx / DC Orders ED Discharge Orders  Ordered    methocarbamol (ROBAXIN) 500 MG tablet  2 times daily        11/26/21 2021              Renne Crigler, PA-C 11/26/21 2028    Cathren Laine, MD 11/26/21 2351

## 2021-11-26 NOTE — ED Provider Triage Note (Signed)
Emergency Medicine Provider Triage Evaluation Note  Ashley Hodge , a 61 y.o. female  was evaluated in triage.  Pt complains of pain in the left arm and shoulder.   She denies any changes.   This has been going on for a month.     Physical Exam  BP 126/73 (BP Location: Left Arm)   Pulse 87   Temp 98.4 F (36.9 C) (Oral)   Resp 16   Ht 5' (1.524 m)   Wt 72.6 kg   SpO2 96%   BMI 31.25 kg/m  Gen:   Awake, no distress   Resp:  Normal effort  MSK:   Moves extremities without difficulty  Other:  Moves arm with out difficulty, 2+ left grip strength.   Medical Decision Making  Medically screening exam initiated at 4:51 PM.  Appropriate orders placed.  Ashley Hodge was informed that the remainder of the evaluation will be completed by another provider, this initial triage assessment does not replace that evaluation, and the importance of remaining in the ED until their evaluation is complete.     Cristina Gong, [place] 11/26/21 1655

## 2021-11-26 NOTE — Discharge Instructions (Signed)
Please read and follow all provided instructions.  Your diagnoses today include:  1. Pain of left upper arm     Tests performed today include: Vital signs. See below for your results today.   Medications prescribed:  Robaxin (methocarbamol) - muscle relaxer medication  DO NOT drive or perform any activities that require you to be awake and alert because this medicine can make you drowsy.   Take any prescribed medications only as directed.  Home care instructions:  Follow any educational materials contained in this packet Follow R.I.C.E. Protocol: R - rest your injury  I  - use ice on injury without applying directly to skin C - compress injury with bandage or splint E - elevate the injury as much as possible  Follow-up instructions: Please follow-up with your primary care provider and/or provided orthopedic physician (bone specialist) in 1 week.   Return instructions:  Please return if your fingers are numb or tingling, appear gray or blue, or you have severe pain (also elevate the arm and loosen splint or wrap if you were given one) Please return to the Emergency Department if you experience worsening symptoms.  Please return if you have any other emergent concerns.  Additional Information:  Your vital signs today were: BP 126/73 (BP Location: Left Arm)   Pulse 87   Temp 98.4 F (36.9 C) (Oral)   Resp 16   Ht 5' (1.524 m)   Wt 72.6 kg   SpO2 96%   BMI 31.25 kg/m  If your blood pressure (BP) was elevated above 135/85 this visit, please have this repeated by your doctor within one month. --------------

## 2021-12-02 ENCOUNTER — Encounter (HOSPITAL_COMMUNITY): Payer: Self-pay | Admitting: Emergency Medicine

## 2021-12-02 ENCOUNTER — Emergency Department (HOSPITAL_COMMUNITY)
Admission: EM | Admit: 2021-12-02 | Discharge: 2021-12-02 | Disposition: A | Discharge status: Home / Self Care | Payer: Medicare Other | Attending: Student | Admitting: Student

## 2021-12-02 ENCOUNTER — Emergency Department (HOSPITAL_COMMUNITY): Payer: Medicare Other

## 2021-12-02 DIAGNOSIS — Z7951 Long term (current) use of inhaled steroids: Secondary | ICD-10-CM | POA: Insufficient documentation

## 2021-12-02 DIAGNOSIS — M25562 Pain in left knee: Secondary | ICD-10-CM | POA: Diagnosis present

## 2021-12-02 DIAGNOSIS — M25512 Pain in left shoulder: Secondary | ICD-10-CM

## 2021-12-02 DIAGNOSIS — J449 Chronic obstructive pulmonary disease, unspecified: Secondary | ICD-10-CM | POA: Insufficient documentation

## 2021-12-02 DIAGNOSIS — R21 Rash and other nonspecific skin eruption: Secondary | ICD-10-CM | POA: Insufficient documentation

## 2021-12-02 DIAGNOSIS — M79601 Pain in right arm: Secondary | ICD-10-CM | POA: Insufficient documentation

## 2021-12-02 DIAGNOSIS — M79602 Pain in left arm: Secondary | ICD-10-CM | POA: Insufficient documentation

## 2021-12-02 DIAGNOSIS — J45909 Unspecified asthma, uncomplicated: Secondary | ICD-10-CM | POA: Insufficient documentation

## 2021-12-02 DIAGNOSIS — I10 Essential (primary) hypertension: Secondary | ICD-10-CM | POA: Diagnosis not present

## 2021-12-02 DIAGNOSIS — Z79899 Other long term (current) drug therapy: Secondary | ICD-10-CM | POA: Insufficient documentation

## 2021-12-02 NOTE — Discharge Instructions (Signed)
You were seen today for pain in the left knee and for bilateral armpit rashes.  The rashes could be from your new deodorant, or could be from skin irritation due to clothing friction.  I would switch back to the previous deodorant and pay attention to help tight clothing is fitting under the armpit.  No fracture or dislocation was noted on the x-ray of your left knee.  Mild arthritic changes were noted.  I recommend using Advil as needed and following up with orthopedics.  Orthopedic follow-up information provided.

## 2021-12-02 NOTE — ED Triage Notes (Signed)
Patient here from home reporting rash to bilateral arm pit and left knee pain x3 days.

## 2021-12-02 NOTE — ED Provider Notes (Signed)
[facility]-EMERGENCY DEPT Provider Note   CSN: [id number] Arrival date & time: 12/02/21  3382     History  Chief Complaint  Patient presents with   Rash   Knee Pain    Ashley Hodge is a 61 y.o. female.  Patient presents to the hospital complaining of bilateral armpit pain and left knee pain which has been ongoing for 3 days.  The patient states that she has had pain on both sides at the lower part of her armpits for the past 3 days.  She also complains of a rash in the area.  She does endorse using a new deodorant starting approximately 3 days ago and states that she may have been wearing clothing which is restricted at the area.  The patient also complains of left-sided knee pain with no known injury or trauma.  She states she feels like there is a knot over an area which looks to be over the patellar tendon.  She is complaining of no gait instability, no weakness, no sensation loss.  The patient has a history of hypertension, high cholesterol, anxiety, asthma, and COPD.  She was seen last week for left upper arm pain and was referred to Dr. Duwayne Heck for further evaluation.  She has not made an appointment at this time  HPI     Home Medications Prior to Admission medications   Medication Sig Start Date End Date Taking? Authorizing Provider  acetaminophen (TYLENOL) 500 MG tablet Take 1,000 mg by mouth daily as needed (Pain).    [provider]  albuterol (VENTOLIN HFA) 108 (90 Base) MCG/ACT inhaler Inhale 3 puffs into the lungs daily as needed for wheezing or shortness of breath. 04/22/21   [provider]  ezetimibe (ZETIA) 10 MG tablet Take 10 mg by mouth at bedtime. 05/11/21   [provider]  hydrochlorothiazide (HYDRODIURIL) 12.5 MG tablet Take 12.5 mg by mouth daily. 09/07/17   [provider]  losartan (COZAAR) 100 MG tablet Take 100 mg by mouth at bedtime.    [provider]  methocarbamol (ROBAXIN) 500 MG  tablet Take 1 tablet (500 mg total) by mouth 2 (two) times daily. 11/26/21   Renne Crigler, PA-C  ondansetron (ZOFRAN) 4 MG tablet Take 1 tablet (4 mg total) by mouth every 8 (eight) hours as needed for nausea or vomiting. 09/09/21   Pricilla Loveless, MD  pantoprazole (PROTONIX) 20 MG tablet Take 20 mg by mouth 2 (two) times daily. 05/11/21   [provider]  rosuvastatin (CRESTOR) 20 MG tablet Take 20 mg by mouth at bedtime. 05/11/21   [provider]  fluticasone (FLONASE) 50 MCG/ACT nasal spray Place 2 sprays into both nostrils daily. Patient not taking: Reported on 12/10/2019 11/04/19 01/31/20  Myra Rude, MD  ipratropium-albuterol (DUONEB) 0.5-2.5 (3) MG/3ML SOLN Take 3 mLs by nebulization every 6 (six) hours as needed (Wheezing and SOB). Patient not taking: Reported on 12/25/2019 12/11/19 01/31/20  Marguerita Merles Latif, DO      Allergies    Lisinopril    Review of Systems   Review of Systems  Musculoskeletal:  Positive for arthralgias. Negative for gait problem and joint swelling.  Skin:  Positive for rash.    Physical Exam Updated Vital Signs BP (!) 143/79 (BP Location: Left Arm)   Pulse (!) 103   Temp 98.3 F (36.8 C) (Oral)   Resp 18   SpO2 97%  Physical Exam Vitals and nursing note reviewed.  Constitutional:  General: She is not in acute distress. HENT:     Head: Normocephalic and atraumatic.  Eyes:     Conjunctiva/sclera: Conjunctivae normal.  Cardiovascular:     Rate and Rhythm: Normal rate and regular rhythm.     Pulses: Normal pulses.     Heart sounds: Normal heart sounds.  Pulmonary:     Effort: Pulmonary effort is normal.     Breath sounds: Normal breath sounds.  Musculoskeletal:        General: Tenderness present. Normal range of motion.     Cervical back: Normal range of motion.     Comments: Tenderness over the patellar tendon near the tibial tuberosity on the left side.  Small knot felt in the area  Skin:    General: Skin is warm  and dry.     Comments: Red area noted bilaterally along the axillary line around the area where the patient's bra would connect with skin  Neurological:     Mental Status: She is alert.    ED Results / Procedures / Treatments   Labs (all labs ordered are listed, but only abnormal results are displayed) Labs Reviewed - No data to display  EKG None  Radiology No results found.  Procedures Procedures    Medications Ordered in ED Medications - No data to display  ED Course/ Medical Decision Making/ A&P                           Medical Decision Making Amount and/or Complexity of Data Reviewed Radiology: ordered.   The area under the patient's armpits appears to be localized irritation.  No fluctuance to suggest an abscess, no signs of cellulitis at this time.  Based on the area of the patient's redness it appears to possibly be related to clothing irritation.  She has started using a new scented deodorant at approximately the same time of symptom onset.  I recommend she stop using the deodorant and pay attention to clothing irritation for the time being.  The patient also complains of left-sided knee tenderness.  Differential includes but is not limited to fracture, dislocation, soft tissue injury, ligamentous or tendon injury, arthritis, and others  I ordered and personally interpreted imaging of the left knee.  Mild osteoarthritic changes were noted.  I agree with radiologist findings.  The patient's knee pain has unclear etiology at this time but does not appear to be due to an acute injury.  I will have the patient follow-up with orthopedics for further evaluation and management.  She may use Advil as needed and may ice her knee if she finds it beneficial.  Discharge home          Final Clinical Impression(s) / ED Diagnoses Final diagnoses:  None    Rx / DC Orders ED Discharge Orders     None         Pamala Duffel 12/03/21 1552    Kommor,  Wyn Forster, MD 12/15/21 [phone]

## 2021-12-09 ENCOUNTER — Encounter (HOSPITAL_COMMUNITY): Payer: Self-pay

## 2021-12-09 ENCOUNTER — Other Ambulatory Visit: Payer: Self-pay

## 2021-12-09 ENCOUNTER — Emergency Department (HOSPITAL_COMMUNITY)
Admission: EM | Admit: 2021-12-09 | Discharge: 2021-12-10 | Disposition: A | Discharge status: Home / Self Care | Payer: Medicare Other | Attending: Emergency Medicine | Admitting: Emergency Medicine

## 2021-12-09 DIAGNOSIS — Z7951 Long term (current) use of inhaled steroids: Secondary | ICD-10-CM | POA: Insufficient documentation

## 2021-12-09 DIAGNOSIS — I10 Essential (primary) hypertension: Secondary | ICD-10-CM | POA: Diagnosis not present

## 2021-12-09 DIAGNOSIS — J449 Chronic obstructive pulmonary disease, unspecified: Secondary | ICD-10-CM | POA: Insufficient documentation

## 2021-12-09 DIAGNOSIS — Z79899 Other long term (current) drug therapy: Secondary | ICD-10-CM | POA: Insufficient documentation

## 2021-12-09 DIAGNOSIS — M25512 Pain in left shoulder: Secondary | ICD-10-CM | POA: Diagnosis present

## 2021-12-09 NOTE — ED Triage Notes (Signed)
Patient has chronic right sided shoulder pain that has now moved into her neck. Ongoing for a month. No trauma, no falls.

## 2021-12-10 DIAGNOSIS — M25512 Pain in left shoulder: Secondary | ICD-10-CM | POA: Diagnosis not present

## 2021-12-10 MED ORDER — LIDOCAINE 5 % EX PTCH
2.0000 | MEDICATED_PATCH | CUTANEOUS | Status: DC
  Administered 2021-12-10: 2 via TRANSDERMAL
  Filled 2021-12-10 (×2): qty 2

## 2021-12-10 MED ORDER — HYDROCODONE-ACETAMINOPHEN 5-325 MG PO TABS
1.0000 | ORAL_TABLET | Freq: Once | ORAL | Status: AC
  Administered 2021-12-10: 1 via ORAL
  Filled 2021-12-10: qty 1

## 2021-12-10 MED ORDER — LIDOCAINE 5 % EX PTCH: 1.0000 | MEDICATED_PATCH | CUTANEOUS | 0 refills | Status: DC

## 2021-12-10 MED ORDER — KETOROLAC TROMETHAMINE 15 MG/ML IJ SOLN
15.0000 mg | Freq: Once | INTRAMUSCULAR | Status: AC
  Administered 2021-12-10: 15 mg via INTRAMUSCULAR
  Filled 2021-12-10: qty 1

## 2021-12-10 NOTE — ED Provider Notes (Signed)
[facility]-EMERGENCY DEPT Provider Note   CSN: [id number] Arrival date & time: 12/09/21  2351     History  Chief Complaint  Patient presents with   Shoulder Pain    Ashley Hodge is a 61 y.o. female who presents with chronic left shoulder pain for which she has been prescribed muscle relaxers in the past.  Has orthopedic appointment on August 8.  States the pain is keeping her from sleeping at this time.  Radiating into her neck at this point.  No numbness, tingling, weakness in her hands.  Just tightness sensation and aching pain.  Not exacerbated with movement.  I personally reviewed her medical records.  She has history of COPD, hypertension, hyperlipidemia.  She is not anticoagulated.  HPI     Home Medications Prior to Admission medications   Medication Sig Start Date End Date Taking? Authorizing Provider  lidocaine (LIDODERM) 5 % Place 1 patch onto the skin daily. Remove & Discard patch within 12 hours or as directed by MD 12/10/21  Yes Ashley Hodge, Ashley Gavia, PA-C  acetaminophen (TYLENOL) 500 MG tablet Take 1,000 mg by mouth daily as needed (Pain).    [provider]  albuterol (VENTOLIN HFA) 108 (90 Base) MCG/ACT inhaler Inhale 3 puffs into the lungs daily as needed for wheezing or shortness of breath. 04/22/21   [provider]  ezetimibe (ZETIA) 10 MG tablet Take 10 mg by mouth at bedtime. 05/11/21   [provider]  hydrochlorothiazide (HYDRODIURIL) 12.5 MG tablet Take 12.5 mg by mouth daily. 09/07/17   [provider]  losartan (COZAAR) 100 MG tablet Take 100 mg by mouth at bedtime.    [provider]  methocarbamol (ROBAXIN) 500 MG tablet Take 1 tablet (500 mg total) by mouth 2 (two) times daily. 11/26/21   Ashley Crigler, PA-C  ondansetron (ZOFRAN) 4 MG tablet Take 1 tablet (4 mg total) by mouth every 8 (eight) hours as needed for nausea or vomiting. 09/09/21   Ashley Loveless, MD  pantoprazole (PROTONIX) 20  MG tablet Take 20 mg by mouth 2 (two) times daily. 05/11/21   [provider]  rosuvastatin (CRESTOR) 20 MG tablet Take 20 mg by mouth at bedtime. 05/11/21   [provider]  fluticasone (FLONASE) 50 MCG/ACT nasal spray Place 2 sprays into both nostrils daily. Patient not taking: Reported on 12/10/2019 11/04/19 01/31/20  Ashley Rude, MD  ipratropium-albuterol (DUONEB) 0.5-2.5 (3) MG/3ML SOLN Take 3 mLs by nebulization every 6 (six) hours as needed (Wheezing and SOB). Patient not taking: Reported on 12/25/2019 12/11/19 01/31/20  Ashley Merles Latif, DO      Allergies    Lisinopril    Review of Systems   Review of Systems  Musculoskeletal:  Negative for neck stiffness.       Left shoulder pain  Neurological:  Negative for weakness and numbness.  All other systems reviewed and are negative.   Physical Exam Updated Vital Signs BP (!) 158/83 (BP Location: Left Arm)   Pulse 93   Temp 98.2 F (36.8 C) (Oral)   Resp 18   Ht 5' (1.524 m)   Wt 72.6 kg   SpO2 95%   BMI 31.25 kg/m  Physical Exam Vitals and nursing note reviewed.  Constitutional:      Appearance: She is not toxic-appearing.  HENT:     Head: Normocephalic and atraumatic.  Eyes:     General: No scleral icterus.       Right eye: No discharge.  Left eye: No discharge.     Conjunctiva/sclera: Conjunctivae normal.  Neck:     Meningeal: Brudzinski's sign and Kernig's sign absent.  Cardiovascular:     Pulses: Normal pulses.  Pulmonary:     Effort: Pulmonary effort is normal.  Abdominal:     Palpations: Abdomen is soft.  Musculoskeletal:     Comments: Full range of motion of the shoulder on the left without pain.  Normal radial pulses bilaterally and symmetric grip strength 5 out of 5.  Spasm of the bilateral trapezius and cervical paraspinous musculature, left greater than right without trigger point.  No meningismus  Skin:    General: Skin is warm and dry.  Neurological:     General: No  focal deficit present.     Mental Status: She is alert.  Psychiatric:        Mood and Affect: Mood normal.     ED Results / Procedures / Treatments   Labs (all labs ordered are listed, but only abnormal results are displayed) Labs Reviewed - No data to display  EKG None  Radiology No results found.  Procedures Procedures    Medications Ordered in ED Medications  lidocaine (LIDODERM) 5 % 2 patch (has no administration in time range)  HYDROcodone-acetaminophen (NORCO/VICODIN) 5-325 MG per tablet 1 tablet (has no administration in time range)  ketorolac (TORADOL) 15 MG/ML injection 15 mg (15 mg Intramuscular Given 12/10/21 0150)    ED Course/ Medical Decision Making/ A&P                           Medical Decision Making 61 year old female presents with acute on chronic left shoulder pain.   Normal x-rays in the past.  Hypertensive on intake and vital signs otherwise normal.  Neurovascular intact in hands bilaterally, spasm and tenderness palpation of the trapezius and paraspinous musculature of the cervical spine bilaterally left greater than right.  Symmetric strength and sensation.  Risk Prescription drug management.   Clinical picture most consistent with acute muscular spasm.  Will offer Lidoderm patch and Toradol injection in the emergency department.  Patient with muscle relaxer at home and close outpatient orthopedic follow-up scheduled for August 8.  No further work-up warranted near this time.  No indication for imaging at this point, pain does not appear radicular in nature nor does she have any paresthesias or weakness in the hands therefore will not prescribe steroid taper.  No further work-up warranted near this time.  Clinical concern for emergent underlying etiology would warrant further ED work-up or inpatient management is exceedingly low. [name]  voiced understanding of her medical evaluation and treatment plan. Each of their questions answered to their  expressed satisfaction.  Return precautions were given.  Patient is well-appearing, stable, and was discharged in good condition..  This chart was dictated using voice recognition software, Dragon. Despite the best efforts of this provider to proofread and correct errors, errors may still occur which can change documentation meaning.  Final Clinical Impression(s) / ED Diagnoses Final diagnoses:  Acute pain of left shoulder    Rx / DC Orders ED Discharge Orders          Ordered    lidocaine (LIDODERM) 5 %  Every 24 hours        12/10/21 0132              Davi Rotan, Ashley Gavia, PA-C 12/10/21 0200    Nira Conn, MD 12/10/21 [phone]

## 2021-12-10 NOTE — Discharge Instructions (Addendum)
You were seen in the emergency department today for your shoulder pain.  Your physical exam and vital signs are very reassuring.  The muscles in your left shoulder are in what is called spasm, meaning they are inappropriately tightened up.  This can be quite painful.  To help with your pain you may take Tylenol and / or NSAID medication (such as ibuprofen or naproxen) to help with your pain.    You may also utilize topical pain relief such as Biofreeze, IcyHot, or topical lidocaine patches.  I also recommend that you apply heat to the area, such as a hot shower or heating pad, and follow heat application with massage of the muscles that are most tight.  Please return to the emergency department if you develop any numbness/tingling/weakness in your arms or legs, any difficulty urinating, or urinary incontinence chest pain, shortness of breath, abdominal pain, nausea or vomiting that does not stop, or any other new severe symptoms.

## 2021-12-14 ENCOUNTER — Emergency Department (HOSPITAL_COMMUNITY)
Admission: EM | Admit: 2021-12-14 | Discharge: 2021-12-14 | Disposition: A | Discharge status: Home / Self Care | Payer: Medicare Other | Attending: Emergency Medicine | Admitting: Emergency Medicine

## 2021-12-14 ENCOUNTER — Encounter (HOSPITAL_COMMUNITY): Payer: Self-pay

## 2021-12-14 ENCOUNTER — Other Ambulatory Visit: Payer: Self-pay

## 2021-12-14 DIAGNOSIS — W57XXXA Bitten or stung by nonvenomous insect and other nonvenomous arthropods, initial encounter: Secondary | ICD-10-CM | POA: Insufficient documentation

## 2021-12-14 DIAGNOSIS — I10 Essential (primary) hypertension: Secondary | ICD-10-CM | POA: Diagnosis not present

## 2021-12-14 DIAGNOSIS — M79672 Pain in left foot: Secondary | ICD-10-CM | POA: Diagnosis not present

## 2021-12-14 DIAGNOSIS — Z79899 Other long term (current) drug therapy: Secondary | ICD-10-CM | POA: Diagnosis not present

## 2021-12-14 DIAGNOSIS — S90862A Insect bite (nonvenomous), left foot, initial encounter: Secondary | ICD-10-CM | POA: Diagnosis not present

## 2021-12-14 MED ORDER — CEPHALEXIN 500 MG PO CAPS
500.0000 mg | ORAL_CAPSULE | Freq: Once | ORAL | Status: AC
  Administered 2021-12-14: 500 mg via ORAL
  Filled 2021-12-14: qty 1

## 2021-12-14 MED ORDER — CEPHALEXIN 500 MG PO CAPS: 500.0000 mg | ORAL_CAPSULE | Freq: Four times a day (QID) | ORAL | 0 refills | Status: DC

## 2021-12-14 NOTE — ED Provider Notes (Signed)
[facility]-EMERGENCY DEPT Provider Note   CSN: [id number] Arrival date & time: 12/14/21  1840     History  Chief Complaint  Patient presents with   Foot Pain    Ashley Hodge is a 61 y.o. female with a history of hypertension and elevated cholesterol who presents to the emergency department with concerns for left foot pain onset prior to arrival.  Notes that she was ambulating outside with shoes on without socks when she noted a stinging sensation to her foot.  Patient notes that she has been able to ambulate.  No meds tried prior to arrival.  Denies fever, drainage.  The history is provided by the patient. No language interpreter was used.       Home Medications Prior to Admission medications   Medication Sig Start Date End Date Taking? Authorizing Provider  cephALEXin (KEFLEX) 500 MG capsule Take 1 capsule (500 mg total) by mouth 4 (four) times daily. 12/14/21  Yes Audie Wieser A, PA-C  acetaminophen (TYLENOL) 500 MG tablet Take 1,000 mg by mouth daily as needed (Pain).    [provider]  albuterol (VENTOLIN HFA) 108 (90 Base) MCG/ACT inhaler Inhale 3 puffs into the lungs daily as needed for wheezing or shortness of breath. 04/22/21   [provider]  ezetimibe (ZETIA) 10 MG tablet Take 10 mg by mouth at bedtime. 05/11/21   [provider]  hydrochlorothiazide (HYDRODIURIL) 12.5 MG tablet Take 12.5 mg by mouth daily. 09/07/17   [provider]  lidocaine (LIDODERM) 5 % Place 1 patch onto the skin daily. Remove & Discard patch within 12 hours or as directed by MD 12/10/21   Sponseller, Eugene Gavia, PA-C  losartan (COZAAR) 100 MG tablet Take 100 mg by mouth at bedtime.    [provider]  methocarbamol (ROBAXIN) 500 MG tablet Take 1 tablet (500 mg total) by mouth 2 (two) times daily. 11/26/21   Renne Crigler, PA-C  ondansetron (ZOFRAN) 4 MG tablet Take 1 tablet (4 mg total) by mouth every 8 (eight) hours as needed for  nausea or vomiting. 09/09/21   Pricilla Loveless, MD  pantoprazole (PROTONIX) 20 MG tablet Take 20 mg by mouth 2 (two) times daily. 05/11/21   [provider]  rosuvastatin (CRESTOR) 20 MG tablet Take 20 mg by mouth at bedtime. 05/11/21   [provider]  fluticasone (FLONASE) 50 MCG/ACT nasal spray Place 2 sprays into both nostrils daily. Patient not taking: Reported on 12/10/2019 11/04/19 01/31/20  Myra Rude, MD  ipratropium-albuterol (DUONEB) 0.5-2.5 (3) MG/3ML SOLN Take 3 mLs by nebulization every 6 (six) hours as needed (Wheezing and SOB). Patient not taking: Reported on 12/25/2019 12/11/19 01/31/20  Marguerita Merles Latif, DO      Allergies    Lisinopril    Review of Systems   Review of Systems  Constitutional:  Negative for fever.  Musculoskeletal:  Positive for arthralgias. Negative for joint swelling.  Skin:  Positive for color change. Negative for wound.  All other systems reviewed and are negative.   Physical Exam Updated Vital Signs BP (!) 140/72 (BP Location: Left Arm)   Pulse 95   Temp 98.8 F (37.1 C) (Oral)   Resp 18   Ht 5' (1.524 m)   Wt 72.6 kg   SpO2 95%   BMI 31.25 kg/m  Physical Exam Vitals and nursing note reviewed.  Constitutional:      General: She is not in acute distress.    Appearance: Normal appearance.  Eyes:     General: No scleral icterus.    Extraocular Movements: Extraocular movements intact.  Cardiovascular:     Rate and Rhythm: Normal rate.  Pulmonary:     Effort: Pulmonary effort is normal. No respiratory distress.  Musculoskeletal:     Cervical back: Neck supple.     Comments: Mild tenderness to palpation to anterior aspect of left foot.  Small area of erythema noted with no obvious drainage, deformity, crepitus.  Full active range of motion of left foot against resistance.  Pedal pulses intact.  No tenderness to palpation noted to distal left leg.  Skin:    General: Skin is warm and dry.     Findings: No bruising,  erythema or rash.  Neurological:     Mental Status: She is alert.  Psychiatric:        Behavior: Behavior normal.     ED Results / Procedures / Treatments   Labs (all labs ordered are listed, but only abnormal results are displayed) Labs Reviewed - No data to display  EKG None  Radiology No results found.  Procedures Procedures    Medications Ordered in ED Medications  cephALEXin (KEFLEX) capsule 500 mg (has no administration in time range)    ED Course/ Medical Decision Making/ A&P                           Medical Decision Making Risk Prescription drug management.   Pt presents with left foot pain and swelling onset today.  Denies recent injury, trauma, fall.  Patient notes that she was walking through grass when she felt a sting to the top of her foot.  No meds tried prior to arrival. Vital signs, afebrile. On exam, pt with Mild tenderness to palpation to anterior aspect of left foot.  Small area of erythema noted with no obvious drainage, deformity, crepitus.  Full active range of motion of left foot against resistance.  Pedal pulses intact.  No tenderness to palpation noted to distal left leg. No acute cardiovascular, respiratory, abdominal exam findings. Differential diagnosis includes cellulitis, abscess, insect bite, contact dermatitis.     Medications:  I ordered medication including Keflex for antibiotic prophylaxis I have reviewed the patients home medicines and have made adjustments as needed    Disposition: Presentation suspicious for insect bite of left foot.  Doubt abscess or contact dermatitis at this time.  Low suspicion for cellulitis at this time. After consideration of the diagnostic results and the patients response to treatment, I feel that the patient would benefit from Discharge home.  However, due to redness and swelling and pain noted to anterior aspect of foot, will treat with course of Keflex.  Supportive care measures and strict return  precautions discussed with patient at bedside. Pt acknowledges and verbalizes understanding. Pt appears safe for discharge. Follow up as indicated in discharge paperwork.    This chart was dictated using voice recognition software, Dragon. Despite the best efforts of this provider to proofread and correct errors, errors may still occur which can change documentation meaning.  Final Clinical Impression(s) / ED Diagnoses Final diagnoses:  Foot pain, left  Insect bite of left foot, initial encounter    Rx / DC Orders ED Discharge Orders          Ordered    cephALEXin (KEFLEX) 500 MG capsule  4 times daily        12/14/21 1959  Nakea Gouger A, PA-C 12/14/21 2324    Mancel Bale, MD 12/16/21 [phone]

## 2021-12-14 NOTE — ED Triage Notes (Signed)
Patient c/o left foot pain and swelling. Patient states she was outside walking with slides on without socks when she felt a stinging sensation on the left foot. Patient noticed a red swollen area on her left foot. Patient states it is tender to touch.

## 2021-12-14 NOTE — Discharge Instructions (Addendum)
It was a pleasure taking care of you today!  You will be sent a prescription for Keflex, and sure to complete the entire dose of the antibiotic as prescribed.  You may place ice to the affected area for up to 15 minutes at a time, ensure to place a barrier between your skin and the ice.  Ensure to elevate your foot and night.  You will be given an Ace wrap today in the emergency department, you may wear it during the day and remove it at night.  You may follow-up with your primary care provider as needed.  Return to the ED if you are experiencing increasing/worsening redness, swelling, inability to walk, drainage, worsening symptoms.

## 2021-12-14 NOTE — ED Notes (Signed)
I provided reinforced discharge education based off of after visit summary/care provided. Pt acknowledged and understood my education. Pt had no further questions/concerns for provider/myself. After visit summary provided to pt. 

## 2021-12-20 ENCOUNTER — Encounter (HOSPITAL_COMMUNITY): Payer: Self-pay | Admitting: Emergency Medicine

## 2021-12-20 ENCOUNTER — Emergency Department (HOSPITAL_COMMUNITY)
Admission: EM | Admit: 2021-12-20 | Discharge: 2021-12-20 | Disposition: A | Discharge status: Home / Self Care | Payer: Medicare Other | Attending: Emergency Medicine | Admitting: Emergency Medicine

## 2021-12-20 DIAGNOSIS — M542 Cervicalgia: Secondary | ICD-10-CM | POA: Diagnosis present

## 2021-12-20 MED ORDER — METHOCARBAMOL 500 MG PO TABS
500.0000 mg | ORAL_TABLET | Freq: Once | ORAL | Status: AC
  Administered 2021-12-20: 500 mg via ORAL
  Filled 2021-12-20: qty 1

## 2021-12-20 MED ORDER — METHOCARBAMOL 500 MG PO TABS: 500.0000 mg | ORAL_TABLET | Freq: Two times a day (BID) | ORAL | 0 refills | Status: DC

## 2021-12-20 NOTE — ED Provider Notes (Signed)
[facility] EMERGENCY DEPARTMENT Provider Note   CSN: [id number] Arrival date & time: 12/20/21  1014     History  Chief Complaint  Patient presents with   Neck Pain    Ashley Hodge is a 61 y.o. female with a history of chronic left shoulder pain who presents to the emergency department for left neck pain. She states that this started about 12 hours ago and reports that it is a sharp pain that radiates down to her left shoulder. She reports that she had this pain in the past and was given robaxin which helped her pain. She has tried ibuprofen for the pain with no relief. She denies any neurological symptoms. She denies any weight loss, fevers, or chills. She denies any bladder or bowel incontinence.    Neck Pain Associated symptoms: no fever        Home Medications Prior to Admission medications   Medication Sig Start Date End Date Taking? Authorizing Provider  methocarbamol (ROBAXIN) 500 MG tablet Take 1 tablet (500 mg total) by mouth 2 (two) times daily. 12/20/21  Yes Melene Plan, DO  acetaminophen (TYLENOL) 500 MG tablet Take 1,000 mg by mouth daily as needed (Pain).    [provider]  albuterol (VENTOLIN HFA) 108 (90 Base) MCG/ACT inhaler Inhale 3 puffs into the lungs daily as needed for wheezing or shortness of breath. 04/22/21   [provider]  cephALEXin (KEFLEX) 500 MG capsule Take 1 capsule (500 mg total) by mouth 4 (four) times daily. 12/14/21   Blue, Soijett A, PA-C  ezetimibe (ZETIA) 10 MG tablet Take 10 mg by mouth at bedtime. 05/11/21   [provider]  hydrochlorothiazide (HYDRODIURIL) 12.5 MG tablet Take 12.5 mg by mouth daily. 09/07/17   [provider]  lidocaine (LIDODERM) 5 % Place 1 patch onto the skin daily. Remove & Discard patch within 12 hours or as directed by MD 12/10/21   Sponseller, Eugene Gavia, PA-C  losartan (COZAAR) 100 MG tablet Take 100 mg by mouth at bedtime.    [provider]  ondansetron  (ZOFRAN) 4 MG tablet Take 1 tablet (4 mg total) by mouth every 8 (eight) hours as needed for nausea or vomiting. 09/09/21   Pricilla Loveless, MD  pantoprazole (PROTONIX) 20 MG tablet Take 20 mg by mouth 2 (two) times daily. 05/11/21   [provider]  rosuvastatin (CRESTOR) 20 MG tablet Take 20 mg by mouth at bedtime. 05/11/21   [provider]  fluticasone (FLONASE) 50 MCG/ACT nasal spray Place 2 sprays into both nostrils daily. Patient not taking: Reported on 12/10/2019 11/04/19 01/31/20  Myra Rude, MD  ipratropium-albuterol (DUONEB) 0.5-2.5 (3) MG/3ML SOLN Take 3 mLs by nebulization every 6 (six) hours as needed (Wheezing and SOB). Patient not taking: Reported on 12/25/2019 12/11/19 01/31/20  Marguerita Merles Latif, DO      Allergies    Lisinopril    Review of Systems   Review of Systems  Constitutional:  Negative for chills, fever and unexpected weight change.  Musculoskeletal:  Positive for neck pain.    Physical Exam Updated Vital Signs BP (!) 140/86 (BP Location: Left Arm)   Pulse 99   Temp 98.9 F (37.2 C) (Oral)   Resp 16   SpO2 97%  Physical Exam  General: Alert and orientated x3. Patient is resting comfortably in bed in no acute distress  Head: Normocephalic, atraumatic  Neck: Supple, nontender, full range of motion  on active and passive flexion, extension,  sidebending with pain.  Extremities: FROM of left and right shoulder on all movements.  Neuro: Patient with full sensation intact to bilateral upper and lower extremities.    ED Results / Procedures / Treatments   Labs (all labs ordered are listed, but only abnormal results are displayed) Labs Reviewed - No data to display  EKG None  Radiology No results found.  Procedures Procedures    Medications Ordered in ED Medications  methocarbamol (ROBAXIN) tablet 500 mg (500 mg Oral Given 12/20/21 1225)    ED Course/ Medical Decision Making/ A&P                           Medical Decision  Making This is a 61 year old female with a past medical history of chronic left shoulder pain presenting with concerns of neck pain. She states that this started about 12 hours ago and felt that she slept on it wrong. Patient denies any trauma to the area. She reports that she had robaxin in the past that helped her. Patient was seen in the Emergency department a few days ago for left shoulder pain and was given a Toradol shot. On exam patient does have full range of motion in the neck and left shoulder with pain. There is no tenderness noted. Patient has an appointment for her shoulder pain on tomorrow 12/21/2021. Given my exam, and the history, I am inclined to think this is musculoskeletal  in nature. I do not think this is a carotid artery dissection or any other concerning etiology at this point. No indication to image at this point. Will give robaxin to hold her over until she can get to her ortho appointment.  Amount and/or Complexity of Data Reviewed External Data Reviewed: notes.  Risk Prescription drug management.          Final Clinical Impression(s) / ED Diagnoses Final diagnoses:  Neck pain    Rx / DC Orders ED Discharge Orders          Ordered    methocarbamol (ROBAXIN) 500 MG tablet  2 times daily        12/20/21 1156              Modena Slater, DO 12/20/21 1310    Melene Plan, DO 12/20/21 1319

## 2021-12-20 NOTE — Discharge Instructions (Signed)
Ashley Hodge, Schier you for allowing me to take part in your care today.  Here is what we discussed today.   1. Given your neck pain, this is likely a muscle strain or spasm. I will give you some robaxin muscle relaxer for this pain. This will help you until you get to your orthopedic appointment on  12/21/2021.   2. If you develop any neurological symptoms, fever, or pain out of proportion, please return back to the ED.    Thank you, Dr. Allena Katz

## 2021-12-20 NOTE — ED Triage Notes (Addendum)
Patient here with complaint of pain in left neck radiating into left arm that started over a month ago. Patient states she was seen for same at [facility] several days ago and was given an injection that temporarily relieved pain. Patient denies chest pain, denies shortness of breath, denies dizziness, patient is alert, oriented, ambulatory, and in no apparent distress at this time.  Patient has an appointment with an orthopedist tomorrow.

## 2021-12-24 ENCOUNTER — Other Ambulatory Visit: Payer: Self-pay | Admitting: Family Medicine

## 2021-12-24 ENCOUNTER — Ambulatory Visit
Admission: RE | Admit: 2021-12-24 | Discharge: 2021-12-24 | Disposition: A | Discharge status: Home / Self Care | Payer: Medicare Other | Source: Ambulatory Visit | Attending: Family Medicine | Admitting: Family Medicine

## 2021-12-24 DIAGNOSIS — R52 Pain, unspecified: Secondary | ICD-10-CM

## 2021-12-24 DIAGNOSIS — R609 Edema, unspecified: Secondary | ICD-10-CM

## 2021-12-28 ENCOUNTER — Emergency Department (HOSPITAL_COMMUNITY)
Admission: EM | Admit: 2021-12-28 | Discharge: 2021-12-29 | Disposition: A | Discharge status: Home / Self Care | Payer: Medicare Other | Attending: Emergency Medicine | Admitting: Emergency Medicine

## 2021-12-28 ENCOUNTER — Encounter (HOSPITAL_COMMUNITY): Payer: Self-pay | Admitting: Emergency Medicine

## 2021-12-28 DIAGNOSIS — I10 Essential (primary) hypertension: Secondary | ICD-10-CM | POA: Diagnosis not present

## 2021-12-28 DIAGNOSIS — R109 Unspecified abdominal pain: Secondary | ICD-10-CM | POA: Diagnosis not present

## 2021-12-28 DIAGNOSIS — J449 Chronic obstructive pulmonary disease, unspecified: Secondary | ICD-10-CM | POA: Diagnosis not present

## 2021-12-28 DIAGNOSIS — J45909 Unspecified asthma, uncomplicated: Secondary | ICD-10-CM | POA: Insufficient documentation

## 2021-12-28 DIAGNOSIS — Z79899 Other long term (current) drug therapy: Secondary | ICD-10-CM | POA: Insufficient documentation

## 2021-12-28 DIAGNOSIS — Z7951 Long term (current) use of inhaled steroids: Secondary | ICD-10-CM | POA: Insufficient documentation

## 2021-12-28 DIAGNOSIS — R0789 Other chest pain: Secondary | ICD-10-CM | POA: Insufficient documentation

## 2021-12-28 NOTE — ED Notes (Signed)
Pt provided with specimen cup and asked to provide sample. Pt states she will attempt to provide one now.

## 2021-12-28 NOTE — ED Notes (Signed)
Pt reporting chest pain.

## 2021-12-28 NOTE — ED Triage Notes (Signed)
Pt reports L flank pain that she states started earlier today. Denies injury. Denies dysuria, nausea, or vomiting. Also requesting to have a mole on her R anterior shoulder checked. States that it is tender to touch.

## 2021-12-29 ENCOUNTER — Emergency Department (HOSPITAL_COMMUNITY): Payer: Medicare Other

## 2021-12-29 DIAGNOSIS — R0789 Other chest pain: Secondary | ICD-10-CM | POA: Diagnosis not present

## 2021-12-29 LAB — CBC WITH DIFFERENTIAL/PLATELET
Abs Immature Granulocytes: 0.03 10*3/uL (ref 0.00–0.07)
Basophils Absolute: 0.1 10*3/uL (ref 0.0–0.1)
Basophils Relative: 1 %
Eosinophils Absolute: 0.5 10*3/uL (ref 0.0–0.5)
Eosinophils Relative: 4 %
HCT: 41.8 % (ref 36.0–46.0)
Hemoglobin: 13.7 g/dL (ref 12.0–15.0)
Immature Granulocytes: 0 %
Lymphocytes Relative: 30 %
Lymphs Abs: 3.8 10*3/uL (ref 0.7–4.0)
MCH: 31.6 pg (ref 26.0–34.0)
MCHC: 32.8 g/dL (ref 30.0–36.0)
MCV: 96.3 fL (ref 80.0–100.0)
Monocytes Absolute: 1.3 10*3/uL — ABNORMAL HIGH (ref 0.1–1.0)
Monocytes Relative: 10 %
Neutro Abs: 6.9 10*3/uL (ref 1.7–7.7)
Neutrophils Relative %: 55 %
Platelets: 271 10*3/uL (ref 150–400)
RBC: 4.34 MIL/uL (ref 3.87–5.11)
RDW: 12 % (ref 11.5–15.5)
WBC: 12.6 10*3/uL — ABNORMAL HIGH (ref 4.0–10.5)
nRBC: 0 % (ref 0.0–0.2)

## 2021-12-29 LAB — URINALYSIS, ROUTINE W REFLEX MICROSCOPIC
Bilirubin Urine: NEGATIVE
Glucose, UA: NEGATIVE mg/dL
Hgb urine dipstick: NEGATIVE
Ketones, ur: NEGATIVE mg/dL
Nitrite: NEGATIVE
Protein, ur: NEGATIVE mg/dL
Specific Gravity, Urine: 1.019 (ref 1.005–1.030)
pH: 5 (ref 5.0–8.0)

## 2021-12-29 LAB — BASIC METABOLIC PANEL
Anion gap: 7 (ref 5–15)
BUN: 16 mg/dL (ref 8–23)
CO2: 27 mmol/L (ref 22–32)
Calcium: 8.8 mg/dL — ABNORMAL LOW (ref 8.9–10.3)
Chloride: 106 mmol/L (ref 98–111)
Creatinine, Ser: 0.94 mg/dL (ref 0.44–1.00)
GFR, Estimated: >60 mL/min (ref >60)
Glucose, Bld: 114 mg/dL — ABNORMAL HIGH (ref 70–99)
Potassium: 3.7 mmol/L (ref 3.5–5.1)
Sodium: 140 mmol/L (ref 135–145)

## 2021-12-29 LAB — TROPONIN I (HIGH SENSITIVITY): Troponin I (High Sensitivity): 2 ng/L (ref <18)

## 2021-12-29 NOTE — Discharge Instructions (Signed)
You were seen today for nonspecific chest wall and flank pain.  Your work-up today is reassuring.  Take Tylenol or ibuprofen as needed for pain.

## 2021-12-29 NOTE — ED Provider Notes (Signed)
[facility]-EMERGENCY DEPT Provider Note   CSN: [id number] Arrival date & time: 12/28/21  2004     History  Chief Complaint  Patient presents with   Flank Pain    Ashley Hodge is a 61 y.o. female.  HPI     This is a 61 year old female who presents with left chest and flank pain.  Patient reports onset of symptoms yesterday.  She reports pain "shooting down my left side."  She reports that it is mostly in her chest.  Nothing seems to make it better or worse including deep breathing, movement, or food.  Denies fevers or cough.  She has not taken anything for the pain.  Denies injury.  Denies any leg swelling or history of blood clots.  No noted rash.  Home Medications Prior to Admission medications   Medication Sig Start Date End Date Taking? Authorizing Provider  acetaminophen (TYLENOL) 500 MG tablet Take 1,000 mg by mouth daily as needed (Pain).    [provider]  albuterol (VENTOLIN HFA) 108 (90 Base) MCG/ACT inhaler Inhale 3 puffs into the lungs daily as needed for wheezing or shortness of breath. 04/22/21   [provider]  cephALEXin (KEFLEX) 500 MG capsule Take 1 capsule (500 mg total) by mouth 4 (four) times daily. 12/14/21   Blue, Soijett A, PA-C  clindamycin (CLEOCIN) 300 MG capsule Take 300 mg by mouth every 8 (eight) hours. 12/23/21   [provider]  ezetimibe (ZETIA) 10 MG tablet Take 10 mg by mouth at bedtime. 05/11/21   [provider]  hydrochlorothiazide (HYDRODIURIL) 12.5 MG tablet Take 12.5 mg by mouth daily. 09/07/17   [provider]  lidocaine (LIDODERM) 5 % Place 1 patch onto the skin daily. Remove & Discard patch within 12 hours or as directed by MD 12/10/21   Sponseller, Eugene Gavia, PA-C  losartan (COZAAR) 100 MG tablet Take 100 mg by mouth at bedtime.    [provider]  meloxicam (MOBIC) 15 MG tablet Take 15 mg by mouth daily. 12/21/21   [provider]  methocarbamol (ROBAXIN)  500 MG tablet Take 1 tablet (500 mg total) by mouth 2 (two) times daily. 12/20/21   Melene Plan, DO  ondansetron (ZOFRAN) 4 MG tablet Take 1 tablet (4 mg total) by mouth every 8 (eight) hours as needed for nausea or vomiting. 09/09/21   Pricilla Loveless, MD  pantoprazole (PROTONIX) 20 MG tablet Take 20 mg by mouth 2 (two) times daily. 05/11/21   [provider]  rosuvastatin (CRESTOR) 20 MG tablet Take 20 mg by mouth at bedtime. 05/11/21   [provider]  traZODone (DESYREL) 50 MG tablet Take 50 mg by mouth at bedtime. 12/08/21   [provider]  fluticasone (FLONASE) 50 MCG/ACT nasal spray Place 2 sprays into both nostrils daily. Patient not taking: Reported on 12/10/2019 11/04/19 01/31/20  Myra Rude, MD  ipratropium-albuterol (DUONEB) 0.5-2.5 (3) MG/3ML SOLN Take 3 mLs by nebulization every 6 (six) hours as needed (Wheezing and SOB). Patient not taking: Reported on 12/25/2019 12/11/19 01/31/20  Marguerita Merles Latif, DO      Allergies    Lisinopril    Review of Systems   Review of Systems  Constitutional:  Negative for fever.  Respiratory:  Negative for shortness of breath.   Cardiovascular:  Positive for chest pain.  Genitourinary:  Positive for flank pain.  All other systems reviewed and are negative.   Physical Exam Updated Vital Signs BP 133/71 (BP Location:  Left Arm)   Pulse 85   Temp 98.2 F (36.8 C) (Oral)   Resp 17   SpO2 97%  Physical Exam Vitals and nursing note reviewed.  Constitutional:      Appearance: She is well-developed.     Comments: Chronically ill-appearing but nontoxic  HENT:     Head: Normocephalic and atraumatic.  Eyes:     Pupils: Pupils are equal, round, and reactive to light.  Cardiovascular:     Rate and Rhythm: Normal rate and regular rhythm.     Heart sounds: Normal heart sounds.     Comments: Left chest wall tenderness to palpation, no crepitus, no overlying skin changes Pulmonary:     Effort: Pulmonary effort is  normal. No respiratory distress.     Breath sounds: No wheezing.  Abdominal:     Palpations: Abdomen is soft.     Tenderness: There is no abdominal tenderness. There is no right CVA tenderness or left CVA tenderness.  Musculoskeletal:     Cervical back: Neck supple.  Skin:    General: Skin is warm and dry.  Neurological:     Mental Status: She is alert and oriented to person, place, and time.  Psychiatric:        Mood and Affect: Mood normal.     ED Results / Procedures / Treatments   Labs (all labs ordered are listed, but only abnormal results are displayed) Labs Reviewed  URINALYSIS, ROUTINE W REFLEX MICROSCOPIC - Abnormal; Notable for the following components:      Result Value   APPearance HAZY (*)    Leukocytes,Ua LARGE (*)    Bacteria, UA RARE (*)    All other components within normal limits  CBC WITH DIFFERENTIAL/PLATELET - Abnormal; Notable for the following components:   WBC 12.6 (*)    Monocytes Absolute 1.3 (*)    All other components within normal limits  BASIC METABOLIC PANEL - Abnormal; Notable for the following components:   Glucose, Bld 114 (*)    Calcium 8.8 (*)    All other components within normal limits  TROPONIN I (HIGH SENSITIVITY)    EKG EKG Interpretation  Date/Time:  Tuesday December 28 2021 21:31:57 EDT Ventricular Rate:  89 PR Interval:  136 QRS Duration: 78 QT Interval:  364 QTC Calculation: 442 R Axis:   51 Text Interpretation: Normal sinus rhythm Normal ECG When compared with ECG of 11-Oct-2021 23:13, PREVIOUS ECG IS PRESENT Confirmed by Ross Marcus ([id number]) on 12/29/2021 2:27:00 AM  Radiology DG Chest 2 View  Result Date: 12/29/2021 CLINICAL DATA:  Cough and left-sided chest pain. EXAM: CHEST - 2 VIEW COMPARISON:  August 11, 2021 FINDINGS: The heart size and mediastinal contours are within normal limits. Both lungs are clear. The visualized skeletal structures are unremarkable. IMPRESSION: No active cardiopulmonary disease.  Electronically Signed   By: Aram Candela M.D.   On: 12/29/2021 03:04    Procedures Procedures    Medications Ordered in ED Medications - No data to display  ED Course/ Medical Decision Making/ A&P                           Medical Decision Making Amount and/or Complexity of Data Reviewed Labs: ordered. Radiology: ordered.   This patient presents to the ED for concern of left chest wall/flank pain, this involves an extensive number of treatment options, and is a complaint that carries with it a high risk of complications and morbidity.  I  considered the following differential and admission for this acute, potentially life threatening condition.  The differential diagnosis includes ACS, PE, pneumothorax, pneumonia, kidney stone, chest wall pain  MDM:    This is a 61 year old female who presents with left-sided chest wall and flank pain.  She is nontoxic and vital signs are reassuring.  She is in no respiratory distress.  Pain is reproducible on exam.  There are no overlying skin changes to suggest shingles.  She does not have any CVA tenderness and urinalysis is not suggestive of pyelonephritis.  Doubt kidney stone.  EKG is nonischemic in nature and without arrhythmia.  Troponin x1 negative.  Doubt ACS.  Chest x-ray without pneumothorax or pneumonia.  Low suspicion for PE.  Given reproducible nature of pain, would recommend a trial of anti-inflammatories.  (Labs, imaging, consults)  Labs: I Ordered, and personally interpreted labs.  The pertinent results include: CBC, BMP, troponin  Imaging Studies ordered: I ordered imaging studies including chest x-ray I independently visualized and interpreted imaging. I agree with the radiologist interpretation  Additional history obtained from chart review.  External records from outside source obtained and reviewed including prior evaluations  Cardiac Monitoring: The patient was maintained on a cardiac monitor.  I personally viewed and  interpreted the cardiac monitored which showed an underlying rhythm of: Sinus rhythm  Reevaluation: After the interventions noted above, I reevaluated the patient and found that they have :improved  Social Determinants of Health: Disabled  Disposition: Discharge  Co morbidities that complicate the patient evaluation  Past Medical History:  Diagnosis Date   Anxiety    Asthma    COPD (chronic obstructive pulmonary disease) ([place])    High cholesterol    Hypertension      Medicines No orders of the defined types were placed in this encounter.   I have reviewed the patients home medicines and have made adjustments as needed  Problem List / ED Course: Problem List Items Addressed This Visit   None Visit Diagnoses     Chest wall pain    -  Primary                   Final Clinical Impression(s) / ED Diagnoses Final diagnoses:  Chest wall pain    Rx / DC Orders ED Discharge Orders     None         Merryl Hacker, MD 12/29/21 [phone]

## 2022-01-17 ENCOUNTER — Emergency Department (HOSPITAL_COMMUNITY)
Admission: EM | Admit: 2022-01-17 | Discharge: 2022-01-17 | Disposition: A | Discharge status: Home / Self Care | Payer: Medicare Other | Attending: Emergency Medicine | Admitting: Emergency Medicine

## 2022-01-17 ENCOUNTER — Emergency Department (HOSPITAL_COMMUNITY): Payer: Medicare Other

## 2022-01-17 DIAGNOSIS — J45909 Unspecified asthma, uncomplicated: Secondary | ICD-10-CM | POA: Insufficient documentation

## 2022-01-17 DIAGNOSIS — J449 Chronic obstructive pulmonary disease, unspecified: Secondary | ICD-10-CM | POA: Diagnosis not present

## 2022-01-17 DIAGNOSIS — M25512 Pain in left shoulder: Secondary | ICD-10-CM | POA: Insufficient documentation

## 2022-01-17 DIAGNOSIS — G8929 Other chronic pain: Secondary | ICD-10-CM | POA: Diagnosis not present

## 2022-01-17 DIAGNOSIS — I10 Essential (primary) hypertension: Secondary | ICD-10-CM | POA: Diagnosis not present

## 2022-01-17 DIAGNOSIS — Z79899 Other long term (current) drug therapy: Secondary | ICD-10-CM | POA: Insufficient documentation

## 2022-01-17 LAB — BASIC METABOLIC PANEL
Anion gap: 10 (ref 5–15)
BUN: 10 mg/dL (ref 8–23)
CO2: 29 mmol/L (ref 22–32)
Calcium: 9.7 mg/dL (ref 8.9–10.3)
Chloride: 104 mmol/L (ref 98–111)
Creatinine, Ser: 0.9 mg/dL (ref 0.44–1.00)
GFR, Estimated: >60 mL/min (ref >60)
Glucose, Bld: 103 mg/dL — ABNORMAL HIGH (ref 70–99)
Potassium: 4.6 mmol/L (ref 3.5–5.1)
Sodium: 143 mmol/L (ref 135–145)

## 2022-01-17 LAB — CBC WITH DIFFERENTIAL/PLATELET
Abs Immature Granulocytes: 0.03 10*3/uL (ref 0.00–0.07)
Basophils Absolute: 0.1 10*3/uL (ref 0.0–0.1)
Basophils Relative: 1 %
Eosinophils Absolute: 0.3 10*3/uL (ref 0.0–0.5)
Eosinophils Relative: 2 %
HCT: 44 % (ref 36.0–46.0)
Hemoglobin: 14.6 g/dL (ref 12.0–15.0)
Immature Granulocytes: 0 %
Lymphocytes Relative: 19 %
Lymphs Abs: 2.5 10*3/uL (ref 0.7–4.0)
MCH: 31.3 pg (ref 26.0–34.0)
MCHC: 33.2 g/dL (ref 30.0–36.0)
MCV: 94.2 fL (ref 80.0–100.0)
Monocytes Absolute: 0.8 10*3/uL (ref 0.1–1.0)
Monocytes Relative: 7 %
Neutro Abs: 9 10*3/uL — ABNORMAL HIGH (ref 1.7–7.7)
Neutrophils Relative %: 71 %
Platelets: 293 10*3/uL (ref 150–400)
RBC: 4.67 MIL/uL (ref 3.87–5.11)
RDW: 11.5 % (ref 11.5–15.5)
WBC: 12.6 10*3/uL — ABNORMAL HIGH (ref 4.0–10.5)
nRBC: 0 % (ref 0.0–0.2)

## 2022-01-17 LAB — TROPONIN I (HIGH SENSITIVITY)
Troponin I (High Sensitivity): 4 ng/L (ref <18)
Troponin I (High Sensitivity): 4 ng/L (ref <18)

## 2022-01-17 MED ORDER — NAPROXEN 375 MG PO TABS: 375.0000 mg | ORAL_TABLET | Freq: Two times a day (BID) | ORAL | 0 refills | Status: DC

## 2022-01-17 NOTE — ED Triage Notes (Signed)
Pt c/o L arm pain x2 mos, states pain has since gone into shoulder, neck & back. Partial ROM, CNS intact distal to injury. States she's been seen multiple times, unable to figure out cause

## 2022-01-17 NOTE — Discharge Instructions (Signed)
Your exam today is reassuring.  I have sent naproxen into the pharmacy for you.  Please follow-up with orthopedist and your primary care provider.  For any concerning symptoms return to the emergency room.  Do not take ibuprofen along with the naproxen.

## 2022-01-17 NOTE — ED Provider Notes (Signed)
[facility] EMERGENCY DEPARTMENT Provider Note   CSN: [id number] Arrival date & time: 01/17/22  1523     History  No chief complaint on file.   Ashley Hodge is a 61 y.o. female.  61 year old female presents today for evaluation of left shoulder pain.  States this has been ongoing for couple months.  Has been previously evaluated by orthopedist and primary care provider.  States she is wanting to get a brace for her left shoulder but has not been able to find one over-the-counter.  Has been taking Flexeril without significant relief.  Has previously taken ibuprofen but only for 2 days.  Denies neck pain, paresthesias of the left upper extremity, or weakness.  She states her primary care provider told her this could be rotator cuff injury.  States she has a follow-up with orthopedist coming up soon.  Denies any other new complaints.  States she is primarily here to get a brace.  She denies chest pain, shortness of breath.  She denies any known injury to her shoulder prior to onset a couple months ago.  The history is provided by the patient. No language interpreter was used.       Home Medications Prior to Admission medications   Medication Sig Start Date End Date Taking? Authorizing Provider  acetaminophen (TYLENOL) 500 MG tablet Take 1,000 mg by mouth daily as needed (Pain).    [provider]  albuterol (VENTOLIN HFA) 108 (90 Base) MCG/ACT inhaler Inhale 3 puffs into the lungs daily as needed for wheezing or shortness of breath. 04/22/21   [provider]  cephALEXin (KEFLEX) 500 MG capsule Take 1 capsule (500 mg total) by mouth 4 (four) times daily. 12/14/21   Blue, Soijett A, PA-C  clindamycin (CLEOCIN) 300 MG capsule Take 300 mg by mouth every 8 (eight) hours. 12/23/21   [provider]  ezetimibe (ZETIA) 10 MG tablet Take 10 mg by mouth at bedtime. 05/11/21   [provider]  hydrochlorothiazide (HYDRODIURIL) 12.5 MG tablet Take  12.5 mg by mouth daily. 09/07/17   [provider]  lidocaine (LIDODERM) 5 % Place 1 patch onto the skin daily. Remove & Discard patch within 12 hours or as directed by MD 12/10/21   Sponseller, Eugene Gavia, PA-C  losartan (COZAAR) 100 MG tablet Take 100 mg by mouth at bedtime.    [provider]  meloxicam (MOBIC) 15 MG tablet Take 15 mg by mouth daily. 12/21/21   [provider]  methocarbamol (ROBAXIN) 500 MG tablet Take 1 tablet (500 mg total) by mouth 2 (two) times daily. 12/20/21   Melene Plan, DO  ondansetron (ZOFRAN) 4 MG tablet Take 1 tablet (4 mg total) by mouth every 8 (eight) hours as needed for nausea or vomiting. 09/09/21   Pricilla Loveless, MD  pantoprazole (PROTONIX) 20 MG tablet Take 20 mg by mouth 2 (two) times daily. 05/11/21   [provider]  rosuvastatin (CRESTOR) 20 MG tablet Take 20 mg by mouth at bedtime. 05/11/21   [provider]  traZODone (DESYREL) 50 MG tablet Take 50 mg by mouth at bedtime. 12/08/21   [provider]  fluticasone (FLONASE) 50 MCG/ACT nasal spray Place 2 sprays into both nostrils daily. Patient not taking: Reported on 12/10/2019 11/04/19 01/31/20  Myra Rude, MD  ipratropium-albuterol (DUONEB) 0.5-2.5 (3) MG/3ML SOLN Take 3 mLs by nebulization every 6 (six) hours as needed (Wheezing and SOB). Patient not taking: Reported on 12/25/2019 12/11/19 01/31/20  Marguerita Merles  Latif, DO      Allergies    Lisinopril    Review of Systems   Review of Systems  Constitutional:  Negative for fever.  Respiratory:  Negative for shortness of breath.   Cardiovascular:  Negative for chest pain.  Musculoskeletal:  Positive for arthralgias. Negative for joint swelling, neck pain and neck stiffness.  Neurological:  Negative for weakness and numbness.  All other systems reviewed and are negative.   Physical Exam Updated Vital Signs BP 138/72 (BP Location: Left Arm)   Pulse 88   Temp 98.2 F (36.8 C) (Oral)   Resp 20    SpO2 95%  Physical Exam Vitals and nursing note reviewed.  Constitutional:      General: She is not in acute distress.    Appearance: Normal appearance. She is not ill-appearing.  HENT:     Head: Normocephalic and atraumatic.     Nose: Nose normal.  Eyes:     General: No scleral icterus.    Extraocular Movements: Extraocular movements intact.     Conjunctiva/sclera: Conjunctivae normal.  Cardiovascular:     Rate and Rhythm: Normal rate and regular rhythm.     Pulses: Normal pulses.     Heart sounds: Normal heart sounds.  Pulmonary:     Effort: Pulmonary effort is normal. No respiratory distress.     Breath sounds: Normal breath sounds. No wheezing or rales.  Abdominal:     General: There is no distension.     Tenderness: There is no abdominal tenderness.  Musculoskeletal:        General: Normal range of motion.     Cervical back: Normal range of motion.     Comments: Full range of motion bilateral upper extremities.  Mild tenderness palpation present of the left shoulder.  5/5 strength in bilateral upper extremities and extensor and flexor muscle groups.  Reproduction of pain with empty can test.  neurovascularly intact.  Cervical spine without tenderness to palpation.  Skin:    General: Skin is warm and dry.  Neurological:     General: No focal deficit present.     Mental Status: She is alert. Mental status is at baseline.     ED Results / Procedures / Treatments   Labs (all labs ordered are listed, but only abnormal results are displayed) Labs Reviewed  CBC WITH DIFFERENTIAL/PLATELET - Abnormal; Notable for the following components:      Result Value   WBC 12.6 (*)    Neutro Abs 9.0 (*)    All other components within normal limits  BASIC METABOLIC PANEL - Abnormal; Notable for the following components:   Glucose, Bld 103 (*)    All other components within normal limits  TROPONIN I (HIGH SENSITIVITY)  TROPONIN I (HIGH SENSITIVITY)    EKG None  Radiology DG  Shoulder Left  Result Date: 01/17/2022 CLINICAL DATA:  Left shoulder pain.  Arm pain for 2 months. EXAM: LEFT SHOULDER - 2+ VIEW COMPARISON:  Radiograph 10/26/2021 FINDINGS: There is no evidence of fracture or dislocation. There is no evidence of arthropathy or other focal bone abnormality. Soft tissues are unremarkable. IMPRESSION: Unremarkable radiographs of the left shoulder. Electronically Signed   By: Narda Rutherford M.D.   On: 01/17/2022 16:19    Procedures Procedures    Medications Ordered in ED Medications - No data to display  ED Course/ Medical Decision Making/ A&P  Medical Decision Making  Medical Decision Making / ED Course   This patient presents to the ED for concern of left shoulder pain, this involves an extensive number of treatment options, and is a complaint that carries with it a high risk of complications and morbidity.  The differential diagnosis includes rotator cuff injury, muscle strain, cervical radiculopathy, ACS  MDM: 61 year old female presents today for evaluation of left shoulder pain.  This is a chronic issue.  Has been ongoing for 2 months.  She has seen her PCP and orthopedist.  She states she has been unable to find a brace over-the-counter and would like something to immobilize her shoulder.  Tenderness palpation of the left shoulder is present on exam.  Empty can test with tenderness palpation on the left upper extremity.  Overall good strength in bilateral upper extremities.  Cervical spine without tenderness to palpation.  This is likely rotator cuff injury, or muscle strain.  Continue taking Flexeril.  Will prescribe naproxen.  Discussed not to take ibuprofen along with this.  Discussed use of Tylenol as needed for breakthrough pain.  She will call her orthopedist office tomorrow to determine when her follow-up is.  She states she knows that it is coming up soon.  Sling provided.  Naproxen prescribed.  Patient is appropriate for  discharge.  Discharged in stable condition.  ACS work-up done and MSE which has been reassuring.  Doubt ACS.  Patient denies chest pain, shortness of breath.   Lab Tests: -I ordered, reviewed, and interpreted labs.   The pertinent results include:   Labs Reviewed  CBC WITH DIFFERENTIAL/PLATELET - Abnormal; Notable for the following components:      Result Value   WBC 12.6 (*)    Neutro Abs 9.0 (*)    All other components within normal limits  BASIC METABOLIC PANEL - Abnormal; Notable for the following components:   Glucose, Bld 103 (*)    All other components within normal limits  TROPONIN I (HIGH SENSITIVITY)  TROPONIN I (HIGH SENSITIVITY)      EKG  EKG Interpretation  Date/Time:  Monday January 17 2022 15:59:03 EDT Ventricular Rate:  97 PR Interval:  132 QRS Duration: 80 QT Interval:  340 QTC Calculation: 431 R Axis:   54 Text Interpretation: Normal sinus rhythm Low voltage QRS Borderline ECG When compared with ECG of 28-Dec-2021 21:31, PREVIOUS ECG IS PRESENT Confirmed by Ernie Avena ([id number]) on 01/17/2022 8:55:06 PM         Imaging Studies ordered: I ordered imaging studies including left shoulder x-ray I independently visualized and interpreted imaging. I agree with the radiologist interpretation   Medicines ordered and prescription drug management: No orders of the defined types were placed in this encounter.   -I have reviewed the patients home medicines and have made adjustments as needed  Reevaluation: After the interventions noted above, I reevaluated the patient and found that they have :stayed the same  Co morbidities that complicate the patient evaluation  Past Medical History:  Diagnosis Date   Anxiety    Asthma    COPD (chronic obstructive pulmonary disease) (HCC)    High cholesterol    Hypertension       Dispostion: Patient is stable for discharge.  Discharged in stable condition.  Return precautions discussed.   Final Clinical  Impression(s) / ED Diagnoses Final diagnoses:  Chronic left shoulder pain    Rx / DC Orders ED Discharge Orders          Ordered  naproxen (NAPROSYN) 375 MG tablet  2 times daily        01/17/22 2108              Marita Kansas, PA-C 01/17/22 2111    Ernie Avena, MD 01/17/22 2238

## 2022-01-17 NOTE — ED Notes (Signed)
Pt refused DC vitals 

## 2022-01-17 NOTE — ED Notes (Signed)
Patient verbalizes understanding of discharge instructions. Opportunity for questioning and answers were provided. Armband removed by staff, pt discharged from ED via wheelchair.  

## 2022-01-17 NOTE — ED Provider Triage Note (Signed)
Emergency Medicine Provider Triage Evaluation Note  Ashley Hodge , a 61 y.o. female  was evaluated in triage.  Pt complains of left shoulder pain that goes into her neck and the left side of her back.  It started 2 months ago, atraumatic.  States she is been seen for this "multiple times".  She is been seen by ED, orthopedics.  States no new change today but "wants to get to the bottom of it".  She denies any chest pain, numbness, tingling, shortness of breath, pain between her shoulder blades, syncope, presyncope..  Review of Systems  Per HPI  Physical Exam  BP 139/76 (BP Location: Right Arm)   Pulse (!) 105   Temp 98.8 F (37.1 C) (Oral)   Resp 16   SpO2 93%  Gen:   Awake, no distress   Resp:  Normal effort  MSK:   Reproducible tenderness to shoulder, ROM roughly intact. Other:  S1-S2.  Radial pulse 2+, brisk cap refill.  Medical Decision Making  Medically screening exam initiated at 3:47 PM.  Appropriate orders placed.  Ashley Hodge was informed that the remainder of the evaluation will be completed by another provider, this initial triage assessment does not replace that evaluation, and the importance of remaining in the ED until their evaluation is complete.  EKG in the off chance of atypical ACS.  Will get plain film of shoulder.   Theron Arista, PA-C 01/17/22 1549

## 2022-01-17 NOTE — ED Notes (Signed)
Pt stating she needs DC paper work so that she can catch last bus - PA notified and pt made aware that paperwork would be done shortly - pt stating she is just going to leave - PA notified

## 2022-02-01 ENCOUNTER — Encounter (HOSPITAL_COMMUNITY): Payer: Self-pay | Admitting: *Deleted

## 2022-02-01 ENCOUNTER — Emergency Department (HOSPITAL_COMMUNITY): Payer: Medicare Other

## 2022-02-01 ENCOUNTER — Emergency Department (HOSPITAL_COMMUNITY)
Admission: EM | Admit: 2022-02-01 | Discharge: 2022-02-01 | Disposition: A | Discharge status: Home / Self Care | Payer: Medicare Other | Attending: Emergency Medicine | Admitting: Emergency Medicine

## 2022-02-01 ENCOUNTER — Other Ambulatory Visit: Payer: Self-pay

## 2022-02-01 DIAGNOSIS — I10 Essential (primary) hypertension: Secondary | ICD-10-CM | POA: Insufficient documentation

## 2022-02-01 DIAGNOSIS — Z79899 Other long term (current) drug therapy: Secondary | ICD-10-CM | POA: Diagnosis not present

## 2022-02-01 DIAGNOSIS — K529 Noninfective gastroenteritis and colitis, unspecified: Secondary | ICD-10-CM | POA: Insufficient documentation

## 2022-02-01 DIAGNOSIS — Z20822 Contact with and (suspected) exposure to covid-19: Secondary | ICD-10-CM | POA: Diagnosis not present

## 2022-02-01 DIAGNOSIS — D72829 Elevated white blood cell count, unspecified: Secondary | ICD-10-CM | POA: Insufficient documentation

## 2022-02-01 DIAGNOSIS — N390 Urinary tract infection, site not specified: Secondary | ICD-10-CM | POA: Diagnosis not present

## 2022-02-01 DIAGNOSIS — K8020 Calculus of gallbladder without cholecystitis without obstruction: Secondary | ICD-10-CM | POA: Diagnosis not present

## 2022-02-01 DIAGNOSIS — R109 Unspecified abdominal pain: Secondary | ICD-10-CM

## 2022-02-01 DIAGNOSIS — R197 Diarrhea, unspecified: Secondary | ICD-10-CM

## 2022-02-01 DIAGNOSIS — J449 Chronic obstructive pulmonary disease, unspecified: Secondary | ICD-10-CM | POA: Insufficient documentation

## 2022-02-01 DIAGNOSIS — R1013 Epigastric pain: Secondary | ICD-10-CM | POA: Diagnosis present

## 2022-02-01 LAB — CBC
HCT: 44.8 % (ref 36.0–46.0)
Hemoglobin: 14.6 g/dL (ref 12.0–15.0)
MCH: 30.7 pg (ref 26.0–34.0)
MCHC: 32.6 g/dL (ref 30.0–36.0)
MCV: 94.3 fL (ref 80.0–100.0)
Platelets: 286 10*3/uL (ref 150–400)
RBC: 4.75 MIL/uL (ref 3.87–5.11)
RDW: 11.3 % — ABNORMAL LOW (ref 11.5–15.5)
WBC: 15.1 10*3/uL — ABNORMAL HIGH (ref 4.0–10.5)
nRBC: 0 % (ref 0.0–0.2)

## 2022-02-01 LAB — URINALYSIS, ROUTINE W REFLEX MICROSCOPIC
Bilirubin Urine: NEGATIVE
Glucose, UA: NEGATIVE mg/dL
Hgb urine dipstick: NEGATIVE
Ketones, ur: NEGATIVE mg/dL
Nitrite: NEGATIVE
Protein, ur: NEGATIVE mg/dL
Specific Gravity, Urine: 1.029 (ref 1.005–1.030)
pH: 6 (ref 5.0–8.0)

## 2022-02-01 LAB — RESP PANEL BY RT-PCR (FLU A&B, COVID) ARPGX2
Influenza A by PCR: NEGATIVE
Influenza B by PCR: NEGATIVE
SARS Coronavirus 2 by RT PCR: NEGATIVE

## 2022-02-01 LAB — LIPASE, BLOOD: Lipase: 33 U/L (ref 11–51)

## 2022-02-01 LAB — COMPREHENSIVE METABOLIC PANEL
ALT: 22 U/L (ref 0–44)
AST: 21 U/L (ref 15–41)
Albumin: 3.9 g/dL (ref 3.5–5.0)
Alkaline Phosphatase: 85 U/L (ref 38–126)
Anion gap: 9 (ref 5–15)
BUN: 15 mg/dL (ref 8–23)
CO2: 26 mmol/L (ref 22–32)
Calcium: 9.2 mg/dL (ref 8.9–10.3)
Chloride: 106 mmol/L (ref 98–111)
Creatinine, Ser: 0.73 mg/dL (ref 0.44–1.00)
GFR, Estimated: >60 mL/min (ref >60)
Glucose, Bld: 118 mg/dL — ABNORMAL HIGH (ref 70–99)
Potassium: 3.9 mmol/L (ref 3.5–5.1)
Sodium: 141 mmol/L (ref 135–145)
Total Bilirubin: 0.4 mg/dL (ref 0.3–1.2)
Total Protein: 7.2 g/dL (ref 6.5–8.1)

## 2022-02-01 LAB — TROPONIN I (HIGH SENSITIVITY): Troponin I (High Sensitivity): 2 ng/L (ref <18)

## 2022-02-01 MED ORDER — LACTATED RINGERS IV BOLUS
1000.0000 mL | Freq: Once | INTRAVENOUS | Status: AC
  Administered 2022-02-01: 1000 mL via INTRAVENOUS

## 2022-02-01 MED ORDER — ALUM & MAG HYDROXIDE-SIMETH 200-200-20 MG/5ML PO SUSP
30.0000 mL | Freq: Once | ORAL | Status: AC
  Administered 2022-02-01: 30 mL via ORAL
  Filled 2022-02-01: qty 30

## 2022-02-01 MED ORDER — IOHEXOL 300 MG/ML  SOLN
100.0000 mL | Freq: Once | INTRAMUSCULAR | Status: AC | PRN
  Administered 2022-02-01: 100 mL via INTRAVENOUS

## 2022-02-01 MED ORDER — SODIUM CHLORIDE (PF) 0.9 % IJ SOLN
INTRAMUSCULAR | Status: AC
  Filled 2022-02-01: qty 50

## 2022-02-01 MED ORDER — CEFDINIR 300 MG PO CAPS: 300.0000 mg | ORAL_CAPSULE | Freq: Two times a day (BID) | ORAL | 0 refills | Status: AC

## 2022-02-01 MED ORDER — OXYCODONE HCL 5 MG PO TABS: 5.0000 mg | ORAL_TABLET | Freq: Four times a day (QID) | ORAL | 0 refills | Status: DC | PRN

## 2022-02-01 MED ORDER — OXYCODONE HCL 5 MG PO TABS
5.0000 mg | ORAL_TABLET | Freq: Once | ORAL | Status: AC
  Administered 2022-02-01: 5 mg via ORAL
  Filled 2022-02-01: qty 1

## 2022-02-01 MED ORDER — ONDANSETRON 4 MG PO TBDP: 4.0000 mg | ORAL_TABLET | Freq: Three times a day (TID) | ORAL | 0 refills | Status: DC | PRN

## 2022-02-01 NOTE — ED Provider Notes (Signed)
[facility] DEPT Provider Note   CSN: [id number] Arrival date & time: 02/01/22  0820     History  Chief Complaint  Patient presents with   Abdominal Pain   Diarrhea    PATSI Ashley Hodge is a 61 y.o. female. With pmh HTN, HLD, COPD who presents with upper epigastric discomfort associated with nonbloody diarrhea.  Patient ate some fish and veggies last night and then when she went to bed she had some stabbing upper epigastric nonradiating pain associated with 2 episodes of nonbloody diarrhea.  She denies any melena or hematochezia.  Denies any nausea, no vomiting, no fevers, no cough, no congestion, no rhinorrhea and no shortness of breath.  She has not taken any medications for discomfort.  She does take daily gastric reflux medications.  She called her nurse hotline who said she should go to the ER because it could be related to her heart.   Abdominal Pain Associated symptoms: diarrhea   Diarrhea Associated symptoms: abdominal pain        Home Medications Prior to Admission medications   Medication Sig Start Date End Date Taking? Authorizing Provider  acetaminophen (TYLENOL) 500 MG tablet Take 1,000 mg by mouth daily as needed (Pain).   Yes [provider]  cefdinir (OMNICEF) 300 MG capsule Take 1 capsule (300 mg total) by mouth 2 (two) times daily for 7 days. 02/01/22 02/08/22 Yes Elgie Congo, MD  cyclobenzaprine (FLEXERIL) 5 MG tablet Take 5 mg by mouth 2 (two) times daily as needed for muscle spasms. 01/10/22  Yes [provider]  ezetimibe (ZETIA) 10 MG tablet Take 10 mg by mouth at bedtime. 05/11/21  Yes [provider]  hydrochlorothiazide (HYDRODIURIL) 12.5 MG tablet Take 12.5 mg by mouth daily. 09/07/17  Yes [provider]  losartan (COZAAR) 100 MG tablet Take 100 mg by mouth at bedtime.   Yes [provider]  ondansetron (ZOFRAN) 4 MG tablet Take 1 tablet (4 mg total) by mouth every 8 (eight)  hours as needed for nausea or vomiting. Patient taking differently: Take 4 mg by mouth daily as needed for nausea or vomiting. 09/09/21  Yes Sherwood Gambler, MD  ondansetron (ZOFRAN-ODT) 4 MG disintegrating tablet Take 1 tablet (4 mg total) by mouth every 8 (eight) hours as needed for nausea or vomiting. 02/01/22  Yes Elgie Congo, MD  oxyCODONE (ROXICODONE) 5 MG immediate release tablet Take 1 tablet (5 mg total) by mouth every 6 (six) hours as needed for up to 10 doses for severe pain. 02/01/22  Yes Elgie Congo, MD  pantoprazole (PROTONIX) 20 MG tablet Take 20 mg by mouth 2 (two) times daily. 05/11/21  Yes [provider]  rosuvastatin (CRESTOR) 20 MG tablet Take 20 mg by mouth at bedtime. 05/11/21  Yes [provider]  albuterol (VENTOLIN HFA) 108 (90 Base) MCG/ACT inhaler Inhale 3 puffs into the lungs daily as needed for wheezing or shortness of breath. Patient not taking: Reported on 02/01/2022 04/22/21   [provider]  cephALEXin (KEFLEX) 500 MG capsule Take 1 capsule (500 mg total) by mouth 4 (four) times daily. Patient not taking: Reported on 02/01/2022 12/14/21   Blue, Soijett A, PA-C  clindamycin (CLEOCIN) 300 MG capsule Take 300 mg by mouth every 8 (eight) hours. Patient not taking: Reported on 02/01/2022 12/23/21   [provider]  lidocaine (LIDODERM) 5 % Place 1 patch onto the skin daily. Remove & Discard patch within 12 hours or as directed by  MD Patient not taking: Reported on 02/01/2022 12/10/21   Sponseller, Gypsy Balsam, PA-C  methocarbamol (ROBAXIN) 500 MG tablet Take 1 tablet (500 mg total) by mouth 2 (two) times daily. Patient not taking: Reported on 02/01/2022 12/20/21   Deno Etienne, DO  naproxen (NAPROSYN) 375 MG tablet Take 1 tablet (375 mg total) by mouth 2 (two) times daily. Patient not taking: Reported on 02/01/2022 01/17/22   Evlyn Courier, PA-C  traZODone (DESYREL) 50 MG tablet Take 50 mg by mouth at bedtime. Patient not taking: Reported on  02/01/2022 12/08/21   [provider]  fluticasone (FLONASE) 50 MCG/ACT nasal spray Place 2 sprays into both nostrils daily. Patient not taking: Reported on 12/10/2019 11/04/19 01/31/20  Rosemarie Ax, MD  ipratropium-albuterol (DUONEB) 0.5-2.5 (3) MG/3ML SOLN Take 3 mLs by nebulization every 6 (six) hours as needed (Wheezing and SOB). Patient not taking: Reported on 12/25/2019 12/11/19 01/31/20  Raiford Noble Latif, DO      Allergies    Lisinopril    Review of Systems   Review of Systems  Gastrointestinal:  Positive for abdominal pain and diarrhea.    Physical Exam Updated Vital Signs BP 133/81 (BP Location: Left Arm)   Pulse 93   Temp 97.7 F (36.5 C) (Oral)   Resp 14   Ht 5' (1.524 m)   Wt 72.6 kg   SpO2 100%   BMI 31.25 kg/m  Physical Exam Constitutional: Alert and oriented. Well appearing and in no distress. Eyes: Conjunctivae are normal. ENT      Head: Normocephalic and atraumatic.      Nose: No congestion.      Mouth/Throat: Mucous membranes are moist.      Neck: No stridor. Cardiovascular: S1, S2,  Normal and symmetric distal pulses are present in all extremities.Warm and well perfused. Respiratory: Normal respiratory effort. Breath sounds are normal.  O2 sat 98 on RA. Gastrointestinal: Soft and nontender although endorsing location of pain in the epigastrium region. There is no CVA tenderness. Musculoskeletal: Normal range of motion in all extremities. Neurologic: Normal speech and language. No gross focal neurologic deficits are appreciated. Skin: Skin is warm, dry and intact. No rash noted. Psychiatric: Mood and affect are normal. Speech and behavior are normal.  ED Results / Procedures / Treatments   Labs (all labs ordered are listed, but only abnormal results are displayed) Labs Reviewed  COMPREHENSIVE METABOLIC PANEL - Abnormal; Notable for the following components:      Result Value   Glucose, Bld 118 (*)    All other components within normal  limits  CBC - Abnormal; Notable for the following components:   WBC 15.1 (*)    RDW 11.3 (*)    All other components within normal limits  URINALYSIS, ROUTINE W REFLEX MICROSCOPIC - Abnormal; Notable for the following components:   Color, Urine STRAW (*)    Leukocytes,Ua LARGE (*)    Bacteria, UA RARE (*)    All other components within normal limits  RESP PANEL BY RT-PCR (FLU A&B, COVID) ARPGX2  LIPASE, BLOOD  TROPONIN I (HIGH SENSITIVITY)    EKG EKG Interpretation  Date/Time:  Tuesday February 01 2022 08:50:21 EDT Ventricular Rate:  100 PR Interval:  136 QRS Duration: 96 QT Interval:  353 QTC Calculation: 456 R Axis:   46 Text Interpretation: Sinus tachycardia Low voltage, precordial leads No acute changes Confirmed by Georgina Snell [phone]) on 02/01/2022 8:55:08 AM  Radiology CT ABDOMEN PELVIS W CONTRAST  Result Date: 02/01/2022 CLINICAL DATA:  Upper abdominal pain and diarrhea. EXAM: CT ABDOMEN AND PELVIS WITH CONTRAST TECHNIQUE: Multidetector CT imaging of the abdomen and pelvis was performed using the standard protocol following bolus administration of intravenous contrast. RADIATION DOSE REDUCTION: This exam was performed according to the departmental dose-optimization program which includes automated exposure control, adjustment of the mA and/or kV according to patient size and/or use of iterative reconstruction technique. CONTRAST:  132mL OMNIPAQUE IOHEXOL 300 MG/ML  SOLN COMPARISON:  None Available. FINDINGS: Lower chest: The lung bases are clear of acute process. No pleural effusion or pulmonary lesions. The heart is normal in size. No pericardial effusion. The distal esophagus and aorta are unremarkable. Hepatobiliary: No hepatic lesions or intrahepatic biliary dilatation. Small calcified gallstones are noted the gallbladder but no CT findings to suggest acute cholecystitis. No common bile duct dilatation. Pancreas: No mass, inflammation or ductal dilatation. Spleen:  Normal size.  No focal lesions. Adrenals/Urinary Tract: The adrenal glands are normal. No renal lesions, renal calculi or hydronephrosis. No collecting system abnormalities on the delayed images. The bladder is unremarkable. Stomach/Bowel: The stomach, duodenum, and proximal and mid small bowel are unremarkable. There is a fairly long segment of inflammatory or infectious ileitis involving the distal and terminal ileum. There is mucosal and serosal enhancement with submucosal edema. Crohn's disease is certainly a possibility. The colon is unremarkable. No mass or obstruction or inflammation. The appendix is normal. Vascular/Lymphatic: Significant age advanced atherosclerotic calcifications involving the aorta, iliac arteries and branch vessels. No aneurysm. The major venous structures are patent. No mesenteric or retroperitoneal mass or adenopathy. Reproductive: 3 cm fundal fibroid is noted. Both ovaries are normal. Other: No pelvic mass or adenopathy. No free pelvic fluid collections. No inguinal mass or adenopathy. No abdominal wall hernia or subcutaneous lesions. Musculoskeletal: No significant bony findings. Mild degenerative anterolisthesis of L4 due to facet disease. IMPRESSION: 1. Fairly long segment of inflammatory or infectious ileitis involving the distal and terminal ileum. Crohn's disease is certainly a possibility. 2. Cholelithiasis without CT findings to suggest acute cholecystitis. 3. Significant age advanced atherosclerotic calcifications involving the aorta, iliac arteries and branch vessels. 4. 3 cm fundal fibroid. Aortic Atherosclerosis (ICD10-I70.0). Electronically Signed   By: Marijo Sanes M.D.   On: 02/01/2022 10:58    Procedures Procedures  Remain on constant cardiac monitoring, initial sinus tachycardia then resolved to normal sinus rhythm. Medications Ordered in ED Medications  lactated ringers bolus 1,000 mL (0 mLs Intravenous Stopped 02/01/22 1157)  alum & mag hydroxide-simeth  (MAALOX/MYLANTA) 200-200-20 MG/5ML suspension 30 mL (30 mLs Oral Given 02/01/22 0856)  sodium chloride (PF) 0.9 % injection (  Given 02/01/22 1027)  iohexol (OMNIPAQUE) 300 MG/ML solution 100 mL (100 mLs Intravenous Contrast Given 02/01/22 1038)  oxyCODONE (Oxy IR/ROXICODONE) immediate release tablet 5 mg (5 mg Oral Given 02/01/22 1158)    ED Course/ Medical Decision Making/ A&P Clinical Course as of 02/01/22 1246  Tue Feb 01, 2022  1245 Patient's work-up remarkable for a leukocytosis 15.1 and a UA suggestive of UTI with large leukocyte esterase and 6-10 WBCs and rare bacteria.  Her CTAP showed evidence of ileitis and cholelithiasis without cholecystitis.  She has no transaminitis and normal lipase.  COVID and flu negative.  EKG reassuring with no ischemic changes and unremarkable high sensitive troponin 2.  Do not think ACS.  Patient reassessed and was feeling much better and tolerating p.o. and eating a meal.  She would like to go home.  I have referred her to gastroenterology for ileitis and  inflammatory bowel disease work-up and general surgery for cholelithiasis.  I have given her a short prescription of oxycodone for pain control and Zofran for nausea or vomiting.  I have also written a 7-day course of Omnicef for UTI.  Strict return precaution discussed.  She is in agreement with plan. [VB]    Clinical Course User Index [VB] Elgie Congo, MD                           Medical Decision Making  KYNSLEI ART is a 61 y.o. female. With pmh HTN, HLD, COPD who presents with upper epigastric discomfort associated with nonbloody diarrhea.    Based on the patient's epigastric pain, differential includes but is not limited to  GERD, PUD, pancreatitis, enteritis, biliary disease among multiple other etiologies.  Less likely biliary disease with no right upper quadrant pain, no nausea, no vomiting. Less likely nephrolithiasis or pyelonephritis with no CVAT and no urinary symptoms. Less likely  pulmonary source such as pneumonia with no cardiopulmonary complaints, no hypoxia, and no increased work of breathing.  I have very low suspicion for atypical ACS based off location of pain and well appearance of patient and no associated concerning symptoms but will obtain screening EKG and troponin.  We will obtain CBC, CMP, lipase, EKG, troponin, UA.  We will give patient IV fluids and GI cocktail and reassess.   Amount and/or Complexity of Data Reviewed Labs: ordered. Radiology: ordered.  Risk OTC drugs. Prescription drug management.    Final Clinical Impression(s) / ED Diagnoses Final diagnoses:  Diarrhea, unspecified type  Abdominal pain, unspecified abdominal location  Calculus of gallbladder without cholecystitis without obstruction  Ileitis  Urinary tract infection without hematuria, site unspecified    Rx / DC Orders ED Discharge Orders          Ordered    Ambulatory referral to Gastroenterology        02/01/22 1243    Ambulatory referral to General Surgery        02/01/22 1243    oxyCODONE (ROXICODONE) 5 MG immediate release tablet  Every 6 hours PRN        02/01/22 1243    ondansetron (ZOFRAN-ODT) 4 MG disintegrating tablet  Every 8 hours PRN        02/01/22 1243    cefdinir (OMNICEF) 300 MG capsule  2 times daily        02/01/22 1243              Elgie Congo, MD 02/01/22 1246

## 2022-02-01 NOTE — ED Notes (Signed)
[employer] called at this time.  

## 2022-02-01 NOTE — ED Triage Notes (Signed)
BIB EMS with abd pain and diarrhea, elected no comfort meds due to event with son and adverse reaction. Diarrhea only this morning

## 2022-02-01 NOTE — Discharge Instructions (Addendum)
  You have been seen in the Emergency Department (ED) for abdominal pain. Your workup was generally reassuring and you are safe to be discharged at this time.    The CT scan did show inflammation of your small intestine called the ileum which can contribute to your symptoms and should typically run its course by referred you to gastroenterology for outpatient follow-up and colonoscopy and evaluation for possible inflammatory bowel disease.  The CT scan also showed evidence of gallstones.  I have referred you to gastroenterology for outpatient evaluation.  Your urine showed evidence of a UTI, take the antibiotics as prescribed.  Take Tylenol and ibuprofen as needed for pain but if pain is still uncontrolled you can take an oxycodone as needed.  Do not take while drinking alcohol or driving.  You can take the Zofran as needed for nausea or vomiting.  Make sure you drink plenty of fluids.  Please follow up with your primary care doctor as soon as possible regarding today's ED visit and the symptoms that are bothering you.  Return to the ED if your abdominal pain worsens or fails to improve, you develop bloody vomiting, bloody diarrhea, you are unable to tolerate fluids due to vomiting, fever greater than 101, or any other concerning symptoms.

## 2022-02-01 NOTE — ED Notes (Signed)
Patient transported to CT 

## 2022-02-22 ENCOUNTER — Emergency Department (HOSPITAL_COMMUNITY): Payer: Medicare Other

## 2022-02-22 ENCOUNTER — Encounter (HOSPITAL_COMMUNITY): Payer: Self-pay | Admitting: *Deleted

## 2022-02-22 ENCOUNTER — Emergency Department (HOSPITAL_COMMUNITY)
Admission: EM | Admit: 2022-02-22 | Discharge: 2022-02-23 | Discharge status: Against Medical Advice | Payer: Medicare Other | Attending: Emergency Medicine | Admitting: Emergency Medicine

## 2022-02-22 ENCOUNTER — Other Ambulatory Visit: Payer: Self-pay

## 2022-02-22 DIAGNOSIS — R0789 Other chest pain: Secondary | ICD-10-CM | POA: Diagnosis present

## 2022-02-22 DIAGNOSIS — Z5321 Procedure and treatment not carried out due to patient leaving prior to being seen by health care provider: Secondary | ICD-10-CM | POA: Diagnosis not present

## 2022-02-22 DIAGNOSIS — M25512 Pain in left shoulder: Secondary | ICD-10-CM | POA: Insufficient documentation

## 2022-02-22 NOTE — ED Provider Triage Note (Signed)
Emergency Medicine Provider Triage Evaluation Note  Ashley Hodge , a 61 y.o. female  was evaluated in triage.  Pt complains of left-sided rib pain x1 day.  No injury.  Denies chest pain and shortness of breath.  Patient has chronic left shoulder pain.  Review of Systems  Positive: Rib pain Negative: Cp, SOB  Physical Exam  BP 132/78 (BP Location: Left Arm)   Pulse 96   Temp 98.9 F (37.2 C) (Oral)   Resp 18   SpO2 96%  Gen:   Awake, no distress   Resp:  Normal effort  MSK:   Moves extremities without difficulty  Other:  TTP to left ribs  Medical Decision Making  Medically screening exam initiated at 9:00 PM.  Appropriate orders placed.  Ashley Hodge was informed that the remainder of the evaluation will be completed by another provider, this initial triage assessment does not replace that evaluation, and the importance of remaining in the ED until their evaluation is complete.  Zenaida Deed, PA-C 02/22/22 2101

## 2022-02-22 NOTE — ED Triage Notes (Signed)
Pt arrives with pain in the left shoulder for about 3 months, she has been seen by ortho for the same. Since yesterday has been having left lateral rib pain (tender to palpation). Chronic cough.

## 2022-02-22 NOTE — ED Notes (Addendum)
Accidentally clicked off DG ribs

## 2022-02-23 DIAGNOSIS — R0789 Other chest pain: Secondary | ICD-10-CM | POA: Diagnosis not present

## 2022-02-23 MED ORDER — OXYCODONE-ACETAMINOPHEN 5-325 MG PO TABS
1.0000 | ORAL_TABLET | Freq: Once | ORAL | Status: AC
  Administered 2022-02-23: 1 via ORAL
  Filled 2022-02-23: qty 1

## 2022-02-23 NOTE — ED Notes (Signed)
Pt not responded for vitals recheck.

## 2022-02-23 NOTE — ED Notes (Signed)
Pt not responding for vitals recheck.

## 2022-02-23 NOTE — ED Notes (Signed)
Patient not responding for vitals.

## 2022-02-26 ENCOUNTER — Emergency Department (HOSPITAL_COMMUNITY)
Admission: EM | Admit: 2022-02-26 | Discharge: 2022-02-27 | Disposition: A | Discharge status: Home / Self Care | Payer: Medicare Other | Attending: Emergency Medicine | Admitting: Emergency Medicine

## 2022-02-26 ENCOUNTER — Encounter (HOSPITAL_COMMUNITY): Payer: Self-pay

## 2022-02-26 DIAGNOSIS — G8929 Other chronic pain: Secondary | ICD-10-CM | POA: Diagnosis not present

## 2022-02-26 DIAGNOSIS — Z79899 Other long term (current) drug therapy: Secondary | ICD-10-CM | POA: Diagnosis not present

## 2022-02-26 DIAGNOSIS — M25512 Pain in left shoulder: Secondary | ICD-10-CM | POA: Diagnosis not present

## 2022-02-26 DIAGNOSIS — J449 Chronic obstructive pulmonary disease, unspecified: Secondary | ICD-10-CM | POA: Diagnosis not present

## 2022-02-26 DIAGNOSIS — I10 Essential (primary) hypertension: Secondary | ICD-10-CM | POA: Insufficient documentation

## 2022-02-26 NOTE — ED Triage Notes (Signed)
Pt reports that she has been having pain in her L shoulder and down to her L flank area for the past few days, denies new injuries.

## 2022-02-26 NOTE — ED Provider Triage Note (Signed)
Emergency Medicine Provider Triage Evaluation Note  Ashley Hodge , a 61 y.o. female  was evaluated in triage.  Pt complains of and side pain.  Reports has been going on for a couple of months.  Has seen orthopedics for it and PCP but they are giving her medications that are not helping her.  She does report that everybody orders x-rays that she thinks she needs an MRI.  Says that the pain is worse when she breathes.  No history of DVT, PE, recent surgery, recent travel or estrogen products.  Review of Systems  Positive:  Negative: Chest pain, shortness of breath, abdominal or urinary symptoms  Physical Exam  BP (!) 146/80   Pulse (!) 107   Temp 98.2 F (36.8 C) (Oral)   Resp 20   SpO2 96%  Gen:   Awake, no distress   Resp:  Normal effort  MSK:   Moves extremities without difficulty  Other:  TTP left shoulder.  Also mild TTP to the left rib cage.  Range of motion intact.  Medical Decision Making  Medically screening exam initiated at 7:56 PM.  Appropriate orders placed.  Ashley Hodge was informed that the remainder of the evaluation will be completed by another provider, this initial triage assessment does not replace that evaluation, and the importance of remaining in the ED until their evaluation is complete.   Pain is worse with range of motion of the left shoulder.  Reproducible in her shoulder and chest wall.  Doubt emergent process.  I discussed that an MRI of this limb is unlikely secondary to being neurovascularly intact with ability to participate in range of motion.  She says that she is willing to wait for room for further work-up.  Do not believe this needs lab work at this time   Rhae Hammock, [place] 02/26/22 1959

## 2022-02-27 DIAGNOSIS — M25512 Pain in left shoulder: Secondary | ICD-10-CM | POA: Diagnosis not present

## 2022-02-27 MED ORDER — OXYCODONE-ACETAMINOPHEN 5-325 MG PO TABS
2.0000 | ORAL_TABLET | Freq: Once | ORAL | Status: AC
  Administered 2022-02-27: 2 via ORAL
  Filled 2022-02-27: qty 2

## 2022-02-27 MED ORDER — HYDROCODONE-ACETAMINOPHEN 5-325 MG PO TABS: 1.0000 | ORAL_TABLET | ORAL | 0 refills | Status: DC | PRN

## 2022-02-27 NOTE — Discharge Instructions (Signed)
Take the prescribed medication as directed. Follow-up with orthopedist.  I have given you contact information for [facility] as requested.  You can call Monday to get appt scheduled. Return to the ED for new or worsening symptoms.

## 2022-02-27 NOTE — ED Provider Notes (Signed)
[facility]-EMERGENCY DEPT Provider Note   CSN: [id number] Arrival date & time: 02/26/22  1921     History  Chief Complaint  Patient presents with   Shoulder Pain    Ashley Hodge is a 61 y.o. female.  The history is provided by the patient and medical records.  Flank Pain   61 y.o. F with hx COPD, hypertension, hyperlipidemia, presenting to the ED with left shoulder pain.  Reports this is been ongoing for about 3 months now.  States she has seen her PCP and orthopedics about this, has been placed on prednisone, muscle relaxers, and NSAIDs without much change.  She states she recently followed up with her PCP and requested MRI, however was told to go back to orthopedics.  When she attempted to call their office she was told she would need a new referral.  Patient reports continued pain, no new injury or trauma.  She has not had any falls.  Pain is mostly in the left shoulder, worse with lifting her arm above her head.  She denies any numbness or weakness of her left arm.  She is right-hand dominant.  Home Medications Prior to Admission medications   Medication Sig Start Date End Date Taking? Authorizing Provider  HYDROcodone-acetaminophen (NORCO/VICODIN) 5-325 MG tablet Take 1 tablet by mouth every 4 (four) hours as needed. 02/27/22  Yes Garlon Hatchet, PA-C  acetaminophen (TYLENOL) 500 MG tablet Take 1,000 mg by mouth daily as needed (Pain).    [provider]  albuterol (VENTOLIN HFA) 108 (90 Base) MCG/ACT inhaler Inhale 3 puffs into the lungs daily as needed for wheezing or shortness of breath. Patient not taking: Reported on 02/01/2022 04/22/21   [provider]  cephALEXin (KEFLEX) 500 MG capsule Take 1 capsule (500 mg total) by mouth 4 (four) times daily. Patient not taking: Reported on 02/01/2022 12/14/21   Blue, Soijett A, PA-C  clindamycin (CLEOCIN) 300 MG capsule Take 300 mg by mouth every 8 (eight) hours. Patient not taking: Reported on  02/01/2022 12/23/21   [provider]  cyclobenzaprine (FLEXERIL) 5 MG tablet Take 5 mg by mouth 2 (two) times daily as needed for muscle spasms. 01/10/22   [provider]  ezetimibe (ZETIA) 10 MG tablet Take 10 mg by mouth at bedtime. 05/11/21   [provider]  hydrochlorothiazide (HYDRODIURIL) 12.5 MG tablet Take 12.5 mg by mouth daily. 09/07/17   [provider]  lidocaine (LIDODERM) 5 % Place 1 patch onto the skin daily. Remove & Discard patch within 12 hours or as directed by MD Patient not taking: Reported on 02/01/2022 12/10/21   Sponseller, Lupe Carney R, PA-C  losartan (COZAAR) 100 MG tablet Take 100 mg by mouth at bedtime.    [provider]  methocarbamol (ROBAXIN) 500 MG tablet Take 1 tablet (500 mg total) by mouth 2 (two) times daily. Patient not taking: Reported on 02/01/2022 12/20/21   Melene Plan, DO  naproxen (NAPROSYN) 375 MG tablet Take 1 tablet (375 mg total) by mouth 2 (two) times daily. Patient not taking: Reported on 02/01/2022 01/17/22   Marita Kansas, PA-C  ondansetron (ZOFRAN) 4 MG tablet Take 1 tablet (4 mg total) by mouth every 8 (eight) hours as needed for nausea or vomiting. Patient taking differently: Take 4 mg by mouth daily as needed for nausea or vomiting. 09/09/21   Pricilla Loveless, MD  ondansetron (ZOFRAN-ODT) 4 MG disintegrating tablet Take 1 tablet (4 mg total) by mouth every 8 (eight) hours as  needed for nausea or vomiting. 02/01/22   Mardene Sayer, MD  oxyCODONE (ROXICODONE) 5 MG immediate release tablet Take 1 tablet (5 mg total) by mouth every 6 (six) hours as needed for up to 10 doses for severe pain. 02/01/22   Mardene Sayer, MD  pantoprazole (PROTONIX) 20 MG tablet Take 20 mg by mouth 2 (two) times daily. 05/11/21   [provider]  rosuvastatin (CRESTOR) 20 MG tablet Take 20 mg by mouth at bedtime. 05/11/21   [provider]  traZODone (DESYREL) 50 MG tablet Take 50 mg by mouth at bedtime. Patient  not taking: Reported on 02/01/2022 12/08/21   [provider]  fluticasone (FLONASE) 50 MCG/ACT nasal spray Place 2 sprays into both nostrils daily. Patient not taking: Reported on 12/10/2019 11/04/19 01/31/20  Myra Rude, MD  ipratropium-albuterol (DUONEB) 0.5-2.5 (3) MG/3ML SOLN Take 3 mLs by nebulization every 6 (six) hours as needed (Wheezing and SOB). Patient not taking: Reported on 12/25/2019 12/11/19 01/31/20  Marguerita Merles Latif, DO      Allergies    Lisinopril    Review of Systems   Review of Systems  Musculoskeletal:  Positive for arthralgias.  All other systems reviewed and are negative.   Physical Exam Updated Vital Signs BP (!) 140/81 (BP Location: Left Arm)   Pulse 86   Temp 97.6 F (36.4 C) (Oral)   Resp 18   SpO2 96%   Physical Exam Vitals and nursing note reviewed.  Constitutional:      Appearance: She is well-developed.  HENT:     Head: Normocephalic and atraumatic.  Eyes:     Conjunctiva/sclera: Conjunctivae normal.     Pupils: Pupils are equal, round, and reactive to light.  Cardiovascular:     Rate and Rhythm: Normal rate and regular rhythm.     Heart sounds: Normal heart sounds.  Pulmonary:     Effort: Pulmonary effort is normal.     Breath sounds: Normal breath sounds.  Abdominal:     General: Bowel sounds are normal.     Palpations: Abdomen is soft.  Musculoskeletal:        General: Normal range of motion.     Cervical back: Normal range of motion.     Comments: Left shoulder without any visible signs of trauma, no acute deformity, pain elicited with abduction of left shoulder, no pain with range of motion of elbow, wrist, or fingers, normal grip strength, normal distal sensation, normal radial pulse and cap refill, hand is warm and well-perfused  Skin:    General: Skin is warm and dry.  Neurological:     Mental Status: She is alert and oriented to person, place, and time.     ED Results / Procedures / Treatments   Labs (all labs  ordered are listed, but only abnormal results are displayed) Labs Reviewed - No data to display  EKG None  Radiology No results found.  Procedures Procedures    Medications Ordered in ED Medications  oxyCODONE-acetaminophen (PERCOCET/ROXICET) 5-325 MG per tablet 2 tablet (2 tablets Oral Given 02/27/22 0404)    ED Course/ Medical Decision Making/ A&P                           Medical Decision Making Risk Prescription drug management.   61 y.o. F here with left shoulder pain.  Ongoing problem x3 months, has already seen specialists about this and prescribed different medications without relief.  Returns  tonight requesting MRI as PCP was unable to order this for her.  Pain is overall unchanged in the left shoulder, worse with abduction, better with rest.  She does not have any focal deficits on exam--maintains normal strength, sensation, and hand is well-perfused.  Discussed with patient that MRI is unavailable at this hour and I do not feel this needs to be done emergently.  After talking with her further, it appears she was unhappy with prior orthopedic group that she was referred to ([facility]) and is asking for referral to [facility].  Given dose of percocet in the ED.  Will d/c home with information for orthopedic office as requested, advised she will still need to call and get appt set up.  Final Clinical Impression(s) / ED Diagnoses Final diagnoses:  Chronic left shoulder pain    Rx / DC Orders ED Discharge Orders          Ordered    HYDROcodone-acetaminophen (NORCO/VICODIN) 5-325 MG tablet  Every 4 hours PRN        02/27/22 0425              Larene Pickett, PA-C 02/27/22 0434    Molpus, Jenny Reichmann, MD 02/27/22 [phone]

## 2022-03-04 ENCOUNTER — Other Ambulatory Visit: Payer: Self-pay | Admitting: Family Medicine

## 2022-03-04 DIAGNOSIS — M25512 Pain in left shoulder: Secondary | ICD-10-CM

## 2022-03-04 NOTE — Therapy (Addendum)
OUTPATIENT PHYSICAL THERAPY UPPER EXTREMITY EVALUATION/DISCHARGE  PHYSICAL THERAPY DISCHARGE SUMMARY  Visits from Start of Care: 1  Current functional level related to goals / functional outcomes: No re-assessment of goals   Remaining deficits: Status unknown   Education / Equipment: N/A   Patient agrees to discharge. Patient goals were not met. Patient is being discharged due to  per attendance policy (3 no-show visits).    Patient Name: Ashley Hodge MRN: [id number] DOB:[birth year], 61 y.o., female Today's Date: 03/07/2022   PT End of Session - 03/07/22 1040     Visit Number 1    Number of Visits 13    Date for PT Re-Evaluation 04/23/22    Authorization Type MCR/MCD    PT Start Time 1101    PT Stop Time 1141    PT Time Calculation (min) 40 min    Activity Tolerance Patient limited by pain    Behavior During Therapy [facility] for tasks assessed/performed             Past Medical History:  Diagnosis Date   Anxiety    Asthma    COPD (chronic obstructive pulmonary disease) ([place])    High cholesterol    Hypertension    Past Surgical History:  Procedure Laterality Date   TUBAL LIGATION     Patient Active Problem List   Diagnosis Date Noted   DOE (dyspnea on exertion) 04/23/2020   COPD with chronic bronchitis and emphysema ([place]) 12/11/2019   Pre-diabetes 12/11/2019   Hypokalemia 12/10/2019   Essential hypertension 12/10/2019   Hyperlipidemia 12/10/2019    PCP: Roselee Nova, MD  REFERRING PROVIDER: Elsie Saas, MD  REFERRING DIAG: Lt RTC tendonitis and adhesive capsulitis   THERAPY DIAG:  Chronic left shoulder pain  Muscle weakness (generalized)  Abnormal posture  Rationale for Evaluation and Treatment Rehabilitation  ONSET DATE: June 2023   SUBJECTIVE:                                                                                                                                                                                       SUBJECTIVE STATEMENT: Patient reports onset of Lt shoulder pain about the 3-4 months ago without known cause. She received an injection at recent ortho visit that lasted one day. She feels that her pain is the same from initial onset. She reports occasional tingling about the entire LUE at night if she is sitting still or laying on the left side. She denies any previous injury to her neck or LUE. She has tried muscle relaxer and pain medication without any relief.   PERTINENT HISTORY: Anxiety COPD Hypertension   PAIN:  Are you  having pain? Yes: NPRS scale: 6 (at worst "100")/10 Pain location: Lt shoulder; upper trap Pain description: sharp Aggravating factors: pulling, reaching, laying on Lt side Relieving factors: "nothing"  PRECAUTIONS: None  WEIGHT BEARING RESTRICTIONS: No  FALLS:  Has patient fallen in last 6 months? No  LIVING ENVIRONMENT: Lives with: lives with their family Lives in: House/apartment Stairs: No Has following equipment at home: Environmental consultant - 4 wheeled, Wheelchair (power), and Grab bars  OCCUPATION: Disability   PLOF: Independent (power wheelchair for community ambulation); rollator for household ambulation   PATIENT GOALS: "use this arm without pain and sleep on my left side."   OBJECTIVE:   DIAGNOSTIC FINDINGS:  Lt shoulder X-ray: IMPRESSION: Unremarkable radiographs of the left shoulder.  PATIENT SURVEYS:  FOTO 46% function to 65% predicted  COGNITION: Overall cognitive status: Within functional limits for tasks assessed     SENSATION: Not tested  POSTURE: Forward head/rounded shoulders   UPPER EXTREMITY ROM:   Active ROM Right eval Left eval  Shoulder flexion  50 (supine)  Shoulder extension    Shoulder abduction  50 (supine)   Shoulder adduction    Shoulder internal rotation  60  Shoulder external rotation  30  Elbow flexion    Elbow extension    Wrist flexion    Wrist extension    Wrist ulnar deviation    Wrist radial  deviation    Wrist pronation    Wrist supination    (Blank rows = not tested) Left shoulder PROM limited in all planes secondary to pain, though has nearly full ER; empty end feel; pain with all shoulder AROM UPPER EXTREMITY MMT:  MMT Right eval Left eval  Shoulder flexion  3-  Shoulder extension    Shoulder abduction  3-  Shoulder adduction    Shoulder internal rotation 5 4-  Shoulder external rotation 5 3+  Middle trapezius    Lower trapezius    Elbow flexion    Elbow extension    Wrist flexion    Wrist extension    Wrist ulnar deviation    Wrist radial deviation    Wrist pronation    Wrist supination    Grip strength (lbs)    (Blank rows = not tested) Pain will all Lt shoulder MMT SHOULDER SPECIAL TESTS: Not tested due to high irritability   JOINT MOBILITY TESTING:  Not tested   PALPATION:  TTP supraspinatus and greater tubercle    [facility] Adult PT Treatment:                                                DATE: 03/07/22 Therapeutic Exercise: Demonstrated and issue initial HEP.   Therapeutic Activity: Education on assessment findings that will be addressed throughout duration of POC.  Self-Care: Modalities for pain control     PATIENT EDUCATION: Education details: see treatment  Person educated: Patient Education method: Explanation, Demonstration, Tactile cues, Verbal cues, and Handouts Education comprehension: verbalized understanding, returned demonstration, verbal cues required, tactile cues required, and needs further education  HOME EXERCISE PROGRAM: Access Code: [email or web address] URL: [email or web address] Date: 03/07/2022 Prepared by: [name]  Exercises - Seated Scapular Retraction  - 2 x daily - 7 x weekly - 2 sets - 10 reps - 5 sec  hold - Seated Upper Trapezius Stretch  - 2 x daily - 7 x weekly - 2 sets - 20  sec  hold - Seated Shoulder Flexion Towel Slide at Table Top  - 2 x daily - 7 x weekly - 1 sets - 10 reps - Seated Shoulder  Abduction Towel Slide at Table Top  - 2 x daily - 7 x weekly - 1 sets - 10 reps  ASSESSMENT:  CLINICAL IMPRESSION: Patient is a 61 y.o. Rt-handed female who was seen today for physical therapy evaluation and treatment for chronic Lt shoulder pain of insidious onset. Evaluation was limited due to high irritability of pain. She has significant Lt shoulder AROM limitations secondary to pain. She has limited and painful Lt shoulder PROM in all planes, though nearly full ER PROM.She is noted to have significant weakness about the Lt shoulder with increased pain with all MMT. Special tests were not performed given high irritability. Initial HEP was issued for postural strengthening and AAROM activity. She will benefit from skilled PT to address the above stated deficits in order to optimize her function and assist in overall pain reduction.    OBJECTIVE IMPAIRMENTS: decreased activity tolerance, decreased knowledge of condition, decreased ROM, decreased strength, impaired UE functional use, postural dysfunction, and pain.   ACTIVITY LIMITATIONS: carrying, lifting, sleeping, bathing, dressing, reach over head, and hygiene/grooming  PARTICIPATION LIMITATIONS: meal prep, cleaning, laundry, shopping, and community activity  PERSONAL FACTORS: Age, Fitness, Time since onset of injury/illness/exacerbation, and 3+ comorbidities: see PMH above  are also affecting patient's functional outcome.   REHAB POTENTIAL: Good  CLINICAL DECISION MAKING: Evolving/moderate complexity  EVALUATION COMPLEXITY: Moderate  GOALS: Goals reviewed with patient? Yes  SHORT TERM GOALS: Target date: 03/28/22 (Remove Blue Hyperlink)  Patient will be independent and compliant with initial HEP.   Baseline: issued at eval  Goal status: INITIAL  2.  Patient will demonstrate at least 90 degrees of Lt shoulder flexion and abduction AROM to improve ability to complete reaching activity.  Baseline: see above  Goal status:  INITIAL  3.  Patient will demonstrate at least 45 degrees of Lt shoulder ER AROM to improve ability to wash/brush her hair.  Baseline: see above  Goal status: INITIAL   LONG TERM GOALS: Target date: 04/18/22 (Remove Blue Hyperlink)  Patient will demonstrate at least 150 degrees of Lt shoulder flexion AROM to improve ability to reach into overhead cabinet.  Baseline: see above  Goal status: INITIAL  2.  Patient will demonstrate at least 4/5 Lt shoulder strength to improve ability to lift and carry objects . Baseline: see above  Goal status: INITIAL  3.  Patient will report </=5/10 Lt shoulder pain at worst to improve her tolerance to sleeping on the left side.  Baseline: "100" Goal status: INITIAL  4.  Patient will score at least 65% on FOTO to signify clinically meaningful improvement in functional abilities.   Baseline: see above  Goal status: INITIAL  PLAN: PT FREQUENCY: 2x/week  PT DURATION: 6 weeks  PLANNED INTERVENTIONS: Therapeutic exercises, Therapeutic activity, Neuromuscular re-education, Patient/Family education, Self Care, Joint mobilization, Aquatic Therapy, Dry Needling, Electrical stimulation, Cryotherapy, Moist heat, Ionotophoresis 8m/ml Dexamethasone, Manual therapy, and Re-evaluation  PLAN FOR NEXT SESSION: review and update HEP; modalities for pain control; isometrics, AAROM.    SGwendolyn Grant PT, DPT, ATC 03/07/22 1:48 PM

## 2022-03-07 ENCOUNTER — Other Ambulatory Visit: Payer: Self-pay

## 2022-03-07 ENCOUNTER — Ambulatory Visit: Payer: Medicare Other | Attending: Family Medicine

## 2022-03-07 DIAGNOSIS — R293 Abnormal posture: Secondary | ICD-10-CM | POA: Insufficient documentation

## 2022-03-07 DIAGNOSIS — G8929 Other chronic pain: Secondary | ICD-10-CM | POA: Insufficient documentation

## 2022-03-07 DIAGNOSIS — M25512 Pain in left shoulder: Secondary | ICD-10-CM | POA: Diagnosis not present

## 2022-03-07 DIAGNOSIS — M6281 Muscle weakness (generalized): Secondary | ICD-10-CM | POA: Diagnosis present

## 2022-03-08 ENCOUNTER — Encounter (HOSPITAL_COMMUNITY): Payer: Self-pay | Admitting: *Deleted

## 2022-03-08 ENCOUNTER — Other Ambulatory Visit: Payer: Self-pay

## 2022-03-08 ENCOUNTER — Emergency Department (HOSPITAL_COMMUNITY)
Admission: EM | Admit: 2022-03-08 | Discharge: 2022-03-09 | Disposition: A | Discharge status: Home / Self Care | Payer: Medicare Other | Attending: Student | Admitting: Student

## 2022-03-08 DIAGNOSIS — M79604 Pain in right leg: Secondary | ICD-10-CM | POA: Insufficient documentation

## 2022-03-08 DIAGNOSIS — Z79899 Other long term (current) drug therapy: Secondary | ICD-10-CM | POA: Diagnosis not present

## 2022-03-08 DIAGNOSIS — I10 Essential (primary) hypertension: Secondary | ICD-10-CM | POA: Insufficient documentation

## 2022-03-08 DIAGNOSIS — J449 Chronic obstructive pulmonary disease, unspecified: Secondary | ICD-10-CM | POA: Insufficient documentation

## 2022-03-08 MED ORDER — CYCLOBENZAPRINE HCL 10 MG PO TABS: 10.0000 mg | ORAL_TABLET | Freq: Two times a day (BID) | ORAL | 0 refills | Status: DC | PRN

## 2022-03-08 MED ORDER — CYCLOBENZAPRINE HCL 10 MG PO TABS
10.0000 mg | ORAL_TABLET | Freq: Once | ORAL | Status: AC
  Administered 2022-03-09: 10 mg via ORAL
  Filled 2022-03-08: qty 1

## 2022-03-08 NOTE — ED Provider Notes (Signed)
[facility]-EMERGENCY DEPT Provider Note   CSN: [id number] Arrival date & time: 03/08/22  2012     History  Chief Complaint  Patient presents with   Leg Pain    Ashley Hodge is a 61 y.o. female.  HPI   Medical history including hypertension, hyperlipidemia, COPD, presents with complaints of leg pain.  Patient states that she woke up this morning with leg cramping, initially was her left leg, felt cramping in her left calf, had 2 episodes today, she currently has no pain in that leg but now she is having cramping in her right leg again it was in her right calf, worsened with movement improved with rest, she denies any saddle paresthesias urinary or bowel incontinency's, she has no back pain, no urinary symptoms.  No recent falls, has never had this in the past, no history of PEs or DVTs currently not on hormone therapy no recent surgeries or long immobilizations, states she took some pain meds without much relief.    Home Medications Prior to Admission medications   Medication Sig Start Date End Date Taking? Authorizing Provider  cyclobenzaprine (FLEXERIL) 10 MG tablet Take 1 tablet (10 mg total) by mouth 2 (two) times daily as needed for muscle spasms. 03/08/22  Yes Carroll Sage, PA-C  acetaminophen (TYLENOL) 500 MG tablet Take 1,000 mg by mouth daily as needed (Pain).    [provider]  albuterol (VENTOLIN HFA) 108 (90 Base) MCG/ACT inhaler Inhale 3 puffs into the lungs daily as needed for wheezing or shortness of breath. Patient not taking: Reported on 02/01/2022 04/22/21   [provider]  cephALEXin (KEFLEX) 500 MG capsule Take 1 capsule (500 mg total) by mouth 4 (four) times daily. Patient not taking: Reported on 02/01/2022 12/14/21   Blue, Soijett A, PA-C  clindamycin (CLEOCIN) 300 MG capsule Take 300 mg by mouth every 8 (eight) hours. Patient not taking: Reported on 02/01/2022 12/23/21   [provider]  ezetimibe (ZETIA) 10  MG tablet Take 10 mg by mouth at bedtime. 05/11/21   [provider]  hydrochlorothiazide (HYDRODIURIL) 12.5 MG tablet Take 12.5 mg by mouth daily. 09/07/17   [provider]  HYDROcodone-acetaminophen (NORCO/VICODIN) 5-325 MG tablet Take 1 tablet by mouth every 4 (four) hours as needed. 02/27/22   Garlon Hatchet, PA-C  lidocaine (LIDODERM) 5 % Place 1 patch onto the skin daily. Remove & Discard patch within 12 hours or as directed by MD Patient not taking: Reported on 02/01/2022 12/10/21   Sponseller, Lupe Carney R, PA-C  losartan (COZAAR) 100 MG tablet Take 100 mg by mouth at bedtime.    [provider]  methocarbamol (ROBAXIN) 500 MG tablet Take 1 tablet (500 mg total) by mouth 2 (two) times daily. Patient not taking: Reported on 02/01/2022 12/20/21   Melene Plan, DO  naproxen (NAPROSYN) 375 MG tablet Take 1 tablet (375 mg total) by mouth 2 (two) times daily. Patient not taking: Reported on 02/01/2022 01/17/22   Marita Kansas, PA-C  ondansetron (ZOFRAN) 4 MG tablet Take 1 tablet (4 mg total) by mouth every 8 (eight) hours as needed for nausea or vomiting. Patient taking differently: Take 4 mg by mouth daily as needed for nausea or vomiting. 09/09/21   Pricilla Loveless, MD  ondansetron (ZOFRAN-ODT) 4 MG disintegrating tablet Take 1 tablet (4 mg total) by mouth every 8 (eight) hours as needed for nausea or vomiting. 02/01/22   Mardene Sayer, MD  oxyCODONE (ROXICODONE) 5 MG immediate release  tablet Take 1 tablet (5 mg total) by mouth every 6 (six) hours as needed for up to 10 doses for severe pain. 02/01/22   Elgie Congo, MD  pantoprazole (PROTONIX) 20 MG tablet Take 20 mg by mouth 2 (two) times daily. 05/11/21   [provider]  rosuvastatin (CRESTOR) 20 MG tablet Take 20 mg by mouth at bedtime. 05/11/21   [provider]  traZODone (DESYREL) 50 MG tablet Take 50 mg by mouth at bedtime. Patient not taking: Reported on 02/01/2022 12/08/21   [provider]  fluticasone (FLONASE) 50 MCG/ACT nasal spray Place 2 sprays into both nostrils daily. Patient not taking: Reported on 12/10/2019 11/04/19 01/31/20  Rosemarie Ax, MD  ipratropium-albuterol (DUONEB) 0.5-2.5 (3) MG/3ML SOLN Take 3 mLs by nebulization every 6 (six) hours as needed (Wheezing and SOB). Patient not taking: Reported on 12/25/2019 12/11/19 01/31/20  Raiford Noble Latif, DO      Allergies    Lisinopril    Review of Systems   Review of Systems  Constitutional:  Negative for chills and fever.  Respiratory:  Negative for shortness of breath.   Cardiovascular:  Negative for chest pain.  Gastrointestinal:  Negative for abdominal pain.  Musculoskeletal:        Right leg pain  Neurological:  Negative for headaches.    Physical Exam Updated Vital Signs BP 124/72   Pulse 82   Temp 97.8 F (36.6 C) (Oral)   Resp 17   SpO2 94%  Physical Exam Vitals and nursing note reviewed.  Constitutional:      General: She is not in acute distress.    Appearance: She is not ill-appearing.  HENT:     Head: Normocephalic and atraumatic.     Nose: No congestion.  Eyes:     Conjunctiva/sclera: Conjunctivae normal.  Cardiovascular:     Rate and Rhythm: Normal rate and regular rhythm.     Pulses: Normal pulses.  Pulmonary:     Effort: Pulmonary effort is normal.  Abdominal:     Palpations: Abdomen is soft.     Tenderness: There is no abdominal tenderness. There is no right CVA tenderness or left CVA tenderness.  Musculoskeletal:     Comments: Spine was palpated was nontender to palpation no step-off deformities noted no overlying skin changes.  Patient had point tenderness on her right calf and right upper thigh on the posterior aspect, there is no palpable cords, no obvious unilateral leg swelling, there is no changes in skin, she has 2+ dorsal pedal pulses bilaterally, 5 out of 5 strength, she is able to ambulate.  Sensation intact to light touch  Skin:    General: Skin is warm and  dry.  Neurological:     Mental Status: She is alert.  Psychiatric:        Mood and Affect: Mood normal.     ED Results / Procedures / Treatments   Labs (all labs ordered are listed, but only abnormal results are displayed) Labs Reviewed - No data to display  EKG None  Radiology No results found.  Procedures Procedures    Medications Ordered in ED Medications  cyclobenzaprine (FLEXERIL) tablet 10 mg (has no administration in time range)    ED Course/ Medical Decision Making/ A&P                           Medical Decision Making Risk Prescription drug management.   This patient presents to  the ED for concern of leg pain, this involves an extensive number of treatment options, and is a complaint that carries with it a high risk of complications and morbidity.  The differential diagnosis includes spine equina, DVT, ischemic leg    Additional history obtained:  Additional history obtained from N/A External records from outside source obtained and reviewed including PCP notes   Co morbidities that complicate the patient evaluation  DM Social Determinants of Health:  N/A    Lab Tests:  I Ordered, and personally interpreted labs.  The pertinent results include: N/A   Imaging Studies ordered:  I ordered imaging studies including N/A I independently visualized and interpreted imaging which showed N/A I agree with the radiologist interpretation   Cardiac Monitoring:  The patient was maintained on a cardiac monitor.  I personally viewed and interpreted the cardiac monitored which showed an underlying rhythm of: N/A   Medicines ordered and prescription drug management:  I ordered medication including fexril  for pain I have reviewed the patients home medicines and have made adjustments as needed  Critical Interventions:  N/a   Reevaluation:  Benign physical exam agreement with plan and discharge.  Consultations Obtained:  N/A    Test  Considered:  N/A    Rule out I have low suspicion for spinal fracture or spinal cord abnormality as patient denies urinary incontinency, retention, difficulty with bowel movements, denies saddle paresthesias.  Spine was palpated there is no step-off, crepitus or gross deformities felt, patient had 5/5 strength, full range of motion, neurovascular fully intact in the lower extremities. Low suspicion for septic arthritis as patient denies IV drug use, skin exam was performed no erythematous, edema or warm joints noted.  Suspicion for DVT is also low at this time presentation is atypical of etiology, there is no URI leg swelling no palpable cords, but will have her come back for DVT study in the morning.  I doubt ischemic leg as there is no skin changes, she has palpable pulses, sensation fully tact to light touch pain is not out of proportion.     Dispostion and problem list  After consideration of the diagnostic results and the patients response to treatment, I feel that the patent would benefit from discharge.   Leg pain-suspect this is more muscular in nature will provide her with a muscle relaxer, have her come back for outpatient DVT study.            Final Clinical Impression(s) / ED Diagnoses Final diagnoses:  Right leg pain    Rx / DC Orders ED Discharge Orders          Ordered    cyclobenzaprine (FLEXERIL) 10 MG tablet  2 times daily PRN        03/08/22 2327    [facility]        03/08/22 2327              Carroll Sage, PA-C 03/08/22 2329    Sloan Leiter, DO 03/10/22 [id number]

## 2022-03-08 NOTE — ED Notes (Signed)
Pt reports that she had a pain in her calf this morning and it went away then it came back in her thigh and she came in to be seen pt reports she is unable to walk . Pt given heat packs to help relieve pain.

## 2022-03-08 NOTE — ED Triage Notes (Signed)
Pt states right leg cramping (from the knee down) that woke her up from her sleep around 0330. She says that when she woke up around 10:30, she says she was unable to put pressure on it because when she did it caused increased pain. Now having right lower back pain that goes all the way down her leg. Denies numbness/tingling or loss of bowel or bladder. Last Hydrocodone around 1400

## 2022-03-08 NOTE — Discharge Instructions (Signed)
Likely this is a muscular strain please apply warm compresses to your leg and stretch out the muscles this will help with your pain.  I have given you a muscle relaxer can make you drowsy do not consume alcohol or operate heavy machinery while taking this medication.  I have you followed up for a DVT study please come back for a appointment.  Come back to the emergency department if you develop chest pain, shortness of breath, severe abdominal pain, uncontrolled nausea, vomiting, diarrhea.

## 2022-03-09 ENCOUNTER — Ambulatory Visit (HOSPITAL_COMMUNITY): Admission: RE | Admit: 2022-03-09 | Payer: Medicare Other | Source: Ambulatory Visit

## 2022-03-09 DIAGNOSIS — M79604 Pain in right leg: Secondary | ICD-10-CM | POA: Diagnosis not present

## 2022-03-09 NOTE — ED Notes (Signed)
Pt left Emergency with all of her belongings. Pt has access to home.

## 2022-03-10 ENCOUNTER — Telehealth: Payer: Self-pay

## 2022-03-10 ENCOUNTER — Ambulatory Visit: Payer: Medicare Other

## 2022-03-10 NOTE — Telephone Encounter (Signed)
Spoke with patient regarding missed PT appointment. She forgot about the appointment. Reminded patient of next scheduled visit and reviewed attendance policy.   [name], PT, DPT, ATC 03/10/22 3:47 PM

## 2022-03-10 NOTE — Therapy (Incomplete)
OUTPATIENT PHYSICAL THERAPY TREATMENT NOTE   Patient Name: Ashley Hodge MRN: [id number] DOB:25-Jun-1960, 61 y.o., female Today's Date: 03/10/2022  PCP: Ellyn Hack, MD REFERRING PROVIDER: Salvatore Marvel, MD  END OF SESSION:    Past Medical History:  Diagnosis Date   Anxiety    Asthma    COPD (chronic obstructive pulmonary disease) (HCC)    High cholesterol    Hypertension    Past Surgical History:  Procedure Laterality Date   TUBAL LIGATION     Patient Active Problem List   Diagnosis Date Noted   DOE (dyspnea on exertion) 04/23/2020   COPD with chronic bronchitis and emphysema (HCC) 12/11/2019   Pre-diabetes 12/11/2019   Hypokalemia 12/10/2019   Essential hypertension 12/10/2019   Hyperlipidemia 12/10/2019    REFERRING DIAG: Lt RTC tendonitis and adhesive capsulitis   THERAPY DIAG:  No diagnosis found.  Rationale for Evaluation and Treatment Rehabilitation  PERTINENT HISTORY:  Anxiety COPD Hypertension   PRECAUTIONS: none   SUBJECTIVE:                                                                                                                                                                                      SUBJECTIVE STATEMENT:  ***   PAIN:  Are you having pain? Yes: NPRS scale: ***/10 Pain location: *** Pain description: *** Aggravating factors: *** Relieving factors: ***   OBJECTIVE: (objective measures completed at initial evaluation unless otherwise dated)  DIAGNOSTIC FINDINGS:  Lt shoulder X-ray: IMPRESSION: Unremarkable radiographs of the left shoulder.   PATIENT SURVEYS:  FOTO 46% function to 65% predicted   COGNITION: Overall cognitive status: Within functional limits for tasks assessed                                     SENSATION: Not tested   POSTURE: Forward head/rounded shoulders    UPPER EXTREMITY ROM:    Active ROM Right eval Left eval  Shoulder flexion   50 (supine)  Shoulder extension      Shoulder  abduction   50 (supine)   Shoulder adduction      Shoulder internal rotation   60  Shoulder external rotation   30  Elbow flexion      Elbow extension      Wrist flexion      Wrist extension      Wrist ulnar deviation      Wrist radial deviation      Wrist pronation      Wrist supination      (Blank rows = not tested) Left shoulder  PROM limited in all planes secondary to pain, though has nearly full ER; empty end feel; pain with all shoulder AROM UPPER EXTREMITY MMT:   MMT Right eval Left eval  Shoulder flexion   3-  Shoulder extension      Shoulder abduction   3-  Shoulder adduction      Shoulder internal rotation 5 4-  Shoulder external rotation 5 3+  Middle trapezius      Lower trapezius      Elbow flexion      Elbow extension      Wrist flexion      Wrist extension      Wrist ulnar deviation      Wrist radial deviation      Wrist pronation      Wrist supination      Grip strength (lbs)      (Blank rows = not tested) Pain will all Lt shoulder MMT SHOULDER SPECIAL TESTS: Not tested due to high irritability    JOINT MOBILITY TESTING:  Not tested    PALPATION:  TTP supraspinatus and greater tubercle      [facility] Adult PT Treatment:                                                DATE: 03/10/22 Therapeutic Exercise: *** Manual Therapy: *** Neuromuscular re-ed: *** Therapeutic Activity: *** Modalities: *** Self Care: [name] Adult PT Treatment:                                                DATE: 03/07/22 Therapeutic Exercise: Demonstrated and issue initial HEP.    Therapeutic Activity: Education on assessment findings that will be addressed throughout duration of POC.  Self-Care: Modalities for pain control        PATIENT EDUCATION: Education details: see treatment  Person educated: Patient Education method: Explanation, Demonstration, Tactile cues, Verbal cues, and Handouts Education comprehension: verbalized understanding, returned  demonstration, verbal cues required, tactile cues required, and needs further education   HOME EXERCISE PROGRAM: Access Code: [email or web address] URL: [email or web address] Date: 03/07/2022 Prepared by: [name]   Exercises - Seated Scapular Retraction  - 2 x daily - 7 x weekly - 2 sets - 10 reps - 5 sec  hold - Seated Upper Trapezius Stretch  - 2 x daily - 7 x weekly - 2 sets - 20 sec  hold - Seated Shoulder Flexion Towel Slide at Table Top  - 2 x daily - 7 x weekly - 1 sets - 10 reps - Seated Shoulder Abduction Towel Slide at Table Top  - 2 x daily - 7 x weekly - 1 sets - 10 reps   ASSESSMENT:   CLINICAL IMPRESSION:      OBJECTIVE IMPAIRMENTS: decreased activity tolerance, decreased knowledge of condition, decreased ROM, decreased strength, impaired UE functional use, postural dysfunction, and pain.    ACTIVITY LIMITATIONS: carrying, lifting, sleeping, bathing, dressing, reach over head, and hygiene/grooming   PARTICIPATION LIMITATIONS: meal prep, cleaning, laundry, shopping, and community activity   PERSONAL FACTORS: Age, Fitness, Time since onset of injury/illness/exacerbation, and 3+ comorbidities: see PMH above  are also affecting patient's functional outcome.    REHAB POTENTIAL: Good   CLINICAL DECISION MAKING:  Evolving/moderate complexity   EVALUATION COMPLEXITY: Moderate   GOALS: Goals reviewed with patient? Yes   SHORT TERM GOALS: Target date: 03/28/22 (Remove Blue Hyperlink)   Patient will be independent and compliant with initial HEP.    Baseline: issued at eval  Goal status: INITIAL   2.  Patient will demonstrate at least 90 degrees of Lt shoulder flexion and abduction AROM to improve ability to complete reaching activity.  Baseline: see above  Goal status: INITIAL   3.  Patient will demonstrate at least 45 degrees of Lt shoulder ER AROM to improve ability to wash/brush her hair.  Baseline: see above  Goal status: INITIAL     LONG TERM GOALS:  Target date: 04/18/22 (Remove Blue Hyperlink)   Patient will demonstrate at least 150 degrees of Lt shoulder flexion AROM to improve ability to reach into overhead cabinet.  Baseline: see above  Goal status: INITIAL   2.  Patient will demonstrate at least 4/5 Lt shoulder strength to improve ability to lift and carry objects . Baseline: see above  Goal status: INITIAL   3.  Patient will report </=5/10 Lt shoulder pain at worst to improve her tolerance to sleeping on the left side.  Baseline: "100" Goal status: INITIAL   4.  Patient will score at least 65% on FOTO to signify clinically meaningful improvement in functional abilities.    Baseline: see above  Goal status: INITIAL   PLAN: PT FREQUENCY: 2x/week   PT DURATION: 6 weeks   PLANNED INTERVENTIONS: Therapeutic exercises, Therapeutic activity, Neuromuscular re-education, Patient/Family education, Self Care, Joint mobilization, Aquatic Therapy, Dry Needling, Electrical stimulation, Cryotherapy, Moist heat, Ionotophoresis 4mg /ml Dexamethasone, Manual therapy, and Re-evaluation   PLAN FOR NEXT SESSION: review and update HEP; modalities for pain control; isometrics, AAROM.    , PT, DPT, ATC 03/10/22 3:11 PM

## 2022-03-17 ENCOUNTER — Ambulatory Visit: Payer: Medicare Other | Attending: Family Medicine

## 2022-03-17 NOTE — Therapy (Incomplete)
OUTPATIENT PHYSICAL THERAPY TREATMENT NOTE   Patient Name: Ashley Hodge MRN: [id number] DOB:Aug 14, 1960, 61 y.o., female Today's Date: 03/17/2022  PCP: Ellyn Hack, MD REFERRING PROVIDER: Salvatore Marvel, MD  END OF SESSION:    Past Medical History:  Diagnosis Date   Anxiety    Asthma    COPD (chronic obstructive pulmonary disease) (HCC)    High cholesterol    Hypertension    Past Surgical History:  Procedure Laterality Date   TUBAL LIGATION     Patient Active Problem List   Diagnosis Date Noted   DOE (dyspnea on exertion) 04/23/2020   COPD with chronic bronchitis and emphysema (HCC) 12/11/2019   Pre-diabetes 12/11/2019   Hypokalemia 12/10/2019   Essential hypertension 12/10/2019   Hyperlipidemia 12/10/2019    REFERRING DIAG: Lt RTC tendonitis and adhesive capsulitis   THERAPY DIAG:  No diagnosis found.  Rationale for Evaluation and Treatment Rehabilitation  PERTINENT HISTORY:  Anxiety COPD Hypertension   PRECAUTIONS: none   SUBJECTIVE:                                                                                                                                                                                      SUBJECTIVE STATEMENT:  ***   PAIN:  Are you having pain? Yes: NPRS scale: ***/10 Pain location: *** Pain description: *** Aggravating factors: *** Relieving factors: ***   OBJECTIVE: (objective measures completed at initial evaluation unless otherwise dated)  DIAGNOSTIC FINDINGS:  Lt shoulder X-ray: IMPRESSION: Unremarkable radiographs of the left shoulder.   PATIENT SURVEYS:  FOTO 46% function to 65% predicted   COGNITION: Overall cognitive status: Within functional limits for tasks assessed                                     SENSATION: Not tested   POSTURE: Forward head/rounded shoulders    UPPER EXTREMITY ROM:    Active ROM Right eval Left eval  Shoulder flexion   50 (supine)  Shoulder extension      Shoulder  abduction   50 (supine)   Shoulder adduction      Shoulder internal rotation   60  Shoulder external rotation   30  Elbow flexion      Elbow extension      Wrist flexion      Wrist extension      Wrist ulnar deviation      Wrist radial deviation      Wrist pronation      Wrist supination      (Blank rows = not tested) Left shoulder  PROM limited in all planes secondary to pain, though has nearly full ER; empty end feel; pain with all shoulder AROM UPPER EXTREMITY MMT:   MMT Right eval Left eval  Shoulder flexion   3-  Shoulder extension      Shoulder abduction   3-  Shoulder adduction      Shoulder internal rotation 5 4-  Shoulder external rotation 5 3+  Middle trapezius      Lower trapezius      Elbow flexion      Elbow extension      Wrist flexion      Wrist extension      Wrist ulnar deviation      Wrist radial deviation      Wrist pronation      Wrist supination      Grip strength (lbs)      (Blank rows = not tested) Pain will all Lt shoulder MMT SHOULDER SPECIAL TESTS: Not tested due to high irritability    JOINT MOBILITY TESTING:  Not tested    PALPATION:  TTP supraspinatus and greater tubercle      [facility] Adult PT Treatment:                                                DATE: 03/10/22 Therapeutic Exercise: *** Manual Therapy: *** Neuromuscular re-ed: *** Therapeutic Activity: *** Modalities: *** Self Care: [name] Adult PT Treatment:                                                DATE: 03/07/22 Therapeutic Exercise: Demonstrated and issue initial HEP.    Therapeutic Activity: Education on assessment findings that will be addressed throughout duration of POC.  Self-Care: Modalities for pain control        PATIENT EDUCATION: Education details: see treatment  Person educated: Patient Education method: Explanation, Demonstration, Tactile cues, Verbal cues, and Handouts Education comprehension: verbalized understanding, returned  demonstration, verbal cues required, tactile cues required, and needs further education   HOME EXERCISE PROGRAM: Access Code: [email or web address] URL: [email or web address] Date: 03/07/2022 Prepared by: [name]   Exercises - Seated Scapular Retraction  - 2 x daily - 7 x weekly - 2 sets - 10 reps - 5 sec  hold - Seated Upper Trapezius Stretch  - 2 x daily - 7 x weekly - 2 sets - 20 sec  hold - Seated Shoulder Flexion Towel Slide at Table Top  - 2 x daily - 7 x weekly - 1 sets - 10 reps - Seated Shoulder Abduction Towel Slide at Table Top  - 2 x daily - 7 x weekly - 1 sets - 10 reps   ASSESSMENT:   CLINICAL IMPRESSION:      OBJECTIVE IMPAIRMENTS: decreased activity tolerance, decreased knowledge of condition, decreased ROM, decreased strength, impaired UE functional use, postural dysfunction, and pain.    ACTIVITY LIMITATIONS: carrying, lifting, sleeping, bathing, dressing, reach over head, and hygiene/grooming   PARTICIPATION LIMITATIONS: meal prep, cleaning, laundry, shopping, and community activity   PERSONAL FACTORS: Age, Fitness, Time since onset of injury/illness/exacerbation, and 3+ comorbidities: see PMH above  are also affecting patient's functional outcome.    REHAB POTENTIAL: Good   CLINICAL DECISION MAKING:  Evolving/moderate complexity   EVALUATION COMPLEXITY: Moderate   GOALS: Goals reviewed with patient? Yes   SHORT TERM GOALS: Target date: 03/28/22 (Remove Blue Hyperlink)   Patient will be independent and compliant with initial HEP.    Baseline: issued at eval  Goal status: INITIAL   2.  Patient will demonstrate at least 90 degrees of Lt shoulder flexion and abduction AROM to improve ability to complete reaching activity.  Baseline: see above  Goal status: INITIAL   3.  Patient will demonstrate at least 45 degrees of Lt shoulder ER AROM to improve ability to wash/brush her hair.  Baseline: see above  Goal status: INITIAL     LONG TERM GOALS:  Target date: 04/18/22 (Remove Blue Hyperlink)   Patient will demonstrate at least 150 degrees of Lt shoulder flexion AROM to improve ability to reach into overhead cabinet.  Baseline: see above  Goal status: INITIAL   2.  Patient will demonstrate at least 4/5 Lt shoulder strength to improve ability to lift and carry objects . Baseline: see above  Goal status: INITIAL   3.  Patient will report </=5/10 Lt shoulder pain at worst to improve her tolerance to sleeping on the left side.  Baseline: "100" Goal status: INITIAL   4.  Patient will score at least 65% on FOTO to signify clinically meaningful improvement in functional abilities.    Baseline: see above  Goal status: INITIAL   PLAN: PT FREQUENCY: 2x/week   PT DURATION: 6 weeks   PLANNED INTERVENTIONS: Therapeutic exercises, Therapeutic activity, Neuromuscular re-education, Patient/Family education, Self Care, Joint mobilization, Aquatic Therapy, Dry Needling, Electrical stimulation, Cryotherapy, Moist heat, Ionotophoresis 4mg /ml Dexamethasone, Manual therapy, and Re-evaluation   PLAN FOR NEXT SESSION: review and update HEP; modalities for pain control; isometrics, AAROM.    , PT, DPT, ATC 03/17/22 8:52 AM

## 2022-03-18 ENCOUNTER — Telehealth: Payer: Self-pay

## 2022-03-18 NOTE — Telephone Encounter (Signed)
Spoke with patient regarding second missed visit. Reviewed attendance policy informing patient that she can schedule one visit at a time moving forward. Reminded patient of next scheduled visit.   [name], PT, DPT, ATC 03/18/22 10:38 AM

## 2022-03-21 ENCOUNTER — Telehealth: Payer: Self-pay

## 2022-03-21 ENCOUNTER — Ambulatory Visit: Payer: Medicare Other

## 2022-03-21 NOTE — Telephone Encounter (Signed)
Spoke with patient regarding missed appointment. Informed patient that due to this being her third missed appointment she will be discharged from PT and require a new referral if she would like to continue with PT. Patient verbalized understanding.   [name], PT, DPT, ATC 03/21/22 3:52 PM

## 2022-03-21 NOTE — Therapy (Incomplete)
OUTPATIENT PHYSICAL THERAPY TREATMENT NOTE   Patient Name: Ashley Hodge MRN: [id number] DOB:08-12-1960, 61 y.o., female Today's Date: 03/21/2022  PCP: Ellyn Hack, MD REFERRING PROVIDER: Salvatore Marvel, MD  END OF SESSION:    Past Medical History:  Diagnosis Date   Anxiety    Asthma    COPD (chronic obstructive pulmonary disease) (HCC)    High cholesterol    Hypertension    Past Surgical History:  Procedure Laterality Date   TUBAL LIGATION     Patient Active Problem List   Diagnosis Date Noted   DOE (dyspnea on exertion) 04/23/2020   COPD with chronic bronchitis and emphysema (HCC) 12/11/2019   Pre-diabetes 12/11/2019   Hypokalemia 12/10/2019   Essential hypertension 12/10/2019   Hyperlipidemia 12/10/2019    REFERRING DIAG: Lt RTC tendonitis and adhesive capsulitis   THERAPY DIAG:  No diagnosis found.  Rationale for Evaluation and Treatment Rehabilitation  PERTINENT HISTORY:  Anxiety COPD Hypertension   PRECAUTIONS: none   SUBJECTIVE:                                                                                                                                                                                      SUBJECTIVE STATEMENT:  ***   PAIN:  Are you having pain? Yes: NPRS scale: ***/10 Pain location: *** Pain description: *** Aggravating factors: *** Relieving factors: ***   OBJECTIVE: (objective measures completed at initial evaluation unless otherwise dated)  DIAGNOSTIC FINDINGS:  Lt shoulder X-ray: IMPRESSION: Unremarkable radiographs of the left shoulder.   PATIENT SURVEYS:  FOTO 46% function to 65% predicted   COGNITION: Overall cognitive status: Within functional limits for tasks assessed                                     SENSATION: Not tested   POSTURE: Forward head/rounded shoulders    UPPER EXTREMITY ROM:    Active ROM Right eval Left eval  Shoulder flexion   50 (supine)  Shoulder extension      Shoulder  abduction   50 (supine)   Shoulder adduction      Shoulder internal rotation   60  Shoulder external rotation   30  Elbow flexion      Elbow extension      Wrist flexion      Wrist extension      Wrist ulnar deviation      Wrist radial deviation      Wrist pronation      Wrist supination      (Blank rows = not tested) Left shoulder  PROM limited in all planes secondary to pain, though has nearly full ER; empty end feel; pain with all shoulder AROM UPPER EXTREMITY MMT:   MMT Right eval Left eval  Shoulder flexion   3-  Shoulder extension      Shoulder abduction   3-  Shoulder adduction      Shoulder internal rotation 5 4-  Shoulder external rotation 5 3+  Middle trapezius      Lower trapezius      Elbow flexion      Elbow extension      Wrist flexion      Wrist extension      Wrist ulnar deviation      Wrist radial deviation      Wrist pronation      Wrist supination      Grip strength (lbs)      (Blank rows = not tested) Pain will all Lt shoulder MMT SHOULDER SPECIAL TESTS: Not tested due to high irritability    JOINT MOBILITY TESTING:  Not tested    PALPATION:  TTP supraspinatus and greater tubercle      [facility] Adult PT Treatment:                                                DATE: 03/10/22 Therapeutic Exercise: *** Manual Therapy: *** Neuromuscular re-ed: *** Therapeutic Activity: *** Modalities: *** Self Care: [name] Adult PT Treatment:                                                DATE: 03/07/22 Therapeutic Exercise: Demonstrated and issue initial HEP.    Therapeutic Activity: Education on assessment findings that will be addressed throughout duration of POC.  Self-Care: Modalities for pain control        PATIENT EDUCATION: Education details: see treatment  Person educated: Patient Education method: Explanation, Demonstration, Tactile cues, Verbal cues, and Handouts Education comprehension: verbalized understanding, returned  demonstration, verbal cues required, tactile cues required, and needs further education   HOME EXERCISE PROGRAM: Access Code: [email or web address] URL: [email or web address] Date: 03/07/2022 Prepared by: [name]   Exercises - Seated Scapular Retraction  - 2 x daily - 7 x weekly - 2 sets - 10 reps - 5 sec  hold - Seated Upper Trapezius Stretch  - 2 x daily - 7 x weekly - 2 sets - 20 sec  hold - Seated Shoulder Flexion Towel Slide at Table Top  - 2 x daily - 7 x weekly - 1 sets - 10 reps - Seated Shoulder Abduction Towel Slide at Table Top  - 2 x daily - 7 x weekly - 1 sets - 10 reps   ASSESSMENT:   CLINICAL IMPRESSION:      OBJECTIVE IMPAIRMENTS: decreased activity tolerance, decreased knowledge of condition, decreased ROM, decreased strength, impaired UE functional use, postural dysfunction, and pain.    ACTIVITY LIMITATIONS: carrying, lifting, sleeping, bathing, dressing, reach over head, and hygiene/grooming   PARTICIPATION LIMITATIONS: meal prep, cleaning, laundry, shopping, and community activity   PERSONAL FACTORS: Age, Fitness, Time since onset of injury/illness/exacerbation, and 3+ comorbidities: see PMH above  are also affecting patient's functional outcome.    REHAB POTENTIAL: Good   CLINICAL DECISION MAKING:  Evolving/moderate complexity   EVALUATION COMPLEXITY: Moderate   GOALS: Goals reviewed with patient? Yes   SHORT TERM GOALS: Target date: 03/28/22 (Remove Blue Hyperlink)   Patient will be independent and compliant with initial HEP.    Baseline: issued at eval  Goal status: INITIAL   2.  Patient will demonstrate at least 90 degrees of Lt shoulder flexion and abduction AROM to improve ability to complete reaching activity.  Baseline: see above  Goal status: INITIAL   3.  Patient will demonstrate at least 45 degrees of Lt shoulder ER AROM to improve ability to wash/brush her hair.  Baseline: see above  Goal status: INITIAL     LONG TERM GOALS:  Target date: 04/18/22 (Remove Blue Hyperlink)   Patient will demonstrate at least 150 degrees of Lt shoulder flexion AROM to improve ability to reach into overhead cabinet.  Baseline: see above  Goal status: INITIAL   2.  Patient will demonstrate at least 4/5 Lt shoulder strength to improve ability to lift and carry objects . Baseline: see above  Goal status: INITIAL   3.  Patient will report </=5/10 Lt shoulder pain at worst to improve her tolerance to sleeping on the left side.  Baseline: "100" Goal status: INITIAL   4.  Patient will score at least 65% on FOTO to signify clinically meaningful improvement in functional abilities.    Baseline: see above  Goal status: INITIAL   PLAN: PT FREQUENCY: 2x/week   PT DURATION: 6 weeks   PLANNED INTERVENTIONS: Therapeutic exercises, Therapeutic activity, Neuromuscular re-education, Patient/Family education, Self Care, Joint mobilization, Aquatic Therapy, Dry Needling, Electrical stimulation, Cryotherapy, Moist heat, Ionotophoresis 4mg /ml Dexamethasone, Manual therapy, and Re-evaluation   PLAN FOR NEXT SESSION: review and update HEP; modalities for pain control; isometrics, AAROM.    [name], PT, DPT, ATC 03/21/22 9:40 AM

## 2022-03-23 ENCOUNTER — Emergency Department (HOSPITAL_COMMUNITY): Payer: Medicare Other

## 2022-03-23 ENCOUNTER — Emergency Department (HOSPITAL_COMMUNITY)
Admission: EM | Admit: 2022-03-23 | Discharge: 2022-03-23 | Disposition: A | Discharge status: Home / Self Care | Payer: Medicare Other | Attending: Emergency Medicine | Admitting: Emergency Medicine

## 2022-03-23 ENCOUNTER — Encounter (HOSPITAL_COMMUNITY): Payer: Self-pay

## 2022-03-23 DIAGNOSIS — S6992XA Unspecified injury of left wrist, hand and finger(s), initial encounter: Secondary | ICD-10-CM | POA: Diagnosis present

## 2022-03-23 DIAGNOSIS — W230XXA Caught, crushed, jammed, or pinched between moving objects, initial encounter: Secondary | ICD-10-CM | POA: Diagnosis not present

## 2022-03-23 DIAGNOSIS — S6702XA Crushing injury of left thumb, initial encounter: Secondary | ICD-10-CM | POA: Insufficient documentation

## 2022-03-23 DIAGNOSIS — S6710XA Crushing injury of unspecified finger(s), initial encounter: Secondary | ICD-10-CM

## 2022-03-23 DIAGNOSIS — S60012A Contusion of left thumb without damage to nail, initial encounter: Secondary | ICD-10-CM | POA: Diagnosis not present

## 2022-03-23 NOTE — Discharge Instructions (Signed)
Your x-ray is reassuring, it does not appear there is anything broken.  You can wear a splint and allow for the finger to heal.  Follow-up with orthopedics if not improving, referral provided.

## 2022-03-23 NOTE — ED Provider Notes (Signed)
[facility]-EMERGENCY DEPT Provider Note   CSN: [id number] Arrival date & time: 03/23/22  1533     History  Chief Complaint  Patient presents with   Finger Injury    Ashley Hodge is a 61 y.o. female.  61 year old female with left thumb injury, occurred around a week ago.  Patient states that she was in her motorized wheelchair the other day, leaned down and accidentally moved her wheelchair forward which crushed her thumb between the foot rest and a door.  Skin is intact, has diffuse left thumb pain, not improving, worse anytime she bumps into anything which prompted her to come in today.       Home Medications Prior to Admission medications   Medication Sig Start Date End Date Taking? Authorizing Provider  acetaminophen (TYLENOL) 500 MG tablet Take 1,000 mg by mouth daily as needed (Pain).    [provider]  albuterol (VENTOLIN HFA) 108 (90 Base) MCG/ACT inhaler Inhale 3 puffs into the lungs daily as needed for wheezing or shortness of breath. Patient not taking: Reported on 02/01/2022 04/22/21   [provider]  cephALEXin (KEFLEX) 500 MG capsule Take 1 capsule (500 mg total) by mouth 4 (four) times daily. Patient not taking: Reported on 02/01/2022 12/14/21   Blue, Soijett A, PA-C  clindamycin (CLEOCIN) 300 MG capsule Take 300 mg by mouth every 8 (eight) hours. Patient not taking: Reported on 02/01/2022 12/23/21   [provider]  cyclobenzaprine (FLEXERIL) 10 MG tablet Take 1 tablet (10 mg total) by mouth 2 (two) times daily as needed for muscle spasms. 03/08/22   Carroll Sage, PA-C  ezetimibe (ZETIA) 10 MG tablet Take 10 mg by mouth at bedtime. 05/11/21   [provider]  hydrochlorothiazide (HYDRODIURIL) 12.5 MG tablet Take 12.5 mg by mouth daily. 09/07/17   [provider]  HYDROcodone-acetaminophen (NORCO/VICODIN) 5-325 MG tablet Take 1 tablet by mouth every 4 (four) hours as needed. 02/27/22   Garlon Hatchet, PA-C  lidocaine (LIDODERM) 5 % Place 1 patch onto the skin daily. Remove & Discard patch within 12 hours or as directed by MD Patient not taking: Reported on 02/01/2022 12/10/21   Sponseller, Lupe Carney R, PA-C  losartan (COZAAR) 100 MG tablet Take 100 mg by mouth at bedtime.    [provider]  methocarbamol (ROBAXIN) 500 MG tablet Take 1 tablet (500 mg total) by mouth 2 (two) times daily. Patient not taking: Reported on 02/01/2022 12/20/21   Melene Plan, DO  naproxen (NAPROSYN) 375 MG tablet Take 1 tablet (375 mg total) by mouth 2 (two) times daily. Patient not taking: Reported on 02/01/2022 01/17/22   Marita Kansas, PA-C  ondansetron (ZOFRAN) 4 MG tablet Take 1 tablet (4 mg total) by mouth every 8 (eight) hours as needed for nausea or vomiting. Patient taking differently: Take 4 mg by mouth daily as needed for nausea or vomiting. 09/09/21   Pricilla Loveless, MD  ondansetron (ZOFRAN-ODT) 4 MG disintegrating tablet Take 1 tablet (4 mg total) by mouth every 8 (eight) hours as needed for nausea or vomiting. 02/01/22   Mardene Sayer, MD  oxyCODONE (ROXICODONE) 5 MG immediate release tablet Take 1 tablet (5 mg total) by mouth every 6 (six) hours as needed for up to 10 doses for severe pain. 02/01/22   Mardene Sayer, MD  pantoprazole (PROTONIX) 20 MG tablet Take 20 mg by mouth 2 (two) times daily. 05/11/21   [provider]  rosuvastatin (CRESTOR) 20 MG tablet  Take 20 mg by mouth at bedtime. 05/11/21   [provider]  traZODone (DESYREL) 50 MG tablet Take 50 mg by mouth at bedtime. Patient not taking: Reported on 02/01/2022 12/08/21   [provider]  fluticasone (FLONASE) 50 MCG/ACT nasal spray Place 2 sprays into both nostrils daily. Patient not taking: Reported on 12/10/2019 11/04/19 01/31/20  Myra Rude, MD  ipratropium-albuterol (DUONEB) 0.5-2.5 (3) MG/3ML SOLN Take 3 mLs by nebulization every 6 (six) hours as needed (Wheezing and SOB). Patient not taking:  Reported on 12/25/2019 12/11/19 01/31/20  Marguerita Merles Latif, DO      Allergies    Lisinopril    Review of Systems   Review of Systems Negative except as per HPI Physical Exam Updated Vital Signs BP 122/62   Pulse 90   Resp 18   SpO2 96%  Physical Exam Vitals and nursing note reviewed.  Constitutional:      General: She is not in acute distress.    Appearance: She is well-developed. She is not diaphoretic.  HENT:     Head: Normocephalic and atraumatic.  Pulmonary:     Effort: Pulmonary effort is normal.  Musculoskeletal:        General: Swelling and tenderness present. No deformity.     Comments: Left thumb diffusely tender, minimally swollen, capillary refill present.  Does have subungual hematoma.  Mild discomfort along left scaphoid.  Skin is intact.  Skin:    General: Skin is warm and dry.     Findings: No erythema or rash.  Neurological:     Mental Status: She is alert and oriented to person, place, and time.  Psychiatric:        Behavior: Behavior normal.     ED Results / Procedures / Treatments   Labs (all labs ordered are listed, but only abnormal results are displayed) Labs Reviewed - No data to display  EKG None  Radiology DG Finger Thumb Left  Result Date: 03/23/2022 CLINICAL DATA:  Trauma, pain EXAM: LEFT THUMB 2+V COMPARISON:  None Available. FINDINGS: No fracture or dislocation is seen. Small bony spurs seen in first carpometacarpal joint. There are no opaque foreign bodies. IMPRESSION: No fracture or dislocation is seen in left thumb. Electronically Signed   By: Ernie Avena M.D.   On: 03/23/2022 17:05    Procedures Procedures    Medications Ordered in ED Medications - No data to display  ED Course/ Medical Decision Making/ A&P                           Medical Decision Making  61 year old female, right-hand-dominant with left thumb injury as above.  Found to have generalized tenderness of left thumb including left scaphoid.  Skin is  intact, capillary refill present, sensation intact.  Does have subungual hematoma.  X-ray is negative for acute bony injury, x-ray as ordered interpreted by myself, agree with radiologist interpretation.  Plan is to place in thumb spica splint.  Referred to orthopedics for recheck if not improving.        Final Clinical Impression(s) / ED Diagnoses Final diagnoses:  Crushing injury of finger, initial encounter  Contusion of left thumb without damage to nail, initial encounter    Rx / DC Orders ED Discharge Orders     None         Jeannie Fend, PA-C 03/23/22 1716    Benjiman Core, MD 03/24/22 [phone]

## 2022-03-23 NOTE — ED Triage Notes (Signed)
Pt presents with c/o left thumb injury that occurred approx 2 weeks ago. Pt reports she thought her wheelchair was in park and it moved forward, she jammed it into the wall.

## 2022-03-23 NOTE — ED Provider Triage Note (Signed)
Emergency Medicine Provider Triage Evaluation Note  ANNALEAH ARATA , a 61 y.o. female  was evaluated in triage.  Pt complains of left thumb pain. She reports that she hit it on a door around a week ago and has been having pain. She reports that today she started to have tingling/pins & needles sensation in her left thumb and some itching. Denies any fevers. She reports her swelling has improved.  Review of Systems  Positive:  Negative:   Physical Exam  There were no vitals taken for this visit. Gen:   Awake, no distress   Resp:  Normal effort  MSK:   Moves extremities without difficulty  Other:  Very minimal swelling noted to the left thumb. No overlying erythema or warmth. Compartments are soft. Pulses intact.   Medical Decision Making  Medically screening exam initiated at 3:48 PM.  Appropriate orders placed.  ILIZA BLANKENBECKLER was informed that the remainder of the evaluation will be completed by another provider, this initial triage assessment does not replace that evaluation, and the importance of remaining in the ED until their evaluation is complete.  XR ordered   Achille Rich, PA-C 03/23/22 1550

## 2022-03-24 ENCOUNTER — Ambulatory Visit: Payer: Medicare Other

## 2022-03-28 ENCOUNTER — Ambulatory Visit: Payer: Medicare Other

## 2022-03-29 ENCOUNTER — Ambulatory Visit
Admission: RE | Admit: 2022-03-29 | Discharge: 2022-03-29 | Disposition: A | Discharge status: Home / Self Care | Payer: Medicaid Other | Source: Ambulatory Visit | Attending: Family Medicine | Admitting: Family Medicine

## 2022-03-29 ENCOUNTER — Emergency Department (HOSPITAL_COMMUNITY)
Admission: EM | Admit: 2022-03-29 | Discharge: 2022-03-29 | Discharge status: Against Medical Advice | Payer: Medicaid Other | Source: Home / Self Care

## 2022-03-29 DIAGNOSIS — M25512 Pain in left shoulder: Secondary | ICD-10-CM

## 2022-03-31 ENCOUNTER — Ambulatory Visit: Payer: Medicare Other

## 2022-04-06 ENCOUNTER — Ambulatory Visit: Payer: Medicare Other

## 2022-04-12 ENCOUNTER — Emergency Department (HOSPITAL_COMMUNITY)
Admission: EM | Admit: 2022-04-12 | Discharge: 2022-04-13 | Discharge status: Against Medical Advice | Payer: Medicare Other | Attending: Emergency Medicine | Admitting: Emergency Medicine

## 2022-04-12 ENCOUNTER — Other Ambulatory Visit: Payer: Self-pay

## 2022-04-12 ENCOUNTER — Emergency Department (HOSPITAL_COMMUNITY): Payer: Medicare Other

## 2022-04-12 ENCOUNTER — Encounter (HOSPITAL_COMMUNITY): Payer: Self-pay

## 2022-04-12 DIAGNOSIS — R110 Nausea: Secondary | ICD-10-CM | POA: Diagnosis not present

## 2022-04-12 DIAGNOSIS — R202 Paresthesia of skin: Secondary | ICD-10-CM | POA: Insufficient documentation

## 2022-04-12 DIAGNOSIS — I10 Essential (primary) hypertension: Secondary | ICD-10-CM | POA: Diagnosis not present

## 2022-04-12 DIAGNOSIS — Z5321 Procedure and treatment not carried out due to patient leaving prior to being seen by health care provider: Secondary | ICD-10-CM | POA: Diagnosis not present

## 2022-04-12 DIAGNOSIS — R079 Chest pain, unspecified: Secondary | ICD-10-CM | POA: Insufficient documentation

## 2022-04-12 DIAGNOSIS — F17200 Nicotine dependence, unspecified, uncomplicated: Secondary | ICD-10-CM | POA: Diagnosis not present

## 2022-04-12 LAB — COMPREHENSIVE METABOLIC PANEL
ALT: 26 U/L (ref 0–44)
AST: 22 U/L (ref 15–41)
Albumin: 3.9 g/dL (ref 3.5–5.0)
Alkaline Phosphatase: 88 U/L (ref 38–126)
Anion gap: 9 (ref 5–15)
BUN: 13 mg/dL (ref 8–23)
CO2: 25 mmol/L (ref 22–32)
Calcium: 9.2 mg/dL (ref 8.9–10.3)
Chloride: 107 mmol/L (ref 98–111)
Creatinine, Ser: 0.85 mg/dL (ref 0.44–1.00)
GFR, Estimated: >60 mL/min (ref >60)
Glucose, Bld: 110 mg/dL — ABNORMAL HIGH (ref 70–99)
Potassium: 3.8 mmol/L (ref 3.5–5.1)
Sodium: 141 mmol/L (ref 135–145)
Total Bilirubin: 0.3 mg/dL (ref 0.3–1.2)
Total Protein: 6.8 g/dL (ref 6.5–8.1)

## 2022-04-12 LAB — CBC
HCT: 42.9 % (ref 36.0–46.0)
Hemoglobin: 14.1 g/dL (ref 12.0–15.0)
MCH: 30.8 pg (ref 26.0–34.0)
MCHC: 32.9 g/dL (ref 30.0–36.0)
MCV: 93.7 fL (ref 80.0–100.0)
Platelets: 314 10*3/uL (ref 150–400)
RBC: 4.58 MIL/uL (ref 3.87–5.11)
RDW: 12.2 % (ref 11.5–15.5)
WBC: 13.7 10*3/uL — ABNORMAL HIGH (ref 4.0–10.5)
nRBC: 0 % (ref 0.0–0.2)

## 2022-04-12 LAB — TROPONIN I (HIGH SENSITIVITY)
Troponin I (High Sensitivity): 4 ng/L (ref <18)
Troponin I (High Sensitivity): 3 ng/L (ref <18)

## 2022-04-12 LAB — LIPASE, BLOOD: Lipase: 35 U/L (ref 11–51)

## 2022-04-12 LAB — CBG MONITORING, ED: Glucose-Capillary: 104 mg/dL — ABNORMAL HIGH (ref 70–99)

## 2022-04-12 MED ORDER — ASPIRIN 81 MG PO CHEW
324.0000 mg | CHEWABLE_TABLET | Freq: Once | ORAL | Status: AC
  Administered 2022-04-12: 324 mg via ORAL
  Filled 2022-04-12: qty 4

## 2022-04-12 MED ORDER — HYDROCODONE-ACETAMINOPHEN 5-325 MG PO TABS
1.0000 | ORAL_TABLET | Freq: Once | ORAL | Status: AC
  Administered 2022-04-12: 1 via ORAL
  Filled 2022-04-12: qty 1

## 2022-04-12 NOTE — ED Notes (Signed)
Patient states she left to get food. Informed patient that she has to let staff know if she steps out and she can't leave unless she wants to start the visit all over. Patient states she will be back in less than 10 minutes

## 2022-04-12 NOTE — ED Provider Triage Note (Signed)
Emergency Medicine Provider Triage Evaluation Note  Ashley Hodge , a 61 y.o. female  was evaluated in triage.  Pt complains of cp. + paresthesia left arm last night.  + sudden onset nausea and chest pain starting at 2:00 pm+ smoker.  Review of Systems  Positive: cp Negative: fever  Physical Exam  BP 116/64   Pulse 99   Temp 98.4 F (36.9 C)   Resp 18   SpO2 96%  Gen:   Awake, no distress   Resp:  Normal effort  MSK:   Moves extremities without difficulty  Other:    Medical Decision Making  Medically screening exam initiated at 6:53 PM.  Appropriate orders placed.  Ashley Hodge was informed that the remainder of the evaluation will be completed by another provider, this initial triage assessment does not replace that evaluation, and the importance of remaining in the ED until their evaluation is complete.     Arthor Captain, PA-C 04/12/22 1856

## 2022-04-12 NOTE — ED Notes (Signed)
Patient called with no response.

## 2022-04-12 NOTE — ED Triage Notes (Signed)
Pt c/o L arm pain and CP started this afternoon; endorses nausea, took zofran, states cp worsened during day; denies sob; denies weakness; endorses hx HTN and HLD

## 2022-04-22 ENCOUNTER — Emergency Department (HOSPITAL_COMMUNITY)
Admission: EM | Admit: 2022-04-22 | Discharge: 2022-04-23 | Disposition: A | Discharge status: Home / Self Care | Payer: Medicare Other | Attending: Emergency Medicine | Admitting: Emergency Medicine

## 2022-04-22 ENCOUNTER — Encounter (HOSPITAL_COMMUNITY): Payer: Self-pay

## 2022-04-22 ENCOUNTER — Other Ambulatory Visit: Payer: Self-pay

## 2022-04-22 DIAGNOSIS — Z79899 Other long term (current) drug therapy: Secondary | ICD-10-CM | POA: Diagnosis not present

## 2022-04-22 DIAGNOSIS — M792 Neuralgia and neuritis, unspecified: Secondary | ICD-10-CM | POA: Insufficient documentation

## 2022-04-22 DIAGNOSIS — I671 Cerebral aneurysm, nonruptured: Secondary | ICD-10-CM | POA: Insufficient documentation

## 2022-04-22 DIAGNOSIS — M79671 Pain in right foot: Secondary | ICD-10-CM | POA: Diagnosis present

## 2022-04-22 DIAGNOSIS — I10 Essential (primary) hypertension: Secondary | ICD-10-CM | POA: Insufficient documentation

## 2022-04-22 DIAGNOSIS — R7309 Other abnormal glucose: Secondary | ICD-10-CM | POA: Insufficient documentation

## 2022-04-22 NOTE — ED Provider Triage Note (Signed)
Emergency Medicine Provider Triage Evaluation Note  Ashley Hodge , a 61 y.o. female  was evaluated in triage.  Pt complains of "pins and needles" feeling in her RUE and RLE which began at 1100 today. She laid down for a nap at 1300 and symptoms were gone upon waking; however, they began to recur tonight. She denies known modifying factors. No fever, lightheadedness, syncope, extremity weakness, vomiting, CP, SOB, neck pain. She is a smoker. Denies hx of TIA and CVA.  Review of Systems  Positive: As above Negative: As above  Physical Exam  BP (!) 145/79 (BP Location: Right Arm)   Pulse 94   Temp 98.2 F (36.8 C) (Oral)   Resp 18   Ht 5' (1.524 m)   Wt 72.6 kg   SpO2 98%   BMI 31.25 kg/m  Gen:   Awake, no distress   Resp:  Normal effort  MSK:   Moves extremities without difficulty  Other:  GCS 15. Speech is goal oriented. No deficits appreciated to CN III-XII; symmetric eyebrow raise, no facial drooping, tongue midline. Patient has equal grip strength bilaterally with 5/5 strength against resistance in all major muscle groups bilaterally. Sensation to light touch intact. Patient moves extremities without ataxia. Gait not assessed.   Medical Decision Making  Medically screening exam initiated at 11:20 PM.  Appropriate orders placed.  Ashley Hodge was informed that the remainder of the evaluation will be completed by another provider, this initial triage assessment does not replace that evaluation, and the importance of remaining in the ED until their evaluation is complete.  Paresthesias - labs and imaging initiated.   Antony Madura, PA-C 04/22/22 2323

## 2022-04-22 NOTE — ED Triage Notes (Signed)
Pt reports with a pins and needles sensation down her right side along with right foot pain since 11 am.

## 2022-04-23 ENCOUNTER — Emergency Department (HOSPITAL_COMMUNITY): Payer: Medicare Other

## 2022-04-23 ENCOUNTER — Encounter (HOSPITAL_COMMUNITY): Payer: Self-pay

## 2022-04-23 DIAGNOSIS — M792 Neuralgia and neuritis, unspecified: Secondary | ICD-10-CM | POA: Diagnosis not present

## 2022-04-23 LAB — CBC WITH DIFFERENTIAL/PLATELET
Abs Immature Granulocytes: 0.03 10*3/uL (ref 0.00–0.07)
Basophils Absolute: 0.1 10*3/uL (ref 0.0–0.1)
Basophils Relative: 0 %
Eosinophils Absolute: 0.3 10*3/uL (ref 0.0–0.5)
Eosinophils Relative: 2 %
HCT: 43 % (ref 36.0–46.0)
Hemoglobin: 13.8 g/dL (ref 12.0–15.0)
Immature Granulocytes: 0 %
Lymphocytes Relative: 26 %
Lymphs Abs: 3.2 10*3/uL (ref 0.7–4.0)
MCH: 31.2 pg (ref 26.0–34.0)
MCHC: 32.1 g/dL (ref 30.0–36.0)
MCV: 97.1 fL (ref 80.0–100.0)
Monocytes Absolute: 0.6 10*3/uL (ref 0.1–1.0)
Monocytes Relative: 5 %
Neutro Abs: 8 10*3/uL — ABNORMAL HIGH (ref 1.7–7.7)
Neutrophils Relative %: 67 %
Platelets: 280 10*3/uL (ref 150–400)
RBC: 4.43 MIL/uL (ref 3.87–5.11)
RDW: 12.7 % (ref 11.5–15.5)
WBC: 12.2 10*3/uL — ABNORMAL HIGH (ref 4.0–10.5)
nRBC: 0 % (ref 0.0–0.2)

## 2022-04-23 LAB — BASIC METABOLIC PANEL
Anion gap: 8 (ref 5–15)
BUN: 9 mg/dL (ref 8–23)
CO2: 25 mmol/L (ref 22–32)
Calcium: 8.9 mg/dL (ref 8.9–10.3)
Chloride: 109 mmol/L (ref 98–111)
Creatinine, Ser: 0.86 mg/dL (ref 0.44–1.00)
GFR, Estimated: >60 mL/min (ref >60)
Glucose, Bld: 239 mg/dL — ABNORMAL HIGH (ref 70–99)
Potassium: 3.5 mmol/L (ref 3.5–5.1)
Sodium: 142 mmol/L (ref 135–145)

## 2022-04-23 LAB — PROTIME-INR
INR: 0.9 (ref 0.8–1.2)
Prothrombin Time: 11.6 seconds (ref 11.4–15.2)

## 2022-04-23 MED ORDER — IOHEXOL 350 MG/ML SOLN
75.0000 mL | Freq: Once | INTRAVENOUS | Status: AC | PRN
  Administered 2022-04-23: 75 mL via INTRAVENOUS

## 2022-04-23 NOTE — Progress Notes (Signed)
Patient needs a 18 gauge or 20 gauge IV for CTA scan.

## 2022-04-23 NOTE — Discharge Instructions (Signed)
Take Motrin if the numbness comes back.  Follow-up with your family doctor for your elevated sugar.  And also follow-up with the neurosurgeon that we have referred you to in the next 4 to 6 weeks

## 2022-04-23 NOTE — ED Provider Notes (Signed)
[facility]-EMERGENCY DEPT Provider Note   CSN: [id number] Arrival date & time: 04/22/22  2221     History {Add pertinent medical, surgical, social history, OB history to HPI:1} Chief Complaint  Patient presents with   pins and needles   Foot Pain    Ashley Hodge is a [age] y.o. female.  Patient states that she had some tingling in her right arm and her right leg.  It has resolved now.  She has a history of hypertension   Foot Pain       Home Medications Prior to Admission medications   Medication Sig Start Date End Date Taking? Authorizing Provider  acetaminophen (TYLENOL) 500 MG tablet Take 1,000 mg by mouth daily as needed (Pain).    [provider]  albuterol (VENTOLIN HFA) 108 (90 Base) MCG/ACT inhaler Inhale 3 puffs into the lungs daily as needed for wheezing or shortness of breath. Patient not taking: Reported on 02/01/2022 04/22/21   [provider]  cephALEXin (KEFLEX) 500 MG capsule Take 1 capsule (500 mg total) by mouth 4 (four) times daily. Patient not taking: Reported on 02/01/2022 12/14/21   Blue, Soijett A, PA-C  clindamycin (CLEOCIN) 300 MG capsule Take 300 mg by mouth every 8 (eight) hours. Patient not taking: Reported on 02/01/2022 12/23/21   [provider]  cyclobenzaprine (FLEXERIL) 10 MG tablet Take 1 tablet (10 mg total) by mouth 2 (two) times daily as needed for muscle spasms. 03/08/22   Carroll SageFaulkner, William J, PA-C  ezetimibe (ZETIA) 10 MG tablet Take 10 mg by mouth at bedtime. 05/11/21   [provider]  hydrochlorothiazide (HYDRODIURIL) 12.5 MG tablet Take 12.5 mg by mouth daily. 09/07/17   [provider]  HYDROcodone-acetaminophen (NORCO/VICODIN) 5-325 MG tablet Take 1 tablet by mouth every 4 (four) hours as needed. 02/27/22   Garlon HatchetSanders, Lisa M, PA-C  lidocaine (LIDODERM) 5 % Place 1 patch onto the skin daily. Remove & Discard patch within 12 hours or as directed by MD Patient not taking:  Reported on 02/01/2022 12/10/21   Sponseller, Lupe Carneyebekah R, PA-C  losartan (COZAAR) 100 MG tablet Take 100 mg by mouth at bedtime.    [provider]  methocarbamol (ROBAXIN) 500 MG tablet Take 1 tablet (500 mg total) by mouth 2 (two) times daily. Patient not taking: Reported on 02/01/2022 12/20/21   Melene PlanFloyd, Dan, DO  naproxen (NAPROSYN) 375 MG tablet Take 1 tablet (375 mg total) by mouth 2 (two) times daily. Patient not taking: Reported on 02/01/2022 01/17/22   Marita KansasAli, Amjad, PA-C  ondansetron (ZOFRAN) 4 MG tablet Take 1 tablet (4 mg total) by mouth every 8 (eight) hours as needed for nausea or vomiting. Patient taking differently: Take 4 mg by mouth daily as needed for nausea or vomiting. 09/09/21   Pricilla LovelessGoldston, Scott, MD  ondansetron (ZOFRAN-ODT) 4 MG disintegrating tablet Take 1 tablet (4 mg total) by mouth every 8 (eight) hours as needed for nausea or vomiting. 02/01/22   Mardene SayerBranham, Victoria C, MD  oxyCODONE (ROXICODONE) 5 MG immediate release tablet Take 1 tablet (5 mg total) by mouth every 6 (six) hours as needed for up to 10 doses for severe pain. 02/01/22   Mardene SayerBranham, Victoria C, MD  pantoprazole (PROTONIX) 20 MG tablet Take 20 mg by mouth 2 (two) times daily. 05/11/21   [provider]  rosuvastatin (CRESTOR) 20 MG tablet Take 20 mg by mouth at bedtime. 05/11/21   [provider]  traZODone (DESYREL) 50 MG tablet Take 50 mg  by mouth at bedtime. Patient not taking: Reported on 02/01/2022 12/08/21   [provider]  fluticasone (FLONASE) 50 MCG/ACT nasal spray Place 2 sprays into both nostrils daily. Patient not taking: Reported on 12/10/2019 11/04/19 01/31/20  Myra Rude, MD  ipratropium-albuterol (DUONEB) 0.5-2.5 (3) MG/3ML SOLN Take 3 mLs by nebulization every 6 (six) hours as needed (Wheezing and SOB). Patient not taking: Reported on 12/25/2019 12/11/19 01/31/20  Marguerita Merles Latif, DO      Allergies    Lisinopril    Review of Systems   Review of Systems  Physical  Exam Updated Vital Signs BP 117/69   Pulse 80   Temp 98 F (36.7 C) (Oral)   Resp 16   Ht 5' (1.524 m)   Wt 72.6 kg   SpO2 94%   BMI 31.25 kg/m  Physical Exam  ED Results / Procedures / Treatments   Labs (all labs ordered are listed, but only abnormal results are displayed) Labs Reviewed  CBC WITH DIFFERENTIAL/PLATELET - Abnormal; Notable for the following components:      Result Value   WBC 12.2 (*)    Neutro Abs 8.0 (*)    All other components within normal limits  BASIC METABOLIC PANEL - Abnormal; Notable for the following components:   Glucose, Bld 239 (*)    All other components within normal limits  PROTIME-INR    EKG EKG Interpretation  Date/Time:  Friday April 22 2022 23:41:03 EST Ventricular Rate:  81 PR Interval:  129 QRS Duration: 83 QT Interval:  364 QTC Calculation: 423 R Axis:   41 Text Interpretation: Sinus rhythm Low voltage, precordial leads Confirmed by Nicanor Alcon, April ([id number]) on 04/23/2022 1:04:33 AM  Radiology CT ANGIO HEAD NECK W WO CM  Result Date: 04/23/2022 CLINICAL DATA:  61 year old female with right side/right extremity paresthesias since 1100 hours yesterday. EXAM: CT ANGIOGRAPHY HEAD AND NECK TECHNIQUE: Multidetector CT imaging of the head and neck was performed using the standard protocol during bolus administration of intravenous contrast. Multiplanar CT image reconstructions and MIPs were obtained to evaluate the vascular anatomy. Carotid stenosis measurements (when applicable) are obtained utilizing NASCET criteria, using the distal internal carotid diameter as the denominator. RADIATION DOSE REDUCTION: This exam was performed according to the departmental dose-optimization program which includes automated exposure control, adjustment of the mA and/or kV according to patient size and/or use of iterative reconstruction technique. CONTRAST:  52mL OMNIPAQUE IOHEXOL 350 MG/ML SOLN COMPARISON:  Head CT 08/12/2019. FINDINGS: CT HEAD Brain:  Cerebral volume remains normal for age. No midline shift, ventriculomegaly, mass effect, evidence of mass lesion, intracranial hemorrhage or evidence of cortically based acute infarction. Gray-white matter differentiation is within normal limits throughout the brain. Calvarium and skull base: Negative. Paranasal sinuses: Visualized paranasal sinuses and mastoids are stable and well aerated. Orbits: Visualized orbits and scalp soft tissues are within normal limits. CTA NECK Skeleton: Absent maxillary dentition, carious mandible dentition. Age-appropriate cervical spine degeneration. No acute osseous abnormality identified. Upper chest: Centrilobular emphysema. Negative visible superior mediastinum, visible central pulmonary arteries appear patent. Other neck: Negative. Aortic arch: Mild Calcified aortic atherosclerosis. 3 vessel arch configuration. Right carotid system: Mild brachiocephalic artery plaque. Negative right CCA. Mild calcified plaque at the right carotid bifurcation but more affecting the ECA. Tortuous right ICA distal to the bulb without stenosis. Left carotid system: Similar tortuosity with mild calcified plaque at the left ICA origin and bulb. No stenosis. Vertebral arteries: Minimal plaque at the right subclavian artery origin. Normal right vertebral  artery origin. Tortuous right vertebral artery throughout the neck to the skull base but no associated plaque or stenosis. There is a small right V3 segment pseudoaneurysm on series 14, image 26, 3 to 4 mm. No atherosclerosis or dissection here. No significant stenosis. Proximal left subclavian artery and left vertebral artery origin are normal. Codominant and tortuous left vertebral artery is patent to the skull base without stenosis. CTA HEAD Posterior circulation: Codominant V4 segments. Soft and calcified plaque on the left with only mild stenosis proximal to the left PICA origin which remains patent. Normal right PICA origin. Normal vertebrobasilar  junction. Patent basilar artery without stenosis. Normal SCA and PCA origins. Left posterior communicating artery is present, the right is diminutive or absent. Bilateral PCA branches are within normal limits. Anterior circulation: Both ICA siphons are patent. On the left there is mild to moderate calcified plaque in the cavernous and supraclinoid segments but no stenosis. Normal left posterior communicating artery origin. On the right there is similar mild to moderate cavernous segment plaque without stenosis. Patent carotid termini, MCA and ACA origins. Left ACA A1 is dominant. Anterior communicating artery is patent, and there is a small cephalad directed anterior communicating artery aneurysm measuring 2-3 mm seen on series 11, image 105 and series 14, image 17. Otherwise ACA branches are within normal limits. Patent MCA origins. Right MCA M1 and bifurcation are patent without stenosis. Right MCA branches are within normal limits. Saccular and slightly irregular 3-4 mm aneurysm arises posteriorly from the left MCA origin. See series 11, image 105, series 15, image 120 and series 14, image 17. No associated origin stenosis. Left M1 then bifurcates early without stenosis. Other left MCA branches are within normal limits. Venous sinuses: Patent. Anatomic variants: Dominant left ACA A1. Review of the MIP images confirms the above findings IMPRESSION: Positive 1. Positive for two unruptured intracranial Aneurysms: - Left MCA origin Aneurysm, directed posteriorly from the ICA terminus, 4 mm and mildly irregular. 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 2. 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 3. No acute intracranial abnormality. Stable and normal for age CT appearance of the brain parenchyma. 4. Negative for large vessel occlusion. Mild for age atherosclerosis in the head and neck. No significant arterial stenosis. 5.  Emphysema (ICD10-J43.9).  Electronically Signed   By: Odessa Fleming M.D.   On: 04/23/2022 06:58    Procedures Procedures  {Document cardiac monitor, telemetry assessment procedure when appropriate:1}  Medications Ordered in ED Medications  iohexol (OMNIPAQUE) 350 MG/ML injection 75 mL (75 mLs Intravenous Contrast Given 04/23/22 0604)    ED Course/ Medical Decision Making/ A&P  Patient's symptoms have improved on their own.  She does have small aneurysms on her CT and I spoke with neurosurgery and they stated the patient should follow-up in 4 to 6 weeks in the office.                           Medical Decision Making Amount and/or Complexity of Data Reviewed Radiology: ordered.   Patient with neuritis that has resolved.  She will follow-up with her family doctor for the numbness and elevated sugar.  She will take Motrin as needed and follow-up with neurosurgery for the aneurysms  {Document critical care time when appropriate:1} {Document review of labs and clinical decision tools ie heart score, Chads2Vasc2 etc:1}  {Document your independent review of radiology images, and any outside records:1} {  Document your discussion with family members, caretakers, and with consultants:1} {Document social determinants of health affecting pt's care:1} {Document your decision making why or why not admission, treatments were needed:1} Final Clinical Impression(s) / ED Diagnoses Final diagnoses:  Neuritis  Cerebral aneurysm    Rx / DC Orders ED Discharge Orders     None

## 2022-04-30 ENCOUNTER — Other Ambulatory Visit: Payer: Self-pay

## 2022-04-30 ENCOUNTER — Emergency Department (HOSPITAL_COMMUNITY)
Admission: EM | Admit: 2022-04-30 | Discharge: 2022-04-30 | Disposition: A | Discharge status: Home / Self Care | Payer: Medicare Other | Attending: Emergency Medicine | Admitting: Emergency Medicine

## 2022-04-30 DIAGNOSIS — I10 Essential (primary) hypertension: Secondary | ICD-10-CM | POA: Insufficient documentation

## 2022-04-30 DIAGNOSIS — J45909 Unspecified asthma, uncomplicated: Secondary | ICD-10-CM | POA: Insufficient documentation

## 2022-04-30 DIAGNOSIS — R519 Headache, unspecified: Secondary | ICD-10-CM | POA: Insufficient documentation

## 2022-04-30 DIAGNOSIS — J449 Chronic obstructive pulmonary disease, unspecified: Secondary | ICD-10-CM | POA: Insufficient documentation

## 2022-04-30 DIAGNOSIS — Z79899 Other long term (current) drug therapy: Secondary | ICD-10-CM | POA: Insufficient documentation

## 2022-04-30 DIAGNOSIS — M542 Cervicalgia: Secondary | ICD-10-CM

## 2022-04-30 MED ORDER — CYCLOBENZAPRINE HCL 10 MG PO TABS: 10.0000 mg | ORAL_TABLET | Freq: Two times a day (BID) | ORAL | 0 refills | Status: DC | PRN

## 2022-04-30 NOTE — ED Provider Notes (Signed)
[facility]-EMERGENCY DEPT Provider Note   CSN: [id number] Arrival date & time: 04/30/22  0249     History  Chief Complaint  Patient presents with   Headache    Ashley Hodge is a 61 y.o. female.  HPI   Patient with medical history including hypertension, asthma, COPD, anxiety presents with complaints of right-sided neck pain, started yesterday, came on around 2 PM, she feels it mainly on the right side of her neck, does not radiate, states pain is intermittent, mostly feels pain when it is palpated, she has no associated headaches no change in vision no paresthesias or weakness of the lower extremities.  Patient states that she was walking her dog and he was pulling at her she thinks she might of strained it.  She is also concerned that it could be from her cerebral aneurysm which she was diagnosed with a few weeks ago.  She is also evaluated.  Patient states she currently has no neck pain at this time.  Reviewed patient's chart was seen on the ninth for right-sided weakness, CTA head and neck was performed and shows that she has a left MCA aneurysm which is 4 mm as well as having an anterior communicating aneurysm which is 2 to 3 mm.  Supposed to follow-up with neurology for further evaluation.  Home Medications Prior to Admission medications   Medication Sig Start Date End Date Taking? Authorizing Provider  cyclobenzaprine (FLEXERIL) 10 MG tablet Take 1 tablet (10 mg total) by mouth 2 (two) times daily as needed for muscle spasms. 04/30/22  Yes Carroll Sage, PA-C  acetaminophen (TYLENOL) 500 MG tablet Take 1,000 mg by mouth daily as needed (Pain).    [provider]  albuterol (VENTOLIN HFA) 108 (90 Base) MCG/ACT inhaler Inhale 3 puffs into the lungs daily as needed for wheezing or shortness of breath. Patient not taking: Reported on 02/01/2022 04/22/21   [provider]  cephALEXin (KEFLEX) 500 MG capsule Take 1 capsule (500 mg total) by  mouth 4 (four) times daily. Patient not taking: Reported on 02/01/2022 12/14/21   Blue, Soijett A, PA-C  clindamycin (CLEOCIN) 300 MG capsule Take 300 mg by mouth every 8 (eight) hours. Patient not taking: Reported on 02/01/2022 12/23/21   [provider]  cyclobenzaprine (FLEXERIL) 10 MG tablet Take 1 tablet (10 mg total) by mouth 2 (two) times daily as needed for muscle spasms. 03/08/22   Carroll Sage, PA-C  ezetimibe (ZETIA) 10 MG tablet Take 10 mg by mouth at bedtime. 05/11/21   [provider]  hydrochlorothiazide (HYDRODIURIL) 12.5 MG tablet Take 12.5 mg by mouth daily. 09/07/17   [provider]  HYDROcodone-acetaminophen (NORCO/VICODIN) 5-325 MG tablet Take 1 tablet by mouth every 4 (four) hours as needed. 02/27/22   Garlon Hatchet, PA-C  lidocaine (LIDODERM) 5 % Place 1 patch onto the skin daily. Remove & Discard patch within 12 hours or as directed by MD Patient not taking: Reported on 02/01/2022 12/10/21   Sponseller, Lupe Carney R, PA-C  losartan (COZAAR) 100 MG tablet Take 100 mg by mouth at bedtime.    [provider]  methocarbamol (ROBAXIN) 500 MG tablet Take 1 tablet (500 mg total) by mouth 2 (two) times daily. Patient not taking: Reported on 02/01/2022 12/20/21   Melene Plan, DO  naproxen (NAPROSYN) 375 MG tablet Take 1 tablet (375 mg total) by mouth 2 (two) times daily. Patient not taking: Reported on 02/01/2022 01/17/22   Marita Kansas, PA-C  ondansetron (ZOFRAN) 4 MG tablet Take 1 tablet (4 mg total) by mouth every 8 (eight) hours as needed for nausea or vomiting. Patient taking differently: Take 4 mg by mouth daily as needed for nausea or vomiting. 09/09/21   Pricilla Loveless, MD  ondansetron (ZOFRAN-ODT) 4 MG disintegrating tablet Take 1 tablet (4 mg total) by mouth every 8 (eight) hours as needed for nausea or vomiting. 02/01/22   Mardene Sayer, MD  oxyCODONE (ROXICODONE) 5 MG immediate release tablet Take 1 tablet (5 mg total) by mouth every 6 (six)  hours as needed for up to 10 doses for severe pain. 02/01/22   Mardene Sayer, MD  pantoprazole (PROTONIX) 20 MG tablet Take 20 mg by mouth 2 (two) times daily. 05/11/21   [provider]  rosuvastatin (CRESTOR) 20 MG tablet Take 20 mg by mouth at bedtime. 05/11/21   [provider]  traZODone (DESYREL) 50 MG tablet Take 50 mg by mouth at bedtime. Patient not taking: Reported on 02/01/2022 12/08/21   [provider]  fluticasone (FLONASE) 50 MCG/ACT nasal spray Place 2 sprays into both nostrils daily. Patient not taking: Reported on 12/10/2019 11/04/19 01/31/20  Myra Rude, MD  ipratropium-albuterol (DUONEB) 0.5-2.5 (3) MG/3ML SOLN Take 3 mLs by nebulization every 6 (six) hours as needed (Wheezing and SOB). Patient not taking: Reported on 12/25/2019 12/11/19 01/31/20  Marguerita Merles Latif, DO      Allergies    Lisinopril    Review of Systems   Review of Systems  Constitutional:  Negative for chills and fever.  Respiratory:  Negative for shortness of breath.   Cardiovascular:  Negative for chest pain.  Gastrointestinal:  Negative for abdominal pain.  Neurological:  Negative for headaches.    Physical Exam Updated Vital Signs BP 125/88 (BP Location: Left Arm)   Pulse 99   Temp 98.4 F (36.9 C) (Oral)   Resp 16   SpO2 93%  Physical Exam Vitals and nursing note reviewed.  Constitutional:      General: She is not in acute distress.    Appearance: She is not ill-appearing.  HENT:     Head: Normocephalic and atraumatic.     Nose: No congestion.  Eyes:     Extraocular Movements: Extraocular movements intact.     Conjunctiva/sclera: Conjunctivae normal.     Pupils: Pupils are equal, round, and reactive to light.  Neck:     Comments: Neck was visualized there is no deformities present, there is no overlying skin changes, no bulging mass, she was slightly tender to palpation noted on the lateral aspect of her right neck it was focalized and easily  reproducible, there is no notable torticollis, cervical spine was palpated was nontender to palpation. Cardiovascular:     Rate and Rhythm: Normal rate and regular rhythm.     Pulses: Normal pulses.  Pulmonary:     Effort: Pulmonary effort is normal.  Skin:    General: Skin is warm and dry.  Neurological:     Mental Status: She is alert.     GCS: GCS eye subscore is 4. GCS verbal subscore is 5. GCS motor subscore is 6.     Cranial Nerves: Cranial nerves 2-12 are intact.     Sensory: Sensation is intact.     Motor: Motor function is intact. No weakness.     Coordination: Romberg sign negative. Finger-Nose-Finger Test normal.     Gait: Gait is intact.     Comments: Cranial nerves II through  XII grossly intact no difficulty with word finding fine two-step commands there is no unilateral weakness present.  Psychiatric:        Mood and Affect: Mood normal.     ED Results / Procedures / Treatments   Labs (all labs ordered are listed, but only abnormal results are displayed) Labs Reviewed - No data to display  EKG None  Radiology No results found.  Procedures Procedures    Medications Ordered in ED Medications - No data to display  ED Course/ Medical Decision Making/ A&P                           Medical Decision Making  This patient presents to the ED for concern of neck pain, this involves an extensive number of treatment options, and is a complaint that carries with it a high risk of complications and morbidity.  The differential diagnosis includes muscular strain, meningitis, aneurysm, dissection    Additional history obtained:  Additional history obtained from N/A External records from outside source obtained and reviewed including previous ER notes   Co morbidities that complicate the patient evaluation  Anxiety, cerebral aneurysm  Social Determinants of Health:  N/a    Lab Tests:  I Ordered, and personally interpreted labs.  The pertinent results  include:  n/a   Imaging Studies ordered:  I ordered imaging studies including n/a  I independently visualized and interpreted imaging which showed n/a I agree with the radiologist interpretation   Cardiac Monitoring:  The patient was maintained on a cardiac monitor.  I personally viewed and interpreted the cardiac monitored which showed an underlying rhythm of: n/a   Medicines ordered and prescription drug management:  I ordered medication including n/a I have reviewed the patients home medicines and have made adjustments as needed  Critical Interventions:  N/a   Reevaluation:  Patient had benign physical exam agreement discharge at this time.  Consultations Obtained:  N/a    Test Considered:  CTA head and neck-deferred as my suspicion for ruptured aneurysm/sentinel bleed is very slow at this time as she at no time had any headaches, she currently has no neck pain, pain is only elicited when palpated and is very focalized and easily reproducible, pain is on the right side of her neck whereas the aneurysms were located on the left side.    Rule out Low suspicion for CVA she has no focal deficit present my exam.   Low suspicion for meningitis as she has no meningeal sign present.     Dispostion and problem list  After consideration of the diagnostic results and the patients response to treatment, I feel that the patent would benefit from discharge.  Neck pain-likely muscular in nature will provide with muscle laxer follow-up PCP for further evaluation and strict precautions.            Final Clinical Impression(s) / ED Diagnoses Final diagnoses:  Neck pain    Rx / DC Orders ED Discharge Orders          Ordered    cyclobenzaprine (FLEXERIL) 10 MG tablet  2 times daily PRN        04/30/22 0340              Carroll Sage, PA-C 04/30/22 0342    Melene Plan, DO 04/30/22 [phone]

## 2022-04-30 NOTE — ED Triage Notes (Signed)
Patient BIB EMS for evaluation of "throbbing" in the back of her head.  Recently dx with two unruptured intracranial aneurysms.  No reports of visual changes.  Was not placed on any precautions or new medications after dx with aneurysms.  Concerned with new symptoms that aneurysm has changed

## 2022-04-30 NOTE — ED Notes (Addendum)
0730 added note: Pt sitting in waiting room, demanding we provide a ride for her. She is aware she can receive a bus pass. Upset wanting a ride in cab or safe transport. Pt aware we have bus passes. Safe transport does not provided rides after discharge on weekends. Pt aware and has bus pass. Pt ambulated without assistance out of department.

## 2022-04-30 NOTE — Discharge Instructions (Signed)
Likely you strained a muscle in your neck, given you a muscle relaxer please take as prescribed.  You may take over-the-counter pain medication as needed.  Since you do have a aneurysm on the left side, I do not recommend strenuous activities, i.e.  weight lifting, bearing down, please keep your blood pressure well controlled, and follow-up with neurosurgery for further evaluation.  Come back to the emergency department if you develop chest pain, shortness of breath, severe abdominal pain, uncontrolled nausea, vomiting, diarrhea.

## 2022-05-04 ENCOUNTER — Other Ambulatory Visit: Payer: Self-pay | Admitting: Neurosurgery

## 2022-05-04 DIAGNOSIS — R29818 Other symptoms and signs involving the nervous system: Secondary | ICD-10-CM

## 2022-05-11 ENCOUNTER — Other Ambulatory Visit: Payer: Self-pay | Admitting: Neurosurgery

## 2022-05-11 DIAGNOSIS — I671 Cerebral aneurysm, nonruptured: Secondary | ICD-10-CM

## 2022-05-12 ENCOUNTER — Ambulatory Visit: Payer: Medicare Other | Admitting: Podiatry

## 2022-05-19 ENCOUNTER — Ambulatory Visit: Payer: Medicare Other | Admitting: Podiatry

## 2022-05-19 ENCOUNTER — Other Ambulatory Visit: Payer: Self-pay | Admitting: Neurosurgery

## 2022-05-24 ENCOUNTER — Observation Stay (HOSPITAL_COMMUNITY)
Admission: RE | Admit: 2022-05-24 | Discharge: 2022-05-25 | Disposition: A | Discharge status: Home / Self Care | Payer: Medicare HMO | Source: Ambulatory Visit | Attending: Neurosurgery | Admitting: Neurosurgery

## 2022-05-24 ENCOUNTER — Other Ambulatory Visit: Payer: Self-pay

## 2022-05-24 ENCOUNTER — Other Ambulatory Visit: Payer: Self-pay | Admitting: Neurosurgery

## 2022-05-24 DIAGNOSIS — J449 Chronic obstructive pulmonary disease, unspecified: Secondary | ICD-10-CM | POA: Diagnosis not present

## 2022-05-24 DIAGNOSIS — Z9889 Other specified postprocedural states: Secondary | ICD-10-CM | POA: Insufficient documentation

## 2022-05-24 DIAGNOSIS — F17210 Nicotine dependence, cigarettes, uncomplicated: Secondary | ICD-10-CM | POA: Diagnosis not present

## 2022-05-24 DIAGNOSIS — I10 Essential (primary) hypertension: Secondary | ICD-10-CM | POA: Diagnosis not present

## 2022-05-24 DIAGNOSIS — I671 Cerebral aneurysm, nonruptured: Secondary | ICD-10-CM

## 2022-05-24 DIAGNOSIS — Z79899 Other long term (current) drug therapy: Secondary | ICD-10-CM | POA: Insufficient documentation

## 2022-05-24 HISTORY — PX: IR ANGIO INTRA EXTRACRAN SEL INTERNAL CAROTID BILAT MOD SED: IMG5363

## 2022-05-24 HISTORY — PX: IR ANGIO VERTEBRAL SEL VERTEBRAL UNI R MOD SED: IMG5368

## 2022-05-24 HISTORY — PX: IR US GUIDE VASC ACCESS RIGHT: IMG2390

## 2022-05-24 LAB — BASIC METABOLIC PANEL
Anion gap: 8 (ref 5–15)
BUN: 8 mg/dL (ref 8–23)
CO2: 26 mmol/L (ref 22–32)
Calcium: 9 mg/dL (ref 8.9–10.3)
Chloride: 108 mmol/L (ref 98–111)
Creatinine, Ser: 0.76 mg/dL (ref 0.44–1.00)
GFR, Estimated: >60 mL/min (ref >60)
Glucose, Bld: 100 mg/dL — ABNORMAL HIGH (ref 70–99)
Potassium: 4.4 mmol/L (ref 3.5–5.1)
Sodium: 142 mmol/L (ref 135–145)

## 2022-05-24 LAB — CBC WITH DIFFERENTIAL/PLATELET
Abs Immature Granulocytes: 0.03 10*3/uL (ref 0.00–0.07)
Basophils Absolute: 0.1 10*3/uL (ref 0.0–0.1)
Basophils Relative: 1 %
Eosinophils Absolute: 0.3 10*3/uL (ref 0.0–0.5)
Eosinophils Relative: 2 %
HCT: 42.6 % (ref 36.0–46.0)
Hemoglobin: 14.1 g/dL (ref 12.0–15.0)
Immature Granulocytes: 0 %
Lymphocytes Relative: 22 %
Lymphs Abs: 2.4 10*3/uL (ref 0.7–4.0)
MCH: 31.4 pg (ref 26.0–34.0)
MCHC: 33.1 g/dL (ref 30.0–36.0)
MCV: 94.9 fL (ref 80.0–100.0)
Monocytes Absolute: 0.7 10*3/uL (ref 0.1–1.0)
Monocytes Relative: 7 %
Neutro Abs: 7.6 10*3/uL (ref 1.7–7.7)
Neutrophils Relative %: 68 %
Platelets: 268 10*3/uL (ref 150–400)
RBC: 4.49 MIL/uL (ref 3.87–5.11)
RDW: 12.9 % (ref 11.5–15.5)
WBC: 11.1 10*3/uL — ABNORMAL HIGH (ref 4.0–10.5)
nRBC: 0 % (ref 0.0–0.2)

## 2022-05-24 LAB — PROTIME-INR
INR: 1 (ref 0.8–1.2)
Prothrombin Time: 12.7 seconds (ref 11.4–15.2)

## 2022-05-24 MED ORDER — HEPARIN SODIUM (PORCINE) 1000 UNIT/ML IJ SOLN
INTRAMUSCULAR | Status: AC
  Filled 2022-05-24: qty 10

## 2022-05-24 MED ORDER — HEPARIN SODIUM (PORCINE) 1000 UNIT/ML IJ SOLN
INTRAMUSCULAR | Status: AC | PRN
  Administered 2022-05-24: 2000 [IU] via INTRAVENOUS

## 2022-05-24 MED ORDER — MIDAZOLAM HCL 2 MG/2ML IJ SOLN
INTRAMUSCULAR | Status: AC
  Filled 2022-05-24: qty 2

## 2022-05-24 MED ORDER — LIDOCAINE HCL 1 % IJ SOLN
INTRAMUSCULAR | Status: AC
  Filled 2022-05-24: qty 20

## 2022-05-24 MED ORDER — HYDROCODONE-ACETAMINOPHEN 5-325 MG PO TABS
1.0000 | ORAL_TABLET | ORAL | Status: DC | PRN
  Administered 2022-05-24: 1 via ORAL
  Administered 2022-05-25: 2 via ORAL
  Filled 2022-05-24: qty 1
  Filled 2022-05-24: qty 2

## 2022-05-24 MED ORDER — FENTANYL CITRATE (PF) 100 MCG/2ML IJ SOLN
INTRAMUSCULAR | Status: AC
  Filled 2022-05-24: qty 2

## 2022-05-24 MED ORDER — IOHEXOL 300 MG/ML  SOLN
100.0000 mL | Freq: Once | INTRAMUSCULAR | Status: AC | PRN
  Administered 2022-05-24: 60 mL via INTRA_ARTERIAL

## 2022-05-24 MED ORDER — MIDAZOLAM HCL 2 MG/2ML IJ SOLN
INTRAMUSCULAR | Status: AC | PRN
  Administered 2022-05-24: 1 mg via INTRAVENOUS

## 2022-05-24 MED ORDER — SODIUM CHLORIDE 0.9 % IV SOLN: INTRAVENOUS | Status: DC

## 2022-05-24 NOTE — Progress Notes (Signed)
Report called to Amy RN. Informed that the right groin dressing must be removed before discharge.

## 2022-05-24 NOTE — H&P (Signed)
Chief Complaint   Aneurysm  History of Present Illness  Ashley Hodge is a 62 year old woman seen for initial consultation at the request of Dr. Roderic Palau. She is referred for evaluation of a recently discovered incidental intracranial aneurysms. Briefly, the patient noted onset of right-sided arm and leg numbness and tingling about a week ago. She presented to the emergency department where workup included CT angiogram of the head demonstrating incidental aneurysms. Her symptoms resolved spontaneously but she was referred for neurosurgical evaluation. Upon questioning, patient reports similar right-sided numbness and tingling a few times in the past, usually resolving spontaneously. Patient did have some mild headache prompting return to the emergency department a few days ago. This has since improved.In regards to her wheelchair, the patient reports several years of bilateral leg pain which limits her ability to walk. She says she does not have any real back pain, but after standing or walking for more than a few minutes, she gets relatively severe pains which one down the back of her legs and into her calves. Initially she was using a cane and a rolling walker however she was put in a motorized wheelchair. She says she has undergone a lot of testing including blood vessel tests of her legs which have not revealed any abnormalities. She does note that when the pain comes on, it will resolve after she sits down for a few minutes.  Of note, the patient does report a history of medically controlled hypertension. No history of diabetes or previous heart attack or stroke. Patient does have COPD. No history of liver or kidney disease. No cancer history. She is not on any blood thinners or anti-platelet agents. She smokes one pack per day. There is no known family history of intracranial aneurysms or unexplained intracranial hemorrhage, however she says her brother did have a "brain tumor";   Past Medical History    Past Medical History:  Diagnosis Date   Anxiety    Asthma    COPD (chronic obstructive pulmonary disease) (HCC)    High cholesterol    Hypertension     Past Surgical History   Past Surgical History:  Procedure Laterality Date   TUBAL LIGATION      Social History   Social History   Tobacco Use   Smoking status: Every Day    Packs/day: 1.50    Years: 40.00    Total pack years: 60.00    Types: Cigarettes   Smokeless tobacco: Never   Tobacco comments:    down to 2 cigarettes daily 04/23/20  Vaping Use   Vaping Use: Never used  Substance Use Topics   Alcohol use: Not Currently   Drug use: Never    Medications   Prior to Admission medications   Medication Sig Start Date End Date Taking? Authorizing Provider  acetaminophen (TYLENOL) 500 MG tablet Take 1,000 mg by mouth daily as needed (Pain).   Yes [provider]  ezetimibe (ZETIA) 10 MG tablet Take 10 mg by mouth at bedtime. 05/11/21  Yes [provider]  hydrochlorothiazide (HYDRODIURIL) 12.5 MG tablet Take 12.5 mg by mouth daily. 09/07/17  Yes [provider]  losartan (COZAAR) 100 MG tablet Take 100 mg by mouth at bedtime.   Yes [provider]  ondansetron (ZOFRAN-ODT) 4 MG disintegrating tablet Take 1 tablet (4 mg total) by mouth every 8 (eight) hours as needed for nausea or vomiting. 02/01/22  Yes Elgie Congo, MD  pantoprazole (PROTONIX) 20 MG tablet Take 20 mg by mouth  2 (two) times daily. 05/11/21  Yes [provider]  rosuvastatin (CRESTOR) 20 MG tablet Take 20 mg by mouth at bedtime. 05/11/21  Yes [provider]  albuterol (VENTOLIN HFA) 108 (90 Base) MCG/ACT inhaler Inhale 3 puffs into the lungs daily as needed for wheezing or shortness of breath. Patient not taking: Reported on 02/01/2022 04/22/21   [provider]  cephALEXin (KEFLEX) 500 MG capsule Take 1 capsule (500 mg total) by mouth 4 (four) times daily. Patient not taking: Reported  on 02/01/2022 12/14/21   Blue, Soijett A, PA-C  clindamycin (CLEOCIN) 300 MG capsule Take 300 mg by mouth every 8 (eight) hours. Patient not taking: Reported on 02/01/2022 12/23/21   [provider]  cyclobenzaprine (FLEXERIL) 10 MG tablet Take 1 tablet (10 mg total) by mouth 2 (two) times daily as needed for muscle spasms. 03/08/22   Carroll Sage, PA-C  cyclobenzaprine (FLEXERIL) 10 MG tablet Take 1 tablet (10 mg total) by mouth 2 (two) times daily as needed for muscle spasms. 04/30/22   Carroll Sage, PA-C  HYDROcodone-acetaminophen (NORCO/VICODIN) 5-325 MG tablet Take 1 tablet by mouth every 4 (four) hours as needed. 02/27/22   Garlon Hatchet, PA-C  lidocaine (LIDODERM) 5 % Place 1 patch onto the skin daily. Remove & Discard patch within 12 hours or as directed by MD Patient not taking: Reported on 02/01/2022 12/10/21   Sponseller, Eugene Gavia, PA-C  methocarbamol (ROBAXIN) 500 MG tablet Take 1 tablet (500 mg total) by mouth 2 (two) times daily. Patient not taking: Reported on 02/01/2022 12/20/21   Melene Plan, DO  naproxen (NAPROSYN) 375 MG tablet Take 1 tablet (375 mg total) by mouth 2 (two) times daily. Patient not taking: Reported on 02/01/2022 01/17/22   Marita Kansas, PA-C  ondansetron (ZOFRAN) 4 MG tablet Take 1 tablet (4 mg total) by mouth every 8 (eight) hours as needed for nausea or vomiting. Patient taking differently: Take 4 mg by mouth daily as needed for nausea or vomiting. 09/09/21   Pricilla Loveless, MD  oxyCODONE (ROXICODONE) 5 MG immediate release tablet Take 1 tablet (5 mg total) by mouth every 6 (six) hours as needed for up to 10 doses for severe pain. 02/01/22   Mardene Sayer, MD  traZODone (DESYREL) 50 MG tablet Take 50 mg by mouth at bedtime. Patient not taking: Reported on 02/01/2022 12/08/21   [provider]  fluticasone (FLONASE) 50 MCG/ACT nasal spray Place 2 sprays into both nostrils daily. Patient not taking: Reported on 12/10/2019 11/04/19 01/31/20   Myra Rude, MD  ipratropium-albuterol (DUONEB) 0.5-2.5 (3) MG/3ML SOLN Take 3 mLs by nebulization every 6 (six) hours as needed (Wheezing and SOB). Patient not taking: Reported on 12/25/2019 12/11/19 01/31/20  Marguerita Merles Latif, DO    Allergies   Allergies  Allergen Reactions   Lisinopril Other (See Comments) and Cough    cough    Review of Systems  ROS  Neurologic Exam  Awake, alert, oriented Memory and concentration grossly intact Speech fluent, appropriate CN grossly intact Motor exam: Upper Extremities Deltoid Bicep Tricep Grip  Right 5/5 5/5 5/5 5/5  Left 5/5 5/5 5/5 5/5   Lower Extremities IP Quad PF DF EHL  Right 5/5 5/5 5/5 5/5 5/5  Left 5/5 5/5 5/5 5/5 5/5   Sensation grossly intact to LT  Impression  - 62 y.o. 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 We have also reviewed in detail the risks, benefits, and alternatives to the procedure. All questions were answered and Ashley Hodge provided informed consent to proceed.  Lisbeth Renshaw, MD [facility]

## 2022-05-24 NOTE — Brief Op Note (Signed)
  NEUROSURGERY BRIEF OPERATIVE  NOTE   PREOP DX: Cerebral aneurysm  POSTOP DX: Same  PROCEDURE: Diagnostic cerebral angiogram  SURGEON: Dr. Consuella Lose, MD  ANESTHESIA: IV Sedation with Local  APPROACH: Right trans-femoral  EBL: Minimal  SPECIMENS: None  COMPLICATIONS: None  CONDITION: Stable to recovery  FINDINGS (Full report in [facility]): 1. 4.7 x 3.0 mm posteriorly/superiorly projecting LICA terminus aneurysm 2. 2.1 x 2.0 mm Acom aneurysm fills from LICA injection 3. No other aneurysms, AVM, or fistulas seen.    Consuella Lose, MD [facility]

## 2022-05-24 NOTE — Progress Notes (Signed)
Up and walked and tolerated well; right groin stable, no bleeding or hematoma 

## 2022-05-24 NOTE — Discharge Instructions (Signed)
Femoral Site Care This sheet gives you information about how to care for yourself after your procedure. Your health care provider may also give you more specific instructions. If you have problems or questions, contact your health care provider. What can I expect after the procedure?  After the procedure, it is common to have: Bruising that usually fades within 1-2 weeks. Tenderness at the site. Follow these instructions at home: Wound care Follow instructions from your health care provider about how to take care of your insertion site. Make sure you: Wash your hands with soap and water before you change your bandage (dressing). If soap and water are not available, use hand sanitizer. Remove your dressing as told by your health care provider. In 24 hours Do not take baths, swim, or use a hot tub until your health care provider approves. You may shower 24-48 hours after the procedure or as told by your health care provider. Gently wash the site with plain soap and water. Pat the area dry with a clean towel. Do not rub the site. This may cause bleeding. Do not apply powder or lotion to the site. Keep the site clean and dry. Check your femoral site every day for signs of infection. Check for: Redness, swelling, or pain. Fluid or blood. Warmth. Pus or a bad smell. Activity For the first 2-3 days after your procedure, or as long as directed: Avoid climbing stairs as much as possible. Do not squat. Do not lift anything that is heavier than 10 lb (4.5 kg), or the limit that you are told, until your health care provider says that it is safe. For 5 days Rest as directed. Avoid sitting for a long time without moving. Get up to take short walks every 1-2 hours. Do not drive for 24 hours if you were given a medicine to help you relax (sedative). General instructions Take over-the-counter and prescription medicines only as told by your health care provider. Keep all follow-up visits as told by  your health care provider. This is important. Contact a health care provider if you have: A fever or chills. You have redness, swelling, or pain around your insertion site. Get help right away if: The catheter insertion area swells very fast. You pass out. You suddenly start to sweat or your skin gets clammy. The catheter insertion area is bleeding, and the bleeding does not stop when you hold steady pressure on the area. The area near or just beyond the catheter insertion site becomes pale, cool, tingly, or numb. These symptoms may represent a serious problem that is an emergency. Do not wait to see if the symptoms will go away. Get medical help right away. Call your local emergency services (911 in the [place] Do not drive yourself to the hospital. Summary After the procedure, it is common to have bruising that usually fades within 1-2 weeks. Check your femoral site every day for signs of infection. Do not lift anything that is heavier than 10 lb (4.5 kg), or the limit that you are told, until your health care provider says that it is safe. This information is not intended to replace advice given to you by your health care provider. Make sure you discuss any questions you have with your health care provider. Document Revised: 05/15/2017 Document Reviewed: 05/15/2017 Elsevier Patient Education  2020 [employer] 

## 2022-05-24 NOTE — Progress Notes (Signed)
Per MD, pt to come back to short stay to recover after arterial stick, once bedrest is over, pt to transfer to the floor for overnight observation due to no one being at home with the patient.

## 2022-05-25 DIAGNOSIS — I671 Cerebral aneurysm, nonruptured: Secondary | ICD-10-CM | POA: Diagnosis not present

## 2022-05-25 NOTE — Discharge Summary (Signed)
Physician Discharge Summary  Patient ID: Ashley Hodge MRN: [id number] DOB/AGE: [birth year] 62 y.o.  Admit date: 05/24/2022 Discharge date: 05/25/2022  Admission Diagnoses:  Cerebral aneurysm  Discharge Diagnoses:  Same Principal Problem:   Cerebral aneurysm   Discharged Condition: Stable  Hospital Course:  Ashley Hodge is a 62 y.o. female admitted after uncomplicated angiogram due to lack of home family support. She was d/c'ed home on POD#1 in baseline condition.  Treatments: Diagnostic angiogram  Discharge Exam: Blood pressure 123/62, pulse 76, temperature 98.2 F (36.8 C), temperature source Oral, resp. rate 18, height 5' (1.524 m), weight 72.6 kg, SpO2 94 %. Awake, alert, oriented Speech fluent, appropriate CN grossly intact 5/5 BUE/BLE Wound c/d/i  Disposition: Discharge disposition: 01-Home or Self Care       Discharge Instructions     Call MD for:  redness, tenderness, or signs of infection (pain, swelling, redness, odor or green/yellow discharge around incision site)   Complete by: As directed    Call MD for:  temperature >100.4   Complete by: As directed    Diet - low sodium heart healthy   Complete by: As directed    Discharge instructions   Complete by: As directed    Walk at home as much as possible, at least 4 times / day   Increase activity slowly   Complete by: As directed    Lifting restrictions   Complete by: As directed    No lifting > 10 lbs   May shower / Bathe   Complete by: As directed    48 hours after surgery   May walk up steps   Complete by: As directed    No dressing needed   Complete by: As directed    Other Restrictions   Complete by: As directed    No bending/twisting at waist      Allergies as of 05/25/2022       Reactions   Lisinopril Other (See Comments), Cough   cough        Medication List     STOP taking these medications    cephALEXin 500 MG capsule Commonly known as: KEFLEX   clindamycin 300 MG  capsule Commonly known as: CLEOCIN   cyclobenzaprine 10 MG tablet Commonly known as: FLEXERIL   HYDROcodone-acetaminophen 5-325 MG tablet Commonly known as: NORCO/VICODIN   lidocaine 5 % Commonly known as: Lidoderm   methocarbamol 500 MG tablet Commonly known as: ROBAXIN   naproxen 375 MG tablet Commonly known as: NAPROSYN   ondansetron 4 MG tablet Commonly known as: ZOFRAN   oxyCODONE 5 MG immediate release tablet Commonly known as: Roxicodone       TAKE these medications    acetaminophen 500 MG tablet Commonly known as: TYLENOL Take 1,000 mg by mouth daily as needed (Pain).   losartan 100 MG tablet Commonly known as: COZAAR Take 100 mg by mouth at bedtime.   ondansetron 4 MG disintegrating tablet Commonly known as: ZOFRAN-ODT Take 1 tablet (4 mg total) by mouth every 8 (eight) hours as needed for nausea or vomiting.   pantoprazole 20 MG tablet Commonly known as: PROTONIX Take 20 mg by mouth 2 (two) times daily.   rosuvastatin 20 MG tablet Commonly known as: CRESTOR Take 20 mg by mouth at bedtime.               Discharge Care Instructions  (From admission, onward)           Start     Ordered  05/25/22 0000  No dressing needed        05/25/22 1001            Follow-up Information     Consuella Lose, MD Follow up.   Specialty: Neurosurgery Contact information: [address] [place] [address] [phone]                 Signed: Jairo Ben 05/25/2022, 10:02 AM

## 2022-05-25 NOTE — Plan of Care (Signed)

## 2022-06-04 ENCOUNTER — Other Ambulatory Visit: Payer: Medicare HMO

## 2022-06-05 ENCOUNTER — Emergency Department (HOSPITAL_COMMUNITY)
Admission: EM | Admit: 2022-06-05 | Discharge: 2022-06-05 | Disposition: A | Discharge status: Home / Self Care | Payer: Medicare HMO | Attending: Emergency Medicine | Admitting: Emergency Medicine

## 2022-06-05 ENCOUNTER — Emergency Department (HOSPITAL_COMMUNITY): Payer: Medicare HMO

## 2022-06-05 DIAGNOSIS — I10 Essential (primary) hypertension: Secondary | ICD-10-CM | POA: Insufficient documentation

## 2022-06-05 DIAGNOSIS — J449 Chronic obstructive pulmonary disease, unspecified: Secondary | ICD-10-CM | POA: Insufficient documentation

## 2022-06-05 DIAGNOSIS — I671 Cerebral aneurysm, nonruptured: Secondary | ICD-10-CM

## 2022-06-05 DIAGNOSIS — R079 Chest pain, unspecified: Secondary | ICD-10-CM | POA: Diagnosis present

## 2022-06-05 DIAGNOSIS — R0789 Other chest pain: Secondary | ICD-10-CM | POA: Diagnosis not present

## 2022-06-05 DIAGNOSIS — Z79899 Other long term (current) drug therapy: Secondary | ICD-10-CM | POA: Diagnosis not present

## 2022-06-05 LAB — CBC WITH DIFFERENTIAL/PLATELET
Abs Immature Granulocytes: 0.05 10*3/uL (ref 0.00–0.07)
Basophils Absolute: 0.1 10*3/uL (ref 0.0–0.1)
Basophils Relative: 0 %
Eosinophils Absolute: 0.2 10*3/uL (ref 0.0–0.5)
Eosinophils Relative: 2 %
HCT: 41.2 % (ref 36.0–46.0)
Hemoglobin: 13.6 g/dL (ref 12.0–15.0)
Immature Granulocytes: 0 %
Lymphocytes Relative: 18 %
Lymphs Abs: 2.5 10*3/uL (ref 0.7–4.0)
MCH: 31.7 pg (ref 26.0–34.0)
MCHC: 33 g/dL (ref 30.0–36.0)
MCV: 96 fL (ref 80.0–100.0)
Monocytes Absolute: 0.9 10*3/uL (ref 0.1–1.0)
Monocytes Relative: 7 %
Neutro Abs: 9.9 10*3/uL — ABNORMAL HIGH (ref 1.7–7.7)
Neutrophils Relative %: 73 %
Platelets: 302 10*3/uL (ref 150–400)
RBC: 4.29 MIL/uL (ref 3.87–5.11)
RDW: 12.9 % (ref 11.5–15.5)
WBC: 13.7 10*3/uL — ABNORMAL HIGH (ref 4.0–10.5)
nRBC: 0 % (ref 0.0–0.2)

## 2022-06-05 LAB — BASIC METABOLIC PANEL
Anion gap: 8 (ref 5–15)
BUN: 15 mg/dL (ref 8–23)
CO2: 24 mmol/L (ref 22–32)
Calcium: 8.9 mg/dL (ref 8.9–10.3)
Chloride: 107 mmol/L (ref 98–111)
Creatinine, Ser: 0.76 mg/dL (ref 0.44–1.00)
GFR, Estimated: >60 mL/min (ref >60)
Glucose, Bld: 121 mg/dL — ABNORMAL HIGH (ref 70–99)
Potassium: 3.8 mmol/L (ref 3.5–5.1)
Sodium: 139 mmol/L (ref 135–145)

## 2022-06-05 LAB — TROPONIN I (HIGH SENSITIVITY)
Troponin I (High Sensitivity): 2 ng/L (ref <18)
Troponin I (High Sensitivity): 2 ng/L (ref <18)

## 2022-06-05 MED ORDER — PANTOPRAZOLE SODIUM 40 MG PO TBEC
40.0000 mg | DELAYED_RELEASE_TABLET | Freq: Once | ORAL | Status: AC
  Administered 2022-06-05: 40 mg via ORAL
  Filled 2022-06-05: qty 1

## 2022-06-05 MED ORDER — ROSUVASTATIN CALCIUM 20 MG PO TABS
20.0000 mg | ORAL_TABLET | Freq: Once | ORAL | Status: AC
  Administered 2022-06-05: 20 mg via ORAL
  Filled 2022-06-05: qty 1

## 2022-06-05 MED ORDER — LOSARTAN POTASSIUM 50 MG PO TABS
100.0000 mg | ORAL_TABLET | Freq: Once | ORAL | Status: AC
  Administered 2022-06-05: 100 mg via ORAL
  Filled 2022-06-05: qty 2

## 2022-06-05 NOTE — ED Notes (Signed)
Patient transported to CT 

## 2022-06-05 NOTE — ED Provider Notes (Signed)
[facility] AT [facility] Provider Note   CSN: [id number] Arrival date & time: 06/05/22  1743     History  Chief Complaint  Patient presents with   Chest Pain    Ashley Hodge is a 62 y.o. female.  Pt is a 62 yo female with a pmhx significant for htn, asthma, hld, copd, anxiety, and cerebral aneurysm.  Pt was diagnosed recently with 2 aneurysms.  Dr. Conchita Paris plans to clip them soon.  Since finding out about the aneurysm, the pt has been very anxious.  She took a [employer], political party and felt dizzy in the shower.  She then became anxious and developed cp.  She called EMS who gave her an asa.  CP is gone and she feels better.       Home Medications Prior to Admission medications   Medication Sig Start Date End Date Taking? Authorizing Provider  acetaminophen (TYLENOL) 500 MG tablet Take 1,000 mg by mouth daily as needed (Pain).    [provider]  losartan (COZAAR) 100 MG tablet Take 100 mg by mouth at bedtime.    [provider]  ondansetron (ZOFRAN-ODT) 4 MG disintegrating tablet Take 1 tablet (4 mg total) by mouth every 8 (eight) hours as needed for nausea or vomiting. 02/01/22   Mardene Sayer, MD  pantoprazole (PROTONIX) 20 MG tablet Take 20 mg by mouth 2 (two) times daily. 05/11/21   [provider]  rosuvastatin (CRESTOR) 20 MG tablet Take 20 mg by mouth at bedtime. 05/11/21   [provider]  fluticasone (FLONASE) 50 MCG/ACT nasal spray Place 2 sprays into both nostrils daily. Patient not taking: Reported on 12/10/2019 11/04/19 01/31/20  Myra Rude, MD  ipratropium-albuterol (DUONEB) 0.5-2.5 (3) MG/3ML SOLN Take 3 mLs by nebulization every 6 (six) hours as needed (Wheezing and SOB). Patient not taking: Reported on 12/25/2019 12/11/19 01/31/20  Marguerita Merles Latif, DO      Allergies    Lisinopril    Review of Systems   Review of Systems  Cardiovascular:  Positive for chest pain.  Neurological:   Positive for dizziness.  All other systems reviewed and are negative.   Physical Exam Updated Vital Signs BP 135/81 (BP Location: Left Arm)   Pulse 81   Temp 97.9 F (36.6 C) (Oral)   Resp 18   SpO2 96%  Physical Exam Vitals and nursing note reviewed.  Constitutional:      Appearance: She is well-developed.  HENT:     Head: Normocephalic and atraumatic.  Eyes:     Extraocular Movements: Extraocular movements intact.     Pupils: Pupils are equal, round, and reactive to light.  Cardiovascular:     Rate and Rhythm: Normal rate and regular rhythm.     Heart sounds: Normal heart sounds.  Pulmonary:     Effort: Pulmonary effort is normal.     Breath sounds: Normal breath sounds.  Abdominal:     General: Bowel sounds are normal.     Palpations: Abdomen is soft.  Musculoskeletal:        General: Normal range of motion.     Cervical back: Normal range of motion and neck supple.  Skin:    General: Skin is warm.     Capillary Refill: Capillary refill takes less than 2 seconds.  Neurological:     General: No focal deficit present.     Mental Status: She is alert and oriented to person, place, and time.  Psychiatric:  Mood and Affect: Mood normal.        Behavior: Behavior normal.     ED Results / Procedures / Treatments   Labs (all labs ordered are listed, but only abnormal results are displayed) Labs Reviewed  BASIC METABOLIC PANEL - Abnormal; Notable for the following components:      Result Value   Glucose, Bld 121 (*)    All other components within normal limits  CBC WITH DIFFERENTIAL/PLATELET - Abnormal; Notable for the following components:   WBC 13.7 (*)    Neutro Abs 9.9 (*)    All other components within normal limits  TROPONIN I (HIGH SENSITIVITY)  TROPONIN I (HIGH SENSITIVITY)    EKG EKG Interpretation  Date/Time:  Sunday June 05 2022 17:54:01 EST Ventricular Rate:  86 PR Interval:  134 QRS Duration: 82 QT Interval:  356 QTC  Calculation: 426 R Axis:   14 Text Interpretation: Normal sinus rhythm Low voltage QRS Cannot rule out Anterior infarct , age undetermined Abnormal ECG When compared with ECG of 22-Apr-2022 23:41, PREVIOUS ECG IS PRESENT No significant change since last tracing Confirmed by Isla Pence [phone]) on 06/05/2022 9:16:14 PM  Radiology CT Head Wo Contrast  Result Date: 06/05/2022 CLINICAL DATA:  Headache EXAM: CT HEAD WITHOUT CONTRAST TECHNIQUE: Contiguous axial images were obtained from the base of the skull through the vertex without intravenous contrast. RADIATION DOSE REDUCTION: This exam was performed according to the departmental dose-optimization program which includes automated exposure control, adjustment of the mA and/or kV according to patient size and/or use of iterative reconstruction technique. COMPARISON:  None Available. FINDINGS: Brain: There is no mass, hemorrhage or extra-axial collection. The size and configuration of the ventricles and extra-axial CSF spaces are normal. The brain parenchyma is normal, without acute or chronic infarction. Vascular: Atherosclerotic calcification of the vertebral and internal carotid arteries at the skull base. No abnormal hyperdensity of the major intracranial arteries or dural venous sinuses. Skull: The visualized skull base, calvarium and extracranial soft tissues are normal. Sinuses/Orbits: No fluid levels or advanced mucosal thickening of the visualized paranasal sinuses. No mastoid or middle ear effusion. The orbits are normal. IMPRESSION: 1. No acute intracranial abnormality. 2. Atherosclerotic calcification of the vertebral and internal carotid arteries at the skull base. Electronically Signed   By: Ulyses Jarred M.D.   On: 06/05/2022 21:15   DG Chest 2 View  Result Date: 06/05/2022 CLINICAL DATA:  Chest pain, dizziness. EXAM: CHEST - 2 VIEW COMPARISON:  04/04/2022. FINDINGS: The heart size and mediastinal contours are within normal limits. There is  atherosclerotic calcification of the aorta. No consolidation, effusion, or pneumothorax. No acute osseous abnormality. IMPRESSION: No active cardiopulmonary disease. Electronically Signed   By: Brett Fairy M.D.   On: 06/05/2022 21:15    Procedures Procedures    Medications Ordered in ED Medications  losartan (COZAAR) tablet 100 mg (100 mg Oral Given 06/05/22 2040)  rosuvastatin (CRESTOR) tablet 20 mg (20 mg Oral Given 06/05/22 2040)  pantoprazole (PROTONIX) EC tablet 40 mg (40 mg Oral Given 06/05/22 2040)    ED Course/ Medical Decision Making/ A&P                             Medical Decision Making Amount and/or Complexity of Data Reviewed Radiology: ordered.  Risk Prescription drug management.   This patient presents to the ED for concern of cp, this involves an extensive number of treatment options, and is a complaint  that carries with it a high risk of complications and morbidity.  The differential diagnosis includes cardiac, pulm, gi, anxiety   Co morbidities that complicate the patient evaluation  htn, asthma, hld, copd, anxiety, and cerebral aneurysm   Additional history obtained:  Additional history obtained from epic chart review    Lab Tests:  I Ordered, and personally interpreted labs.  The pertinent results include:  cbc with wbc sl elevated at 13.7, bmp nl, trop nl times 2   Imaging Studies ordered:  I ordered imaging studies including cxr and ct head  I independently visualized and interpreted imaging which showed  CXR: No active cardiopulmonary disease.  CT head: . No acute intracranial abnormality.  2. Atherosclerotic calcification of the vertebral and internal  carotid arteries at the skull base.   I agree with the radiologist interpretation   Cardiac Monitoring:  The patient was maintained on a cardiac monitor.  I personally viewed and interpreted the cardiac monitored which showed an underlying rhythm of: nsr   Medicines ordered and  prescription drug management:  I have reviewed the patients home medicines and have made adjustments as needed   Problem List / ED Course:  CP:  sounds atypical.  Cardiac eval neg.  CXR nl.  Doubt PE.  Pt is stable for d/c.  Return if worse.    Reevaluation:  After the interventions noted above, I reevaluated the patient and found that they have :improved   Social Determinants of Health:  Lives in a hotel   Dispostion:  After consideration of the diagnostic results and the patients response to treatment, I feel that the patent would benefit from discharge with outpatient f/u.          Final Clinical Impression(s) / ED Diagnoses Final diagnoses:  Atypical chest pain  Cerebral aneurysm, nonruptured    Rx / DC Orders ED Discharge Orders     None         Isla Pence, MD 06/05/22 2149

## 2022-06-05 NOTE — ED Notes (Signed)
Patient verbalizes understanding of discharge instructions. Opportunity for questioning and answers were provided. Armband removed by staff, pt discharged from ED.

## 2022-06-05 NOTE — ED Notes (Signed)
Pt was able to ambulate to restroom and back without assistance  

## 2022-06-05 NOTE — ED Provider Triage Note (Signed)
Emergency Medicine Provider Triage Evaluation Note  LULABELLE DESTA , a 62 y.o. female  was evaluated in triage. Patient arrives via EMS with complaint of chest pain. Patient reports becoming dizzy upon standing quickly, then developed chest pain. Patient is not having any chest pain at present. History of cerebral aneurysm, with surgery scheduled next week.   Review of Systems  Positive: Dizziness, chest pain Negative: Shortness of breath, nausea, vomiting, abdominal pain  Physical Exam  BP (!) 142/74   Pulse 99   Temp 98.6 F (37 C) (Oral)   Resp 16   SpO2 95%  Gen:   Awake, no distress   Resp:  Normal effort  MSK:   Moves extremities without difficulty  Other:    Medical Decision Making  Medically screening exam initiated at 6:00 PM.  Appropriate orders placed.  ANYE BROSE was informed that the remainder of the evaluation will be completed by another provider, this initial triage assessment does not replace that evaluation, and the importance of remaining in the ED until their evaluation is complete.     Etta Quill, NP 06/05/22 1807

## 2022-06-05 NOTE — ED Triage Notes (Signed)
Pt bib ems from home with sudden onset chest pain while in the shower. No radiation, no shob. Hypertensive with ems. 172/78. HR 102.  20g RAC. Given 324mg  aspirin. Did not give nitro due to brain aneurism.

## 2022-06-06 ENCOUNTER — Other Ambulatory Visit (HOSPITAL_COMMUNITY): Payer: Self-pay | Admitting: Neurosurgery

## 2022-06-06 ENCOUNTER — Other Ambulatory Visit: Payer: Self-pay | Admitting: Neurosurgery

## 2022-06-06 DIAGNOSIS — I671 Cerebral aneurysm, nonruptured: Secondary | ICD-10-CM

## 2022-06-13 NOTE — Progress Notes (Signed)
Surgical Instructions    Your procedure is scheduled on Tuesday February 6.  Report to [facility] Main Entrance "A" at 9:45 A.M., then check in with the Admitting office.  Call this number if you have problems the morning of surgery:  [phone]   If you have any questions prior to your surgery date call [phone]: Open Monday-Friday 8am-4pm If you experience any cold or flu symptoms such as cough, fever, chills, shortness of breath, etc. between now and your scheduled surgery, please notify us at the above number     Remember:  Do not eat or drink anything after midnight the night before your surgery     Take these medicines the morning of surgery with A SIP OF WATER:  albuterol (VENTOLIN HFA) 108 (90 Base) MCG/ACT inhaler if needed. Bring to the hospital with you.  ezetimibe (ZETIA)  ondansetron (ZOFRAN-ODT) if needed  As of today, STOP taking any Aspirin (unless otherwise instructed by your surgeon) Aleve, Naproxen, Ibuprofen, Motrin, Advil, Goody's, BC's, all herbal medications, fish oil, and all vitamins.           Do not wear jewelry or makeup. Do not wear lotions, powders, perfumes/cologne or deodorant. Do not shave 48 hours prior to surgery.  Men may shave face and neck. Do not bring valuables to the hospital. Do not wear nail polish, gel polish, artificial nails, or any other type of covering on natural nails (fingers and toes) If you have artificial nails or gel coating that need to be removed by a nail salon, please have this removed prior to surgery. Artificial nails or gel coating may interfere with anesthesia's ability to adequately monitor your vital signs.  [facility] is not responsible for any belongings or valuables.    Do NOT Smoke (Tobacco/Vaping)  24 hours prior to your procedure  If you use a CPAP at night, you may bring your mask for your overnight stay.   Contacts, glasses, hearing aids, dentures or partials may not be worn into surgery, please bring  cases for these belongings   For patients admitted to the hospital, discharge time will be determined by your treatment team.   Patients discharged the day of surgery will not be allowed to drive home, and someone needs to stay with them for 24 hours.   SURGICAL WAITING ROOM VISITATION Patients having surgery or a procedure may have no more than 2 support people in the waiting area - these visitors may rotate.   Children under the age of 20 must have an adult with them who is not the patient. If the patient needs to stay at the hospital during part of their recovery, the visitor guidelines for inpatient rooms apply. Pre-op nurse will coordinate an appropriate time for 1 support person to accompany patient in pre-op.  This support person may not rotate.   Please refer to [email or web address] for the visitor guidelines for Inpatients (after your surgery is over and you are in a regular room).    Special instructions:    Oral Hygiene is also important to reduce your risk of infection.  Remember - BRUSH YOUR TEETH THE MORNING OF SURGERY WITH YOUR REGULAR TOOTHPASTE   [facility]- Preparing For Surgery  Before surgery, you can play an important role. Because skin is not sterile, your skin needs to be as free of germs as possible. You can reduce the number of germs on your skin by washing with CHG (chlorahexidine gluconate) Soap before surgery.  CHG is an antiseptic cleaner  which kills germs and bonds with the skin to continue killing germs even after washing.     Please do not use if you have an allergy to CHG or antibacterial soaps. If your skin becomes reddened/irritated stop using the CHG.  Do not shave (including legs and underarms) for at least 48 hours prior to first CHG shower. It is OK to shave your face.  Please follow these instructions carefully.     Shower the NIGHT BEFORE SURGERY and the MORNING OF SURGERY with CHG Soap.   If  you chose to wash your hair, wash your hair first as usual with your normal shampoo. After you shampoo, rinse your hair and body thoroughly to remove the shampoo.  Then [employer] and genitals (private parts) with your normal soap and rinse thoroughly to remove soap.  After that Use CHG Soap as you would any other liquid soap. You can apply CHG directly to the skin and wash gently with a scrungie or a clean washcloth.   Apply the CHG Soap to your body ONLY FROM THE NECK DOWN.  Do not use on open wounds or open sores. Avoid contact with your eyes, ears, mouth and genitals (private parts). Wash Face and genitals (private parts)  with your normal soap.   Wash thoroughly, paying special attention to the area where your surgery will be performed.  Thoroughly rinse your body with warm water from the neck down.  DO NOT shower/wash with your normal soap after using and rinsing off the CHG Soap.  Pat yourself dry with a CLEAN TOWEL.  Wear CLEAN PAJAMAS to bed the night before surgery  Place CLEAN SHEETS on your bed the night before your surgery  DO NOT SLEEP WITH PETS.   Day of Surgery:  Take a shower with CHG soap. Wear Clean/Comfortable clothing the morning of surgery Do not apply any deodorants/lotions.   Remember to brush your teeth WITH YOUR REGULAR TOOTHPASTE.    If you received a COVID test during your pre-op visit, it is requested that you wear a mask when out in public, stay away from anyone that may not be feeling well, and notify your surgeon if you develop symptoms. If you have been in contact with anyone that has tested positive in the last 10 days, please notify your surgeon.    Please read over the following fact sheets that you were given.

## 2022-06-14 ENCOUNTER — Other Ambulatory Visit: Payer: Self-pay

## 2022-06-14 ENCOUNTER — Encounter (HOSPITAL_COMMUNITY)
Admission: RE | Admit: 2022-06-14 | Discharge: 2022-06-14 | Disposition: A | Discharge status: Home / Self Care | Payer: Medicare HMO | Source: Ambulatory Visit | Attending: Neurosurgery | Admitting: Neurosurgery

## 2022-06-14 ENCOUNTER — Encounter (HOSPITAL_COMMUNITY): Payer: Self-pay

## 2022-06-14 VITALS — BP 137/77 | HR 79 | Temp 97.6°F | Resp 18 | Ht 60.0 in | Wt 158.2 lb

## 2022-06-14 DIAGNOSIS — Z01812 Encounter for preprocedural laboratory examination: Secondary | ICD-10-CM | POA: Diagnosis present

## 2022-06-14 DIAGNOSIS — Z01818 Encounter for other preprocedural examination: Secondary | ICD-10-CM

## 2022-06-14 HISTORY — DX: Prediabetes: R73.03

## 2022-06-14 HISTORY — DX: Gastro-esophageal reflux disease without esophagitis: K21.9

## 2022-06-14 LAB — CBC WITH DIFFERENTIAL/PLATELET
Abs Immature Granulocytes: 0.03 10*3/uL (ref 0.00–0.07)
Basophils Absolute: 0.1 10*3/uL (ref 0.0–0.1)
Basophils Relative: 0 %
Eosinophils Absolute: 0.3 10*3/uL (ref 0.0–0.5)
Eosinophils Relative: 3 %
HCT: 41.7 % (ref 36.0–46.0)
Hemoglobin: 14.1 g/dL (ref 12.0–15.0)
Immature Granulocytes: 0 %
Lymphocytes Relative: 22 %
Lymphs Abs: 2.6 10*3/uL (ref 0.7–4.0)
MCH: 32.1 pg (ref 26.0–34.0)
MCHC: 33.8 g/dL (ref 30.0–36.0)
MCV: 95 fL (ref 80.0–100.0)
Monocytes Absolute: 0.9 10*3/uL (ref 0.1–1.0)
Monocytes Relative: 7 %
Neutro Abs: 8.2 10*3/uL — ABNORMAL HIGH (ref 1.7–7.7)
Neutrophils Relative %: 68 %
Platelets: 273 10*3/uL (ref 150–400)
RBC: 4.39 MIL/uL (ref 3.87–5.11)
RDW: 12.3 % (ref 11.5–15.5)
WBC: 12.1 10*3/uL — ABNORMAL HIGH (ref 4.0–10.5)
nRBC: 0 % (ref 0.0–0.2)

## 2022-06-14 LAB — BASIC METABOLIC PANEL
Anion gap: 11 (ref 5–15)
BUN: 14 mg/dL (ref 8–23)
CO2: 25 mmol/L (ref 22–32)
Calcium: 9.2 mg/dL (ref 8.9–10.3)
Chloride: 104 mmol/L (ref 98–111)
Creatinine, Ser: 0.75 mg/dL (ref 0.44–1.00)
GFR, Estimated: >60 mL/min (ref >60)
Glucose, Bld: 99 mg/dL (ref 70–99)
Potassium: 4.3 mmol/L (ref 3.5–5.1)
Sodium: 140 mmol/L (ref 135–145)

## 2022-06-14 LAB — URINALYSIS, ROUTINE W REFLEX MICROSCOPIC
Bilirubin Urine: NEGATIVE
Glucose, UA: NEGATIVE mg/dL
Hgb urine dipstick: NEGATIVE
Ketones, ur: NEGATIVE mg/dL
Nitrite: NEGATIVE
Protein, ur: NEGATIVE mg/dL
Specific Gravity, Urine: 1.016 (ref 1.005–1.030)
pH: 5 (ref 5.0–8.0)

## 2022-06-14 LAB — APTT: aPTT: 27 seconds (ref 24–36)

## 2022-06-14 LAB — PROTIME-INR
INR: 1 (ref 0.8–1.2)
Prothrombin Time: 12.7 seconds (ref 11.4–15.2)

## 2022-06-14 NOTE — Progress Notes (Signed)
PCP - Dossie Der Asad A. Manuella Ghazi MD Cardiologist - denies  PPM/ICD - denies Device Orders -  Rep Notified -   Chest x-ray - 06/05/22 EKG - 06/06/22 Stress Test - denies ECHO - denies Cardiac Cath - denies  Sleep Study - no CPAP -   Fasting Blood Sugar - no Checks Blood Sugar _____ times a day  Last dose of GLP1 agonist-  na GLP1 instructions:   Blood Thinner Instructions:na Aspirin Instructions:na  ERAS Protcol -no PRE-SURGERY Ensure or G2-   COVID TEST- na   Anesthesia review: no  Patient denies shortness of breath, fever, cough and chest pain at PAT appointment   All instructions explained to the patient, with a verbal understanding of the material. Patient agrees to go over the instructions while at home for a better understanding. Patient also instructed to wear a mask when out in public prior to surgery. The opportunity to ask questions was provided.

## 2022-06-15 ENCOUNTER — Other Ambulatory Visit: Payer: Self-pay | Admitting: Neurosurgery

## 2022-06-21 ENCOUNTER — Inpatient Hospital Stay (HOSPITAL_COMMUNITY): Payer: Medicare HMO | Admitting: Certified Registered"

## 2022-06-21 ENCOUNTER — Inpatient Hospital Stay (HOSPITAL_COMMUNITY)
Admission: RE | Admit: 2022-06-21 | Discharge: 2022-06-21 | Disposition: A | Discharge status: Home / Self Care | Payer: Medicare HMO | Source: Ambulatory Visit | Attending: Neurosurgery | Admitting: Neurosurgery

## 2022-06-21 ENCOUNTER — Other Ambulatory Visit: Payer: Self-pay

## 2022-06-21 ENCOUNTER — Inpatient Hospital Stay (HOSPITAL_COMMUNITY)
Admission: RE | Admit: 2022-06-21 | Discharge: 2022-06-22 | DRG: 027 | Disposition: A | Discharge status: Home / Self Care | Payer: Medicare HMO | Source: Ambulatory Visit | Attending: Neurosurgery | Admitting: Neurosurgery

## 2022-06-21 ENCOUNTER — Encounter (HOSPITAL_COMMUNITY)
Admission: RE | Disposition: A | Discharge status: Home / Self Care | Payer: Self-pay | Source: Ambulatory Visit | Attending: Neurosurgery

## 2022-06-21 DIAGNOSIS — F17210 Nicotine dependence, cigarettes, uncomplicated: Secondary | ICD-10-CM | POA: Diagnosis present

## 2022-06-21 DIAGNOSIS — F17200 Nicotine dependence, unspecified, uncomplicated: Secondary | ICD-10-CM

## 2022-06-21 DIAGNOSIS — R7303 Prediabetes: Secondary | ICD-10-CM | POA: Diagnosis present

## 2022-06-21 DIAGNOSIS — I10 Essential (primary) hypertension: Secondary | ICD-10-CM

## 2022-06-21 DIAGNOSIS — Z79891 Long term (current) use of opiate analgesic: Secondary | ICD-10-CM

## 2022-06-21 DIAGNOSIS — J449 Chronic obstructive pulmonary disease, unspecified: Secondary | ICD-10-CM

## 2022-06-21 DIAGNOSIS — Z79899 Other long term (current) drug therapy: Secondary | ICD-10-CM | POA: Diagnosis not present

## 2022-06-21 DIAGNOSIS — K219 Gastro-esophageal reflux disease without esophagitis: Secondary | ICD-10-CM | POA: Diagnosis present

## 2022-06-21 DIAGNOSIS — J4489 Other specified chronic obstructive pulmonary disease: Secondary | ICD-10-CM | POA: Diagnosis present

## 2022-06-21 DIAGNOSIS — F419 Anxiety disorder, unspecified: Secondary | ICD-10-CM | POA: Diagnosis present

## 2022-06-21 DIAGNOSIS — E7800 Pure hypercholesterolemia, unspecified: Secondary | ICD-10-CM | POA: Diagnosis present

## 2022-06-21 DIAGNOSIS — I671 Cerebral aneurysm, nonruptured: Secondary | ICD-10-CM

## 2022-06-21 DIAGNOSIS — Z888 Allergy status to other drugs, medicaments and biological substances status: Secondary | ICD-10-CM | POA: Diagnosis not present

## 2022-06-21 HISTORY — PX: RADIOLOGY WITH ANESTHESIA: SHX6223

## 2022-06-21 HISTORY — PX: IR ANGIOGRAM FOLLOW UP STUDY: IMG697

## 2022-06-21 HISTORY — PX: IR TRANSCATH/EMBOLIZ: IMG695

## 2022-06-21 LAB — MRSA NEXT GEN BY PCR, NASAL: MRSA by PCR Next Gen: NOT DETECTED

## 2022-06-21 SURGERY — IR WITH ANESTHESIA
Anesthesia: General

## 2022-06-21 MED ORDER — CHLORHEXIDINE GLUCONATE CLOTH 2 % EX PADS: 6.0000 | MEDICATED_PAD | Freq: Once | CUTANEOUS | Status: DC

## 2022-06-21 MED ORDER — HEPARIN SODIUM (PORCINE) 1000 UNIT/ML IJ SOLN
INTRAMUSCULAR | Status: DC | PRN
  Administered 2022-06-21: 5000 [IU] via INTRAVENOUS

## 2022-06-21 MED ORDER — PHENYLEPHRINE HCL-NACL 20-0.9 MG/250ML-% IV SOLN: INTRAVENOUS | Status: DC | PRN

## 2022-06-21 MED ORDER — MIDAZOLAM HCL 2 MG/2ML IJ SOLN
INTRAMUSCULAR | Status: AC
  Filled 2022-06-21: qty 2

## 2022-06-21 MED ORDER — FENTANYL CITRATE (PF) 100 MCG/2ML IJ SOLN
INTRAMUSCULAR | Status: AC
  Filled 2022-06-21: qty 2

## 2022-06-21 MED ORDER — FENTANYL CITRATE (PF) 250 MCG/5ML IJ SOLN
INTRAMUSCULAR | Status: DC | PRN
  Administered 2022-06-21 (×2): 50 ug via INTRAVENOUS

## 2022-06-21 MED ORDER — PHENYLEPHRINE 80 MCG/ML (10ML) SYRINGE FOR IV PUSH (FOR BLOOD PRESSURE SUPPORT)
PREFILLED_SYRINGE | INTRAVENOUS | Status: DC | PRN
  Administered 2022-06-21 (×2): 80 ug via INTRAVENOUS

## 2022-06-21 MED ORDER — MIDAZOLAM HCL 2 MG/2ML IJ SOLN
INTRAMUSCULAR | Status: DC | PRN
  Administered 2022-06-21: 2 mg via INTRAVENOUS

## 2022-06-21 MED ORDER — ONDANSETRON HCL 4 MG/2ML IJ SOLN
INTRAMUSCULAR | Status: DC | PRN
  Administered 2022-06-21: 4 mg via INTRAVENOUS

## 2022-06-21 MED ORDER — LACTATED RINGERS IV SOLN: INTRAVENOUS | Status: DC

## 2022-06-21 MED ORDER — ACETAMINOPHEN 500 MG PO TABS
1000.0000 mg | ORAL_TABLET | Freq: Once | ORAL | Status: AC
  Administered 2022-06-21: 1000 mg via ORAL
  Filled 2022-06-21: qty 2

## 2022-06-21 MED ORDER — ONDANSETRON HCL 4 MG/2ML IJ SOLN: 4.0000 mg | INTRAMUSCULAR | Status: DC | PRN

## 2022-06-21 MED ORDER — LABETALOL HCL 5 MG/ML IV SOLN
INTRAVENOUS | Status: DC | PRN
  Administered 2022-06-21: 10 mg via INTRAVENOUS

## 2022-06-21 MED ORDER — ORAL CARE MOUTH RINSE: 15.0000 mL | Freq: Once | OROMUCOSAL | Status: AC

## 2022-06-21 MED ORDER — IOHEXOL 300 MG/ML  SOLN
100.0000 mL | Freq: Once | INTRAMUSCULAR | Status: AC | PRN
  Administered 2022-06-21: 35 mL via INTRA_ARTERIAL

## 2022-06-21 MED ORDER — SODIUM CHLORIDE 0.9 % IV SOLN: INTRAVENOUS | Status: DC

## 2022-06-21 MED ORDER — OXYCODONE HCL 5 MG PO TABS: 5.0000 mg | ORAL_TABLET | Freq: Once | ORAL | Status: DC | PRN

## 2022-06-21 MED ORDER — LACTATED RINGERS IV SOLN: INTRAVENOUS | Status: DC | PRN

## 2022-06-21 MED ORDER — CHLORHEXIDINE GLUCONATE CLOTH 2 % EX PADS: 6.0000 | MEDICATED_PAD | Freq: Every day | CUTANEOUS | Status: DC

## 2022-06-21 MED ORDER — PANTOPRAZOLE SODIUM 20 MG PO TBEC: 20.0000 mg | DELAYED_RELEASE_TABLET | Freq: Every day | ORAL | Status: DC

## 2022-06-21 MED ORDER — SUGAMMADEX SODIUM 200 MG/2ML IV SOLN
INTRAVENOUS | Status: DC | PRN
  Administered 2022-06-21: 200 mg via INTRAVENOUS

## 2022-06-21 MED ORDER — ONDANSETRON HCL 4 MG PO TABS: 4.0000 mg | ORAL_TABLET | ORAL | Status: DC | PRN

## 2022-06-21 MED ORDER — CHLORHEXIDINE GLUCONATE 0.12 % MT SOLN
OROMUCOSAL | Status: AC
  Filled 2022-06-21: qty 15

## 2022-06-21 MED ORDER — LIDOCAINE 2% (20 MG/ML) 5 ML SYRINGE
INTRAMUSCULAR | Status: DC | PRN
  Administered 2022-06-21: 60 mg via INTRAVENOUS

## 2022-06-21 MED ORDER — DEXAMETHASONE SODIUM PHOSPHATE 10 MG/ML IJ SOLN
INTRAMUSCULAR | Status: DC | PRN
  Administered 2022-06-21: 10 mg via INTRAVENOUS

## 2022-06-21 MED ORDER — LABETALOL HCL 5 MG/ML IV SOLN: 10.0000 mg | INTRAVENOUS | Status: DC | PRN

## 2022-06-21 MED ORDER — CHLORHEXIDINE GLUCONATE 0.12 % MT SOLN
15.0000 mL | Freq: Once | OROMUCOSAL | Status: AC
  Administered 2022-06-21: 15 mL via OROMUCOSAL
  Filled 2022-06-21: qty 15

## 2022-06-21 MED ORDER — OXYCODONE HCL 5 MG/5ML PO SOLN: 5.0000 mg | Freq: Once | ORAL | Status: DC | PRN

## 2022-06-21 MED ORDER — AMISULPRIDE (ANTIEMETIC) 5 MG/2ML IV SOLN: 10.0000 mg | Freq: Once | INTRAVENOUS | Status: DC | PRN

## 2022-06-21 MED ORDER — FENTANYL CITRATE (PF) 100 MCG/2ML IJ SOLN
25.0000 ug | INTRAMUSCULAR | Status: DC | PRN
  Administered 2022-06-21: 50 ug via INTRAVENOUS

## 2022-06-21 MED ORDER — CEFAZOLIN SODIUM-DEXTROSE 2-4 GM/100ML-% IV SOLN: 2.0000 g | INTRAVENOUS | Status: DC

## 2022-06-21 MED ORDER — PROPOFOL 10 MG/ML IV BOLUS
INTRAVENOUS | Status: DC | PRN
  Administered 2022-06-21 (×2): 40 mg via INTRAVENOUS
  Administered 2022-06-21: 120 mg via INTRAVENOUS

## 2022-06-21 MED ORDER — LOSARTAN POTASSIUM 50 MG PO TABS
100.0000 mg | ORAL_TABLET | Freq: Every day | ORAL | Status: DC
  Administered 2022-06-21: 100 mg via ORAL
  Filled 2022-06-21: qty 2

## 2022-06-21 MED ORDER — MORPHINE SULFATE (PF) 2 MG/ML IV SOLN
1.0000 mg | INTRAVENOUS | Status: DC | PRN
  Administered 2022-06-21 (×2): 2 mg via INTRAVENOUS
  Filled 2022-06-21 (×2): qty 1

## 2022-06-21 MED ORDER — EZETIMIBE 10 MG PO TABS
10.0000 mg | ORAL_TABLET | Freq: Every day | ORAL | Status: DC
  Administered 2022-06-21: 10 mg via ORAL
  Filled 2022-06-21 (×2): qty 1

## 2022-06-21 MED ORDER — PANTOPRAZOLE SODIUM 40 MG IV SOLR
40.0000 mg | Freq: Every day | INTRAVENOUS | Status: DC
  Administered 2022-06-21: 40 mg via INTRAVENOUS
  Filled 2022-06-21: qty 10

## 2022-06-21 MED ORDER — ALBUTEROL SULFATE (2.5 MG/3ML) 0.083% IN NEBU: 2.5000 mg | INHALATION_SOLUTION | Freq: Four times a day (QID) | RESPIRATORY_TRACT | Status: DC | PRN

## 2022-06-21 MED ORDER — SUVOREXANT 10 MG PO TABS: 1.0000 | ORAL_TABLET | Freq: Every day | ORAL | Status: DC

## 2022-06-21 MED ORDER — ONDANSETRON 4 MG PO TBDP: 4.0000 mg | ORAL_TABLET | Freq: Three times a day (TID) | ORAL | Status: DC | PRN

## 2022-06-21 MED ORDER — CEFAZOLIN SODIUM-DEXTROSE 2-4 GM/100ML-% IV SOLN
2.0000 g | INTRAVENOUS | Status: AC
  Administered 2022-06-21: 2 g via INTRAVENOUS
  Filled 2022-06-21: qty 100

## 2022-06-21 MED ORDER — ROSUVASTATIN CALCIUM 20 MG PO TABS
20.0000 mg | ORAL_TABLET | Freq: Every day | ORAL | Status: DC
  Administered 2022-06-21: 20 mg via ORAL
  Filled 2022-06-21: qty 1

## 2022-06-21 MED ORDER — ROCURONIUM BROMIDE 10 MG/ML (PF) SYRINGE
PREFILLED_SYRINGE | INTRAVENOUS | Status: DC | PRN
  Administered 2022-06-21: 60 mg via INTRAVENOUS
  Administered 2022-06-21: 20 mg via INTRAVENOUS

## 2022-06-21 MED ORDER — HYDROCODONE-ACETAMINOPHEN 5-325 MG PO TABS
1.0000 | ORAL_TABLET | ORAL | Status: DC | PRN
  Administered 2022-06-22: 1 via ORAL
  Filled 2022-06-21: qty 1

## 2022-06-21 MED ORDER — PHENYLEPHRINE HCL-NACL 20-0.9 MG/250ML-% IV SOLN
INTRAVENOUS | Status: DC | PRN
  Administered 2022-06-21: 10 ug/min via INTRAVENOUS

## 2022-06-21 NOTE — H&P (Signed)
Chief Complaint   Aneurysm  History of Present Illness  Ashley Hodge is a 62 year old woman seen for initial consultation at the request of Dr. Roderic Palau. She is referred for evaluation of a recently discovered incidental intracranial aneurysms. Briefly, the patient noted onset of right-sided arm and leg numbness and tingling about a week ago. She presented to the emergency department where workup included CT angiogram of the head demonstrating incidental aneurysms. Her symptoms resolved spontaneously but she was referred for neurosurgical evaluation. Upon questioning, patient reports similar right-sided numbness and tingling a few times in the past, usually resolving spontaneously. Patient did have some mild headache prompting return to the emergency department a few days ago. This has since improved.  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  Of note, the patient does report a history of medically controlled hypertension. No history of diabetes or previous heart attack or stroke. Patient does have COPD. No history of liver or kidney disease. No cancer history. She is not on any blood thinners or anti-platelet agents. She smokes one pack per day. There is no known family history of intracranial aneurysms or unexplained intracranial hemorrhage, however she says her brother did have a "brain tumor";   Past Medical History   Past Medical History:  Diagnosis Date   Anxiety    Asthma    COPD (chronic obstructive pulmonary disease) (HCC)    GERD (gastroesophageal reflux disease)    High cholesterol    Hypertension    Pre-diabetes     Past Surgical History   Past Surgical History:  Procedure Laterality Date   IR ANGIO INTRA EXTRACRAN SEL INTERNAL CAROTID BILAT MOD SED  05/24/2022   IR ANGIO VERTEBRAL SEL VERTEBRAL UNI R MOD SED  05/24/2022   IR US GUIDE VASC ACCESS RIGHT  05/24/2022   TUBAL LIGATION      Social  History   Social History   Tobacco Use   Smoking status: Every Day    Packs/day: 1.50    Years: 40.00    Total pack years: 60.00    Types: Cigarettes   Smokeless tobacco: Never   Tobacco comments:    down to 2 cigarettes daily 04/23/20  Vaping Use   Vaping Use: Never used  Substance Use Topics   Alcohol use: Not Currently   Drug use: Never    Medications   Prior to Admission medications   Medication Sig Start Date End Date Taking? Authorizing Provider  acetaminophen (TYLENOL) 500 MG tablet Take 1,000 mg by mouth daily as needed (Pain).   Yes [provider]  ezetimibe (ZETIA) 10 MG tablet Take 10 mg by mouth at bedtime. 05/11/21  Yes [provider]  hydrochlorothiazide (HYDRODIURIL) 12.5 MG tablet Take 12.5 mg by mouth daily. 09/07/17  Yes [provider]  losartan (COZAAR) 100 MG tablet Take 100 mg by mouth at bedtime.   Yes [provider]  ondansetron (ZOFRAN-ODT) 4 MG disintegrating tablet Take 1 tablet (4 mg total) by mouth every 8 (eight) hours as needed for nausea or vomiting. 02/01/22  Yes Elgie Congo, MD  pantoprazole (PROTONIX) 20 MG tablet Take 20 mg by mouth 2 (two) times daily. 05/11/21  Yes [provider]  rosuvastatin (CRESTOR) 20 MG tablet Take 20 mg by mouth at bedtime. 05/11/21  Yes [provider]  albuterol (VENTOLIN HFA) 108 (90 Base) MCG/ACT inhaler Inhale 3 puffs into the lungs daily as needed for wheezing or shortness  of breath. Patient not taking: Reported on 02/01/2022 04/22/21   [provider]  cephALEXin (KEFLEX) 500 MG capsule Take 1 capsule (500 mg total) by mouth 4 (four) times daily. Patient not taking: Reported on 02/01/2022 12/14/21   Blue, Soijett A, PA-C  clindamycin (CLEOCIN) 300 MG capsule Take 300 mg by mouth every 8 (eight) hours. Patient not taking: Reported on 02/01/2022 12/23/21   [provider]  cyclobenzaprine (FLEXERIL) 10 MG tablet Take 1 tablet (10 mg total)  by mouth 2 (two) times daily as needed for muscle spasms. 03/08/22   Marcello Fennel, PA-C  cyclobenzaprine (FLEXERIL) 10 MG tablet Take 1 tablet (10 mg total) by mouth 2 (two) times daily as needed for muscle spasms. 04/30/22   Marcello Fennel, PA-C  HYDROcodone-acetaminophen (NORCO/VICODIN) 5-325 MG tablet Take 1 tablet by mouth every 4 (four) hours as needed. 02/27/22   Larene Pickett, PA-C  lidocaine (LIDODERM) 5 % Place 1 patch onto the skin daily. Remove & Discard patch within 12 hours or as directed by MD Patient not taking: Reported on 02/01/2022 12/10/21   Sponseller, Gypsy Balsam, PA-C  methocarbamol (ROBAXIN) 500 MG tablet Take 1 tablet (500 mg total) by mouth 2 (two) times daily. Patient not taking: Reported on 02/01/2022 12/20/21   Deno Etienne, DO  naproxen (NAPROSYN) 375 MG tablet Take 1 tablet (375 mg total) by mouth 2 (two) times daily. Patient not taking: Reported on 02/01/2022 01/17/22   Evlyn Courier, PA-C  ondansetron (ZOFRAN) 4 MG tablet Take 1 tablet (4 mg total) by mouth every 8 (eight) hours as needed for nausea or vomiting. Patient taking differently: Take 4 mg by mouth daily as needed for nausea or vomiting. 09/09/21   Sherwood Gambler, MD  oxyCODONE (ROXICODONE) 5 MG immediate release tablet Take 1 tablet (5 mg total) by mouth every 6 (six) hours as needed for up to 10 doses for severe pain. 02/01/22   Elgie Congo, MD  traZODone (DESYREL) 50 MG tablet Take 50 mg by mouth at bedtime. Patient not taking: Reported on 02/01/2022 12/08/21   [provider]  fluticasone (FLONASE) 50 MCG/ACT nasal spray Place 2 sprays into both nostrils daily. Patient not taking: Reported on 12/10/2019 11/04/19 01/31/20  Rosemarie Ax, MD  ipratropium-albuterol (DUONEB) 0.5-2.5 (3) MG/3ML SOLN Take 3 mLs by nebulization every 6 (six) hours as needed (Wheezing and SOB). Patient not taking: Reported on 12/25/2019 12/11/19 01/31/20  Raiford Noble Latif, DO    Allergies   Allergies   Allergen Reactions   Lisinopril Cough    Review of Systems  ROS  Neurologic Exam  Awake, alert, oriented Memory and concentration grossly intact Speech fluent, appropriate CN grossly intact Motor exam: Upper Extremities Deltoid Bicep Tricep Grip  Right 5/5 5/5 5/5 5/5  Left 5/5 5/5 5/5 5/5   Lower Extremities IP Quad PF DF EHL  Right 5/5 5/5 5/5 5/5 5/5  Left 5/5 5/5 5/5 5/5 5/5   Sensation grossly intact to LT  Impression  - 62 y.o. female with incidentally discovered LICA aneurysm requiring treatment.  Plan  - Will proceed with coil embolization of LICA terminus aneurysm  I have reviewed the indications for the procedure as well as the details of the procedure and the expected postoperative course and recovery at length with the patient in the office. We have also reviewed in detail the risks, benefits, and alternatives to the procedure. All questions were answered and Ashley Hodge provided informed consent to proceed.  Consuella Lose, MD [facility]

## 2022-06-21 NOTE — Anesthesia Procedure Notes (Signed)
Procedure Name: Intubation Date/Time: 06/21/2022 1:10 PM  Performed by: Anastasio Auerbach, CRNAPre-anesthesia Checklist: Patient identified, Emergency Drugs available, Suction available and Patient being monitored Patient Re-evaluated:Patient Re-evaluated prior to induction Oxygen Delivery Method: Circle system utilized Preoxygenation: Pre-oxygenation with 100% oxygen Induction Type: IV induction Ventilation: Mask ventilation without difficulty Laryngoscope Size: Mac and 3 Grade View: Grade I Tube type: Oral Tube size: 7.5 mm Number of attempts: 1 Airway Equipment and Method: Stylet Placement Confirmation: ETT inserted through vocal cords under direct vision, positive ETCO2 and breath sounds checked- equal and bilateral Secured at: 23 cm Tube secured with: Tape Dental Injury: Teeth and Oropharynx as per pre-operative assessment

## 2022-06-21 NOTE — Transfer of Care (Signed)
Immediate Anesthesia Transfer of Care Note  Patient: Ashley Hodge  Procedure(s) Performed: Coil Embolization of LICA Aneurysm  Patient Location: PACU  Anesthesia Type:General  Level of Consciousness: awake, alert , and oriented  Airway & Oxygen Therapy: Patient Spontanous Breathing and Patient connected to face mask oxygen  Post-op Assessment: Report given to RN and Post -op Vital signs reviewed and stable  Post vital signs: Reviewed and stable  Last Vitals:  Vitals Value Taken Time  BP    Temp    Pulse    Resp    SpO2      Last Pain:  Vitals:   06/21/22 1006  PainSc: 0-No pain         Complications: No notable events documented.

## 2022-06-21 NOTE — Anesthesia Preprocedure Evaluation (Addendum)
Anesthesia Evaluation  Patient identified by MRN, date of birth, ID band Patient awake    Reviewed: Allergy & Precautions, NPO status , Patient's Chart, lab work & pertinent test results  History of Anesthesia Complications Negative for: history of anesthetic complications  Airway Mallampati: II  TM Distance: >3 FB Neck ROM: Full    Dental no notable dental hx.    Pulmonary COPD,  COPD inhaler, Current Smoker and Patient abstained from smoking.   Pulmonary exam normal        Cardiovascular hypertension, Normal cardiovascular exam     Neuro/Psych   Anxiety     LICA aneurysm    GI/Hepatic Neg liver ROS,GERD  Medicated and Controlled,,  Endo/Other  negative endocrine ROS    Renal/GU negative Renal ROS  negative genitourinary   Musculoskeletal negative musculoskeletal ROS (+)    Abdominal   Peds  Hematology negative hematology ROS (+)   Anesthesia Other Findings Day of surgery medications reviewed with patient.  Reproductive/Obstetrics negative OB ROS                             Anesthesia Physical Anesthesia Plan  ASA: 3  Anesthesia Plan: General   Post-op Pain Management: Tylenol PO (pre-op)*   Induction: Intravenous  PONV Risk Score and Plan: 3 and Treatment may vary due to age or medical condition, Ondansetron, Dexamethasone and Midazolam  Airway Management Planned: Oral ETT  Additional Equipment: Arterial line  Intra-op Plan:   Post-operative Plan: Extubation in OR  Informed Consent: I have reviewed the patients History and Physical, chart, labs and discussed the procedure including the risks, benefits and alternatives for the proposed anesthesia with the patient or authorized representative who has indicated his/her understanding and acceptance.     Dental advisory given  Plan Discussed with: CRNA  Anesthesia Plan Comments:        Anesthesia Quick  Evaluation

## 2022-06-21 NOTE — Anesthesia Procedure Notes (Signed)
Arterial Line Insertion Start/End2/10/2022 10:15 AM, 06/21/2022 10:35 AM Performed by: Leonor Liv, CRNA, CRNA  Patient location: Pre-op. Preanesthetic checklist: patient identified, IV checked, site marked, risks and benefits discussed, surgical consent, monitors and equipment checked, pre-op evaluation, timeout performed and anesthesia consent Lidocaine 1% used for infiltration Left, radial was placed Catheter size: 20 G Hand hygiene performed  and maximum sterile barriers used  Allen's test indicative of satisfactory collateral circulation Attempts: 1 Procedure performed without using ultrasound guided technique. Following insertion, dressing applied and Biopatch. Patient tolerated the procedure well with no immediate complications.

## 2022-06-21 NOTE — Brief Op Note (Signed)
  NEUROSURGERY BRIEF OPERATIVE  NOTE   PREOP DX: Aneurysm  POSTOP DX: Same  PROCEDURE: Coil embolization of LICA aneurysm  SURGEON: Dr. Consuella Lose, MD  ANESTHESIA: GETA  APPROACH: Right trans-femoral  EBL: Minimal  SPECIMENS: None  COMPLICATIONS: None  CONDITION: Stable to recovery  FINDINGS (Full report in [facility]): 1. Successful coil embolization of LICA terminus aneurysm 2. Unchanged appearance of small Acom aneurysm   Consuella Lose, MD [facility]

## 2022-06-22 ENCOUNTER — Encounter (HOSPITAL_COMMUNITY): Payer: Self-pay | Admitting: Neurosurgery

## 2022-06-22 ENCOUNTER — Other Ambulatory Visit: Payer: Medicare HMO

## 2022-06-22 NOTE — Progress Notes (Addendum)
AVS giving and discharge teaching complete with teach back. IV removed. Pt escorted via wheel chair to her ride at the entrance.

## 2022-06-22 NOTE — Discharge Summary (Signed)
Physician Discharge Summary  Patient ID: Ashley Hodge MRN: [id number] DOB/AGE: Jun 07, 1960 62 y.o.  Admit date: 06/21/2022 Discharge date: 06/22/2022  Admission Diagnoses:  Cerebral aneurysm  Discharge Diagnoses:  Same Principal Problem:   Cerebral aneurysm   Discharged Condition: Stable  Hospital Course:  Ashley Hodge is a 62 y.o. female admitted after elective LICA aneurysm coil embolization. She was monitored in the unit and was at baseline on POD#1. She was ambulating well, tolerating diet and requested d/c home.  Treatments: Surgery - LICA aneurysm coil embolization  Discharge Exam: Blood pressure (!) 110/98, pulse 68, temperature 98.4 F (36.9 C), temperature source Oral, resp. rate 15, height 5' (1.524 m), weight 72.1 kg, SpO2 96 %. Awake, alert, oriented Speech fluent, appropriate CN grossly intact 5/5 BUE/BLE Wound c/d/i  Disposition: Discharge disposition: 01-Home or Self Care       Discharge Instructions     Call MD for:  redness, tenderness, or signs of infection (pain, swelling, redness, odor or green/yellow discharge around incision site)   Complete by: As directed    Call MD for:  temperature >100.4   Complete by: As directed    Diet - low sodium heart healthy   Complete by: As directed    Discharge instructions   Complete by: As directed    Walk at home as much as possible, at least 4 times / day   Increase activity slowly   Complete by: As directed    Lifting restrictions   Complete by: As directed    No lifting > 10 lbs   May shower / Bathe   Complete by: As directed    48 hours after surgery   May walk up steps   Complete by: As directed    No wound care   Complete by: As directed    Other Restrictions   Complete by: As directed    No bending/twisting at waist      Allergies as of 06/22/2022       Reactions   Lisinopril Cough        Medication List     TAKE these medications    Advil Dual Action 125-250 MG  Tabs Generic drug: Ibuprofen-Acetaminophen Take 1-2 tablets by mouth every 6 (six) hours as needed (pain).   albuterol 108 (90 Base) MCG/ACT inhaler Commonly known as: VENTOLIN HFA Inhale 1-2 puffs into the lungs every 6 (six) hours as needed for wheezing or shortness of breath.   Belsomra 10 MG Tabs Generic drug: Suvorexant Take 1 tablet by mouth at bedtime.   ezetimibe 10 MG tablet Commonly known as: ZETIA Take 10 mg by mouth daily.   losartan 100 MG tablet Commonly known as: COZAAR Take 100 mg by mouth at bedtime.   ondansetron 4 MG disintegrating tablet Commonly known as: ZOFRAN-ODT Take 1 tablet (4 mg total) by mouth every 8 (eight) hours as needed for nausea or vomiting.   pantoprazole 20 MG tablet Commonly known as: PROTONIX Take 20 mg by mouth at bedtime.   rosuvastatin 20 MG tablet Commonly known as: CRESTOR Take 20 mg by mouth at bedtime.        Follow-up Information     Consuella Lose, MD Follow up.   Specialty: Neurosurgery Why: Already has appointment scheduled 07/04/22 Contact information: [address] [place] [address] [phone]                 Signed: Jairo Ben 06/22/2022, 9:54 AM

## 2022-06-22 NOTE — Anesthesia Postprocedure Evaluation (Signed)
Anesthesia Post Note  Patient: Ashley Hodge  Procedure(s) Performed: Coil Embolization of LICA Aneurysm     Patient location during evaluation: PACU Anesthesia Type: General Level of consciousness: awake and alert Pain management: pain level controlled Vital Signs Assessment: post-procedure vital signs reviewed and stable Respiratory status: spontaneous breathing, nonlabored ventilation and respiratory function stable Cardiovascular status: blood pressure returned to baseline Postop Assessment: no apparent nausea or vomiting Anesthetic complications: no   No notable events documented.       Marthenia Rolling

## 2022-06-23 ENCOUNTER — Other Ambulatory Visit (HOSPITAL_COMMUNITY): Payer: Self-pay | Admitting: Neurosurgery

## 2022-06-23 ENCOUNTER — Encounter (HOSPITAL_COMMUNITY): Payer: Self-pay

## 2022-06-23 DIAGNOSIS — I671 Cerebral aneurysm, nonruptured: Secondary | ICD-10-CM

## 2022-06-23 HISTORY — PX: IR NEURO EACH ADD'L AFTER BASIC UNI LEFT (MS): IMG5373

## 2022-06-23 HISTORY — PX: IR ANGIO INTRA EXTRACRAN SEL INTERNAL CAROTID UNI L MOD SED: IMG5361

## 2022-07-31 ENCOUNTER — Other Ambulatory Visit: Payer: Self-pay

## 2022-07-31 ENCOUNTER — Emergency Department (HOSPITAL_COMMUNITY)
Admission: EM | Admit: 2022-07-31 | Discharge: 2022-07-31 | Disposition: A | Discharge status: Hospice | Payer: Medicare HMO | Attending: Emergency Medicine | Admitting: Emergency Medicine

## 2022-07-31 ENCOUNTER — Encounter (HOSPITAL_COMMUNITY): Payer: Self-pay | Admitting: *Deleted

## 2022-07-31 DIAGNOSIS — R519 Headache, unspecified: Secondary | ICD-10-CM | POA: Diagnosis present

## 2022-07-31 DIAGNOSIS — E860 Dehydration: Secondary | ICD-10-CM | POA: Diagnosis not present

## 2022-07-31 MED ORDER — METOCLOPRAMIDE HCL 5 MG/ML IJ SOLN
10.0000 mg | Freq: Once | INTRAMUSCULAR | Status: DC
  Filled 2022-07-31: qty 2

## 2022-07-31 MED ORDER — SODIUM CHLORIDE 0.9 % IV BOLUS: 1000.0000 mL | Freq: Once | INTRAVENOUS | Status: DC

## 2022-07-31 MED ORDER — DEXAMETHASONE SODIUM PHOSPHATE 4 MG/ML IJ SOLN
4.0000 mg | Freq: Once | INTRAMUSCULAR | Status: DC
  Filled 2022-07-31: qty 1

## 2022-07-31 MED ORDER — KETOROLAC TROMETHAMINE 15 MG/ML IJ SOLN
15.0000 mg | Freq: Once | INTRAMUSCULAR | Status: DC
  Filled 2022-07-31: qty 1

## 2022-07-31 NOTE — ED Triage Notes (Signed)
PT here from home c/o continued headaches since her aneurism repair in February.  Pt has taken meds prescribed by neurosurgeon with no relief.  Pt now has increased nausea and she cannot handle the pain anymore.  AO x 4.

## 2022-07-31 NOTE — ED Provider Notes (Signed)
[facility] Provider Note   CSN: [id number] Arrival date & time: 07/31/22  1523     History  Chief Complaint  Patient presents with   Headache    Ashley Hodge is a 62 y.o. female status post coil embolization of LICA aneurysm in the beginning of February 2024 who presents to the ED today complaining of right-sided parietal and frontal headache.  She states that after her procedure she was told it would be normal for her to have headaches she did have similar headaches immediately following that they had resolved until a few days ago when they returned.  She reports that she was prescribed prednisone to take at home to control her headaches but noticed that this elevated her blood pressure and thus discontinued it.  She reports headache is not relieved by any over-the-counter medications and is associated with nausea.  Her next follow-up with neurosurgery is April 1.  She denies fever, chills, neck pain, back pain, vomiting, diarrhea, cough, congestion, focal weakness, or problems with her gait.    Home Medications Prior to Admission medications   Medication Sig Start Date End Date Taking? Authorizing Provider  albuterol (VENTOLIN HFA) 108 (90 Base) MCG/ACT inhaler Inhale 1-2 puffs into the lungs every 6 (six) hours as needed for wheezing or shortness of breath.    [provider]  BELSOMRA 10 MG TABS Take 1 tablet by mouth at bedtime. 06/07/22   [provider]  ezetimibe (ZETIA) 10 MG tablet Take 10 mg by mouth daily.    [provider]  Ibuprofen-Acetaminophen (ADVIL DUAL ACTION) 125-250 MG TABS Take 1-2 tablets by mouth every 6 (six) hours as needed (pain).    [provider]  losartan (COZAAR) 100 MG tablet Take 100 mg by mouth at bedtime.    [provider]  ondansetron (ZOFRAN-ODT) 4 MG disintegrating tablet Take 1 tablet (4 mg total) by mouth every 8 (eight) hours as needed for nausea or  vomiting. 02/01/22   Elgie Congo, MD  pantoprazole (PROTONIX) 20 MG tablet Take 20 mg by mouth at bedtime. 05/11/21   [provider]  rosuvastatin (CRESTOR) 20 MG tablet Take 20 mg by mouth at bedtime. 05/11/21   [provider]  fluticasone (FLONASE) 50 MCG/ACT nasal spray Place 2 sprays into both nostrils daily. Patient not taking: Reported on 12/10/2019 11/04/19 01/31/20  Rosemarie Ax, MD  ipratropium-albuterol (DUONEB) 0.5-2.5 (3) MG/3ML SOLN Take 3 mLs by nebulization every 6 (six) hours as needed (Wheezing and SOB). Patient not taking: Reported on 12/25/2019 12/11/19 01/31/20  Raiford Noble Latif, DO      Allergies    Lisinopril    Review of Systems   Review of Systems  All other systems reviewed and are negative.   Physical Exam Updated Vital Signs BP (!) 149/78 (BP Location: Right Arm)   Pulse 96   Temp 98.3 F (36.8 C) (Oral)   Resp (!) 21   Ht 5' (1.524 m)   Wt 68 kg   SpO2 97%   BMI 29.29 kg/m  Physical Exam Vitals and nursing note reviewed.  Constitutional:      General: She is not in acute distress.    Appearance: Normal appearance. She is not ill-appearing or toxic-appearing.  HENT:     Head: Normocephalic and atraumatic.     Mouth/Throat:     Mouth: Mucous membranes are moist.  Eyes:     Extraocular Movements: Extraocular movements intact.  Right eye: No nystagmus.     Left eye: No nystagmus.     Conjunctiva/sclera: Conjunctivae normal.     Pupils: Pupils are equal, round, and reactive to light.  Cardiovascular:     Rate and Rhythm: Normal rate and regular rhythm.     Heart sounds: No murmur heard. Pulmonary:     Effort: Pulmonary effort is normal.     Breath sounds: Normal breath sounds.  Abdominal:     General: Abdomen is flat.     Palpations: Abdomen is soft.     Tenderness: There is no abdominal tenderness.  Musculoskeletal:        General: Normal range of motion.     Cervical back: Normal range of motion and neck  supple. No rigidity.     Right lower leg: No edema.     Left lower leg: No edema.  Skin:    General: Skin is warm and dry.     Capillary Refill: Capillary refill takes less than 2 seconds.  Neurological:     Mental Status: She is alert and oriented to person, place, and time.     GCS: GCS eye subscore is 4. GCS verbal subscore is 5. GCS motor subscore is 6.     Cranial Nerves: Cranial nerves 2-12 are intact. No cranial nerve deficit, dysarthria or facial asymmetry.     Sensory: Sensation is intact.     Motor: Motor function is intact. No weakness, tremor, atrophy, abnormal muscle tone or seizure activity.     Coordination: Coordination is intact.     Gait: Gait is intact.  Psychiatric:        Behavior: Behavior normal.     ED Results / Procedures / Treatments   Labs (all labs ordered are listed, but only abnormal results are displayed) Labs Reviewed - No data to display  EKG None  Radiology No results found.  Procedures Procedures    Medications Ordered in ED Medications  ketorolac (TORADOL) 15 MG/ML injection 15 mg (has no administration in time range)  metoCLOPramide (REGLAN) injection 10 mg (has no administration in time range)  dexamethasone (DECADRON) injection 4 mg (has no administration in time range)  sodium chloride 0.9 % bolus 1,000 mL (1,000 mLs Intravenous New Bag/Given 07/31/22 1640)    ED Course/ Medical Decision Making/ A&P                             Medical Decision Making  Medical Decision Making:   Ashley Hodge is a 62 y.o. female who presented to the ED today with headache detailed above.    Patient's presentation is complicated by their history of recent aneurysm embolization.  Complete initial physical exam performed, notably the patient  was in no acute distress and neurologically intact. No meningismus.  Reviewed and confirmed nursing documentation for past medical history, family history, social history.    Initial Assessment:   With  the patient's presentation of headache, emergent considerations for headache include subarachnoid hemorrhage, meningitis, temporal arteritis, glaucoma, cerebral ischemia, carotid/vertebral dissection, intracranial tumor, Venous sinus thrombosis, carbon monoxide poisoning, acute or chronic subdural hemorrhage.  Other considerations include: Migraine, Cluster headache, Hypertension, Caffeine, alcohol, or drug withdrawal, Pseudotumor cerebri, Arteriovenous malformation, Head injury, Neurocysticercosis, Post-lumbar puncture, Preeclampsia, Tension headache, Sinusitis, Cervical arthritis, Refractive error causing strain, Temporomandibular joint syndrome, Depression, Somatoform disorder (eg, somatization) Trigeminal neuralgia, Glossopharyngeal neuralgia.  This is most consistent with an acute complicated illness  Initial Plan:  Objective evaluation as reviewed  Patient stated that she did not desire further workup of her headache today and would like to proceed with medication management only On reexamination, patient reports that she went to the restroom while waiting in the ED and noted that her urine was a dark yellow and she is mostly concerned for dehydration now. She reports that she has not been drinking well as she is currently living in a hotel and cannot afford bottled water and does not want to drink hotel water.  She declined medications ordered for headache treatment in favor of IV fluid hydration.  She states she will see if the fluids help her headache in addition to getting something to eat while waiting in the ED before taking the medications ordered.  Initial Study Results:   Laboratory  Patient declined  Radiology:  Patient declined   Final Assessment and Plan:   This is a 62 year old female post recent LICA embolization in February 2024 who presents to the ED complaining of a right-sided headache.  Patient reports that she was told it would be normal for her to have headaches following  this procedure but they had initially resolved until a few days ago when she started having headache again.  On exam, patient is neurologically intact with no focal deficits.  Slightly hypertensive but vital signs are stable.  She reports that her next follow-up with neurosurgery is April 1.  In discussion with patient, she does not desire further workup in the ED today.  She would like to proceed only with medication management of her headache.  With her reassuring exam and vital signs, I do believe this is reasonable.  Medications ordered as above as well as IV fluids for headache treatment.  On reexamination, patient reports that she went to the bathroom and noted very dark yellow urine and believes that she is only dehydrated.  She declined other medications in favor of IV hydration.  Following IV bolus, patient reports good relief of her headache.  She again declines medications as ordered.  She states that she desires to be discharged home.  Patient reported financial constraints and this provided multiple resources with discharge instructions.  Strict ED return precautions given, all questions answered, and patient stable for discharge.   Clinical Impression:  1. Acute nonintractable headache, unspecified headache type   2. Dehydration      Discharge           Final Clinical Impression(s) / ED Diagnoses Final diagnoses:  Acute nonintractable headache, unspecified headache type  Dehydration    Rx / DC Orders ED Discharge Orders          Ordered    Diet Carb Modified        07/31/22 1651              Suzzette Righter, PA-C 07/31/22 1824    Godfrey Pick, MD 08/02/22 [phone]

## 2022-07-31 NOTE — ED Notes (Signed)
PIV established and fluids infusing pt states that she wants to hold pain medication because she thinks she just needs fluids

## 2022-07-31 NOTE — ED Notes (Signed)
Food tray ordered

## 2022-07-31 NOTE — Discharge Instructions (Addendum)
Thank you for letting us take care of you today.  We gave you IV fluids.  You reported that you had good relief of your headache following this.  Please be sure to stay well-hydrated at home.  You may also benefit from electrolyte replacement solutions such as Gatorade or Powerade. It is very important to keep your follow-up with neurosurgery that is scheduled on April 1.  For any new or worsening symptoms, please return to the nearest emergency department for reevaluation.  You expressed that you had some difficulty obtaining bottled water and other financial constraints.  With this, I provided a couple of resource guides for information regarding programs that may be able to assist you.

## 2022-08-06 ENCOUNTER — Emergency Department (HOSPITAL_COMMUNITY)
Admission: EM | Admit: 2022-08-06 | Discharge: 2022-08-06 | Disposition: A | Discharge status: Home / Self Care | Payer: Medicare HMO | Attending: Emergency Medicine | Admitting: Emergency Medicine

## 2022-08-06 ENCOUNTER — Emergency Department (HOSPITAL_COMMUNITY): Payer: Medicare HMO

## 2022-08-06 ENCOUNTER — Other Ambulatory Visit: Payer: Self-pay

## 2022-08-06 DIAGNOSIS — G459 Transient cerebral ischemic attack, unspecified: Secondary | ICD-10-CM | POA: Insufficient documentation

## 2022-08-06 DIAGNOSIS — R202 Paresthesia of skin: Secondary | ICD-10-CM | POA: Diagnosis present

## 2022-08-06 DIAGNOSIS — R519 Headache, unspecified: Secondary | ICD-10-CM | POA: Diagnosis not present

## 2022-08-06 DIAGNOSIS — R200 Anesthesia of skin: Secondary | ICD-10-CM

## 2022-08-06 LAB — CBC
HCT: 43.9 % (ref 36.0–46.0)
Hemoglobin: 13.9 g/dL (ref 12.0–15.0)
MCH: 30.7 pg (ref 26.0–34.0)
MCHC: 31.7 g/dL (ref 30.0–36.0)
MCV: 96.9 fL (ref 80.0–100.0)
Platelets: 303 10*3/uL (ref 150–400)
RBC: 4.53 MIL/uL (ref 3.87–5.11)
RDW: 11.6 % (ref 11.5–15.5)
WBC: 12.4 10*3/uL — ABNORMAL HIGH (ref 4.0–10.5)
nRBC: 0 % (ref 0.0–0.2)

## 2022-08-06 LAB — I-STAT CHEM 8, ED
BUN: 12 mg/dL (ref 8–23)
Calcium, Ion: 1.15 mmol/L (ref 1.15–1.40)
Chloride: 108 mmol/L (ref 98–111)
Creatinine, Ser: 0.9 mg/dL (ref 0.44–1.00)
Glucose, Bld: 109 mg/dL — ABNORMAL HIGH (ref 70–99)
HCT: 42 % (ref 36.0–46.0)
Hemoglobin: 14.3 g/dL (ref 12.0–15.0)
Potassium: 4.2 mmol/L (ref 3.5–5.1)
Sodium: 143 mmol/L (ref 135–145)
TCO2: 25 mmol/L (ref 22–32)

## 2022-08-06 LAB — COMPREHENSIVE METABOLIC PANEL
ALT: 18 U/L (ref 0–44)
AST: 20 U/L (ref 15–41)
Albumin: 3.6 g/dL (ref 3.5–5.0)
Alkaline Phosphatase: 76 U/L (ref 38–126)
Anion gap: 9 (ref 5–15)
BUN: 13 mg/dL (ref 8–23)
CO2: 24 mmol/L (ref 22–32)
Calcium: 8.9 mg/dL (ref 8.9–10.3)
Chloride: 107 mmol/L (ref 98–111)
Creatinine, Ser: 0.89 mg/dL (ref 0.44–1.00)
GFR, Estimated: >60 mL/min (ref >60)
Glucose, Bld: 114 mg/dL — ABNORMAL HIGH (ref 70–99)
Potassium: 4.3 mmol/L (ref 3.5–5.1)
Sodium: 140 mmol/L (ref 135–145)
Total Bilirubin: 0.4 mg/dL (ref 0.3–1.2)
Total Protein: 7.3 g/dL (ref 6.5–8.1)

## 2022-08-06 LAB — CBG MONITORING, ED: Glucose-Capillary: 101 mg/dL — ABNORMAL HIGH (ref 70–99)

## 2022-08-06 LAB — DIFFERENTIAL
Abs Immature Granulocytes: 0.03 10*3/uL (ref 0.00–0.07)
Basophils Absolute: 0.1 10*3/uL (ref 0.0–0.1)
Basophils Relative: 1 %
Eosinophils Absolute: 0.2 10*3/uL (ref 0.0–0.5)
Eosinophils Relative: 2 %
Immature Granulocytes: 0 %
Lymphocytes Relative: 17 %
Lymphs Abs: 2.2 10*3/uL (ref 0.7–4.0)
Monocytes Absolute: 1 10*3/uL (ref 0.1–1.0)
Monocytes Relative: 8 %
Neutro Abs: 9 10*3/uL — ABNORMAL HIGH (ref 1.7–7.7)
Neutrophils Relative %: 72 %

## 2022-08-06 LAB — APTT: aPTT: 27 seconds (ref 24–36)

## 2022-08-06 LAB — PROTIME-INR
INR: 1 (ref 0.8–1.2)
Prothrombin Time: 12.6 seconds (ref 11.4–15.2)

## 2022-08-06 NOTE — Discharge Instructions (Signed)
1.  At this time your symptoms have resolved.  If you get any recurrence of numbness of your arms and legs, bad headache different from usual, visual changes, new weakness different from usual or other concerning changes return to Emergency Department immediately for recheck. 2.  Call your neurologist on the next business day to schedule follow-up with soon as possible. 3.  You have been given educational information about strokes and mini strokes, reviewed this and return immediately if you have symptoms as described in your discharge structures.

## 2022-08-06 NOTE — ED Notes (Signed)
Pt provided discharge instructions and prescription information. Pt was given the opportunity to ask questions and questions were answered.   

## 2022-08-06 NOTE — ED Notes (Signed)
Pt's LKW was 02:00 when she went to sleep pt noted the L sided numbness on waking. Pt states the symptoms lasted 2 hours this AM and then resolved. Pt states the symptoms have been intermittent this AM and are not present at this time.

## 2022-08-06 NOTE — ED Provider Notes (Signed)
[facility] EMERGENCY DEPARTMENT AT [facility] Provider Note   CSN: [id number] Arrival date & time: 08/06/22  1405     History  Chief Complaint  Patient presents with   Numbness    Ashley Hodge is a 62 y.o. female.  HPI H/O elective LICA aneurysm coil embolization by Dr. Kathyrn Sheriff [id number].  Patient reports at baseline she uses a scooter chair for mobility.  She reports she can get up and walk around in her home to do small activities but she cannot walk any kind of long distances because she gets very weak in the legs.  She reports this been a very longstanding condition but does not have a specific diagnosis.  Patient reports that this morning when she awakened about 1030 she noted she had some tingling and numbness in her hand and also of the lower leg.  She reports that she did not have any weakness that seem new or different.  She could do all the same activities at baseline.  Reports she has had problems with chronic waxing and waning headaches that have been diagnosed as migraines despite her aneurysm coiling.  Reports she did not have a headache or any other associated symptoms when she awakened and had the numbness and tingling sensation in her hand and her lower leg.  At first she thought maybe she had slept wrong but it persisted and finally she determined she would come to the emergency department.  She used her motorized chair to drive here and on her way, the symptoms resolved.  She reports she later then developed some frontal headache but it was a headache pretty typical for was diagnosed as her migraines.  It was not in association with the neurologic symptoms.  Not worse or different from baseline.  No associated visual changes.  No fevers no chills and not otherwise feeling sick.    Home Medications Prior to Admission medications   Medication Sig Start Date End Date Taking? Authorizing Provider  albuterol (VENTOLIN HFA) 108 (90 Base) MCG/ACT inhaler Inhale  1-2 puffs into the lungs every 6 (six) hours as needed for wheezing or shortness of breath.    [provider]  BELSOMRA 10 MG TABS Take 1 tablet by mouth at bedtime. 06/07/22   [provider]  ezetimibe (ZETIA) 10 MG tablet Take 10 mg by mouth daily.    [provider]  Ibuprofen-Acetaminophen (ADVIL DUAL ACTION) 125-250 MG TABS Take 1-2 tablets by mouth every 6 (six) hours as needed (pain).    [provider]  losartan (COZAAR) 100 MG tablet Take 100 mg by mouth at bedtime.    [provider]  ondansetron (ZOFRAN-ODT) 4 MG disintegrating tablet Take 1 tablet (4 mg total) by mouth every 8 (eight) hours as needed for nausea or vomiting. 02/01/22   Elgie Congo, MD  pantoprazole (PROTONIX) 20 MG tablet Take 20 mg by mouth at bedtime. 05/11/21   [provider]  rosuvastatin (CRESTOR) 20 MG tablet Take 20 mg by mouth at bedtime. 05/11/21   [provider]  fluticasone (FLONASE) 50 MCG/ACT nasal spray Place 2 sprays into both nostrils daily. Patient not taking: Reported on 12/10/2019 11/04/19 01/31/20  Rosemarie Ax, MD  ipratropium-albuterol (DUONEB) 0.5-2.5 (3) MG/3ML SOLN Take 3 mLs by nebulization every 6 (six) hours as needed (Wheezing and SOB). Patient not taking: Reported on 12/25/2019 12/11/19 01/31/20  Raiford Noble Latif, DO      Allergies    Lisinopril  Review of Systems   Review of Systems  Physical Exam Updated Vital Signs BP 132/71   Pulse 84   Temp 98 F (36.7 C) (Oral)   Resp 18   Ht 5' (1.524 m)   Wt 68 kg   SpO2 91%   BMI 29.28 kg/m  Physical Exam Constitutional:      Comments: Alert nontoxic no acute distress.  HENT:     Head: Normocephalic and atraumatic.     Mouth/Throat:     Mouth: Mucous membranes are moist.     Pharynx: Oropharynx is clear.  Eyes:     Extraocular Movements: Extraocular movements intact.     Conjunctiva/sclera: Conjunctivae normal.     Pupils: Pupils are equal, round,  and reactive to light.  Cardiovascular:     Rate and Rhythm: Normal rate and regular rhythm.  Pulmonary:     Effort: Pulmonary effort is normal.     Breath sounds: Normal breath sounds.  Abdominal:     General: There is no distension.     Palpations: Abdomen is soft.     Tenderness: There is no abdominal tenderness. There is no guarding.  Musculoskeletal:     Comments: No significant peripheral edema.  The calves are soft and pliable.  Feet and lower legs are in good condition.  Patient does not have significant atrophy of the lower extremities.  Skin:    General: Skin is warm and dry.  Neurological:     Comments: Cranial nerves II through XII intact.  Motor strength 5\5 bilateral upper extremities.  Sensation intact to light touch and symmetric.  Lower extremities, patient can elevate both of them off of the bed and hold against resistance.  Patient has 5\5 dorsiflexion plantarflexion.  Symmetric.  Denies difference to light touch to bilateral lower extremities.  Psychiatric:        Mood and Affect: Mood normal.     ED Results / Procedures / Treatments   Labs (all labs ordered are listed, but only abnormal results are displayed) Labs Reviewed  CBC - Abnormal; Notable for the following components:      Result Value   WBC 12.4 (*)    All other components within normal limits  DIFFERENTIAL - Abnormal; Notable for the following components:   Neutro Abs 9.0 (*)    All other components within normal limits  COMPREHENSIVE METABOLIC PANEL - Abnormal; Notable for the following components:   Glucose, Bld 114 (*)    All other components within normal limits  I-STAT CHEM 8, ED - Abnormal; Notable for the following components:   Glucose, Bld 109 (*)    All other components within normal limits  CBG MONITORING, ED - Abnormal; Notable for the following components:   Glucose-Capillary 101 (*)    All other components within normal limits  PROTIME-INR  APTT  ETHANOL    EKG EKG  Interpretation  Date/Time:  Saturday August 06 2022 14:47:32 EDT Ventricular Rate:  89 PR Interval:  130 QRS Duration: 93 QT Interval:  365 QTC Calculation: 445 R Axis:   43 Text Interpretation: Sinus rhythm Low voltage, precordial leads no change from previous Confirmed by Charlesetta Shanks [phone]) on 08/06/2022 5:25:33 PM  Radiology CT HEAD WO CONTRAST  Result Date: 08/06/2022 CLINICAL DATA:  Recent left internal carotid aneurysm coiling, headache, TIA symptoms EXAM: CT HEAD WITHOUT CONTRAST TECHNIQUE: Contiguous axial images were obtained from the base of the skull through the vertex without intravenous contrast. RADIATION DOSE REDUCTION: This exam was performed  according to the departmental dose-optimization program which includes automated exposure control, adjustment of the mA and/or kV according to patient size and/or use of iterative reconstruction technique. COMPARISON:  04/23/2022, 06/05/2022 FINDINGS: Brain: Aneurysm coil at the left ICA terminus with resulting streak artifact. No evidence of acute infarct or hemorrhage. Lateral ventricles and midline structures are unremarkable. There are no acute extra-axial fluid collections. No mass effect. Vascular: Stable atherosclerosis of the internal carotid arteries. Aneurysm coil at the left ICA terminus as above. No hyperdense vessel. Skull: Normal. Negative for fracture or focal lesion. Sinuses/Orbits: No acute finding. Other: None. IMPRESSION: 1. Interval coiling of a left ICA terminus aneurysm. 2. No evidence of acute infarct or hemorrhage. Electronically Signed   By: Randa Ngo M.D.   On: 08/06/2022 15:53    Procedures Procedures    Medications Ordered in ED Medications - No data to display  ED Course/ Medical Decision Making/ A&P                             Medical Decision Making Amount and/or Complexity of Data Reviewed Labs: ordered. Radiology: ordered.   Patient presents with episode of numbness to the left hand and  lower portion of lower extremity.  She awakened with this symptom but then it spontaneously resolved.  There was no associated weakness no visual changes.  Patient reports chronic headaches and has history of an aneurysm coiling.  She did not have a headache in conjunction with the symptoms.  She later reported some frontal headache but then that subsequently resolved.  At this time we will proceed with basic lab work and CT head.  Patient is at baseline now and asymptomatic no indication for code stroke.  CT head no acute findings.  Basic lab work within normal limits.  Metabolic panel within normal limits.  White count 12.5, no anemia.  Patient has remained asymptomatic.  I reviewed EMR from prior notes and Dr. Cleotilde Neer aneurysm coiling.  Patient reports she has remained asymptomatic and feels ready for discharge.  Patient has had normal blood pressures no significant hypertension or other finding of metabolic derangement.  At this time I do feel she is stable for discharge.  I have recommended close follow-up with Dr. Kathyrn Sheriff and return precautions reviewed.  Included in discharge instructions are educational information regarding TAA a and return precautions.        Final Clinical Impression(s) / ED Diagnoses Final diagnoses:  Numbness    Rx / DC Orders ED Discharge Orders     None         Charlesetta Shanks, MD 08/06/22 1728

## 2022-08-06 NOTE — ED Triage Notes (Signed)
Pt reports left arm and left leg numbness and tingling that started at 1100 this morning.

## 2022-08-11 ENCOUNTER — Ambulatory Visit: Payer: Medicaid Other | Admitting: Podiatry

## 2022-08-22 ENCOUNTER — Ambulatory Visit: Payer: Medicaid Other | Admitting: Podiatry

## 2022-08-29 ENCOUNTER — Encounter: Payer: Self-pay | Admitting: *Deleted

## 2022-08-31 ENCOUNTER — Encounter: Payer: Self-pay | Admitting: Podiatry

## 2022-08-31 ENCOUNTER — Ambulatory Visit (INDEPENDENT_AMBULATORY_CARE_PROVIDER_SITE_OTHER): Payer: Medicare HMO | Admitting: Podiatry

## 2022-08-31 DIAGNOSIS — M79674 Pain in right toe(s): Secondary | ICD-10-CM | POA: Diagnosis not present

## 2022-08-31 DIAGNOSIS — B351 Tinea unguium: Secondary | ICD-10-CM

## 2022-08-31 DIAGNOSIS — M79675 Pain in left toe(s): Secondary | ICD-10-CM

## 2022-08-31 DIAGNOSIS — L600 Ingrowing nail: Secondary | ICD-10-CM

## 2022-08-31 NOTE — Progress Notes (Signed)
Subjective:   Patient ID: Ashley Hodge, female   DOB: 62 y.o.   MRN: [id number]   HPI Patient presents with severe nail disease 1-5 both feet that are very elongated incurvated with the nails being sore especially the big toenails.  Patient is noted to be in a wheelchair and has had history of cerebral aneurysm.  Patient does smoke a pack of cigarettes per day and is not active   Review of Systems  All other systems reviewed and are negative.       Objective:  Physical Exam Vitals and nursing note reviewed.  Constitutional:      Appearance: She is well-developed.  Pulmonary:     Effort: Pulmonary effort is normal.  Musculoskeletal:        General: Normal range of motion.  Skin:    General: Skin is warm.  Neurological:     Mental Status: She is alert.     Vascular status mildly diminished neurological mildly diminished with patient found to have severely elongated thickened nailbeds 1-5 both feet all painful with the hallux nails being worse.  Good digital perfusion well-oriented x 3     Assessment:  Chronic mycotic nail infection with pain 1-5 both feet     Plan:  H&P done debrided nailbeds 1-5 both feet no angiogenic bleeding reappoint routine care

## 2022-08-31 NOTE — Patient Instructions (Signed)

## 2022-09-02 ENCOUNTER — Other Ambulatory Visit: Payer: Self-pay

## 2022-09-02 ENCOUNTER — Emergency Department (HOSPITAL_COMMUNITY)
Admission: EM | Admit: 2022-09-02 | Discharge: 2022-09-02 | Disposition: A | Discharge status: Home / Self Care | Payer: Medicare HMO | Attending: Emergency Medicine | Admitting: Emergency Medicine

## 2022-09-02 ENCOUNTER — Emergency Department (HOSPITAL_COMMUNITY): Payer: Medicare HMO

## 2022-09-02 DIAGNOSIS — I729 Aneurysm of unspecified site: Secondary | ICD-10-CM | POA: Insufficient documentation

## 2022-09-02 DIAGNOSIS — I10 Essential (primary) hypertension: Secondary | ICD-10-CM | POA: Insufficient documentation

## 2022-09-02 DIAGNOSIS — Z79899 Other long term (current) drug therapy: Secondary | ICD-10-CM | POA: Insufficient documentation

## 2022-09-02 DIAGNOSIS — R519 Headache, unspecified: Secondary | ICD-10-CM | POA: Diagnosis present

## 2022-09-02 MED ORDER — DIPHENHYDRAMINE HCL 25 MG PO CAPS
25.0000 mg | ORAL_CAPSULE | Freq: Once | ORAL | Status: AC
  Administered 2022-09-02: 25 mg via ORAL
  Filled 2022-09-02: qty 1

## 2022-09-02 MED ORDER — PROCHLORPERAZINE EDISYLATE 10 MG/2ML IJ SOLN: 10.0000 mg | Freq: Once | INTRAMUSCULAR | Status: DC

## 2022-09-02 MED ORDER — DIPHENHYDRAMINE HCL 50 MG/ML IJ SOLN: 25.0000 mg | Freq: Once | INTRAMUSCULAR | Status: DC

## 2022-09-02 MED ORDER — ACETAMINOPHEN 325 MG PO TABS
650.0000 mg | ORAL_TABLET | Freq: Once | ORAL | Status: AC
  Administered 2022-09-02: 650 mg via ORAL
  Filled 2022-09-02: qty 2

## 2022-09-02 NOTE — ED Provider Notes (Signed)
[place] EMERGENCY DEPARTMENT AT [facility] Provider Note   CSN: [id number] Arrival date & time: 09/02/22  1948     History {Add pertinent medical, surgical, social history, OB history to HPI:1} Chief Complaint  Patient presents with   Headache    Ashley Hodge is a 62 y.o. female.  HPI    62 year old female comes in with chief complaint of headache.  Home Medications Prior to Admission medications   Medication Sig Start Date End Date Taking? Authorizing Provider  albuterol (VENTOLIN HFA) 108 (90 Base) MCG/ACT inhaler Inhale 1-2 puffs into the lungs every 6 (six) hours as needed for wheezing or shortness of breath.    [provider]  BELSOMRA 10 MG TABS Take 1 tablet by mouth at bedtime. 06/07/22   [provider]  ezetimibe (ZETIA) 10 MG tablet Take 10 mg by mouth daily.    [provider]  Ibuprofen-Acetaminophen (ADVIL DUAL ACTION) 125-250 MG TABS Take 1-2 tablets by mouth every 6 (six) hours as needed (pain).    [provider]  losartan (COZAAR) 100 MG tablet Take 100 mg by mouth at bedtime.    [provider]  ondansetron (ZOFRAN-ODT) 4 MG disintegrating tablet Take 1 tablet (4 mg total) by mouth every 8 (eight) hours as needed for nausea or vomiting. 02/01/22   Mardene Sayer, MD  pantoprazole (PROTONIX) 20 MG tablet Take 20 mg by mouth at bedtime. 05/11/21   [provider]  rosuvastatin (CRESTOR) 20 MG tablet Take 20 mg by mouth at bedtime. 05/11/21   [provider]  fluticasone (FLONASE) 50 MCG/ACT nasal spray Place 2 sprays into both nostrils daily. Patient not taking: Reported on 12/10/2019 11/04/19 01/31/20  Myra Rude, MD  ipratropium-albuterol (DUONEB) 0.5-2.5 (3) MG/3ML SOLN Take 3 mLs by nebulization every 6 (six) hours as needed (Wheezing and SOB). Patient not taking: Reported on 12/25/2019 12/11/19 01/31/20  Marguerita Merles Latif, DO      Allergies    Lisinopril    Review of  Systems   Review of Systems  Physical Exam Updated Vital Signs BP (!) 166/79 (BP Location: Left Arm)   Pulse 96   Temp 98.3 F (36.8 C) (Oral)   Resp 18   Ht 5' (1.524 m)   Wt 72.6 kg   SpO2 97%   BMI 31.25 kg/m  Physical Exam  ED Results / Procedures / Treatments   Labs (all labs ordered are listed, but only abnormal results are displayed) Labs Reviewed - No data to display  EKG None  Radiology CT Head Wo Contrast  Result Date: 09/02/2022 CLINICAL DATA:  Headache EXAM: CT HEAD WITHOUT CONTRAST TECHNIQUE: Contiguous axial images were obtained from the base of the skull through the vertex without intravenous contrast. RADIATION DOSE REDUCTION: This exam was performed according to the departmental dose-optimization program which includes automated exposure control, adjustment of the mA and/or kV according to patient size and/or use of iterative reconstruction technique. COMPARISON:  08/06/2022 FINDINGS: Brain: There is no mass, hemorrhage or extra-axial collection. The size and configuration of the ventricles and extra-axial CSF spaces are normal. The brain parenchyma is normal, without acute or chronic infarction. Vascular: No abnormal hyperdensity of the major intracranial arteries or dural venous sinuses. No intracranial atherosclerosis. Unchanged position of coil mass near the left carotid terminus. Skull: The visualized skull base, calvarium and extracranial soft tissues are normal. Sinuses/Orbits: No fluid levels or advanced mucosal thickening of the visualized paranasal sinuses. No mastoid or middle  ear effusion. The orbits are normal. IMPRESSION: 1. No acute intracranial abnormality. 2. Unchanged position of coil mass near the left carotid terminus. Electronically Signed   By: Deatra Robinson M.D.   On: 09/02/2022 22:10    Procedures Procedures  {Document cardiac monitor, telemetry assessment procedure when appropriate:1}  Medications Ordered in ED Medications  acetaminophen  (TYLENOL) tablet 650 mg (650 mg Oral Given 09/02/22 2140)  diphenhydrAMINE (BENADRYL) capsule 25 mg (25 mg Oral Given 09/02/22 2141)    ED Course/ Medical Decision Making/ A&P   {   Click here for ABCD2, HEART and other calculatorsREFRESH Note before signing :1}                          Medical Decision Making Amount and/or Complexity of Data Reviewed Radiology: ordered.  Risk OTC drugs.   ***  {Document critical care time when appropriate:1} {Document review of labs and clinical decision tools ie heart score, Chads2Vasc2 etc:1}  {Document your independent review of radiology images, and any outside records:1} {Document your discussion with family members, caretakers, and with consultants:1} {Document social determinants of health affecting pt's care:1} {Document your decision making why or why not admission, treatments were needed:1} Final Clinical Impression(s) / ED Diagnoses Final diagnoses:  Aneurysm  Bad headache    Rx / DC Orders ED Discharge Orders     None

## 2022-09-02 NOTE — Discharge Instructions (Signed)
Take Tylenol every 6 hours for pain control. Please return to the ER if the headache gets severe and in not improving, you have associated new one sided numbness, tingling, weakness or confusion, seizures, poor balance or poor vision.

## 2022-09-02 NOTE — ED Triage Notes (Signed)
Patient coming to ED for headache.  Reports symptoms started last night.  Hx of two aneurysms.  No reports of numbness or tingling.  No recent falls, visual changes, or nausea/vomiting.

## 2022-09-04 ENCOUNTER — Emergency Department (HOSPITAL_COMMUNITY): Payer: Medicare HMO

## 2022-09-04 ENCOUNTER — Other Ambulatory Visit: Payer: Self-pay

## 2022-09-04 ENCOUNTER — Emergency Department (HOSPITAL_COMMUNITY)
Admission: EM | Admit: 2022-09-04 | Discharge: 2022-09-04 | Disposition: A | Discharge status: Home / Self Care | Payer: Medicare HMO | Attending: Emergency Medicine | Admitting: Emergency Medicine

## 2022-09-04 ENCOUNTER — Encounter (HOSPITAL_COMMUNITY): Payer: Self-pay | Admitting: *Deleted

## 2022-09-04 DIAGNOSIS — W19XXXA Unspecified fall, initial encounter: Secondary | ICD-10-CM | POA: Diagnosis not present

## 2022-09-04 DIAGNOSIS — I253 Aneurysm of heart: Secondary | ICD-10-CM | POA: Diagnosis not present

## 2022-09-04 DIAGNOSIS — R519 Headache, unspecified: Secondary | ICD-10-CM | POA: Insufficient documentation

## 2022-09-04 DIAGNOSIS — Z79899 Other long term (current) drug therapy: Secondary | ICD-10-CM | POA: Insufficient documentation

## 2022-09-04 DIAGNOSIS — I729 Aneurysm of unspecified site: Secondary | ICD-10-CM

## 2022-09-04 DIAGNOSIS — I10 Essential (primary) hypertension: Secondary | ICD-10-CM | POA: Diagnosis not present

## 2022-09-04 DIAGNOSIS — M25561 Pain in right knee: Secondary | ICD-10-CM | POA: Diagnosis present

## 2022-09-04 MED ORDER — HYDROCODONE-ACETAMINOPHEN 5-325 MG PO TABS
1.0000 | ORAL_TABLET | Freq: Once | ORAL | Status: AC
  Administered 2022-09-04: 1 via ORAL
  Filled 2022-09-04: qty 1

## 2022-09-04 MED ORDER — ONDANSETRON 4 MG PO TBDP
8.0000 mg | ORAL_TABLET | Freq: Once | ORAL | Status: AC
  Administered 2022-09-04: 8 mg via ORAL
  Filled 2022-09-04: qty 2

## 2022-09-04 NOTE — ED Provider Notes (Signed)
[place] EMERGENCY DEPARTMENT AT [facility] Provider Note   CSN: [id number] Arrival date & time: 09/04/22  4098     History  Chief Complaint  Patient presents with   fall/knee pain    Ashley Hodge is a 62 y.o. female.  62 year old female with past medical history significant for headaches, cerebral aneurysm status post coiling, hypertension who presents today for evaluation of a fall that occurred about 30 minutes prior to arrival.  She states she is primarily wheelchair-bound due to some ligament injury in lower extremities.  However she states she can walk short distances and her providers encouraged her to.  She states this morning she was walking her dog.  She was going down a ramp and her dog yanked on the leash causing her to fall.  She states it was rainy and wet so she was unable to maintain her balance.  She fell onto her right knee, chest, and states she possibly struck her face as well.  Denies head injury otherwise.  No loss of consciousness.  No bruising to her face.  Not on anticoagulation.  Currently denies headaches, vision change.  The history is provided by the patient and medical records. No language interpreter was used.       Home Medications Prior to Admission medications   Medication Sig Start Date End Date Taking? Authorizing Provider  albuterol (VENTOLIN HFA) 108 (90 Base) MCG/ACT inhaler Inhale 1-2 puffs into the lungs every 6 (six) hours as needed for wheezing or shortness of breath.    [provider]  BELSOMRA 10 MG TABS Take 1 tablet by mouth at bedtime. 06/07/22   [provider]  ezetimibe (ZETIA) 10 MG tablet Take 10 mg by mouth daily.    [provider]  Ibuprofen-Acetaminophen (ADVIL DUAL ACTION) 125-250 MG TABS Take 1-2 tablets by mouth every 6 (six) hours as needed (pain).    [provider]  losartan (COZAAR) 100 MG tablet Take 100 mg by mouth at bedtime.    [provider]  ondansetron  (ZOFRAN-ODT) 4 MG disintegrating tablet Take 1 tablet (4 mg total) by mouth every 8 (eight) hours as needed for nausea or vomiting. 02/01/22   Mardene Sayer, MD  pantoprazole (PROTONIX) 20 MG tablet Take 20 mg by mouth at bedtime. 05/11/21   [provider]  rosuvastatin (CRESTOR) 20 MG tablet Take 20 mg by mouth at bedtime. 05/11/21   [provider]  fluticasone (FLONASE) 50 MCG/ACT nasal spray Place 2 sprays into both nostrils daily. Patient not taking: Reported on 12/10/2019 11/04/19 01/31/20  Myra Rude, MD  ipratropium-albuterol (DUONEB) 0.5-2.5 (3) MG/3ML SOLN Take 3 mLs by nebulization every 6 (six) hours as needed (Wheezing and SOB). Patient not taking: Reported on 12/25/2019 12/11/19 01/31/20  Marguerita Merles Latif, DO      Allergies    Lisinopril    Review of Systems   Review of Systems  Constitutional:  Negative for fever.  Musculoskeletal:  Positive for arthralgias.  Neurological:  Negative for syncope and headaches.  All other systems reviewed and are negative.   Physical Exam Updated Vital Signs BP (!) 182/86   Pulse (!) 102   Temp 98 F (36.7 C)   Resp 16   Ht 5' (1.524 m)   Wt 72.6 kg   SpO2 98%   BMI 31.26 kg/m  Physical Exam Vitals and nursing note reviewed.  Constitutional:      General: She is not in acute distress.  Appearance: Normal appearance. She is not ill-appearing.  HENT:     Head: Normocephalic and atraumatic.     Nose: Nose normal.  Eyes:     Conjunctiva/sclera: Conjunctivae normal.  Cardiovascular:     Rate and Rhythm: Normal rate and regular rhythm.     Pulses: Normal pulses.  Pulmonary:     Effort: Pulmonary effort is normal. No respiratory distress.     Breath sounds: No wheezing.  Abdominal:     General: There is no distension.     Palpations: Abdomen is soft.     Tenderness: There is no abdominal tenderness. There is no guarding.  Musculoskeletal:        General: No deformity.     Comments: Range of  motion of bilateral lower extremities.  Tenderness to palpation present over the right knee.  Right ankle, right hip without tenderness to palpation.  2+ DP pulse present bilaterally.  Skin:    Findings: No rash.  Neurological:     Mental Status: She is alert.     ED Results / Procedures / Treatments   Labs (all labs ordered are listed, but only abnormal results are displayed) Labs Reviewed - No data to display  EKG None  Radiology DG Knee Complete 4 Views Right  Result Date: 09/04/2022 CLINICAL DATA:  Fall EXAM: RIGHT KNEE - COMPLETE 4+ VIEW COMPARISON:  None Available. FINDINGS: No evidence of fracture, dislocation, or joint effusion. No evidence of arthropathy or other focal bone abnormality. Scattered vascular calcifications. No focal soft tissue swelling. IMPRESSION: Negative. Electronically Signed   By: Duanne Guess D.O.   On: 09/04/2022 10:57   CT Head Wo Contrast  Result Date: 09/02/2022 CLINICAL DATA:  Headache EXAM: CT HEAD WITHOUT CONTRAST TECHNIQUE: Contiguous axial images were obtained from the base of the skull through the vertex without intravenous contrast. RADIATION DOSE REDUCTION: This exam was performed according to the departmental dose-optimization program which includes automated exposure control, adjustment of the mA and/or kV according to patient size and/or use of iterative reconstruction technique. COMPARISON:  08/06/2022 FINDINGS: Brain: There is no mass, hemorrhage or extra-axial collection. The size and configuration of the ventricles and extra-axial CSF spaces are normal. The brain parenchyma is normal, without acute or chronic infarction. Vascular: No abnormal hyperdensity of the major intracranial arteries or dural venous sinuses. No intracranial atherosclerosis. Unchanged position of coil mass near the left carotid terminus. Skull: The visualized skull base, calvarium and extracranial soft tissues are normal. Sinuses/Orbits: No fluid levels or advanced  mucosal thickening of the visualized paranasal sinuses. No mastoid or middle ear effusion. The orbits are normal. IMPRESSION: 1. No acute intracranial abnormality. 2. Unchanged position of coil mass near the left carotid terminus. Electronically Signed   By: Deatra Robinson M.D.   On: 09/02/2022 22:10    Procedures Procedures    Medications Ordered in ED Medications  HYDROcodone-acetaminophen (NORCO/VICODIN) 5-325 MG per tablet 1 tablet (1 tablet Oral Given 09/04/22 1102)  ondansetron (ZOFRAN-ODT) disintegrating tablet 8 mg (8 mg Oral Given 09/04/22 1102)    ED Course/ Medical Decision Making/ A&P                             Medical Decision Making Amount and/or Complexity of Data Reviewed Radiology: ordered.  Risk Prescription drug management.   Medical Decision Making / ED Course   This patient presents to the ED for concern of fall, this involves an extensive number of  treatment options, and is a complaint that carries with it a high risk of complications and morbidity.  The differential diagnosis includes fracture, aneurysmal bleed, bruising, strain  MDM: 62 year old female presents today following a fall.  She complains of right knee pain, and reports potential head injury.  She does have history of cerebral aneurysm.  No headache currently.  However given this potential will obtain CT head.  Will obtain right knee x-ray.  Will provide pain medication.  Right knee x-ray without acute bony abnormality.  CT head without acute intracranial finding.  Particularly no aneurysmal bleed.  Symptomatic management discussed.  Patient is appropriate for discharge.  Discharged in stable condition.  She has been provided with knee immobilizer and crutches at her request.   Lab Tests: -I ordered, reviewed, and interpreted labs.   The pertinent results include:   Labs Reviewed - No data to display    EKG  EKG Interpretation  Date/Time:    Ventricular Rate:    PR Interval:    QRS  Duration:   QT Interval:    QTC Calculation:   R Axis:     Text Interpretation:           Imaging Studies ordered: I ordered imaging studies including ct head, right knee x ray I independently visualized and interpreted imaging. I agree with the radiologist interpretation   Medicines ordered and prescription drug management: Meds ordered this encounter  Medications   HYDROcodone-acetaminophen (NORCO/VICODIN) 5-325 MG per tablet 1 tablet   ondansetron (ZOFRAN-ODT) disintegrating tablet 8 mg    -I have reviewed the patients home medicines and have made adjustments as needed   Reevaluation: After the interventions noted above, I reevaluated the patient and found that they have :improved  Co morbidities that complicate the patient evaluation  Past Medical History:  Diagnosis Date   Anxiety    Asthma    COPD (chronic obstructive pulmonary disease)    GERD (gastroesophageal reflux disease)    High cholesterol    Hypertension    Pre-diabetes       Dispostion: Patient discharged in stable condition. Return precautions discussed.  Final Clinical Impression(s) / ED Diagnoses Final diagnoses:  Acute pain of right knee  Fall, initial encounter  Aneurysm    Rx / DC Orders ED Discharge Orders     None         Marita Kansas, PA-C 09/04/22 1201    Wynetta Fines, MD 09/04/22 [phone]

## 2022-09-04 NOTE — ED Triage Notes (Signed)
Patient presents to ed via PATR states sje was walking her dog was going down and ramp and the dog pulled making her loose her balance and landed on her right knee and chest , c/o right knee pain patient is able to ambulate without difficulty.

## 2022-09-04 NOTE — ED Notes (Signed)
Pt has been provided with a cab voucher for herself and her grandson.

## 2022-09-04 NOTE — Discharge Instructions (Signed)
Your workup today was reassuring.  CT scan of the head did not show any concerning findings.  The x-ray did not show any fractures.  You were given knee immobilizer and crutches.  For any concerning symptoms return to the emergency department otherwise follow-up with your primary care provider.

## 2022-09-04 NOTE — ED Notes (Signed)
Patient transported to X-ray 

## 2022-09-04 NOTE — Progress Notes (Signed)
Orthopedic Tech Progress Note Patient Details:  Ashley Hodge 1960-09-18 [id number]  Ortho Devices Type of Ortho Device: Knee Immobilizer Ortho Device/Splint Location: RLE Ortho Device/Splint Interventions: Ordered, Adjustment, Application   Post Interventions Patient Tolerated: Well Instructions Provided: Care of device, Adjustment of device Crutches not given due to patient stating they were uncomfortable using them and would prefer a walker instead. Notified MD and RN of this request.  Darleen Crocker 09/04/2022, 12:31 PM

## 2022-09-04 NOTE — ED Notes (Signed)
Waiting for walker from ortho tech

## 2022-09-04 NOTE — ED Notes (Signed)
Messaged case management regarding walker

## 2022-09-04 NOTE — ED Notes (Signed)
Ortho tech notified regarding need for knee immobilizer and crutches

## 2022-09-04 NOTE — ED Notes (Signed)
Case management will coordinate delivery of walker to patient's home.

## 2022-09-04 NOTE — ED Notes (Signed)
Pt states she uses an electric wheel chair and will need a taxi voucher.

## 2022-09-21 ENCOUNTER — Other Ambulatory Visit: Payer: Self-pay | Admitting: Family Medicine

## 2022-09-21 DIAGNOSIS — M25561 Pain in right knee: Secondary | ICD-10-CM

## 2022-10-05 ENCOUNTER — Other Ambulatory Visit: Payer: Self-pay | Admitting: Neurosurgery

## 2022-10-05 DIAGNOSIS — I671 Cerebral aneurysm, nonruptured: Secondary | ICD-10-CM

## 2022-10-06 ENCOUNTER — Other Ambulatory Visit: Payer: Self-pay | Admitting: Neurosurgery

## 2022-10-09 ENCOUNTER — Encounter (HOSPITAL_COMMUNITY): Payer: Self-pay

## 2022-10-09 ENCOUNTER — Emergency Department (HOSPITAL_COMMUNITY)
Admission: EM | Admit: 2022-10-09 | Discharge: 2022-10-09 | Disposition: A | Discharge status: Home / Self Care | Payer: Medicare HMO | Attending: Emergency Medicine | Admitting: Emergency Medicine

## 2022-10-09 ENCOUNTER — Emergency Department (HOSPITAL_COMMUNITY): Payer: Medicare HMO

## 2022-10-09 DIAGNOSIS — Z79899 Other long term (current) drug therapy: Secondary | ICD-10-CM | POA: Insufficient documentation

## 2022-10-09 DIAGNOSIS — M25511 Pain in right shoulder: Secondary | ICD-10-CM | POA: Diagnosis not present

## 2022-10-09 DIAGNOSIS — J449 Chronic obstructive pulmonary disease, unspecified: Secondary | ICD-10-CM | POA: Insufficient documentation

## 2022-10-09 DIAGNOSIS — I10 Essential (primary) hypertension: Secondary | ICD-10-CM | POA: Insufficient documentation

## 2022-10-09 DIAGNOSIS — J45909 Unspecified asthma, uncomplicated: Secondary | ICD-10-CM | POA: Insufficient documentation

## 2022-10-09 DIAGNOSIS — M25512 Pain in left shoulder: Secondary | ICD-10-CM | POA: Insufficient documentation

## 2022-10-09 DIAGNOSIS — R202 Paresthesia of skin: Secondary | ICD-10-CM | POA: Diagnosis present

## 2022-10-09 LAB — URINALYSIS, ROUTINE W REFLEX MICROSCOPIC
Bilirubin Urine: NEGATIVE
Glucose, UA: NEGATIVE mg/dL
Hgb urine dipstick: NEGATIVE
Ketones, ur: NEGATIVE mg/dL
Nitrite: NEGATIVE
Protein, ur: NEGATIVE mg/dL
Specific Gravity, Urine: 1.003 — ABNORMAL LOW (ref 1.005–1.030)
pH: 6 (ref 5.0–8.0)

## 2022-10-09 LAB — CBC
HCT: 43.4 % (ref 36.0–46.0)
Hemoglobin: 13.9 g/dL (ref 12.0–15.0)
MCH: 30.6 pg (ref 26.0–34.0)
MCHC: 32 g/dL (ref 30.0–36.0)
MCV: 95.6 fL (ref 80.0–100.0)
Platelets: 282 10*3/uL (ref 150–400)
RBC: 4.54 MIL/uL (ref 3.87–5.11)
RDW: 11.7 % (ref 11.5–15.5)
WBC: 12.2 10*3/uL — ABNORMAL HIGH (ref 4.0–10.5)
nRBC: 0 % (ref 0.0–0.2)

## 2022-10-09 LAB — BASIC METABOLIC PANEL
Anion gap: 11 (ref 5–15)
BUN: 10 mg/dL (ref 8–23)
CO2: 24 mmol/L (ref 22–32)
Calcium: 9.1 mg/dL (ref 8.9–10.3)
Chloride: 104 mmol/L (ref 98–111)
Creatinine, Ser: 0.91 mg/dL (ref 0.44–1.00)
GFR, Estimated: >60 mL/min (ref >60)
Glucose, Bld: 93 mg/dL (ref 70–99)
Potassium: 4.8 mmol/L (ref 3.5–5.1)
Sodium: 139 mmol/L (ref 135–145)

## 2022-10-09 LAB — TROPONIN I (HIGH SENSITIVITY): Troponin I (High Sensitivity): 5 ng/L (ref <18)

## 2022-10-09 NOTE — ED Triage Notes (Addendum)
Pt c/o left hand tingling and numbness since 10a. Pt also c/o left upper side back pain. Pt states some relief with tylenol .  Pt has FROM, equal sensation  Pt requests troponin and EKG

## 2022-10-09 NOTE — ED Provider Notes (Signed)
[place] EMERGENCY DEPARTMENT AT [facility] Provider Note   CSN: [id number] Arrival date & time: 10/09/22  1406     History  Chief Complaint  Patient presents with   Numbness    Ashley Hodge is a 62 y.o. female.  HPI Patient presents with left hand numbness and tingling.  Began at 10 AM and went for around 4 hours.  States resolved really after she got to the ER.  Some tightness in her shoulders.  States she does have a history of aneurysms in her head.  Follows Dr. Conchita Paris for this.  No headache.  No confusion.  Does have tightness in her upper shoulders.  No chest pain.  States she is back at baseline now.  Patient is in a wheelchair at baseline states she has been using it more and thinks that could be why her shoulders are more sore.   Past Medical History:  Diagnosis Date   Anxiety    Asthma    COPD (chronic obstructive pulmonary disease) (HCC)    GERD (gastroesophageal reflux disease)    High cholesterol    Hypertension    Pre-diabetes     Home Medications Prior to Admission medications   Medication Sig Start Date End Date Taking? Authorizing Provider  albuterol (VENTOLIN HFA) 108 (90 Base) MCG/ACT inhaler Inhale 1-2 puffs into the lungs every 6 (six) hours as needed for wheezing or shortness of breath.    [provider]  BELSOMRA 10 MG TABS Take 1 tablet by mouth at bedtime. 06/07/22   [provider]  ezetimibe (ZETIA) 10 MG tablet Take 10 mg by mouth daily.    [provider]  Ibuprofen-Acetaminophen (ADVIL DUAL ACTION) 125-250 MG TABS Take 1-2 tablets by mouth every 6 (six) hours as needed (pain).    [provider]  losartan (COZAAR) 100 MG tablet Take 100 mg by mouth at bedtime.    [provider]  ondansetron (ZOFRAN-ODT) 4 MG disintegrating tablet Take 1 tablet (4 mg total) by mouth every 8 (eight) hours as needed for nausea or vomiting. 02/01/22   Mardene Sayer, MD  pantoprazole (PROTONIX) 20 MG  tablet Take 20 mg by mouth at bedtime. 05/11/21   [provider]  rosuvastatin (CRESTOR) 20 MG tablet Take 20 mg by mouth at bedtime. 05/11/21   [provider]  fluticasone (FLONASE) 50 MCG/ACT nasal spray Place 2 sprays into both nostrils daily. Patient not taking: Reported on 12/10/2019 11/04/19 01/31/20  Myra Rude, MD  ipratropium-albuterol (DUONEB) 0.5-2.5 (3) MG/3ML SOLN Take 3 mLs by nebulization every 6 (six) hours as needed (Wheezing and SOB). Patient not taking: Reported on 12/25/2019 12/11/19 01/31/20  Marguerita Merles Latif, DO      Allergies    Lisinopril    Review of Systems   Review of Systems  Physical Exam Updated Vital Signs BP (!) 154/81   Pulse 74   Temp 98.6 F (37 C) (Oral)   Resp 16   Ht 5' (1.524 m)   Wt 75.8 kg   SpO2 95%   BMI 32.61 kg/m  Physical Exam Vitals and nursing note reviewed.  Neck:     Comments: Mild tenderness over bilateral trapezius. Pulmonary:     Breath sounds: No wheezing or rhonchi.  Abdominal:     Tenderness: There is no abdominal tenderness.  Musculoskeletal:     Cervical back: Neck supple.  Neurological:     Mental Status: She is alert.     Comments:  Neurovascular intact in left hand.  Strength intact.  Sensation intact over radial median and ulnar distributions.  Strong radial pulse.     ED Results / Procedures / Treatments   Labs (all labs ordered are listed, but only abnormal results are displayed) Labs Reviewed  CBC - Abnormal; Notable for the following components:      Result Value   WBC 12.2 (*)    All other components within normal limits  URINALYSIS, ROUTINE W REFLEX MICROSCOPIC - Abnormal; Notable for the following components:   Color, Urine STRAW (*)    Specific Gravity, Urine 1.003 (*)    Leukocytes,Ua TRACE (*)    Bacteria, UA RARE (*)    All other components within normal limits  BASIC METABOLIC PANEL  TROPONIN I (HIGH SENSITIVITY)  TROPONIN I (HIGH SENSITIVITY)     EKG None  Radiology DG Chest 2 View  Result Date: 10/09/2022 CLINICAL DATA:  Chest pain. EXAM: CHEST - 2 VIEW COMPARISON:  Chest x-ray dated June 05, 2022. FINDINGS: The heart size and mediastinal contours are within normal limits. Both lungs are clear. The visualized skeletal structures are unremarkable. IMPRESSION: No active cardiopulmonary disease. Electronically Signed   By: Obie Dredge M.D.   On: 10/09/2022 15:15    Procedures Procedures    Medications Ordered in ED Medications - No data to display  ED Course/ Medical Decision Making/ A&P                             Medical Decision Making Amount and/or Complexity of Data Reviewed Labs: ordered. Radiology: ordered.   Patient with left hand paresthesias.  Previous aneurysm but appears to be left-sided.  Doubt this is because of the paresthesias.  Does have some muscular tenderness over the shoulders.  Potentially could be because of the paresthesias in the hand.  It has resolved.  EKG and troponin reassuring.  Appears stable for outpatient follow-up.        Final Clinical Impression(s) / ED Diagnoses Final diagnoses:  Paresthesia    Rx / DC Orders ED Discharge Orders     None         Benjiman Core, MD 10/09/22 [phone]

## 2022-10-11 ENCOUNTER — Emergency Department (HOSPITAL_COMMUNITY)
Admission: EM | Admit: 2022-10-11 | Discharge: 2022-10-12 | Disposition: A | Discharge status: Home / Self Care | Payer: Medicare HMO | Attending: Emergency Medicine | Admitting: Emergency Medicine

## 2022-10-11 ENCOUNTER — Other Ambulatory Visit: Payer: Self-pay

## 2022-10-11 ENCOUNTER — Encounter (HOSPITAL_COMMUNITY): Payer: Self-pay | Admitting: Emergency Medicine

## 2022-10-11 ENCOUNTER — Emergency Department (HOSPITAL_COMMUNITY): Payer: Medicare HMO

## 2022-10-11 DIAGNOSIS — F419 Anxiety disorder, unspecified: Secondary | ICD-10-CM

## 2022-10-11 DIAGNOSIS — R519 Headache, unspecified: Secondary | ICD-10-CM

## 2022-10-11 LAB — CBC WITH DIFFERENTIAL/PLATELET
Abs Immature Granulocytes: 0.02 10*3/uL (ref 0.00–0.07)
Basophils Absolute: 0.1 10*3/uL (ref 0.0–0.1)
Basophils Relative: 1 %
Eosinophils Absolute: 0.3 10*3/uL (ref 0.0–0.5)
Eosinophils Relative: 3 %
HCT: 43.3 % (ref 36.0–46.0)
Hemoglobin: 14 g/dL (ref 12.0–15.0)
Immature Granulocytes: 0 %
Lymphocytes Relative: 29 %
Lymphs Abs: 3 10*3/uL (ref 0.7–4.0)
MCH: 30.8 pg (ref 26.0–34.0)
MCHC: 32.3 g/dL (ref 30.0–36.0)
MCV: 95.4 fL (ref 80.0–100.0)
Monocytes Absolute: 0.8 10*3/uL (ref 0.1–1.0)
Monocytes Relative: 7 %
Neutro Abs: 6.4 10*3/uL (ref 1.7–7.7)
Neutrophils Relative %: 60 %
Platelets: 252 10*3/uL (ref 150–400)
RBC: 4.54 MIL/uL (ref 3.87–5.11)
RDW: 11.5 % (ref 11.5–15.5)
WBC: 10.6 10*3/uL — ABNORMAL HIGH (ref 4.0–10.5)
nRBC: 0 % (ref 0.0–0.2)

## 2022-10-11 LAB — BASIC METABOLIC PANEL
Anion gap: 14 (ref 5–15)
BUN: 9 mg/dL (ref 8–23)
CO2: 21 mmol/L — ABNORMAL LOW (ref 22–32)
Calcium: 9.1 mg/dL (ref 8.9–10.3)
Chloride: 108 mmol/L (ref 98–111)
Creatinine, Ser: 0.78 mg/dL (ref 0.44–1.00)
GFR, Estimated: >60 mL/min (ref >60)
Glucose, Bld: 109 mg/dL — ABNORMAL HIGH (ref 70–99)
Potassium: 3.6 mmol/L (ref 3.5–5.1)
Sodium: 143 mmol/L (ref 135–145)

## 2022-10-11 MED ORDER — OXYCODONE HCL 5 MG PO TABS
5.0000 mg | ORAL_TABLET | Freq: Once | ORAL | Status: AC
  Administered 2022-10-11: 5 mg via ORAL
  Filled 2022-10-11: qty 1

## 2022-10-11 MED ORDER — DIAZEPAM 2 MG PO TABS
2.0000 mg | ORAL_TABLET | Freq: Once | ORAL | Status: AC
  Administered 2022-10-11: 2 mg via ORAL
  Filled 2022-10-11: qty 1

## 2022-10-11 NOTE — ED Provider Triage Note (Signed)
Emergency Medicine Provider Triage Evaluation Note  SHANTERIA HEDGE , a 62 y.o. female  was evaluated in triage.  Pt complains of headache.  Pt has a hx of an aneurysm s/p coiling by Dr. Conchita Paris.  Pt developed a headache tonight.  BP is elevated.  Pt said she took her bp med tonight at 2000.  Pt denies numbness or weakness.  She is very anxious.    Review of Systems  Positive: headache Negative: Neuro sx  Physical Exam  BP (!) 182/95 (BP Location: Left Arm)   Pulse 89   Temp 98.2 F (36.8 C) (Oral)   Resp (!) 22   Wt 75.8 kg   SpO2 98%   BMI 32.61 kg/m  Gen:   Awake, no distress   Resp:  Normal effort  MSK:   Moves extremities without difficulty  Other:  Tearful, anxious  Medical Decision Making  Medically screening exam initiated at 10:33 PM.  Appropriate orders placed.  DAWNNE CATALLO was informed that the remainder of the evaluation will be completed by another provider, this initial triage assessment does not replace that evaluation, and the importance of remaining in the ED until their evaluation is complete.     Jacalyn Lefevre, MD 10/11/22 2235

## 2022-10-11 NOTE — ED Notes (Signed)
Verified with [facility] provider that it is ok for pt to go to lobby. Pt aware of need to remain within earshot of lobby doors as she is on the list for a CT

## 2022-10-11 NOTE — ED Triage Notes (Addendum)
Presents via EMS for L lateral HA starting 9pm.  H/o aneurysm discovered in Feb, 2 coils placed. She has never had HA like this previously.  H/o HTN, anxiety, smoker  EMS exam: LVO neg, 201/119 (states she took todays meds), HR 92, spO2 98%RA, 18RR.   No fever or neck stiffness endorsed

## 2022-10-12 ENCOUNTER — Emergency Department (HOSPITAL_COMMUNITY): Payer: Medicare HMO

## 2022-10-12 DIAGNOSIS — R519 Headache, unspecified: Secondary | ICD-10-CM | POA: Diagnosis not present

## 2022-10-12 MED ORDER — LACTATED RINGERS IV BOLUS
1000.0000 mL | Freq: Once | INTRAVENOUS | Status: AC
  Administered 2022-10-12: 1000 mL via INTRAVENOUS

## 2022-10-12 MED ORDER — DIAZEPAM 2 MG PO TABS: 2.0000 mg | ORAL_TABLET | Freq: Two times a day (BID) | ORAL | 0 refills | Status: AC | PRN

## 2022-10-12 MED ORDER — IOHEXOL 350 MG/ML SOLN
75.0000 mL | Freq: Once | INTRAVENOUS | Status: AC | PRN
  Administered 2022-10-12: 75 mL via INTRAVENOUS

## 2022-10-12 NOTE — Discharge Instructions (Addendum)
Your CT scan showed no new changes with regard to your aneurysms.  Return for new or worsening symptoms.  Otherwise, please follow-up with your doctors.

## 2022-10-12 NOTE — ED Notes (Signed)
Pt returned from CT °

## 2022-10-12 NOTE — ED Provider Notes (Signed)
[facility] DEPT [facility] Emergency Department Provider Note MRN:  [id number]  Arrival date & time: 10/12/22     Chief Complaint   Headache (H/o aneurysm )   History of Present Illness   Ashley Hodge is a 62 y.o. year-old female presents to the ED with chief complaint of headache.  Reports history of the same.  Has history of aneurysms.  She states that she began having headache tonight that she could not get rid of and became concerned about her aneurysms.  She states that she feels much better after having Percocet and Valium in triage.  She states that she is scheduled for follow-up for her aneurysms at the end of June.  She denies fevers or chills.  Denies any other associated symptoms.  History provided by patient.   Review of Systems  Pertinent positive and negative review of systems noted in HPI.    Physical Exam   Vitals:   10/12/22 0100 10/12/22 0115  BP: (!) 159/72 (!) 148/77  Pulse: 75 72  Resp: 16   Temp:    SpO2: 95% 95%    CONSTITUTIONAL:  well-appearing, NAD NEURO:  Alert and oriented x 3, CN 3-12 grossly intact, normal finger to nose, no ataxia EYES:  eyes equal and reactive ENT/NECK:  Supple, no stridor  CARDIO:  normal rate, regular rhythm, appears well-perfused  PULM:  No respiratory distress, CTAB GI/GU:  non-distended,  MSK/SPINE:  No gross deformities, no edema, moves all extremities  SKIN:  no rash, atraumatic   *Additional and/or pertinent findings included in MDM below  Diagnostic and Interventional Summary    EKG Interpretation  Date/Time:    Ventricular Rate:    PR Interval:    QRS Duration:   QT Interval:    QTC Calculation:   R Axis:     Text Interpretation:         Labs Reviewed  BASIC METABOLIC PANEL - Abnormal; Notable for the following components:      Result Value   CO2 21 (*)    Glucose, Bld 109 (*)    All other components within normal limits  CBC WITH DIFFERENTIAL/PLATELET - Abnormal; Notable for the  following components:   WBC 10.6 (*)    All other components within normal limits    CT ANGIO HEAD NECK W WO CM  Final Result    CT Head Wo Contrast  Final Result      Medications  oxyCODONE (Oxy IR/ROXICODONE) immediate release tablet 5 mg (5 mg Oral Given 10/11/22 2235)  diazepam (VALIUM) tablet 2 mg (2 mg Oral Given 10/11/22 2235)  lactated ringers bolus 1,000 mL (0 mLs Intravenous Stopped 10/12/22 0251)  iohexol (OMNIPAQUE) 350 MG/ML injection 75 mL (75 mLs Intravenous Contrast Given 10/12/22 0131)     Procedures  /  Critical Care Procedures  ED Course and Medical Decision Making  I have reviewed the triage vital signs, the nursing notes, and pertinent available records from the EMR.  Social Determinants Affecting Complexity of Care: Patient has no clinically significant social determinants affecting this chief complaint..   ED Course:    Medical Decision Making Patient here with headache.  Has history of the same.  Will check angio of head to rule out any changes in her aneurysms.  No new changes to known aneurysms noted by radiology.  Patient's symptoms have resolved.  She is sitting up in bed eating food and appears comfortable.  She is agreeable with plan for discharge.  She states  that her anxiety got so bad that it significantly contributed to her symptoms and asks if she could have something for anxiety.  I will give her a few tablets of low-dose Valium because it worked well for her tonight.  Amount and/or Complexity of Data Reviewed Radiology: ordered.  Risk Prescription drug management.     Consultants: No consultations were needed in caring for this patient.   Treatment and Plan: I considered admission due to patient's initial presentation, but after considering the examination and diagnostic results, patient will not require admission and can be discharged with outpatient follow-up.    Final Clinical Impressions(s) / ED Diagnoses     ICD-10-CM   1.  Nonintractable headache, unspecified chronicity pattern, unspecified headache type  R51.9     2. Anxiety  F41.9       ED Discharge Orders          Ordered    diazepam (VALIUM) 2 MG tablet  Every 12 hours PRN        10/12/22 0258              Discharge Instructions Discussed with and Provided to Patient:    Discharge Instructions      Your CT scan showed no new changes with regard to your aneurysms.  Return for new or worsening symptoms.  Otherwise, please follow-up with your doctors.      Roxy Horseman, PA-C 10/12/22 0302    Gilda Crease, MD 10/12/22 [phone]

## 2022-11-02 ENCOUNTER — Other Ambulatory Visit: Payer: Self-pay

## 2022-11-02 ENCOUNTER — Emergency Department (HOSPITAL_COMMUNITY)
Admission: EM | Admit: 2022-11-02 | Discharge: 2022-11-02 | Disposition: A | Discharge status: Home / Self Care | Payer: Medicare HMO | Attending: Emergency Medicine | Admitting: Emergency Medicine

## 2022-11-02 DIAGNOSIS — W57XXXA Bitten or stung by nonvenomous insect and other nonvenomous arthropods, initial encounter: Secondary | ICD-10-CM | POA: Insufficient documentation

## 2022-11-02 DIAGNOSIS — S20461A Insect bite (nonvenomous) of right back wall of thorax, initial encounter: Secondary | ICD-10-CM | POA: Insufficient documentation

## 2022-11-02 MED ORDER — ONDANSETRON 4 MG PO TBDP
4.0000 mg | ORAL_TABLET | Freq: Once | ORAL | Status: AC
  Administered 2022-11-02: 4 mg via ORAL
  Filled 2022-11-02: qty 1

## 2022-11-02 MED ORDER — MUPIROCIN CALCIUM 2 % EX CREA
TOPICAL_CREAM | Freq: Once | CUTANEOUS | Status: AC
  Filled 2022-11-02: qty 15

## 2022-11-02 NOTE — ED Triage Notes (Signed)
Pt reports bug bite to right arm pit area.

## 2022-11-02 NOTE — Discharge Instructions (Signed)
Your wound will improve in the next 2 to 3 days with the provided antibiotic cream and warm compresses.  If you develop new, or concerning changes do not hesitate to return here.

## 2022-11-02 NOTE — ED Provider Notes (Signed)
[place] EMERGENCY DEPARTMENT AT [facility] Provider Note   CSN: [id number] Arrival date & time: 11/02/22  1414     History  Chief Complaint  Patient presents with   Insect Bite    Ashley Hodge is a 62 y.o. female.  HPI Patient presents with concern of pain around a lesion in the right posterior axilla.  She states that she believes she was bit yesterday, has had itchy sensation, but pain in that area since the event.  No fevers, chills, nausea is somewhat present, but no vomiting.     Home Medications Prior to Admission medications   Medication Sig Start Date End Date Taking? Authorizing Provider  albuterol (VENTOLIN HFA) 108 (90 Base) MCG/ACT inhaler Inhale 1-2 puffs into the lungs every 6 (six) hours as needed for wheezing or shortness of breath.    [provider]  BELSOMRA 10 MG TABS Take 1 tablet by mouth at bedtime. 06/07/22   [provider]  diazepam (VALIUM) 2 MG tablet Take 1 tablet (2 mg total) by mouth every 12 (twelve) hours as needed for anxiety. 10/12/22   Roxy Horseman, PA-C  ezetimibe (ZETIA) 10 MG tablet Take 10 mg by mouth daily.    [provider]  Ibuprofen-Acetaminophen (ADVIL DUAL ACTION) 125-250 MG TABS Take 1-2 tablets by mouth every 6 (six) hours as needed (pain).    [provider]  losartan (COZAAR) 100 MG tablet Take 100 mg by mouth at bedtime.    [provider]  ondansetron (ZOFRAN-ODT) 4 MG disintegrating tablet Take 1 tablet (4 mg total) by mouth every 8 (eight) hours as needed for nausea or vomiting. 02/01/22   Mardene Sayer, MD  pantoprazole (PROTONIX) 20 MG tablet Take 20 mg by mouth at bedtime. 05/11/21   [provider]  rosuvastatin (CRESTOR) 20 MG tablet Take 20 mg by mouth at bedtime. 05/11/21   [provider]  fluticasone (FLONASE) 50 MCG/ACT nasal spray Place 2 sprays into both nostrils daily. Patient not taking: Reported on 12/10/2019 11/04/19 01/31/20   Myra Rude, MD  ipratropium-albuterol (DUONEB) 0.5-2.5 (3) MG/3ML SOLN Take 3 mLs by nebulization every 6 (six) hours as needed (Wheezing and SOB). Patient not taking: Reported on 12/25/2019 12/11/19 01/31/20  Marguerita Merles Latif, DO      Allergies    Lisinopril    Review of Systems   Review of Systems  All other systems reviewed and are negative.   Physical Exam Updated Vital Signs BP (!) 144/82 (BP Location: Left Arm)   Pulse (!) 107   Temp 98.4 F (36.9 C) (Oral)   Resp 17   Ht 5' (1.524 m)   Wt 75 kg   SpO2 95%   BMI 32.29 kg/m  Physical Exam Vitals and nursing note reviewed.  Constitutional:      General: She is not in acute distress.    Appearance: She is well-developed.  HENT:     Head: Normocephalic and atraumatic.  Eyes:     Conjunctiva/sclera: Conjunctivae normal.  Pulmonary:     Effort: Pulmonary effort is normal. No respiratory distress.     Breath sounds: No stridor.  Abdominal:     General: There is no distension.  Skin:    General: Skin is warm and dry.       Neurological:     Mental Status: She is alert and oriented to person, place, and time.     Cranial Nerves: No cranial nerve deficit.  Psychiatric:  Mood and Affect: Mood normal.     ED Results / Procedures / Treatments   Labs (all labs ordered are listed, but only abnormal results are displayed) Labs Reviewed - No data to display  EKG None  Radiology No results found.  Procedures Procedures    Medications Ordered in ED Medications  mupirocin cream (BACTROBAN) 2 % (has no administration in time range)  ondansetron (ZOFRAN-ODT) disintegrating tablet 4 mg (has no administration in time range)    ED Course/ Medical Decision Making/ A&P                             Medical Decision Making Adult female presents with concern of itchiness around a lesion in the right posterior axilla.  Suspicion for insect bite, some surrounding inflammation, though early infection is a  possibility.  No evidence for bacteremia, sepsis. Patient started on topical medication, discharged in stable condition.  Amount and/or Complexity of Data Reviewed External Data Reviewed: notes.  Risk Prescription drug management.  Final Clinical Impression(s) / ED Diagnoses Final diagnoses:  Insect bite of right back wall of thorax, initial encounter    Rx / DC Orders ED Discharge Orders     None         Gerhard Munch, MD 11/02/22 1435

## 2022-11-08 ENCOUNTER — Other Ambulatory Visit: Payer: Self-pay | Admitting: Neurosurgery

## 2022-11-08 ENCOUNTER — Encounter (HOSPITAL_COMMUNITY): Payer: Self-pay

## 2022-11-08 ENCOUNTER — Other Ambulatory Visit: Payer: Self-pay

## 2022-11-08 ENCOUNTER — Observation Stay (HOSPITAL_COMMUNITY)
Admission: RE | Admit: 2022-11-08 | Discharge: 2022-11-09 | Disposition: A | Discharge status: Home / Self Care | Payer: Medicare HMO | Source: Ambulatory Visit | Attending: Neurosurgery | Admitting: Neurosurgery

## 2022-11-08 DIAGNOSIS — I671 Cerebral aneurysm, nonruptured: Secondary | ICD-10-CM

## 2022-11-08 DIAGNOSIS — F17210 Nicotine dependence, cigarettes, uncomplicated: Secondary | ICD-10-CM | POA: Diagnosis not present

## 2022-11-08 DIAGNOSIS — J449 Chronic obstructive pulmonary disease, unspecified: Secondary | ICD-10-CM | POA: Insufficient documentation

## 2022-11-08 DIAGNOSIS — Z79899 Other long term (current) drug therapy: Secondary | ICD-10-CM | POA: Diagnosis not present

## 2022-11-08 DIAGNOSIS — I10 Essential (primary) hypertension: Secondary | ICD-10-CM | POA: Insufficient documentation

## 2022-11-08 HISTORY — PX: IR US GUIDE VASC ACCESS RIGHT: IMG2390

## 2022-11-08 HISTORY — PX: IR ANGIO INTRA EXTRACRAN SEL INTERNAL CAROTID UNI L MOD SED: IMG5361

## 2022-11-08 LAB — CBC WITH DIFFERENTIAL/PLATELET
Abs Immature Granulocytes: 0.02 10*3/uL (ref 0.00–0.07)
Basophils Absolute: 0.1 10*3/uL (ref 0.0–0.1)
Basophils Relative: 1 %
Eosinophils Absolute: 0.3 10*3/uL (ref 0.0–0.5)
Eosinophils Relative: 3 %
HCT: 44.1 % (ref 36.0–46.0)
Hemoglobin: 14.2 g/dL (ref 12.0–15.0)
Immature Granulocytes: 0 %
Lymphocytes Relative: 23 %
Lymphs Abs: 2.2 10*3/uL (ref 0.7–4.0)
MCH: 30.1 pg (ref 26.0–34.0)
MCHC: 32.2 g/dL (ref 30.0–36.0)
MCV: 93.6 fL (ref 80.0–100.0)
Monocytes Absolute: 0.8 10*3/uL (ref 0.1–1.0)
Monocytes Relative: 9 %
Neutro Abs: 6.1 10*3/uL (ref 1.7–7.7)
Neutrophils Relative %: 64 %
Platelets: 278 10*3/uL (ref 150–400)
RBC: 4.71 MIL/uL (ref 3.87–5.11)
RDW: 11.6 % (ref 11.5–15.5)
WBC: 9.4 10*3/uL (ref 4.0–10.5)
nRBC: 0 % (ref 0.0–0.2)

## 2022-11-08 LAB — GLUCOSE, CAPILLARY: Glucose-Capillary: 112 mg/dL — ABNORMAL HIGH (ref 70–99)

## 2022-11-08 LAB — BASIC METABOLIC PANEL
Anion gap: 8 (ref 5–15)
BUN: 10 mg/dL (ref 8–23)
CO2: 27 mmol/L (ref 22–32)
Calcium: 9.1 mg/dL (ref 8.9–10.3)
Chloride: 104 mmol/L (ref 98–111)
Creatinine, Ser: 0.86 mg/dL (ref 0.44–1.00)
GFR, Estimated: >60 mL/min (ref >60)
Glucose, Bld: 116 mg/dL — ABNORMAL HIGH (ref 70–99)
Potassium: 4.2 mmol/L (ref 3.5–5.1)
Sodium: 139 mmol/L (ref 135–145)

## 2022-11-08 LAB — PROTIME-INR
INR: 0.9 (ref 0.8–1.2)
Prothrombin Time: 12.7 seconds (ref 11.4–15.2)

## 2022-11-08 MED ORDER — SODIUM CHLORIDE 0.9 % IV SOLN: INTRAVENOUS | Status: DC

## 2022-11-08 MED ORDER — CEFAZOLIN SODIUM-DEXTROSE 2-4 GM/100ML-% IV SOLN: 2.0000 g | INTRAVENOUS | Status: AC

## 2022-11-08 MED ORDER — DIAZEPAM 2 MG PO TABS: 2.0000 mg | ORAL_TABLET | Freq: Two times a day (BID) | ORAL | Status: DC | PRN

## 2022-11-08 MED ORDER — HYDROCODONE-ACETAMINOPHEN 5-325 MG PO TABS: 1.0000 | ORAL_TABLET | ORAL | Status: DC | PRN

## 2022-11-08 MED ORDER — LOSARTAN POTASSIUM 50 MG PO TABS
100.0000 mg | ORAL_TABLET | Freq: Every day | ORAL | Status: DC
  Filled 2022-11-08: qty 2

## 2022-11-08 MED ORDER — ACETAMINOPHEN 650 MG RE SUPP: 650.0000 mg | RECTAL | Status: DC | PRN

## 2022-11-08 MED ORDER — FENTANYL CITRATE (PF) 100 MCG/2ML IJ SOLN
INTRAMUSCULAR | Status: AC
  Filled 2022-11-08: qty 2

## 2022-11-08 MED ORDER — MIDAZOLAM HCL 2 MG/2ML IJ SOLN
INTRAMUSCULAR | Status: AC | PRN
  Administered 2022-11-08 (×2): .5 mg via INTRAVENOUS

## 2022-11-08 MED ORDER — NITROGLYCERIN 1 MG/10 ML FOR IR/CATH LAB
INTRA_ARTERIAL | Status: AC
  Filled 2022-11-08: qty 10

## 2022-11-08 MED ORDER — VERAPAMIL HCL 2.5 MG/ML IV SOLN
INTRAVENOUS | Status: AC
  Filled 2022-11-08: qty 2

## 2022-11-08 MED ORDER — ACETAMINOPHEN 325 MG PO TABS
650.0000 mg | ORAL_TABLET | ORAL | Status: DC | PRN
  Administered 2022-11-08: 650 mg via ORAL

## 2022-11-08 MED ORDER — PANTOPRAZOLE SODIUM 20 MG PO TBEC
20.0000 mg | DELAYED_RELEASE_TABLET | Freq: Every day | ORAL | Status: DC
  Filled 2022-11-08: qty 1

## 2022-11-08 MED ORDER — HEPARIN SODIUM (PORCINE) 1000 UNIT/ML IJ SOLN
INTRAMUSCULAR | Status: AC | PRN
  Administered 2022-11-08: 3000 [IU] via INTRAVENOUS

## 2022-11-08 MED ORDER — ALBUTEROL SULFATE (2.5 MG/3ML) 0.083% IN NEBU: 3.0000 mL | INHALATION_SOLUTION | Freq: Four times a day (QID) | RESPIRATORY_TRACT | Status: DC | PRN

## 2022-11-08 MED ORDER — VERAPAMIL HCL 2.5 MG/ML IV SOLN: INTRA_ARTERIAL | Status: AC | PRN

## 2022-11-08 MED ORDER — IOHEXOL 300 MG/ML  SOLN
150.0000 mL | Freq: Once | INTRAMUSCULAR | Status: AC | PRN
  Administered 2022-11-08: 25 mL via INTRA_ARTERIAL

## 2022-11-08 MED ORDER — LIDOCAINE HCL 1 % IJ SOLN
INTRAMUSCULAR | Status: AC
  Filled 2022-11-08: qty 20

## 2022-11-08 MED ORDER — EZETIMIBE 10 MG PO TABS
10.0000 mg | ORAL_TABLET | Freq: Every day | ORAL | Status: DC
  Filled 2022-11-08: qty 1

## 2022-11-08 MED ORDER — HEPARIN SODIUM (PORCINE) 1000 UNIT/ML IJ SOLN
INTRAMUSCULAR | Status: AC
  Filled 2022-11-08: qty 10

## 2022-11-08 MED ORDER — MIDAZOLAM HCL 2 MG/2ML IJ SOLN
INTRAMUSCULAR | Status: AC
  Filled 2022-11-08: qty 2

## 2022-11-08 MED ORDER — LIDOCAINE HCL 1 % IJ SOLN
10.0000 mL | Freq: Once | INTRAMUSCULAR | Status: AC
  Administered 2022-11-08: 2 mL via INTRADERMAL

## 2022-11-08 MED ORDER — ACETAMINOPHEN 325 MG PO TABS
ORAL_TABLET | ORAL | Status: AC
  Filled 2022-11-08: qty 2

## 2022-11-08 MED ORDER — ROSUVASTATIN CALCIUM 20 MG PO TABS
20.0000 mg | ORAL_TABLET | Freq: Every day | ORAL | Status: DC
  Filled 2022-11-08: qty 1

## 2022-11-08 MED ORDER — ONDANSETRON 4 MG PO TBDP: 4.0000 mg | ORAL_TABLET | Freq: Three times a day (TID) | ORAL | Status: DC | PRN

## 2022-11-08 MED ORDER — FENTANYL CITRATE (PF) 100 MCG/2ML IJ SOLN
INTRAMUSCULAR | Status: AC | PRN
  Administered 2022-11-08: 50 ug via INTRAVENOUS

## 2022-11-08 NOTE — Sedation Documentation (Signed)
Report given to Mark, RN.

## 2022-11-08 NOTE — Plan of Care (Signed)

## 2022-11-08 NOTE — H&P (Signed)
Chief Complaint   Aneurysm  History of Present Illness  Ashley Hodge is a 62 year old woman seen for initial consultation at the request of Dr. Estell Harpin. She is referred for evaluation of a recently discovered incidental intracranial aneurysms. Briefly, the patient noted onset of right-sided arm and leg numbness and tingling. She presented to the emergency department where workup included CT angiogram of the head demonstrating incidental aneurysms. She subsequently underwent angiogram followed by coil embolization of a LICA aneurysm, while we have elected to monitor a small Acom aneurysm. She presents today for routine short-term angiographic follow-up.  Of note, the patient does report a history of medically controlled hypertension. No history of diabetes or previous heart attack or stroke. Patient does have COPD. No history of liver or kidney disease. No cancer history. She is not on any blood thinners or anti-platelet agents. She smokes one pack per day. There is no known family history of intracranial aneurysms or unexplained intracranial hemorrhage, however she says her brother did have a "brain tumor";   Past Medical History   Past Medical History:  Diagnosis Date   Anxiety    Asthma    COPD (chronic obstructive pulmonary disease) (HCC)    GERD (gastroesophageal reflux disease)    High cholesterol    Hypertension    Pre-diabetes     Past Surgical History   Past Surgical History:  Procedure Laterality Date   IR ANGIO INTRA EXTRACRAN SEL INTERNAL CAROTID BILAT MOD SED  05/24/2022   IR ANGIO INTRA EXTRACRAN SEL INTERNAL CAROTID UNI L MOD SED  06/23/2022   IR ANGIO VERTEBRAL SEL VERTEBRAL UNI R MOD SED  05/24/2022   IR ANGIOGRAM FOLLOW UP STUDY  06/21/2022   IR ANGIOGRAM FOLLOW UP STUDY  06/21/2022   IR ANGIOGRAM FOLLOW UP STUDY  06/21/2022   IR NEURO EACH ADD'L AFTER BASIC UNI LEFT (MS)  06/23/2022   IR TRANSCATH/EMBOLIZ  06/21/2022   IR US GUIDE VASC ACCESS RIGHT  05/24/2022   RADIOLOGY WITH  ANESTHESIA N/A 06/21/2022   Procedure: Coil Embolization of LICA Aneurysm;  Surgeon: Lisbeth Renshaw, MD;  Location: [facility] OR;  Service: Radiology;  Laterality: N/A;   TUBAL LIGATION      Social History   Social History   Tobacco Use   Smoking status: Every Day    Packs/day: 1.00    Years: 40.00    Additional pack years: 0.00    Total pack years: 40.00    Types: Cigarettes   Smokeless tobacco: Never   Tobacco comments:    down to 2 cigarettes daily 04/23/20  Vaping Use   Vaping Use: Never used  Substance Use Topics   Alcohol use: Not Currently   Drug use: Never    Medications   Prior to Admission medications   Medication Sig Start Date End Date Taking? Authorizing Provider  acetaminophen (TYLENOL) 500 MG tablet Take 1,000 mg by mouth daily as needed (Pain).   Yes [provider]  ezetimibe (ZETIA) 10 MG tablet Take 10 mg by mouth at bedtime. 05/11/21  Yes [provider]  hydrochlorothiazide (HYDRODIURIL) 12.5 MG tablet Take 12.5 mg by mouth daily. 09/07/17  Yes [provider]  losartan (COZAAR) 100 MG tablet Take 100 mg by mouth at bedtime.   Yes [provider]  ondansetron (ZOFRAN-ODT) 4 MG disintegrating tablet Take 1 tablet (4 mg total) by mouth every 8 (eight) hours as needed for nausea or vomiting. 02/01/22  Yes Mardene Sayer, MD  pantoprazole (PROTONIX) 20 MG  tablet Take 20 mg by mouth 2 (two) times daily. 05/11/21  Yes [provider]  rosuvastatin (CRESTOR) 20 MG tablet Take 20 mg by mouth at bedtime. 05/11/21  Yes [provider]  albuterol (VENTOLIN HFA) 108 (90 Base) MCG/ACT inhaler Inhale 3 puffs into the lungs daily as needed for wheezing or shortness of breath. Patient not taking: Reported on 02/01/2022 04/22/21   [provider]  cephALEXin (KEFLEX) 500 MG capsule Take 1 capsule (500 mg total) by mouth 4 (four) times daily. Patient not taking: Reported on 02/01/2022 12/14/21   Blue, Soijett A, PA-C   clindamycin (CLEOCIN) 300 MG capsule Take 300 mg by mouth every 8 (eight) hours. Patient not taking: Reported on 02/01/2022 12/23/21   [provider]  cyclobenzaprine (FLEXERIL) 10 MG tablet Take 1 tablet (10 mg total) by mouth 2 (two) times daily as needed for muscle spasms. 03/08/22   Carroll Sage, PA-C  cyclobenzaprine (FLEXERIL) 10 MG tablet Take 1 tablet (10 mg total) by mouth 2 (two) times daily as needed for muscle spasms. 04/30/22   Carroll Sage, PA-C  HYDROcodone-acetaminophen (NORCO/VICODIN) 5-325 MG tablet Take 1 tablet by mouth every 4 (four) hours as needed. 02/27/22   Garlon Hatchet, PA-C  lidocaine (LIDODERM) 5 % Place 1 patch onto the skin daily. Remove & Discard patch within 12 hours or as directed by MD Patient not taking: Reported on 02/01/2022 12/10/21   Sponseller, Eugene Gavia, PA-C  methocarbamol (ROBAXIN) 500 MG tablet Take 1 tablet (500 mg total) by mouth 2 (two) times daily. Patient not taking: Reported on 02/01/2022 12/20/21   Melene Plan, DO  naproxen (NAPROSYN) 375 MG tablet Take 1 tablet (375 mg total) by mouth 2 (two) times daily. Patient not taking: Reported on 02/01/2022 01/17/22   Marita Kansas, PA-C  ondansetron (ZOFRAN) 4 MG tablet Take 1 tablet (4 mg total) by mouth every 8 (eight) hours as needed for nausea or vomiting. Patient taking differently: Take 4 mg by mouth daily as needed for nausea or vomiting. 09/09/21   Pricilla Loveless, MD  oxyCODONE (ROXICODONE) 5 MG immediate release tablet Take 1 tablet (5 mg total) by mouth every 6 (six) hours as needed for up to 10 doses for severe pain. 02/01/22   Mardene Sayer, MD  traZODone (DESYREL) 50 MG tablet Take 50 mg by mouth at bedtime. Patient not taking: Reported on 02/01/2022 12/08/21   [provider]  fluticasone (FLONASE) 50 MCG/ACT nasal spray Place 2 sprays into both nostrils daily. Patient not taking: Reported on 12/10/2019 11/04/19 01/31/20  Myra Rude, MD  ipratropium-albuterol  (DUONEB) 0.5-2.5 (3) MG/3ML SOLN Take 3 mLs by nebulization every 6 (six) hours as needed (Wheezing and SOB). Patient not taking: Reported on 12/25/2019 12/11/19 01/31/20  Marguerita Merles Latif, DO    Allergies   Allergies  Allergen Reactions   Lisinopril Cough    Review of Systems  ROS  Neurologic Exam  Awake, alert, oriented Memory and concentration grossly intact Speech fluent, appropriate CN grossly intact Motor exam: Upper Extremities Deltoid Bicep Tricep Grip  Right 5/5 5/5 5/5 5/5  Left 5/5 5/5 5/5 5/5   Lower Extremities IP Quad PF DF EHL  Right 5/5 5/5 5/5 5/5 5/5  Left 5/5 5/5 5/5 5/5 5/5   Sensation grossly intact to LT  Impression  - 62 y.o. female 73mo s/p elective coil embolization of LICA aneurysm  Plan  - Will proceed with diagnostic angiogram  I have reviewed the indications  for the procedure as well as the details of the procedure and the expected postoperative course and recovery at length with the patient in the office. We have also reviewed in detail the risks, benefits, and alternatives to the procedure. All questions were answered and Ashley Hodge provided informed consent to proceed.  Lisbeth Renshaw, MD [facility]

## 2022-11-08 NOTE — Sedation Documentation (Signed)
TR band applied to right radial with 12 cc air at 1102.

## 2022-11-09 DIAGNOSIS — I671 Cerebral aneurysm, nonruptured: Secondary | ICD-10-CM | POA: Diagnosis not present

## 2022-11-09 NOTE — Plan of Care (Signed)

## 2022-11-09 NOTE — Progress Notes (Signed)
Discharge instruction given, patient verbalied understanding, patient discharged, taken to the main entrance A

## 2022-11-09 NOTE — TOC Transition Note (Signed)
Transition of Care [facility]) - CM/SW Discharge Note   Patient Details  Name: Ashley Hodge MRN: [id number] Date of Birth: [birth year]  [facility] of [facility] CM/SW Contact:  Kermit Balo, RN Phone Number: 11/09/2022, 9:59 AM   Clinical Narrative:    Pt is discharging back to her motel. CM provided her with food resources and placed additional information on the AVS.  Pt has transportation home.  SDOH Interventions Today    Flowsheet Row Most Recent Value  SDOH Interventions   Food Insecurity Interventions Inpatient TOC         Final next level of care: Home/Self Care Barriers to Discharge: No Barriers Identified   Patient Goals and CMS Choice      Discharge Placement                         Discharge Plan and Services Additional resources added to the After Visit Summary for                                       Social Determinants of Health (SDOH) Interventions SDOH Screenings   Food Insecurity: Food Insecurity Present (11/08/2022)  Housing: Low Risk  (11/08/2022)  Transportation Needs: No Transportation Needs (11/08/2022)  Utilities: Not At Risk (11/08/2022)  Tobacco Use: High Risk (11/08/2022)     Readmission Risk Interventions     No data to display

## 2022-11-09 NOTE — Discharge Summary (Signed)
  Physician Discharge Summary  Patient ID: PA TENNANT MRN: [id number] DOB/AGE: 1960/07/11 62 y.o.  Admit date: 11/08/2022 Discharge date: 11/09/2022  Admission Diagnoses:  Aneurysm  Discharge Diagnoses:  Same Active Problems:   * No active hospital problems. *   Discharged Condition: Stable  Hospital Course:  Ashley Hodge is a 62 y.o. female admitted after uncomplicated angiogram as the patient has no family to stay with her post-procedure. She was d/c'ed home after overnight stay.  Treatments: Angiogram  Discharge Exam: Blood pressure 130/61, pulse 78, temperature 98.2 F (36.8 C), temperature source Oral, resp. rate 16, height 5' (1.524 m), weight 74.8 kg, SpO2 93 %. Awake, alert, oriented Speech fluent, appropriate CN grossly intact 5/5 BUE/BLE Wound c/d/i  Disposition: Discharge disposition: 01-Home or Self Care       Discharge Instructions     Call MD for:  redness, tenderness, or signs of infection (pain, swelling, redness, odor or green/yellow discharge around incision site)   Complete by: As directed    Diet - low sodium heart healthy   Complete by: As directed    Discharge instructions   Complete by: As directed    Walk at home as much as possible, at least 4 times / day   Increase activity slowly   Complete by: As directed    May shower / Bathe   Complete by: As directed    48 hours after surgery   May walk up steps   Complete by: As directed    No wound care   Complete by: As directed       Allergies as of 11/09/2022       Reactions   Lisinopril Cough        Medication List     TAKE these medications    Advil Dual Action 125-250 MG Tabs Generic drug: Ibuprofen-Acetaminophen Take 1-2 tablets by mouth every 6 (six) hours as needed (pain).   albuterol 108 (90 Base) MCG/ACT inhaler Commonly known as: VENTOLIN HFA Inhale 1-2 puffs into the lungs every 6 (six) hours as needed for wheezing or shortness of breath.   Belsomra 10 MG  Tabs Generic drug: Suvorexant Take 1 tablet by mouth at bedtime.   diazepam 2 MG tablet Commonly known as: VALIUM Take 1 tablet (2 mg total) by mouth every 12 (twelve) hours as needed for anxiety.   ezetimibe 10 MG tablet Commonly known as: ZETIA Take 10 mg by mouth daily.   losartan 100 MG tablet Commonly known as: COZAAR Take 100 mg by mouth at bedtime.   ondansetron 4 MG disintegrating tablet Commonly known as: ZOFRAN-ODT Take 1 tablet (4 mg total) by mouth every 8 (eight) hours as needed for nausea or vomiting.   pantoprazole 20 MG tablet Commonly known as: PROTONIX Take 20 mg by mouth at bedtime.   rosuvastatin 20 MG tablet Commonly known as: CRESTOR Take 20 mg by mouth at bedtime.        Follow-up Information     Lisbeth Renshaw, MD Follow up.   Specialty: Neurosurgery Contact information: [address] [place] [address] [phone]                 Signed: Jackelyn Hoehn 11/09/2022, 7:50 AM

## 2022-11-25 ENCOUNTER — Emergency Department (HOSPITAL_COMMUNITY): Payer: Medicare HMO

## 2022-11-25 ENCOUNTER — Emergency Department (HOSPITAL_COMMUNITY)
Admission: EM | Admit: 2022-11-25 | Discharge: 2022-11-25 | Disposition: A | Discharge status: Home / Self Care | Payer: Medicare HMO | Attending: Emergency Medicine | Admitting: Emergency Medicine

## 2022-11-25 ENCOUNTER — Other Ambulatory Visit: Payer: Self-pay

## 2022-11-25 DIAGNOSIS — X58XXXA Exposure to other specified factors, initial encounter: Secondary | ICD-10-CM | POA: Diagnosis not present

## 2022-11-25 DIAGNOSIS — J45909 Unspecified asthma, uncomplicated: Secondary | ICD-10-CM | POA: Diagnosis not present

## 2022-11-25 DIAGNOSIS — S4991XA Unspecified injury of right shoulder and upper arm, initial encounter: Secondary | ICD-10-CM | POA: Insufficient documentation

## 2022-11-25 DIAGNOSIS — I10 Essential (primary) hypertension: Secondary | ICD-10-CM | POA: Insufficient documentation

## 2022-11-25 DIAGNOSIS — J449 Chronic obstructive pulmonary disease, unspecified: Secondary | ICD-10-CM | POA: Insufficient documentation

## 2022-11-25 DIAGNOSIS — Z79899 Other long term (current) drug therapy: Secondary | ICD-10-CM | POA: Insufficient documentation

## 2022-11-25 MED ORDER — CYCLOBENZAPRINE HCL 10 MG PO TABS
10.0000 mg | ORAL_TABLET | Freq: Once | ORAL | Status: AC
  Administered 2022-11-25: 10 mg via ORAL
  Filled 2022-11-25: qty 1

## 2022-11-25 MED ORDER — CYCLOBENZAPRINE HCL 10 MG PO TABS: 10.0000 mg | ORAL_TABLET | Freq: Three times a day (TID) | ORAL | 0 refills | Status: AC

## 2022-11-25 NOTE — ED Triage Notes (Signed)
Pt reports right shoulder pain x 3 days. Denies injury or trauma

## 2022-11-25 NOTE — ED Provider Notes (Signed)
[place] EMERGENCY DEPARTMENT AT [facility] Provider Note   CSN: [id number] Arrival date & time: 11/25/22  1149     History  Chief Complaint  Patient presents with   Shoulder Pain    Ashley Hodge is a 62 y.o. female.  62 y.o female with a PMH of HTN, Asthma, COPD presents to the ED with a chief complaint of right shoulder pain x 3 days ago. Patient reports she got pulled by her dog, she reports feeling a pulling sensation to the back of her right shoulder with pain radiating down her right arm.  This pain is intermittent, she has taken some Aleve with mild improvement in symptoms.  She feels like she "pulled a muscle", this occurred over the weekend and has continued to worsen. There is no associated chest pain, no shortness of breath, no other complaints. No fever.   The history is provided by the patient.  Shoulder Pain Associated symptoms: no fever        Home Medications Prior to Admission medications   Medication Sig Start Date End Date Taking? Authorizing Provider  cyclobenzaprine (FLEXERIL) 10 MG tablet Take 1 tablet (10 mg total) by mouth 3 (three) times daily for 7 days. 11/25/22 12/02/22 Yes Ichelle Harral, PA-C  albuterol (VENTOLIN HFA) 108 (90 Base) MCG/ACT inhaler Inhale 1-2 puffs into the lungs every 6 (six) hours as needed for wheezing or shortness of breath.    [provider]  BELSOMRA 10 MG TABS Take 1 tablet by mouth at bedtime. 06/07/22   [provider]  diazepam (VALIUM) 2 MG tablet Take 1 tablet (2 mg total) by mouth every 12 (twelve) hours as needed for anxiety. 10/12/22   Roxy Horseman, PA-C  ezetimibe (ZETIA) 10 MG tablet Take 10 mg by mouth daily.    [provider]  Ibuprofen-Acetaminophen (ADVIL DUAL ACTION) 125-250 MG TABS Take 1-2 tablets by mouth every 6 (six) hours as needed (pain).    [provider]  losartan (COZAAR) 100 MG tablet Take 100 mg by mouth at bedtime.    [provider]   ondansetron (ZOFRAN-ODT) 4 MG disintegrating tablet Take 1 tablet (4 mg total) by mouth every 8 (eight) hours as needed for nausea or vomiting. 02/01/22   Mardene Sayer, MD  pantoprazole (PROTONIX) 20 MG tablet Take 20 mg by mouth at bedtime. 05/11/21   [provider]  rosuvastatin (CRESTOR) 20 MG tablet Take 20 mg by mouth at bedtime. 05/11/21   [provider]  fluticasone (FLONASE) 50 MCG/ACT nasal spray Place 2 sprays into both nostrils daily. Patient not taking: Reported on 12/10/2019 11/04/19 01/31/20  Myra Rude, MD  ipratropium-albuterol (DUONEB) 0.5-2.5 (3) MG/3ML SOLN Take 3 mLs by nebulization every 6 (six) hours as needed (Wheezing and SOB). Patient not taking: Reported on 12/25/2019 12/11/19 01/31/20  Marguerita Merles Latif, DO      Allergies    Lisinopril    Review of Systems   Review of Systems  Constitutional:  Negative for fever.  Musculoskeletal:  Positive for arthralgias.    Physical Exam Updated Vital Signs BP (!) 159/79 (BP Location: Left Arm)   Pulse (!) 106   Temp 98.6 F (37 C) (Oral)   Resp 18   Ht 5' (1.524 m)   Wt 75 kg   SpO2 98%   BMI 32.29 kg/m  Physical Exam Vitals and nursing note reviewed.  Constitutional:      Appearance: Normal appearance.  HENT:  Head: Normocephalic and atraumatic.     Mouth/Throat:     Mouth: Mucous membranes are moist.  Cardiovascular:     Rate and Rhythm: Normal rate.  Pulmonary:     Effort: Pulmonary effort is normal.  Abdominal:     General: Abdomen is flat.  Musculoskeletal:     Right shoulder: Tenderness present. No swelling, deformity, bony tenderness or crepitus. Normal range of motion. Normal strength. Normal pulse.     Cervical back: Normal range of motion and neck supple.     Comments: Right shoulder ttp, worsen with abduction. 2+ radial pulse, no hematoma or changes in the skin.   Skin:    General: Skin is warm and dry.  Neurological:     Mental Status: She is alert and  oriented to person, place, and time.     ED Results / Procedures / Treatments   Labs (all labs ordered are listed, but only abnormal results are displayed) Labs Reviewed - No data to display  EKG None  Radiology DG Shoulder Right  Result Date: 11/25/2022 CLINICAL DATA:  Right shoulder pain for 3-4 days. EXAM: RIGHT SHOULDER - 2+ VIEW COMPARISON:  None Available. FINDINGS: There is no acute fracture or dislocation. Bony alignment is normal. The joint spaces are preserved, without significant degenerative change. There is no erosive change. The soft tissues are unremarkable. IMPRESSION: Negative. Electronically Signed   By: Lesia Hausen M.D.   On: 11/25/2022 13:03    Procedures Procedures    Medications Ordered in ED Medications  cyclobenzaprine (FLEXERIL) tablet 10 mg (has no administration in time range)    ED Course/ Medical Decision Making/ A&P                             Medical Decision Making Amount and/or Complexity of Data Reviewed Radiology: ordered.  Risk Prescription drug management.   Patient here with right shoulder pain for the past 3 days exacerbated with abduction, some improvement with over-the-counter medication.  No chest pain, no shortness of breath, no fevers.  X-ray of the right shoulder did not show any signs of arthritis, no other acute fracture or dislocation noted.  Pain is along the palpable right papula tracking down, exacerbated with abduction, suspect MSK etiology.  She does not have any headache, no tingling or numbness, or similar symptoms with her prior aneurysm.  We did discuss this and she was cleared by neurosurgery on July 3.  I discussed a short course of muscle relaxers to help with improvement, will also attempt RICE therapy.  Patient is agreeable to plan and treatment, stable for discharge.  Portions of this note were generated with Scientist, clinical (histocompatibility and immunogenetics). Dictation errors may occur despite best attempts at proofreading.   Final  Clinical Impression(s) / ED Diagnoses Final diagnoses:  Injury of right shoulder, initial encounter    Rx / DC Orders ED Discharge Orders          Ordered    cyclobenzaprine (FLEXERIL) 10 MG tablet  3 times daily        11/25/22 1327              Claude Manges, PA-C 11/25/22 1338    Tegeler, Canary Brim, MD 11/25/22 1630

## 2022-11-25 NOTE — Discharge Instructions (Addendum)
I have prescribed a short course of muscle relaxers to help with your pain, please take 1 tablet 3 times a day for the next 7 days.  Please be aware this medication can make you drowsy, do not drink alcohol or drive while taking this medication.  If you experience any worsening symptoms please return to the emergency department.

## 2022-12-06 ENCOUNTER — Emergency Department (HOSPITAL_COMMUNITY)
Admission: EM | Admit: 2022-12-06 | Discharge: 2022-12-06 | Disposition: A | Discharge status: Home / Self Care | Payer: Medicare HMO | Attending: Emergency Medicine | Admitting: Emergency Medicine

## 2022-12-06 ENCOUNTER — Other Ambulatory Visit: Payer: Self-pay

## 2022-12-06 DIAGNOSIS — W228XXA Striking against or struck by other objects, initial encounter: Secondary | ICD-10-CM | POA: Insufficient documentation

## 2022-12-06 DIAGNOSIS — I10 Essential (primary) hypertension: Secondary | ICD-10-CM | POA: Diagnosis not present

## 2022-12-06 DIAGNOSIS — S90511A Abrasion, right ankle, initial encounter: Secondary | ICD-10-CM | POA: Insufficient documentation

## 2022-12-06 DIAGNOSIS — M25571 Pain in right ankle and joints of right foot: Secondary | ICD-10-CM | POA: Diagnosis present

## 2022-12-06 DIAGNOSIS — Z79899 Other long term (current) drug therapy: Secondary | ICD-10-CM | POA: Diagnosis not present

## 2022-12-06 MED ORDER — DOXYCYCLINE HYCLATE 100 MG PO CAPS: 100.0000 mg | ORAL_CAPSULE | Freq: Two times a day (BID) | ORAL | 0 refills | Status: AC

## 2022-12-06 MED ORDER — BACITRACIN ZINC 500 UNIT/GM EX OINT
TOPICAL_OINTMENT | Freq: Two times a day (BID) | CUTANEOUS | Status: DC
  Filled 2022-12-06: qty 0.9

## 2022-12-06 NOTE — Discharge Instructions (Signed)
You have been prescribed doxycyline. Take this antibiotic 2 times a day for the next 7 days. Take the full course of your antibiotic even if you start feeling better. Antibiotics may cause you to have diarrhea. This antibiotic may cause to have some sensitivity to the sun. Cover up while in the sun and use sunscreen.   Keep your wound clean and cover with an antibiotic ointment such as neosporin or bacitracin, then cover with sterile gauze.  Return to the ER should you develop fever, chills, pus drainage from your wound, redness around your wound.

## 2022-12-06 NOTE — ED Provider Notes (Signed)
[place] EMERGENCY DEPARTMENT AT [facility] Provider Note   CSN: [id number] Arrival date & time: 12/06/22  1206     History  Chief Complaint  Patient presents with   rt leg injury    Ashley Hodge is a 62 y.o. female with HTN, prediabetes, who presents with concern for a wound on her right ankle. Her dog's leash wrapped around her ankle 5 days ago and caused an abrasion. She notes some pain and swelling of her ankle. She denies any fevers, chills, pus drainage, spreading redness around the wound. Has tried neosporin on the area.   Triage noted she requested covid testing but she denies wanting a covid test currently. Denies any sick symptoms.   HPI     Home Medications Prior to Admission medications   Medication Sig Start Date End Date Taking? Authorizing Provider  doxycycline (VIBRAMYCIN) 100 MG capsule Take 1 capsule (100 mg total) by mouth 2 (two) times daily for 7 days. 12/06/22 12/13/22 Yes Arabella Merles, PA-C  albuterol (VENTOLIN HFA) 108 (90 Base) MCG/ACT inhaler Inhale 1-2 puffs into the lungs every 6 (six) hours as needed for wheezing or shortness of breath.    [provider]  BELSOMRA 10 MG TABS Take 1 tablet by mouth at bedtime. 06/07/22   [provider]  diazepam (VALIUM) 2 MG tablet Take 1 tablet (2 mg total) by mouth every 12 (twelve) hours as needed for anxiety. 10/12/22   Roxy Horseman, PA-C  ezetimibe (ZETIA) 10 MG tablet Take 10 mg by mouth daily.    [provider]  Ibuprofen-Acetaminophen (ADVIL DUAL ACTION) 125-250 MG TABS Take 1-2 tablets by mouth every 6 (six) hours as needed (pain).    [provider]  losartan (COZAAR) 100 MG tablet Take 100 mg by mouth at bedtime.    [provider]  ondansetron (ZOFRAN-ODT) 4 MG disintegrating tablet Take 1 tablet (4 mg total) by mouth every 8 (eight) hours as needed for nausea or vomiting. 02/01/22   Mardene Sayer, MD  pantoprazole (PROTONIX) 20 MG  tablet Take 20 mg by mouth at bedtime. 05/11/21   [provider]  rosuvastatin (CRESTOR) 20 MG tablet Take 20 mg by mouth at bedtime. 05/11/21   [provider]  fluticasone (FLONASE) 50 MCG/ACT nasal spray Place 2 sprays into both nostrils daily. Patient not taking: Reported on 12/10/2019 11/04/19 01/31/20  Myra Rude, MD  ipratropium-albuterol (DUONEB) 0.5-2.5 (3) MG/3ML SOLN Take 3 mLs by nebulization every 6 (six) hours as needed (Wheezing and SOB). Patient not taking: Reported on 12/25/2019 12/11/19 01/31/20  Marguerita Merles Latif, DO      Allergies    Lisinopril    Review of Systems   Review of Systems  Skin:  Positive for wound.    Physical Exam Updated Vital Signs BP (!) 150/78 (BP Location: Left Arm)   Pulse 90   Temp 98.3 F (36.8 C) (Oral)   Resp 16   Ht 5' (1.524 m)   Wt 74.8 kg   SpO2 96%   BMI 32.22 kg/m  Physical Exam Vitals and nursing note reviewed.  Constitutional:      Appearance: Normal appearance.  HENT:     Head: Atraumatic.  Cardiovascular:     Rate and Rhythm: Normal rate and regular rhythm.     Comments: Dorsalis pedis and posterior tibialis pulses 2+ bilaterally Pulmonary:     Effort: Pulmonary effort is normal. No respiratory distress.     Breath sounds: Normal  breath sounds.  Skin:    Comments: Abrasion to the right anterior ankle approximately 5cm in diameter, scabbed over. No pus drainage. Mild surrounding erythema. Mild edema of the ankle.  Full ankle ROM, non tender to palpation diffusely  Neurological:     General: No focal deficit present.     Mental Status: She is alert.  Psychiatric:        Mood and Affect: Mood normal.        Behavior: Behavior normal.     ED Results / Procedures / Treatments   Labs (all labs ordered are listed, but only abnormal results are displayed) Labs Reviewed - No data to display  EKG None  Radiology No results found.  Procedures Procedures    Medications Ordered in  ED Medications  bacitracin ointment ( Topical Given 12/06/22 1603)    ED Course/ Medical Decision Making/ A&P                             Medical Decision Making Risk OTC drugs. Prescription drug management.   62 y.o. female with pertinent past medical history of HTN, prediabetes presents to the ED for concern of abrasion to her right ankle   Differential diagnosis includes but is not limited to abrasion, cellulitis  ED Course:  Patient with abrasion to the right anterior ankle, it is scabbed over with no pus drainage. Mild edema and erythema surrounding the abrasion. Full ankle ROM and ankle non-tender to palpation diffusely. Patient vital signs are stable aside from a slightly elevated BP consistent with history of HTN.  Patient abrasion dressed with bacitracin ointment and sterile gauze dressing before leaving. She has been prescribed a 7 day course of doxycycline to take for possible cellulitis, as she is more prone to infection due to pre-diabetes.    Impression: Abrasion of the right ankle Possible cellulitis   Disposition:  The patient was discharged home with instructions to take 7 day course of doxycycline. Instructed on keeping her abrasion clean and covered with antibiotic ointment. Return precautions given.  Co morbidities that complicate the patient evaluation  Prediabetes, HTN  Social Determinants of Health:  Unknown              Final Clinical Impression(s) / ED Diagnoses Final diagnoses:  Abrasion, right ankle, initial encounter    Rx / DC Orders ED Discharge Orders          Ordered    doxycycline (VIBRAMYCIN) 100 MG capsule  2 times daily        12/06/22 1622              Arabella Merles, PA-C 12/06/22 1925    Charlynne Pander, MD 12/06/22 [phone]

## 2022-12-06 NOTE — ED Triage Notes (Signed)
Pt reports rt leg injury from dog leash getting wrapped around her leg Friday, presents with redness, swelling, and scabbing to rt lower leg. Pt also requesting covid test due to recent cough, runny nose, and fatigue.

## 2023-05-15 ENCOUNTER — Other Ambulatory Visit: Payer: Self-pay

## 2023-05-15 ENCOUNTER — Emergency Department (HOSPITAL_COMMUNITY)
Admission: EM | Admit: 2023-05-15 | Discharge: 2023-05-16 | Disposition: A | Discharge status: Home / Self Care | Payer: Medicare HMO | Attending: Emergency Medicine | Admitting: Emergency Medicine

## 2023-05-15 ENCOUNTER — Encounter (HOSPITAL_COMMUNITY): Payer: Self-pay

## 2023-05-15 DIAGNOSIS — Z20822 Contact with and (suspected) exposure to covid-19: Secondary | ICD-10-CM | POA: Insufficient documentation

## 2023-05-15 DIAGNOSIS — M791 Myalgia, unspecified site: Secondary | ICD-10-CM | POA: Insufficient documentation

## 2023-05-15 DIAGNOSIS — R051 Acute cough: Secondary | ICD-10-CM | POA: Insufficient documentation

## 2023-05-15 LAB — RESP PANEL BY RT-PCR (RSV, FLU A&B, COVID)  RVPGX2
Influenza A by PCR: NEGATIVE
Influenza B by PCR: NEGATIVE
Resp Syncytial Virus by PCR: NEGATIVE
SARS Coronavirus 2 by RT PCR: NEGATIVE

## 2023-05-15 MED ORDER — ACETAMINOPHEN 325 MG PO TABS
650.0000 mg | ORAL_TABLET | Freq: Once | ORAL | Status: AC
  Administered 2023-05-15: 650 mg via ORAL
  Filled 2023-05-15: qty 2

## 2023-05-15 NOTE — ED Triage Notes (Signed)
Pt reports generalized body aches, cough and congestion x 2-3 days.

## 2023-05-16 ENCOUNTER — Emergency Department (HOSPITAL_COMMUNITY): Payer: Medicare HMO

## 2023-05-16 DIAGNOSIS — R051 Acute cough: Secondary | ICD-10-CM | POA: Diagnosis not present

## 2023-05-16 MED ORDER — SALINE SPRAY 0.65 % NA SOLN: 1.0000 | NASAL | 0 refills | Status: AC | PRN

## 2023-05-16 MED ORDER — ONDANSETRON 4 MG PO TBDP: 4.0000 mg | ORAL_TABLET | Freq: Three times a day (TID) | ORAL | 0 refills | Status: AC | PRN

## 2023-05-16 MED ORDER — ONDANSETRON 4 MG PO TBDP
4.0000 mg | ORAL_TABLET | Freq: Once | ORAL | Status: AC
  Administered 2023-05-16: 4 mg via ORAL
  Filled 2023-05-16: qty 1

## 2023-05-16 MED ORDER — BENZONATATE 100 MG PO CAPS: 100.0000 mg | ORAL_CAPSULE | Freq: Three times a day (TID) | ORAL | 0 refills | Status: AC

## 2023-05-16 MED ORDER — AZITHROMYCIN 250 MG PO TABS
250.0000 mg | ORAL_TABLET | Freq: Every day | ORAL | 0 refills | Status: AC
Start: 2023-05-16 — End: ?

## 2023-05-16 MED ORDER — ALBUTEROL SULFATE HFA 108 (90 BASE) MCG/ACT IN AERS: 2.0000 | INHALATION_SPRAY | RESPIRATORY_TRACT | 3 refills | Status: AC | PRN

## 2023-05-16 NOTE — ED Provider Notes (Signed)
 [facility]-EMERGENCY DEPT [facility] Emergency Department Provider Note MRN:  [id number]  Arrival date & time: 05/16/23     Chief Complaint   Cough   History of Present Illness   Ashley Hodge is a 61 y.o. year-old female presents to the ED with chief complaint of cough and some generalized body aches for the past 3 days.  She also reports feeling congested.  She thinks that the rapid changes in temperature outside have triggered her symptoms.  She has tried some OTC nasal sprays as well as OTC cough medicine.  She denies fever.  Denies any other associated symptoms.  History provided by patient.   Review of Systems  Pertinent positive and negative review of systems noted in HPI.    Physical Exam   Vitals:   05/16/23 0003 05/16/23 0317  BP: 129/68 130/68  Pulse: 91 90  Resp: 18 18  Temp: 98.7 F (37.1 C) 98.3 F (36.8 C)  SpO2: 95% 91%    CONSTITUTIONAL:  well-appearing, NAD NEURO:  Alert and oriented x 3, CN 3-12 grossly intact EYES:  eyes equal and reactive ENT/NECK:  Supple, no stridor  CARDIO:  normal rate, regular rhythm, appears well-perfused  PULM:  No respiratory distress,  GI/GU:  non-distended,  MSK/SPINE:  No gross deformities, no edema, moves all extremities  SKIN:  no rash, atraumatic   *Additional and/or pertinent findings included in MDM below  Diagnostic and Interventional Summary    EKG Interpretation Date/Time:    Ventricular Rate:    PR Interval:    QRS Duration:    QT Interval:    QTC Calculation:   R Axis:      Text Interpretation:         Labs Reviewed  RESP PANEL BY RT-PCR (RSV, FLU A&B, COVID)  [id number]    DG Chest [place] 1 View  Final Result      Medications  acetaminophen  (TYLENOL ) tablet 650 mg (650 mg Oral Given 05/15/23 2019)  ondansetron  (ZOFRAN -ODT) disintegrating tablet 4 mg (4 mg Oral Given 05/16/23 0453)     Procedures  /  Critical Care Procedures  ED Course and Medical Decision Making  I have reviewed the  triage vital signs, the nursing notes, and pertinent available records from the EMR.  Social Determinants Affecting Complexity of Care: Patient has no clinically significant social determinants affecting this chief complaint..   ED Course: Clinical Course as of 05/16/23 0630  Tue May 16, 2023  0630 DG Chest [place] 1 View No obvious opacity seen [RB]  0630 Resp panel by RT-PCR (RSV, Flu A&B, Covid) Anterior Nasal Swab Negative covid and flu [RB]    Clinical Course User Index [RB] Vicky Charleston, PA-C    Medical Decision Making Patient here with cough and congestion for the past few days.  She hasn't had fever.  Has tried OTC cough medicine and some nasal sprays.   Looks well.  Asks for a refill of her zofran  and her inhaler.  Given smoking hx and some wheezing, will give her a z-pak too.    Feel that she is stable for discharge home.  Amount and/or Complexity of Data Reviewed Radiology: ordered.  Risk OTC drugs. Prescription drug management.         Consultants: No consultations were needed in caring for this patient.   Treatment and Plan: Emergency department workup does not suggest an emergent condition requiring admission or immediate intervention beyond  what has been performed at this time. The patient is safe  for discharge and has  been instructed to return immediately for worsening symptoms, change in  symptoms or any other concerns    Final Clinical Impressions(s) / ED Diagnoses     ICD-10-CM   1. Acute cough  R05.1       ED Discharge Orders          Ordered    albuterol  (VENTOLIN  HFA) 108 (90 Base) MCG/ACT inhaler  Every 4 hours PRN        05/16/23 0628    azithromycin  (ZITHROMAX ) 250 MG tablet  Daily        05/16/23 0628    benzonatate  (TESSALON ) 100 MG capsule  Every 8 hours        05/16/23 0628    ondansetron  (ZOFRAN -ODT) 4 MG disintegrating tablet  Every 8 hours PRN        05/16/23 0628    sodium chloride  (OCEAN) 0.65 % SOLN nasal spray  As  needed        05/16/23 9371              Discharge Instructions Discussed with and Provided to Patient:   Discharge Instructions   None      Vicky Charleston, PA-C 05/16/23 0630    Haze Lonni PARAS, MD 05/17/23 [phone]

## 2023-05-16 NOTE — ED Notes (Signed)
 Patient transported to X-ray

## 2023-06-09 ENCOUNTER — Emergency Department (HOSPITAL_COMMUNITY)
Admission: EM | Admit: 2023-06-09 | Discharge: 2023-06-10 | Disposition: A | Discharge status: Home / Self Care | Payer: Medicare HMO | Attending: Student | Admitting: Student

## 2023-06-09 DIAGNOSIS — M545 Low back pain, unspecified: Secondary | ICD-10-CM | POA: Insufficient documentation

## 2023-06-09 DIAGNOSIS — J449 Chronic obstructive pulmonary disease, unspecified: Secondary | ICD-10-CM | POA: Diagnosis not present

## 2023-06-09 DIAGNOSIS — Z79899 Other long term (current) drug therapy: Secondary | ICD-10-CM | POA: Insufficient documentation

## 2023-06-09 DIAGNOSIS — F17210 Nicotine dependence, cigarettes, uncomplicated: Secondary | ICD-10-CM | POA: Insufficient documentation

## 2023-06-09 DIAGNOSIS — I10 Essential (primary) hypertension: Secondary | ICD-10-CM | POA: Diagnosis not present

## 2023-06-09 MED ORDER — METHOCARBAMOL 500 MG PO TABS: 500.0000 mg | ORAL_TABLET | Freq: Two times a day (BID) | ORAL | 0 refills | Status: AC

## 2023-06-09 MED ORDER — ONDANSETRON HCL 4 MG PO TABS: 4.0000 mg | ORAL_TABLET | Freq: Three times a day (TID) | ORAL | 0 refills | Status: AC | PRN

## 2023-06-09 MED ORDER — KETOROLAC TROMETHAMINE 15 MG/ML IJ SOLN
15.0000 mg | Freq: Once | INTRAMUSCULAR | Status: AC
  Administered 2023-06-09: 15 mg via INTRAMUSCULAR
  Filled 2023-06-09: qty 1

## 2023-06-09 MED ORDER — MELOXICAM 15 MG PO TABS: 15.0000 mg | ORAL_TABLET | Freq: Every day | ORAL | 0 refills | Status: AC

## 2023-06-09 MED ORDER — METHYLPREDNISOLONE 4 MG PO TBPK: ORAL_TABLET | ORAL | 0 refills | Status: AC

## 2023-06-09 NOTE — ED Notes (Signed)
Called in lobby; no answer x1

## 2023-06-09 NOTE — ED Triage Notes (Signed)
Pt here from home with c/o low back pain , was seen for same in [place] and was given prednisone but stopped taken it

## 2023-06-09 NOTE — ED Provider Notes (Signed)
[place] EMERGENCY DEPARTMENT AT [facility] Provider Note   CSN: [id number] Arrival date & time: 06/09/23  1825     History  Chief Complaint  Patient presents with   Back Pain    Ashley Hodge is a 63 y.o. female.  Patient is a 63 year old female that presents for low back pain that has been present for approximately 10 days.  She uses a motorized wheelchair at baseline but is able to ambulate short distances without assist.  She was seen at an outside emergency department on January 16 for the same complaint.  She was prescribed methocarbamol and a Medrol Dosepak.  She states that after going home and taking the medicine she noticed that her blood pressure increased and stopped taking both medications after 2 days.  She denies numbness, paresthesias, urinary or fecal incontinence, leg weakness,fever, hematuria, dysuria and changes in urinary frequency.    Back Pain      Home Medications Prior to Admission medications   Medication Sig Start Date End Date Taking? Authorizing Provider  meloxicam (MOBIC) 15 MG tablet Take 1 tablet (15 mg total) by mouth daily. 06/09/23  Yes Darrick Grinder, PA-C  methocarbamol (ROBAXIN) 500 MG tablet Take 1 tablet (500 mg total) by mouth 2 (two) times daily. 06/09/23  Yes Darrick Grinder, PA-C  methylPREDNISolone (MEDROL DOSEPAK) 4 MG TBPK tablet Take as directed per package instructions 06/09/23  Yes Darrick Grinder, PA-C  ondansetron (ZOFRAN) 4 MG tablet Take 1 tablet (4 mg total) by mouth every 8 (eight) hours as needed for nausea or vomiting. 06/09/23  Yes Darrick Grinder, PA-C  albuterol (VENTOLIN HFA) 108 (90 Base) MCG/ACT inhaler Inhale 1-2 puffs into the lungs every 6 (six) hours as needed for wheezing or shortness of breath.    [provider]  albuterol (VENTOLIN HFA) 108 (90 Base) MCG/ACT inhaler Inhale 2 puffs into the lungs every 4 (four) hours as needed for wheezing or shortness of breath. 05/16/23   Roxy Horseman, PA-C  azithromycin (ZITHROMAX) 250 MG tablet Take 1 tablet (250 mg total) by mouth daily. Take first 2 tablets together, then 1 every day until finished. 05/16/23   Roxy Horseman, PA-C  BELSOMRA 10 MG TABS Take 1 tablet by mouth at bedtime. 06/07/22   [provider]  benzonatate (TESSALON) 100 MG capsule Take 1 capsule (100 mg total) by mouth every 8 (eight) hours. 05/16/23   Roxy Horseman, PA-C  diazepam (VALIUM) 2 MG tablet Take 1 tablet (2 mg total) by mouth every 12 (twelve) hours as needed for anxiety. 10/12/22   Roxy Horseman, PA-C  ezetimibe (ZETIA) 10 MG tablet Take 10 mg by mouth daily.    [provider]  Ibuprofen-Acetaminophen (ADVIL DUAL ACTION) 125-250 MG TABS Take 1-2 tablets by mouth every 6 (six) hours as needed (pain).    [provider]  losartan (COZAAR) 100 MG tablet Take 100 mg by mouth at bedtime.    [provider]  ondansetron (ZOFRAN-ODT) 4 MG disintegrating tablet Take 1 tablet (4 mg total) by mouth every 8 (eight) hours as needed for nausea or vomiting. 05/16/23   Roxy Horseman, PA-C  pantoprazole (PROTONIX) 20 MG tablet Take 20 mg by mouth at bedtime. 05/11/21   [provider]  rosuvastatin (CRESTOR) 20 MG tablet Take 20 mg by mouth at bedtime. 05/11/21   [provider]  sodium chloride (OCEAN) 0.65 % SOLN nasal spray Place 1 spray into both nostrils as needed  for congestion. 05/16/23   Roxy Horseman, PA-C  fluticasone (FLONASE) 50 MCG/ACT nasal spray Place 2 sprays into both nostrils daily. Patient not taking: Reported on 12/10/2019 11/04/19 01/31/20  Myra Rude, MD  ipratropium-albuterol (DUONEB) 0.5-2.5 (3) MG/3ML SOLN Take 3 mLs by nebulization every 6 (six) hours as needed (Wheezing and SOB). Patient not taking: Reported on 12/25/2019 12/11/19 01/31/20  Marguerita Merles Latif, DO      Allergies    Lisinopril    Review of Systems   Review of Systems  Musculoskeletal:  Positive for back  pain.    Physical Exam Updated Vital Signs BP (!) 146/77 (BP Location: Left Arm)   Pulse (!) 121   Temp 97.8 F (36.6 C) (Oral)   Resp 18   SpO2 98%  Physical Exam Vitals and nursing note reviewed.  HENT:     Head: Normocephalic and atraumatic.  Eyes:     Conjunctiva/sclera: Conjunctivae normal.  Cardiovascular:     Rate and Rhythm: Normal rate.  Pulmonary:     Effort: Pulmonary effort is normal. No respiratory distress.  Musculoskeletal:        General: Tenderness present. No signs of injury.     Cervical back: Normal range of motion.     Comments: Bilateral lumbar tenderness with no midline tenderness  Skin:    General: Skin is dry.  Neurological:     General: No focal deficit present.     Mental Status: She is alert.  Psychiatric:        Speech: Speech normal.        Behavior: Behavior normal.     ED Results / Procedures / Treatments   Labs (all labs ordered are listed, but only abnormal results are displayed) Labs Reviewed - No data to display  EKG None  Radiology No results found.  Procedures Procedures    Medications Ordered in ED Medications  ketorolac (TORADOL) 15 MG/ML injection 15 mg (15 mg Intramuscular Given 06/09/23 2238)    ED Course/ Medical Decision Making/ A&P                                 Medical Decision Making Risk Prescription drug management.   This patient presents to the ED for concern of back pain, this involves an extensive number of treatment options, and is a complaint that carries with it a high risk of complications and morbidity.  The differential diagnosis includes muscle strain, fracture, dislocation, cauda equina, others   Co morbidities that complicate the patient evaluation  Hypertension, COPD, prediabetes, dyspnea on exertion   Additional history obtained:   External records from outside source obtained and reviewed including lumbar films and labs from 06/01/23 from [place].  No acute findings on lumbar  films.  Labs grossly unremarkable    Imaging Studies ordered:  No red flag symptoms such as saddle anesthesia, urinary incontinence, fecal incontinence, urinary retention.  No new trauma.  No indication for spinal imaging at this time   Problem List / ED Course / Critical interventions / Medication management   I ordered medication including Toradol for back pain Reevaluation of the patient after these medicines showed that the patient improved I have reviewed the patients home medicines and have made adjustments as needed   Social Determinants of Health:  Patient is a daily tobacco smoker   Test / Admission - Considered:  Patient presentation consistent with musculoskeletal lumbar pain.  No trauma  to suggest fracture or dislocation.  Patient had negative imaging on January 16 for the same complaint.  I see no occasion at this time for labs as patient has no new symptoms.  I did explain side effects of steroids including possible transient hypertension.  Patient voiced understanding.  Discussed multiple treatment options with patient and plan to discharge home with prescription for Medrol Dosepak and Robaxin prescription. Patient also provided with  Patient treated with Toradol here in the emergency department.  Patient plans to follow-up with her primary care in the [place] area.  Discharge home.         Final Clinical Impression(s) / ED Diagnoses Final diagnoses:  Bilateral low back pain without sciatica, unspecified chronicity    Rx / DC Orders ED Discharge Orders          Ordered    ondansetron (ZOFRAN) 4 MG tablet  Every 8 hours PRN        06/09/23 2236    methocarbamol (ROBAXIN) 500 MG tablet  2 times daily        06/09/23 2236    meloxicam (MOBIC) 15 MG tablet  Daily        06/09/23 2236    methylPREDNISolone (MEDROL DOSEPAK) 4 MG TBPK tablet        06/09/23 2236              Pamala Duffel 06/09/23 2246    Durwin Glaze,  MD 06/09/23 [phone]

## 2023-06-09 NOTE — Discharge Instructions (Addendum)
You were seen tonight for back pain. I have prescribed meloxicam which to be taken once daily.  Do not take other NSAID medications while taking the medication.  This is an anti-inflammatory medication.  You have been given a Medrol Dosepak which is a steroid.  Please take this until complete.  This may raise your blood pressure temporarily while you are taking the medication.  I also prescribed methocarbamol (Robaxin) which is a muscle relaxant.  Please be aware this would likely cause drowsiness and you should not take if you are planning to drive or operate machinery.  I also refilled your Zofran.  If you develop any life-threatening symptoms return to the emergency department.

## 2023-06-09 NOTE — ED Notes (Signed)
Pt is in a motorized chair but states that she can walk short distances

## 2023-08-11 ENCOUNTER — Emergency Department (HOSPITAL_COMMUNITY)
Admission: EM | Admit: 2023-08-11 | Discharge: 2023-08-11 | Disposition: A | Discharge status: Home / Self Care | Attending: Emergency Medicine | Admitting: Emergency Medicine

## 2023-08-11 ENCOUNTER — Other Ambulatory Visit: Payer: Self-pay

## 2023-08-11 ENCOUNTER — Encounter (HOSPITAL_COMMUNITY): Payer: Self-pay | Admitting: Emergency Medicine

## 2023-08-11 DIAGNOSIS — M25512 Pain in left shoulder: Secondary | ICD-10-CM | POA: Insufficient documentation

## 2023-08-11 DIAGNOSIS — R519 Headache, unspecified: Secondary | ICD-10-CM | POA: Diagnosis not present

## 2023-08-11 DIAGNOSIS — G8929 Other chronic pain: Secondary | ICD-10-CM | POA: Insufficient documentation

## 2023-08-11 MED ORDER — ONDANSETRON HCL 4 MG PO TABS: 4.0000 mg | ORAL_TABLET | Freq: Four times a day (QID) | ORAL | 0 refills | Status: AC

## 2023-08-11 MED ORDER — KETOROLAC TROMETHAMINE 15 MG/ML IJ SOLN
15.0000 mg | Freq: Once | INTRAMUSCULAR | Status: AC
  Administered 2023-08-11: 15 mg via INTRAMUSCULAR
  Filled 2023-08-11: qty 1

## 2023-08-11 MED ORDER — ONDANSETRON HCL 4 MG PO TABS: 4.0000 mg | ORAL_TABLET | Freq: Four times a day (QID) | ORAL | 0 refills | Status: DC

## 2023-08-11 NOTE — Discharge Instructions (Addendum)
 Return for any problem.  ?

## 2023-08-11 NOTE — ED Provider Notes (Signed)
 [place] EMERGENCY DEPARTMENT AT [facility] Provider Note   CSN: [id number] Arrival date & time: 08/11/23  2119     History  Chief Complaint  Patient presents with   Headache    Ashley Hodge is a 63 y.o. female.  63 year old female with prior medical history as detailed below presents for evaluation.  Patient admits to anxiety regarding reported history of cerebral aneurysm.  She reports that 1 cerebral aneurysm requiring coiling with Dr. Conchita Paris.  She reports that since this event she has had constant anxiety about possibility that her second aneurysm may have ruptured.  She apparently was seen earlier this month at [facility] for evaluation.  She had a CT head that was negative.  She also complained at that time of chronic left shoulder pain.  Imaging of the left shoulder was negative.  It appears that she left prior to completion of evaluation over at [place] earliest month.  She comes to the ED here at [facility] asking for interpretation of the imaging studies performed on the 11th at [place].  She reports that she is concerned that they "might have messed something up".  She is without acute complaint.  She admits to anxiety regarding her prior medical history.  She denies current headache, nausea, vomiting, visual change.  She admits that her left shoulder bothers her "almost every day".  She has tried multiple different OTC medications for her left shoulder pain.  She has had prior shoulder injections.  She has not seen orthopedics in the last 4 months.  The history is provided by the patient.       Home Medications Prior to Admission medications   Medication Sig Start Date End Date Taking? Authorizing Provider  albuterol (VENTOLIN HFA) 108 (90 Base) MCG/ACT inhaler Inhale 1-2 puffs into the lungs every 6 (six) hours as needed for wheezing or shortness of breath.    [provider]  albuterol (VENTOLIN HFA) 108 (90 Base) MCG/ACT inhaler  Inhale 2 puffs into the lungs every 4 (four) hours as needed for wheezing or shortness of breath. 05/16/23   Roxy Horseman, PA-C  azithromycin (ZITHROMAX) 250 MG tablet Take 1 tablet (250 mg total) by mouth daily. Take first 2 tablets together, then 1 every day until finished. 05/16/23   Roxy Horseman, PA-C  BELSOMRA 10 MG TABS Take 1 tablet by mouth at bedtime. 06/07/22   [provider]  benzonatate (TESSALON) 100 MG capsule Take 1 capsule (100 mg total) by mouth every 8 (eight) hours. 05/16/23   Roxy Horseman, PA-C  diazepam (VALIUM) 2 MG tablet Take 1 tablet (2 mg total) by mouth every 12 (twelve) hours as needed for anxiety. 10/12/22   Roxy Horseman, PA-C  ezetimibe (ZETIA) 10 MG tablet Take 10 mg by mouth daily.    [provider]  Ibuprofen-Acetaminophen (ADVIL DUAL ACTION) 125-250 MG TABS Take 1-2 tablets by mouth every 6 (six) hours as needed (pain).    [provider]  losartan (COZAAR) 100 MG tablet Take 100 mg by mouth at bedtime.    [provider]  meloxicam (MOBIC) 15 MG tablet Take 1 tablet (15 mg total) by mouth daily. 06/09/23   Darrick Grinder, PA-C  methocarbamol (ROBAXIN) 500 MG tablet Take 1 tablet (500 mg total) by mouth 2 (two) times daily. 06/09/23   Darrick Grinder, PA-C  methylPREDNISolone (MEDROL DOSEPAK) 4 MG TBPK tablet Take as directed per package instructions 06/09/23   Darrick Grinder, PA-C  ondansetron (ZOFRAN) 4 MG tablet Take 1 tablet (4 mg total) by mouth every 8 (eight) hours as needed for nausea or vomiting. 06/09/23   Barrie Dunker B, PA-C  ondansetron (ZOFRAN-ODT) 4 MG disintegrating tablet Take 1 tablet (4 mg total) by mouth every 8 (eight) hours as needed for nausea or vomiting. 05/16/23   Roxy Horseman, PA-C  pantoprazole (PROTONIX) 20 MG tablet Take 20 mg by mouth at bedtime. 05/11/21   [provider]  rosuvastatin (CRESTOR) 20 MG tablet Take 20 mg by mouth at bedtime. 05/11/21   [provider]  sodium chloride (OCEAN) 0.65 % SOLN nasal spray Place 1 spray into both nostrils as needed for congestion. 05/16/23   Roxy Horseman, PA-C  fluticasone (FLONASE) 50 MCG/ACT nasal spray Place 2 sprays into both nostrils daily. Patient not taking: Reported on 12/10/2019 11/04/19 01/31/20  Myra Rude, MD  ipratropium-albuterol (DUONEB) 0.5-2.5 (3) MG/3ML SOLN Take 3 mLs by nebulization every 6 (six) hours as needed (Wheezing and SOB). Patient not taking: Reported on 12/25/2019 12/11/19 01/31/20  Marguerita Merles Latif, DO      Allergies    Lisinopril    Review of Systems   Review of Systems  All other systems reviewed and are negative.   Physical Exam Updated Vital Signs BP (!) 157/99 (BP Location: Left Arm)   Pulse (!) 108   Temp 98.9 F (37.2 C) (Oral)   Resp 17   Ht 5' (1.524 m)   Wt 77.1 kg   SpO2 93%   BMI 33.20 kg/m  Physical Exam Vitals and nursing note reviewed.  Constitutional:      General: She is not in acute distress.    Appearance: Normal appearance. She is well-developed.  HENT:     Head: Normocephalic and atraumatic.  Eyes:     Conjunctiva/sclera: Conjunctivae normal.     Pupils: Pupils are equal, round, and reactive to light.  Cardiovascular:     Rate and Rhythm: Normal rate and regular rhythm.     Heart sounds: Normal heart sounds.  Pulmonary:     Effort: Pulmonary effort is normal. No respiratory distress.     Breath sounds: Normal breath sounds.  Abdominal:     General: There is no distension.     Palpations: Abdomen is soft.     Tenderness: There is no abdominal tenderness.  Musculoskeletal:        General: No deformity. Normal range of motion.     Cervical back: Normal range of motion and neck supple.  Skin:    General: Skin is warm and dry.  Neurological:     General: No focal deficit present.     Mental Status: She is alert and oriented to person, place, and time.     GCS: GCS eye subscore is 4. GCS verbal subscore is 5. GCS  motor subscore is 6.     Cranial Nerves: No cranial nerve deficit or facial asymmetry.     Sensory: No sensory deficit.     Coordination: Romberg sign negative.     ED Results / Procedures / Treatments   Labs (all labs ordered are listed, but only abnormal results are displayed) Labs Reviewed - No data to display  EKG None  Radiology No results found.  Procedures Procedures    Medications Ordered in ED Medications  ketorolac (TORADOL) 15 MG/ML injection 15 mg (has no administration in time range)    ED Course/ Medical Decision Making/ A&P  Medical Decision Making Risk Prescription drug management.    Medical Screen Complete  This patient presented to the ED with complaint of intermittent headache, left shoulder pain.  This complaint involves an extensive number of treatment options. The initial differential diagnosis includes, but is not limited to, concern regarding prior medical history  This presentation is: Chronic, Self-Limited, Previously Undiagnosed, Uncertain Prognosis, Complicated, Systemic Symptoms, and Threat to Life/Bodily Function  Patient with prior history of cerebral aneurysm.  This was treated by Dr. Conchita Paris with coiling.  Patient reports subsequent anxiety regarding the possibility of bleeding aneurysm.  She is without acute headache or other neurologic symptoms.  She is primarily asking for interpretation of imaging obtained at [facility] on the 11th of this month.  She apparently presented there with similar complaints and received a head CT.  She left prior to completion of evaluation and apparently did not receive any discussion or explanation of her results.  I was able to sit in the patient's room and go through her MyChart and explained the results of her imaging from the recent visit at [facility].  She reports that this made her much reassured.  She is encouraged to closely follow-up with Dr.  Conchita Paris.  She is encouraged to closely follow-up with orthopedics regarding her chronic left shoulder pain.  Patient is without indication for additional acute intervention or evaluation.  She is reassured after explanation of recent imaging as obtained at [facility].  Importance of close follow-up is stressed.  Strict return precautions given and understood.  Co morbidities that complicated the patient's evaluation  See HPI   Additional history obtained:  External records from outside sources obtained and reviewed including prior ED visits and prior Inpatient records.   Problem List / ED Course:  Chronic left shoulder pain   Reevaluation:  After the interventions noted above, I reevaluated the patient and found that they have: resolved  Disposition:  After consideration of the diagnostic results and the patients response to treatment, I feel that the patent would benefit from close outpatient follow-up.          Final Clinical Impression(s) / ED Diagnoses Final diagnoses:  Chronic left shoulder pain    Rx / DC Orders ED Discharge Orders     None         Wynetta Fines, MD 08/11/23 2232

## 2023-08-11 NOTE — ED Triage Notes (Signed)
 Patient reports having 2 head aneurysms and a headache that is not helped by pain medication. She also reports pain in her left shoulder. She has muscle relaxers for this, but they make her feel nauseous.

## 2024-02-19 ENCOUNTER — Emergency Department (HOSPITAL_COMMUNITY)
Admission: EM | Admit: 2024-02-19 | Discharge: 2024-02-20 | Disposition: A | Attending: Emergency Medicine | Admitting: Emergency Medicine

## 2024-02-19 ENCOUNTER — Emergency Department (HOSPITAL_COMMUNITY)

## 2024-02-19 DIAGNOSIS — R519 Headache, unspecified: Secondary | ICD-10-CM | POA: Insufficient documentation

## 2024-02-19 DIAGNOSIS — D72829 Elevated white blood cell count, unspecified: Secondary | ICD-10-CM | POA: Diagnosis not present

## 2024-02-19 DIAGNOSIS — I10 Essential (primary) hypertension: Secondary | ICD-10-CM | POA: Insufficient documentation

## 2024-02-19 DIAGNOSIS — R21 Rash and other nonspecific skin eruption: Secondary | ICD-10-CM | POA: Diagnosis not present

## 2024-02-19 DIAGNOSIS — L539 Erythematous condition, unspecified: Secondary | ICD-10-CM | POA: Insufficient documentation

## 2024-02-19 DIAGNOSIS — Z79899 Other long term (current) drug therapy: Secondary | ICD-10-CM | POA: Insufficient documentation

## 2024-02-19 DIAGNOSIS — F17200 Nicotine dependence, unspecified, uncomplicated: Secondary | ICD-10-CM | POA: Insufficient documentation

## 2024-02-19 LAB — COMPREHENSIVE METABOLIC PANEL WITH GFR
ALT: 26 U/L (ref 0–44)
AST: 34 U/L (ref 15–41)
Albumin: 4.5 g/dL (ref 3.5–5.0)
Alkaline Phosphatase: 88 U/L (ref 38–126)
Anion gap: 11 (ref 5–15)
BUN: 13 mg/dL (ref 8–23)
CO2: 23 mmol/L (ref 22–32)
Calcium: 9.6 mg/dL (ref 8.9–10.3)
Chloride: 107 mmol/L (ref 98–111)
Creatinine, Ser: 0.79 mg/dL (ref 0.44–1.00)
GFR, Estimated: >60 mL/min (ref >60)
Glucose, Bld: 118 mg/dL — ABNORMAL HIGH (ref 70–99)
Potassium: 4.6 mmol/L (ref 3.5–5.1)
Sodium: 141 mmol/L (ref 135–145)
Total Bilirubin: 0.3 mg/dL (ref 0.0–1.2)
Total Protein: 7.2 g/dL (ref 6.5–8.1)

## 2024-02-19 LAB — CBC
HCT: 43.9 % (ref 36.0–46.0)
Hemoglobin: 13.6 g/dL (ref 12.0–15.0)
MCH: 29.1 pg (ref 26.0–34.0)
MCHC: 31 g/dL (ref 30.0–36.0)
MCV: 93.8 fL (ref 80.0–100.0)
Platelets: 305 K/uL (ref 150–400)
RBC: 4.68 MIL/uL (ref 3.87–5.11)
RDW: 12.7 % (ref 11.5–15.5)
WBC: 13.5 K/uL — ABNORMAL HIGH (ref 4.0–10.5)
nRBC: 0 % (ref 0.0–0.2)

## 2024-02-19 NOTE — ED Triage Notes (Signed)
 Pt states that she has been having headaches over the past two days, hx of brain aneurysm x 2, had surgery on one with coiling but they are watching the other.

## 2024-02-20 DIAGNOSIS — R519 Headache, unspecified: Secondary | ICD-10-CM | POA: Diagnosis not present

## 2024-02-20 MED ORDER — DIPHENHYDRAMINE-ZINC ACETATE 2-0.1 % EX CREA
TOPICAL_CREAM | Freq: Once | CUTANEOUS | Status: AC
  Filled 2024-02-20: qty 28

## 2024-02-20 NOTE — Discharge Instructions (Signed)
 Your workup this morning was reassuring.  If you develop severe sudden onset headache or other life-threatening symptoms return to the emergency department.  As to the rash under your left arm, I recommend either oral antihistamines such as Zyrtec or Benadryl , or trying a topical antihistamine such as a Benadryl  cream.  Please follow-up with your primary care provider for further evaluation.

## 2024-02-20 NOTE — ED Provider Notes (Signed)
 [place] EMERGENCY DEPARTMENT AT [facility] Provider Note   CSN: [id number] Arrival date & time: 02/19/24  2013     Patient presents with: Headache   Ashley Hodge is a 63 y.o. female.  Patient with history of chronic headaches, cerebral aneurysm status post coiling, hypertension, anxiety presents to the emergency department complaining of headaches over the past 2 days.  She states that when she gets headaches she has been told to go to the emergency department for imaging.  At the time of my assessment the patient has been resting while waiting for a room and is currently asymptomatic.  She no longer has any headache and is resting comfortably.  Her chief complaint at this time is an itchy area of skin under the left armpit which she believes may be due to an insect bite.  She denies other systemic symptoms.    Headache      Prior to Admission medications   Medication Sig Start Date End Date Taking? Authorizing Provider  albuterol  (VENTOLIN  HFA) 108 (90 Base) MCG/ACT inhaler Inhale 1-2 puffs into the lungs every 6 (six) hours as needed for wheezing or shortness of breath.    [provider]  albuterol  (VENTOLIN  HFA) 108 (90 Base) MCG/ACT inhaler Inhale 2 puffs into the lungs every 4 (four) hours as needed for wheezing or shortness of breath. 05/16/23   Vicky Charleston, PA-C  azithromycin  (ZITHROMAX ) 250 MG tablet Take 1 tablet (250 mg total) by mouth daily. Take first 2 tablets together, then 1 every day until finished. 05/16/23   Vicky Charleston, PA-C  BELSOMRA  10 MG TABS Take 1 tablet by mouth at bedtime. 06/07/22   [provider]  benzonatate  (TESSALON ) 100 MG capsule Take 1 capsule (100 mg total) by mouth every 8 (eight) hours. 05/16/23   Vicky Charleston, PA-C  diazepam  (VALIUM ) 2 MG tablet Take 1 tablet (2 mg total) by mouth every 12 (twelve) hours as needed for anxiety. 10/12/22   Vicky Charleston, PA-C  ezetimibe  (ZETIA ) 10 MG tablet Take 10 mg  by mouth daily.    [provider]  Ibuprofen -Acetaminophen  (ADVIL  DUAL ACTION) 125-250 MG TABS Take 1-2 tablets by mouth every 6 (six) hours as needed (pain).    [provider]  losartan  (COZAAR ) 100 MG tablet Take 100 mg by mouth at bedtime.    [provider]  meloxicam  (MOBIC ) 15 MG tablet Take 1 tablet (15 mg total) by mouth daily. 06/09/23   Logan Ubaldo NOVAK, PA-C  methocarbamol  (ROBAXIN ) 500 MG tablet Take 1 tablet (500 mg total) by mouth 2 (two) times daily. 06/09/23   Logan Ubaldo NOVAK, PA-C  methylPREDNISolone  (MEDROL  DOSEPAK) 4 MG TBPK tablet Take as directed per package instructions 06/09/23   Logan Ubaldo B, PA-C  ondansetron  (ZOFRAN ) 4 MG tablet Take 1 tablet (4 mg total) by mouth every 8 (eight) hours as needed for nausea or vomiting. 06/09/23   Logan Ubaldo B, PA-C  ondansetron  (ZOFRAN ) 4 MG tablet Take 1 tablet (4 mg total) by mouth every 6 (six) hours. 08/11/23   Laurice Maude BROCKS, MD  ondansetron  (ZOFRAN -ODT) 4 MG disintegrating tablet Take 1 tablet (4 mg total) by mouth every 8 (eight) hours as needed for nausea or vomiting. 05/16/23   Vicky Charleston, PA-C  pantoprazole  (PROTONIX ) 20 MG tablet Take 20 mg by mouth at bedtime. 05/11/21   [provider]  rosuvastatin  (CRESTOR ) 20 MG tablet Take 20 mg by mouth at bedtime. 05/11/21   [provider]  sodium chloride  (OCEAN) 0.65 % SOLN nasal spray Place 1 spray into both nostrils as needed for congestion. 05/16/23   Vicky Charleston, PA-C  fluticasone  (FLONASE ) 50 MCG/ACT nasal spray Place 2 sprays into both nostrils daily. Patient not taking: Reported on 12/10/2019 11/04/19 01/31/20  Chick Venetia BRAVO, MD  ipratropium-albuterol  (DUONEB) 0.5-2.5 (3) MG/3ML SOLN Take 3 mLs by nebulization every 6 (six) hours as needed (Wheezing and SOB). Patient not taking: Reported on 12/25/2019 12/11/19 01/31/20  Sherrill Cable Latif, DO    Allergies: Lisinopril    Review of Systems  Neurological:   Positive for headaches.    Updated Vital Signs BP 131/74 (BP Location: Left Arm)   Pulse 74   Temp 98 F (36.7 C) (Oral)   Resp 17   SpO2 93%   Physical Exam Vitals and nursing note reviewed.  Constitutional:      General: She is not in acute distress.    Appearance: She is well-developed.  HENT:     Head: Normocephalic and atraumatic.  Eyes:     Conjunctiva/sclera: Conjunctivae normal.     Pupils: Pupils are equal, round, and reactive to light.  Cardiovascular:     Rate and Rhythm: Normal rate and regular rhythm.     Heart sounds: No murmur heard. Pulmonary:     Effort: Pulmonary effort is normal. No respiratory distress.     Breath sounds: Normal breath sounds.  Abdominal:     Palpations: Abdomen is soft.     Tenderness: There is no abdominal tenderness.  Musculoskeletal:        General: No swelling.     Cervical back: Neck supple.  Skin:    General: Skin is warm and dry.     Capillary Refill: Capillary refill takes less than 2 seconds.     Comments: Excoriated area to the anterior left axillary region with some mild erythema.  No drainage.  No induration or fluctuance.  Neurological:     Mental Status: She is alert. Mental status is at baseline.     Cranial Nerves: No cranial nerve deficit.  Psychiatric:        Mood and Affect: Mood normal.     (all labs ordered are listed, but only abnormal results are displayed) Labs Reviewed  CBC - Abnormal; Notable for the following components:      Result Value   WBC 13.5 (*)    All other components within normal limits  COMPREHENSIVE METABOLIC PANEL WITH GFR - Abnormal; Notable for the following components:   Glucose, Bld 118 (*)    All other components within normal limits    EKG: None  Radiology: CT Head Wo Contrast Result Date: 02/19/2024 EXAM: CT HEAD WITHOUT CONTRAST 02/19/2024 08:52:19 PM TECHNIQUE: CT of the head was performed without the administration of intravenous contrast. Automated exposure control,  iterative reconstruction, and/or weight based adjustment of the mA/kV was utilized to reduce the radiation dose to as low as reasonably achievable. COMPARISON: None available. CLINICAL HISTORY: Headache, new onset (Age >= 51y). The patient has a history of brain aneurysm x 2, had surgery on one with coiling but they are watching the other. The patient states that she has been having headaches over the past two days. FINDINGS: BRAIN AND VENTRICLES: No acute hemorrhage. No evidence of acute infarct. No hydrocephalus. No extra-axial collection. No mass effect or midline shift. An aneurysm coil is seen in the expected location of the left MCA origin. ORBITS: No acute abnormality. SINUSES: No acute abnormality. SOFT  TISSUES AND SKULL: No acute soft tissue abnormality. No skull fracture. IMPRESSION: 1. No acute intracranial abnormality. 2. Aneurysm coils in the expected location of the left MCA origin. Electronically signed by: Dorethia Molt MD 02/19/2024 09:05 PM EDT RP Workstation: [id number]     Procedures   Medications Ordered in the ED  diphenhydrAMINE -zinc  acetate (BENADRYL ) 2-0.1 % cream ( Topical Given 02/20/24 0610)                                    Medical Decision Making Amount and/or Complexity of Data Reviewed Labs: ordered. Radiology: ordered.  Risk OTC drugs.   This patient presents to the ED for concern of headache, this involves an extensive number of treatment options, and is a complaint that carries with it a high risk of complications and morbidity.  The differential diagnosis includes tension headache, cluster headache, migraine, intracranial abnormality, others   Co morbidities / Chronic conditions that complicate the patient evaluation  History of aneurysm repaired with coiling   Additional history obtained:  Additional history obtained from EMR External records from outside source obtained and reviewed including outside emergency department notes   Lab  Tests:  I Ordered, and personally interpreted labs.  The pertinent results include: White count of 13,500 of unclear significance   Imaging Studies ordered:  I ordered imaging studies including head CT I independently visualized and interpreted imaging which showed  1. No acute intracranial abnormality.  2. Aneurysm coils in the expected location of the left MCA origin   I agree with the radiologist interpretation   Problem List / ED Course / Critical interventions / Medication management   I ordered medication including Benadryl  cream Reevaluation of the patient after these medicines showed that the patient improved I have reviewed the patients home medicines and have made adjustments as needed  Social Determinants of Health:  Patient is a daily smoker   Test / Admission - Considered:  Patient currently has no headache.  CT unremarkable.  If patient had continued to have a severe headache I would have considered CT angio of the head and neck but with no symptoms I do not see an indication at this time.  No indication for further emergent workup/admission related to patient's headache. Patient complains of a irritated area in the left axillary region.  No sign of abscess or cellulitis at this time.  There does appear to be what may have been an insect bite with some excoriated red tissue around it.  The patient is actively scratching the area at the time of my assessment.  Benadryl  cream seem to help the irritation.  Suspect localized dermatitis/allergic reaction.  Patient advised to use antihistamine cream/oral medication and to follow-up with primary care.  No indication for further emergent workup or admission at this time.  Patient appears stable for discharge home.      Final diagnoses:  Nonintractable headache, unspecified chronicity pattern, unspecified headache type  Rash    ED Discharge Orders     None          Logan Ubaldo KATHEE DEVONNA 02/20/24 [id number]     Trine Raynell Moder, MD 02/20/24 [phone]
